# Patient Record
Sex: Male | Born: 1945 | Race: White | Hispanic: No | Marital: Married | Smoking: Never smoker
Health system: Southern US, Community
[De-identification: ages and names within clinical notes are randomized; demographics above are authoritative.]

## PROBLEM LIST (undated history)

## (undated) DIAGNOSIS — I4891 Unspecified atrial fibrillation: Secondary | ICD-10-CM

## (undated) DIAGNOSIS — C801 Malignant (primary) neoplasm, unspecified: Secondary | ICD-10-CM

## (undated) DIAGNOSIS — I10 Essential (primary) hypertension: Secondary | ICD-10-CM

## (undated) DIAGNOSIS — R519 Headache, unspecified: Secondary | ICD-10-CM

## (undated) DIAGNOSIS — F419 Anxiety disorder, unspecified: Secondary | ICD-10-CM

## (undated) DIAGNOSIS — E7800 Pure hypercholesterolemia, unspecified: Secondary | ICD-10-CM

## (undated) DIAGNOSIS — R079 Chest pain, unspecified: Secondary | ICD-10-CM

## (undated) DIAGNOSIS — K219 Gastro-esophageal reflux disease without esophagitis: Secondary | ICD-10-CM

## (undated) DIAGNOSIS — R51 Headache: Secondary | ICD-10-CM

## (undated) DIAGNOSIS — A048 Other specified bacterial intestinal infections: Secondary | ICD-10-CM

## (undated) DIAGNOSIS — R002 Palpitations: Secondary | ICD-10-CM

## (undated) DIAGNOSIS — E785 Hyperlipidemia, unspecified: Secondary | ICD-10-CM

## (undated) DIAGNOSIS — Z86010 Personal history of colonic polyps: Secondary | ICD-10-CM

## (undated) DIAGNOSIS — E871 Hypo-osmolality and hyponatremia: Secondary | ICD-10-CM

## (undated) HISTORY — PX: BACK SURGERY: SHX140

## (undated) HISTORY — DX: Palpitations: R00.2

## (undated) HISTORY — DX: Other specified bacterial intestinal infections: A04.8

## (undated) HISTORY — DX: Personal history of colonic polyps: Z86.010

## (undated) HISTORY — DX: Malignant (primary) neoplasm, unspecified: C80.1

## (undated) HISTORY — PX: EYE SURGERY: SHX253

## (undated) HISTORY — DX: Gastro-esophageal reflux disease without esophagitis: K21.9

## (undated) HISTORY — DX: Chest pain, unspecified: R07.9

## (undated) HISTORY — DX: Anxiety disorder, unspecified: F41.9

## (undated) HISTORY — DX: Hyperlipidemia, unspecified: E78.5

## (undated) SURGERY — Surgical Case
Anesthesia: *Unknown

---

## 1998-05-14 ENCOUNTER — Inpatient Hospital Stay (HOSPITAL_COMMUNITY): Admission: EM | Admit: 1998-05-14 | Discharge: 1998-05-16 | Payer: Self-pay | Admitting: Internal Medicine

## 1998-05-16 ENCOUNTER — Encounter: Payer: Self-pay | Admitting: Internal Medicine

## 2001-01-31 ENCOUNTER — Ambulatory Visit (HOSPITAL_COMMUNITY): Admission: RE | Admit: 2001-01-31 | Discharge: 2001-01-31 | Payer: Self-pay | Admitting: Otolaryngology

## 2001-01-31 ENCOUNTER — Encounter: Payer: Self-pay | Admitting: Otolaryngology

## 2003-12-01 ENCOUNTER — Ambulatory Visit (HOSPITAL_COMMUNITY): Admission: RE | Admit: 2003-12-01 | Discharge: 2003-12-01 | Payer: Self-pay | Admitting: Internal Medicine

## 2004-01-24 ENCOUNTER — Ambulatory Visit: Payer: Self-pay | Admitting: Internal Medicine

## 2005-01-23 ENCOUNTER — Ambulatory Visit (HOSPITAL_COMMUNITY): Admission: RE | Admit: 2005-01-23 | Discharge: 2005-01-23 | Payer: Self-pay | Admitting: Otolaryngology

## 2005-03-19 HISTORY — PX: LUMBAR DISC SURGERY: SHX700

## 2005-03-20 ENCOUNTER — Ambulatory Visit: Payer: Self-pay | Admitting: Internal Medicine

## 2005-04-27 ENCOUNTER — Encounter (INDEPENDENT_AMBULATORY_CARE_PROVIDER_SITE_OTHER): Payer: Self-pay | Admitting: Internal Medicine

## 2005-04-27 ENCOUNTER — Ambulatory Visit (HOSPITAL_COMMUNITY): Admission: RE | Admit: 2005-04-27 | Discharge: 2005-04-27 | Payer: Self-pay | Admitting: Internal Medicine

## 2005-04-27 ENCOUNTER — Ambulatory Visit: Payer: Self-pay | Admitting: Internal Medicine

## 2005-04-27 DIAGNOSIS — Z860100 Personal history of colon polyps, unspecified: Secondary | ICD-10-CM

## 2005-04-27 HISTORY — DX: Personal history of colonic polyps: Z86.010

## 2005-04-27 HISTORY — DX: Personal history of colon polyps, unspecified: Z86.0100

## 2005-07-03 ENCOUNTER — Emergency Department (HOSPITAL_COMMUNITY): Admission: EM | Admit: 2005-07-03 | Discharge: 2005-07-03 | Payer: Self-pay | Admitting: Emergency Medicine

## 2005-07-10 ENCOUNTER — Ambulatory Visit (HOSPITAL_COMMUNITY): Admission: RE | Admit: 2005-07-10 | Discharge: 2005-07-10 | Payer: Self-pay | Admitting: Unknown Physician Specialty

## 2005-07-23 ENCOUNTER — Inpatient Hospital Stay (HOSPITAL_COMMUNITY): Admission: EM | Admit: 2005-07-23 | Discharge: 2005-07-27 | Payer: Self-pay | Admitting: Emergency Medicine

## 2005-08-11 ENCOUNTER — Emergency Department (HOSPITAL_COMMUNITY): Admission: EM | Admit: 2005-08-11 | Discharge: 2005-08-11 | Payer: Self-pay | Admitting: Emergency Medicine

## 2005-08-20 ENCOUNTER — Ambulatory Visit (HOSPITAL_COMMUNITY): Admission: RE | Admit: 2005-08-20 | Discharge: 2005-08-21 | Payer: Self-pay | Admitting: Neurosurgery

## 2005-09-26 ENCOUNTER — Encounter (HOSPITAL_COMMUNITY): Admission: RE | Admit: 2005-09-26 | Discharge: 2005-10-26 | Payer: Self-pay | Admitting: Neurosurgery

## 2005-10-24 ENCOUNTER — Ambulatory Visit (HOSPITAL_COMMUNITY): Admission: RE | Admit: 2005-10-24 | Discharge: 2005-10-24 | Payer: Self-pay | Admitting: Urology

## 2005-11-01 ENCOUNTER — Encounter (HOSPITAL_COMMUNITY): Admission: RE | Admit: 2005-11-01 | Discharge: 2005-12-01 | Payer: Self-pay | Admitting: Neurosurgery

## 2006-10-17 ENCOUNTER — Ambulatory Visit (HOSPITAL_COMMUNITY): Admission: RE | Admit: 2006-10-17 | Discharge: 2006-10-17 | Payer: Self-pay | Admitting: Internal Medicine

## 2007-04-28 LAB — HM COLONOSCOPY

## 2008-11-23 DIAGNOSIS — Z8719 Personal history of other diseases of the digestive system: Secondary | ICD-10-CM | POA: Insufficient documentation

## 2008-11-23 DIAGNOSIS — R1013 Epigastric pain: Secondary | ICD-10-CM | POA: Insufficient documentation

## 2008-11-23 DIAGNOSIS — D126 Benign neoplasm of colon, unspecified: Secondary | ICD-10-CM | POA: Insufficient documentation

## 2008-11-23 DIAGNOSIS — K3189 Other diseases of stomach and duodenum: Secondary | ICD-10-CM | POA: Insufficient documentation

## 2008-11-23 DIAGNOSIS — R630 Anorexia: Secondary | ICD-10-CM | POA: Insufficient documentation

## 2008-11-23 DIAGNOSIS — K589 Irritable bowel syndrome without diarrhea: Secondary | ICD-10-CM | POA: Insufficient documentation

## 2008-11-24 ENCOUNTER — Ambulatory Visit: Payer: Self-pay | Admitting: Gastroenterology

## 2008-11-25 ENCOUNTER — Encounter: Payer: Self-pay | Admitting: Gastroenterology

## 2009-01-25 ENCOUNTER — Encounter: Payer: Self-pay | Admitting: Gastroenterology

## 2009-02-19 ENCOUNTER — Encounter: Admission: RE | Admit: 2009-02-19 | Discharge: 2009-02-19 | Payer: Self-pay | Admitting: Neurosurgery

## 2009-08-09 ENCOUNTER — Ambulatory Visit (HOSPITAL_COMMUNITY): Admission: RE | Admit: 2009-08-09 | Discharge: 2009-08-09 | Payer: Self-pay | Admitting: Urology

## 2010-04-09 ENCOUNTER — Encounter: Payer: Self-pay | Admitting: Urology

## 2010-04-10 ENCOUNTER — Encounter: Payer: Self-pay | Admitting: Urology

## 2010-05-05 ENCOUNTER — Encounter (INDEPENDENT_AMBULATORY_CARE_PROVIDER_SITE_OTHER): Payer: Self-pay

## 2010-05-10 NOTE — Letter (Signed)
Summary: Recall Colonoscopy/Endoscopy, Change to Office Visit  [facility]  [address]   [place], [place] [address]   Phone: [phone]  Fax: [phone]      May 05, 2010   [facility] [address] [place], [place]  [address] Jun 21, 1945   Dear Mr. Brazel,   According to our records, it is time for you to schedule a Colonoscopy/Endoscopy. However, after reviewing your medical record, we recommend an office visit in order to determine your need for a repeat procedure.  Please call [phone] at your convenience to schedule an office visit. If you have any questions or concerns, please feel free to contact our office.   Sincerely,   Cloria Spring LPN  [facility] Ph: [phone]   Fax: [phone]

## 2010-08-04 NOTE — Op Note (Signed)
[name], [name]                 ACCOUNT NO.:  [id number]   MEDICAL RECORD NO.:  [id number]          PATIENT TYPE:  AMB   LOCATION:  DAY                           FACILITY:  [facility]   PHYSICIAN:  Lionel December, M.D.    DATE OF BIRTH:  May 10, 1945   DATE OF PROCEDURE:  04/27/2005  DATE OF DISCHARGE:                                 OPERATIVE REPORT   PROCEDURE:  Colonoscopy with polypectomy.   INDICATIONS:  [name] is a 65 year old Caucasian male who is undergoing  colonoscopy for surveillance purposes. He is presently free of GI symptoms.  His last exam was six years ago. He had a few small polyps. The ones that  were removed were hyperplastic. Histology was not available on the rest. He  also has history of testicular carcinoma. Procedure and risks were reviewed  with the patient, and informed consent was obtained.   MEDICINES FOR CONSCIOUS SEDATION:  Demerol 50 mg IV, Versed 6 mg IV.   FINDINGS:  Procedure performed in endoscopy suite. The patient's vital signs  and O2 saturations were monitored during procedure and remained stable. The  patient was placed in the left lateral position and rectal examination  performed. No abnormality noted on external or digital exam. Olympus video  scope was placed in the rectum and advanced into sigmoid colon and beyond.  Preparation was excellent. Scope was advanced to the cecum which was  identified by appendiceal orifice and ileocecal valve. There was a 4-mm  polyp to the right of appendiceal orifice which was ablated by cold biopsy.  As the scope was withdrawn, colonic mucosa was carefully examined. There was  another 6- to 7-mm polyp at distal sigmoid colon which was snared and  retrieved. The mucosal of the rest of the colon was normal. The rectal  mucosa similarly was normal. Scope was retroflexed to examine anorectal  junction, and small hemorrhoids were noted below the dentate line. Endoscope  was straightened and withdrawn. The patient  tolerated the procedure well.   FINAL DIAGNOSES:  1.  Small cecal polyp ablated via cold biopsy.  2.  A 6- to 7-mm polyp snared from sigmoid colon. Both of these polyps      appeared to be edematous.  3.  External hemorrhoids.   RECOMMENDATIONS:  Standard instructions given. I will be contacting the  patient with biopsy results and further recommendations.      Lionel December, M.D.  Electronically Signed     NR/MEDQ  D:  04/27/2005  T:  04/27/2005  Job:  [id number]   cc:   Doreen Beam  Fax: [phone]

## 2010-08-04 NOTE — Op Note (Signed)
[name], [name]                           ACCOUNT NO.:  [id number]   MEDICAL RECORD NO.:  [id number]                   PATIENT TYPE:  AMB   LOCATION:  DAY                                  FACILITY[facility]  [facility]   PHYSICIAN:  Lionel December, M.D.                 DATE OF BIRTH:  02-13-1946   DATE OF PROCEDURE:  12/01/2003  DATE OF DISCHARGE:                                 OPERATIVE REPORT   PROCEDURE:  Esophagogastroduodenoscopy.   INDICATIONS:  Benett is a 65 year old Caucasian male with recurrent  epigastric pain. He has been seen in the office twice this year. He had  normal LFTs and upper abdominal ultrasound. He has been treated with PPI and  antispasmodic with questionable results. Lately, he has been having more  pain and soreness and bloating. He therefore undergoing diagnostic EGD.  Procedure risks were reviewed with the patient and informed consent was  obtained.   PREOPERATIVE MEDICATIONS:  Cetacaine spray for pharyngeal topical  anesthesia, Demerol 50 mg IV, Versed 6 mg in divided doses.   FINDINGS:  Procedure performed in endoscopy suite. The patient's vital signs  and O2 saturations were monitored during the procedure and remained stable.  The patient was placed in left lateral position, and Olympus video scope was  passed via oropharynx without any difficulty into the esophagus.   Esophagus:  Mucosa of the esophagus was normal throughout. GE junction was  unremarkable. No hernia was noted.   Stomach:  It was empty and distended very well with insufflation. Folds of  the proximal stomach were normal. Some focal erythema at gastric body but no  erosions or ulcers noted. Antral mucosa was normal. Pyloric channel was  patent. Angularis, fundus and cardia were examined by retroflexing the scope  and were normal.   Duodenum:  Reached the bulb, and post bulbar duodenum was normal. Mucosa and  folds in the second part of the duodenum were normal. Endoscope was  withdrawn. The  patient tolerated the procedure well.   FINAL DIAGNOSIS:  Focal erythema at gastric body, otherwise normal  examination. 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 Please note his  helicobacter pylori serology was negative in October 2003 and will not be  repeated.   RECOMMENDATIONS:  1.  Trial with Librax 1 p.o. t.i.d. p.r.n. If this therapy does not help,      may consider anxiolytic therapy on scheduled basis rather than p.r.n.      use. Prescription given for Librax for 40 with 1 refill.  2.  He will return for OV in 2 months from now.      ___________________________________________                                            Lionel December, M.D.   NR/MEDQ  D:  12/01/2003  T:  12/01/2003  Job:  [id number]   cc:   Doreen Beam  [address]  [place]  [place] [address]  Fax: [phone]   Rito Ehrlich, M.D.  [place], [place]

## 2010-08-04 NOTE — H&P (Signed)
NAMESONG, [name]                 ACCOUNT NO.:  [id number]   MEDICAL RECORD NO.:  [id number]          PATIENT TYPE:  AMB   LOCATION:                                FAC[facility]:  [facility]   PHYSICIAN:  Lionel December, M.D.    DATE OF BIRTH:  [birth year]   DATE OF ADMISSION:  DATE OF DISCHARGE:  LH                                HISTORY & PHYSICAL   PRIMARY CARE PHYSICIAN:  Dr. Sherril Croon.   CHIEF COMPLAINT:  Annual followup, due for colonoscopy.   HISTORY OF PRESENT ILLNESS:  [name] is a 65 year old Caucasian male who  we have seen for chronic dyspepsia.  He has a history of testicular  seminoma.  He had colonoscopy by Dr. Karilyn Cota on April 12, 1999; he had  external hemorrhoids, tiny diverticula in the sigmoid colon and a lipoma at  the hepatic flexure and 4 small polyps which were hyperplastic.  Due to his  past history and the fact that one of the hyperplastic polyps had an  ulcerative surface, Dr. Karilyn Cota recommended a colonoscopy in 2006.  [name]  continues to do well.  His weight has remained stable.  He has a good  appetite.  He denies any early satiety, denies any dysphagia or odynophagia.  He does have rare heartburn and indigestion about once every 2-3 months.  He  has a history of IBS.  His bowel movements have been normal, soft and brown,  without any rectal bleeding or melena.  He denies any diarrhea or  constipation.  He does take an occasional Protonix 40 mg daily, but very  rarely, a couple of times a year.  He notes certain foods make his dyspepsia  worse, including orange juice and caffeine.  He had physical exam by Dr.  Sherril Croon about a year ago.  He is followed by Dr. Rito Parker for his history of  testicular carcinoma.   CURRENT MEDICATIONS:  1.  Lipitor 10 mg daily.  2.  Protonix 40 mg daily.  3.  A blood pressure medicines (he is going to call back with the name and      dose).  4.  Questionable heart medication -- patient to call with name and dose.   ALLERGIES:   PREVACID, which caused oral swelling; VALIUM, which caused  hyperactivity.   PAST MEDICAL HISTORY:  1.  Hypercholesterolemia.  2.  History of IBS.  3.  History of chronic dyspepsia.  4.  History of left orchiectomy, followed by radiation therapy for a      testicular seminoma and he has remained in remission.  He is followed by      Dr. Rito Parker.  5.  He also has a history of gynecomastia due to one of his medications.  He      underwent bilateral mastectomy by Dr. Lovell Sheehan.  6.  He has a history of palpitations and tachycardia for which he has      recently been started on a new medication by Dr. Sherril Croon; the patient does      not recall the name of this.  FAMILY HISTORY:  There is no known family history of colorectal carcinoma or  other chronic GI problems.   SOCIAL HISTORY:  [name] is married.  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  He has a 15-pack-year history of tobacco use,  quitting about 25 years ago.  He denies any drug or alcohol use.   REVIEW OF SYSTEMS:  CONSTITUTIONAL:  See HPI.  CARDIOVASCULAR:  Denies any  chest pain.  He has had a recent history of palpitations and is followed by  Dr. Sherril Croon for this.  Denies any diaphoresis or chest pain with this.  CARDIOVASCULAR:  Denies any shortness of breath, dyspnea, cough or  hemoptysis.  GI:  See HPI.   PHYSICAL EXAMINATION:  VITAL SIGNS:  Weight 155 pounds, height 68 inches,  temperature 97.8, blood pressure 146/84 and pulse 76.  GENERAL:  [name] is a thin Caucasian male who is alert, oriented and  pleasant, cooperative and somewhat anxious-appearing and he is in no acute  distress.  HEENT:  Sclerae are clear not injected.  Conjunctivae are pink.  Oropharynx  pink and moist without any lesions.  NECK:  Supple without mass or thyromegaly.  HEART:  Regular rate and rhythm.  Normal S1 and S2.  Without murmurs, rubs,  thrills or gallops.  LUNGS:  Clear to auscultation bilaterally.  ABDOMEN:  Flat  with positive bowel sounds x4.  No bruits auscultated.  Soft,  nontender and non-distended without palpable mass or hepatosplenomegaly.  No  rebound tenderness or guarding.  EXTREMITIES:  Without clubbing or edema bilaterally.  SKIN:  Pink, warm and dry without any rash or jaundice.   IMPRESSION:  [name] is a 65 year old Caucasian male with history of  testicular seminoma.  He also has a history of hyperplastic polyps, one with  an ulcerative surface on colonoscopy back in 2001.  It was recommended by  Dr. Karilyn Cota that he have a colonoscopy last year; however, [name] has not  gotten around to this.  He is going to need screening colonoscopy soon.   His chronic dyspepsia is well-controlled, as is his irritable bowel syndrome  at this time.   PLAN:  1.  He is going to call to schedule colonoscopy with Dr. Karilyn Cota in the near      future.  I have discussed this procedure including risks and benefits to      include, but not limited to, bleeding, infection, perforation, drug      reaction.  He agrees to plan and consent will be obtained.  2.  GERD literature was given for his review.  3.  He is due for annual physical exam with Dr. Sherril Croon and he is going to      schedule this.  4.  He can continue Protonix 40 mg on a p.r.n. basis.      Nicholas Lose, N.P.      Lionel December, M.D.  Electronically Signed    KC/MEDQ  D:  03/20/2005  T:  03/20/2005  Job:  [id number]   cc:   Doreen Beam  Fax: [phone]

## 2010-08-04 NOTE — Discharge Summary (Signed)
[name], [name]                 ACCOUNT NO.:  [id number]   MEDICAL RECORD NO.:  [id number]          PATIENT TYPE:  INP   LOCATION:  A309                          FACILITY:  [facility]   PHYSICIAN:  Mobolaji B. Bakare, M.D.DATE OF BIRTH:  09-05-1945   DATE OF ADMISSION:  07/22/2005  DATE OF DISCHARGE:  07/27/2005                                 DISCHARGE SUMMARY   PRIMARY CARE PHYSICIAN:  Unassigned.   FINAL DIAGNOSES:  1.  Hyponatremia.  2.  Hypertension.  3.  Dehydration.  4.  Hypokalemia/hyperkalemia.  5.  Herniated disc, to follow up with neurosurgeon.   PROCEDURE:  1.  Head CT scan done on Jul 26, 2005, showed no acute intracranial      abnormalities, no evidence of metastasis.  2.  Chest CT scan which was done to follow up chest x-ray finding of      question of nodule in left upper lobe.  There was no nodule or      malignancy noted.  There is a vague lytic focus which was circumscribed,      sclerotic ring in the posterior left rib which corresponded to the chest      x-ray finding.  3.  Chest x-ray showed minimal bronchitic changes with questionable left      upper lobe nodular density.  This was followed with a CT scan of the      chest.   HISTORY OF PRESENTING COMPLAINT:  [name] is a 65 year old Caucasian male  who presented with lethargy and worsening and progressive weakness all over  the body for four weeks.  He had poor appetite.  This was all sequel to a  viral syndrome which presented with nausea and vomiting.  This resolved,  then patient became progressively lethargic.  He was noted in the emergency  room to have a sodium of 122.  There was no seizures and no focal weakness.  He was admitted into the intensive care unit and started on hypertonic  saline.  He was further evaluated for hyponatremia. Investigation so far did  not reveal any substantive etiology other than poor p.o. intake.  He had a  chest x-ray and CT scan which were unremarkable.  Head CT  scan was not  suggestive of brain metastasis.  Patient responded to normal saline IV fluid  and at the time of discharge, sodium was 138.   HYPERKALEMIA/HYPOKALEMIA:  He had episodes of hypokalemia and this was  repleted.  Subsequently he became hyperkalemic and IV fluid replacement with  potassium was discontinued.  Further potassium replacement was held.   LETHARGY AND WEAKNESS:  Patient was seen by physical therapist and he did  well with physical therapy.  He was able to ambulate with a cane.  There was  no further physical therapy needed at home.   HERNIATED DISC:  As part of his evaluation during previous hospital visit in  April 2007, he was noted to have herniated disc on left at level L4 to L5.  There was mild defect on L4 nerve root.  Symptomatically, patient had  numbness  at that time.  During this hospitalization, this was followed up.  He did not have any neurological weakness, no loss of sensation.  He did  have numbness and tingling sensation on the lateral aspect of his leg and  proximal dorsum of left foot.  Subsequent to physical therapy, patient  stated that this has improved.  Nevertheless, he was set up to follow up  with neurosurgery in [place], [place].   CONDITION ON DISCHARGE:  Stable and improved.   DISCHARGE MEDICATIONS:  1.  Lipitor 10 mg daily.  2.  Metoprolol 50 mg daily.  3.  Lisinopril 20 mg daily.  4.  Protonix 40 mg p.r.n.  5.  Diclofenac sodium 50 mg b.i.d.  6.  Darvocet N 100 p.o. p.r.n.   FOLLOW UP:  1.  Patient to follow up with PCP in one week and have BMET rechecked.  2.  Follow-up with neurosurgeon at [facility].  Telephone      number provided.   DISCHARGE LABORATORY DATA:  Sodium 128, BUN 5, creatinine 0.4, potassium 5.3  (patient's potassium replacement was discontinued).  Serum osmolality 283.  Urine osmolality 303.  Hemoglobin A1c 5.8.  Cortisone level 6.5.      Mobolaji B. Corky Downs, M.D.  Electronically  Signed     MBB/MEDQ  D:  07/29/2005  T:  07/30/2005  Job:  [id number]

## 2010-08-04 NOTE — H&P (Signed)
[name], [name]                 ACCOUNT NO.:  [id number]   MEDICAL RECORD NO.:  [id number]          PATIENT TYPE:  INP   LOCATION:  IC09                          FACILITY:  [facility]   PHYSICIAN:  Margaretmary Dys, M.D.DATE OF BIRTH:  [birth year]   DATE OF ADMISSION:  07/23/2005  DATE OF DISCHARGE:  LH                                HISTORY & PHYSICAL   ADMISSION DIAGNOSES:  1.  Severe hyponatremia.  2.  Generalized weakness.  3.  Post-viral syndrome.  4.  Severe dehydration.  5.  Poorly-controlled hypertension.   CHIEF COMPLAINT:  Progressive weakness of four weeks' duration.   HISTORY OF PRESENT ILLNESS:  [name] is a 65 year old Caucasian male who  presented to the emergency room early this morning with complaints of  progressive generalized weakness, lethargy, and very poor appetite over the  past four weeks.  The patient says he had been progressively sick and has  never really full recovered from a viral infection he had.  About four weeks  ago, he had good poisoning, and he suspected that he may have had a  Norovirus infection.  The patient said it was after he had a sandwich in  [employer].  He is a Medical illustrator.  The patient was accompanied on this visit by  his wife. He felt very tired, and he could hardly talk to me.  He denies any  chest pain or shortness of breath.  He just has muscle weakness globally.  He has had no headaches, no dizziness, no visual changes.  His nausea and  vomiting has really resolved, and they resolved within a week of his  symptoms then, but he has had lethargy and weakness ever since.   He denies any night sweats and no fevers.  The patient said he must have  lost some weight because her clothes are now a little more loose, and wife  also agrees, but he does not know his actual weight loss.  He denies any  hemoptysis.   Evaluation in the emergency room revealed him to be severely dehydrated.  He  was also noted to be severely hyponatremic with  a low sodium here in the  emergency room.  The patient is now being admitted for hydration and further  management.   REVIEW OF SYSTEMS:  A 10-point review of systems is otherwise negative  except as mentioned in History of Present Illness above.   PAST MEDICAL HISTORY:  1.  History of testicular cancer in 1995.  The patient is cared for by      urology and is essentially cured.  2.  History of gastroesophageal reflux disease.  3.  Dyslipidemia.  4.  Hypertension.   MEDICATIONS:  1.  Lipitor 10 mg p.o. once a day.  2.  Metoprolol 50 mg p.o. once a day.  3.  Lisinopril 20 mg p.o. once a day.  4.  Protonix 40 mg p.o. once a day.  5.  Baclofen 50 mg p.o. b.i.d.  6.  Darvocet-N 100 1 p.o. q.4 h. p.r.n.Marland Kitchen   ALLERGIES:  PREVACID and VALIUM.   FAMILY  HISTORY:  Positive for coronary artery disease and hypertension.  No  history of peptic ulcer disease or inflammatory bowel disease.   SOCIAL HISTORY:  The patient is married, does not smoke or drink alcohol,  lives at home with his wife.   He is a Catering manager and travels quite frequently.   PHYSICAL EXAMINATION:  GENERAL:  The patient was conscious, alert, but  lethargic, well oriented in time, place, and person.  He was quite  dehydrated.  VITAL SIGNS:  Blood pressure on arrival was 154/88, pulse of 86,  respirations of 22, temperature 98.5, oxygen saturation 99% on room air.  At  some point, his blood pressure went up as high as 196/88.  HEENT EXAM:  Normocephalic, atraumatic.  Oral mucosa was dry. No exudates or  thrush was noted.  NECK:  Supple, no JVD, no lymphadenopathy.  LUNGS:  Clear clinically with good air entry bilaterally.  HEART:  S1 and S2 regular, no  S3, S4, gallops, or rubs.  ABDOMEN:  Soft, nontender, bowel sounds positive, no mass palpable.  EXTREMITIES:  No pitting pedal edema.  No calf induration or tenderness was  noted.  CNS SYSTEM:  Grossly intact with no focal deficits.   LABORATORY/DIAGNOSTIC  DATA:  White blood cell count was 15.2, hemoglobin of  14, hematocrit 42.  Platelet count was 432,000.  His sodium was 112,  potassium 4.8, chloride 82, CO2 21, glucose 163, BUN 7, creatinine 0.9, and  calcium was 9.1.  Urinalysis was negative.  Urine microscopy was  unremarkable.   Chest x-ray is pending.   ASSESSMENT AND PLAN:  [name] is a 65 year old Caucasian male presenting  with worsening progress weakness and lethargy with a poor appetite over the  past four weeks.  This was subsequent to a viral syndrome he had back then  characterized by nausea and vomiting.  I suspect the patient still had some  degree of post-viral syndrome left.  He still feels ill, and he is  dehydrated.  He is also hyponatremic.  The plan will be to hydrate him. I  will admit him to ICU because of the risk of seizure due to his sodium  level.  We will replace with hypertonic saline but switch him to normal  saline once his sodium is 122.   We will resume all his home medications at this time and hold his blood  pressure medication based on parameters.  We initiated neurologic checks for  him q.4 for the next 24 hours.   We will monitor his basic metabolic profile every four hours.  I will send  off a hepatitis panel to rule out possibility of this being an acute viral  infection for hepatitis A, B, or C.   I discussed the above plan with the patient, and he verbalized full  understanding.  DVT prophylaxis will be with Lovenox, GI prophylaxis with  Protonix.      Margaretmary Dys, M.D.  Electronically Signed     AM/MEDQ  D:  07/23/2005  T:  07/23/2005  Job:  [id number]   cc:   Timmothy Euler, M.D.  [place], [place]

## 2010-08-04 NOTE — Op Note (Signed)
NAMEJAYSHON, [name]                 ACCOUNT NO.:  [id number]   MEDICAL RECORD NO.:  [id number]          PATIENT TYPE:  OIB   LOCATION:  3034                         FACILITY:  [facility]   PHYSICIAN:  Donalee Citrin, M.D.        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   PROCEDURE:  Extraforaminal diskectomy, L4-5 left, with microscopic  dissection of the left L4 nerve root and microscopic diskectomy.   SURGEON:  Donalee Citrin, M.D.   ANESTHESIA:  General endotracheal.   HISTORY OF PRESENT ILLNESS:  The patient is a very pleasant 65 year old  gentleman who has had longstanding back and predominantly left leg pain  radiating down through his hip, down the outside of his left thigh, down the  front of his left shin.  The patient had some weakness on dorsiflexion on  exam, and MRI scan showed a very large extraforaminal disk herniation, L4-5  left.  The patient was in excruciating radicular pain.  The patient was  recommended an extraforaminal approach to diskectomy, risks and benefits  were discussed with the patient, and he understands and agrees to proceed  forward.   The patient was brought to the OR, was induced under general anesthesia,  positioned prone on the Wilson frame.  The back was prepped and draped in  the usual sterile fashion.  A preop x-ray localized the L5 pedicle so after  infiltration of anesthesia of lidocaine with epinephrine, a midline incision  was made just slightly superior to this and Bovie electrocautery was used to  take down the subcutaneous tissues and subperiosteal dissection carried out  in the lamina of L3, L4 and part of L5, exposing the L3-4 facet and the L4  TP.  Intraoperative x-ray confirmed localization of the probe on the L4 TP.  Then inferior to this the  inferior aspect of that facet complex, the lateral  aspect of the pars and superior aspect of the L4-5 facet was drilled down.  Then using a #4 Penfield, the undersurface of the facet and lateral pars was  developed and teased away from the intertransverse ligament.  This was all  underbitten with a 2 mm Kerrison punch.  At this point the operating  microscope was draped and brought into the field and under microscopic  illumination, the remainder of the extraforaminal compartment was further  widened with a little more drilling in the superior aspect of the L4-5 facet  and the lateral aspect of the pars.  Then again this was underbitten with a  2 mm Kerrison punch, exposing the intertransverse ligament, which was  removed in piecemeal fashion to expose the L4 nerve root.  The L4 nerve root  was noted to be markedly displaced dorsally and flattened against a very  large disk herniation, partially still containing the ligament inferiorly.  So after the interspace was identified, epidural veins were coagulated.  The  superior facet of L4-5 was underbitten to expose more of the inferior aspect  of the disk space.  Annulotomy was made with a 10 blade scalpel.  After  this, using a blunt nerve hook and coronary dilator, extending out the  undersurface of the L4 root laterally, several very large free fragments of  disk were immediately expressed and removed in piecemeal fashion with a 2 mm  Kerrison punch.  After these disks were removed, the L4 nerve root resumed  more of its anatomic location.  The remainder of the disk space was entered,  cleaned out with pituitary rongeurs and then the nerve both proximally and  distally was explored with a blunt nerve hook.  Several more fragments were  teased away.  Some of the inferior end plate of L4 was pushed down with an  Epstein to create more space for the L4 nerve root.  The undersurface of the  pars and lateral facet, disk material was expressed  from this location as  well and at the end of the diskectomy, there was no further stenosis in the  L4 root.  The wound was then copiously irrigated, meticulous hemostasis was  maintained.  Depo-Medrol was placed over the top of the L4 root.  Gelfoam  was applied.  The wound was closed in layers with interrupted Vicryl and a  running 4-0 subcuticular on the skin.  Benzoin and Steri-Strips were  applied.  The patient was taken to the recovery room in stable condition.  At the end of the case all needle and sponge counts were correct.           ______________________________  Donalee Citrin, M.D.     GC/MEDQ  D:  08/20/2005  T:  08/21/2005  Job:  [id number]

## 2010-10-04 ENCOUNTER — Encounter (INDEPENDENT_AMBULATORY_CARE_PROVIDER_SITE_OTHER): Payer: Self-pay

## 2010-10-04 DIAGNOSIS — E785 Hyperlipidemia, unspecified: Secondary | ICD-10-CM | POA: Insufficient documentation

## 2010-10-04 DIAGNOSIS — K219 Gastro-esophageal reflux disease without esophagitis: Secondary | ICD-10-CM | POA: Insufficient documentation

## 2010-10-04 DIAGNOSIS — F419 Anxiety disorder, unspecified: Secondary | ICD-10-CM | POA: Insufficient documentation

## 2010-10-23 ENCOUNTER — Ambulatory Visit (INDEPENDENT_AMBULATORY_CARE_PROVIDER_SITE_OTHER): Payer: BC Managed Care – PPO | Admitting: Internal Medicine

## 2010-10-23 ENCOUNTER — Encounter (INDEPENDENT_AMBULATORY_CARE_PROVIDER_SITE_OTHER): Payer: Self-pay | Admitting: Internal Medicine

## 2010-10-23 DIAGNOSIS — R197 Diarrhea, unspecified: Secondary | ICD-10-CM

## 2010-10-23 DIAGNOSIS — K529 Noninfective gastroenteritis and colitis, unspecified: Secondary | ICD-10-CM | POA: Insufficient documentation

## 2010-10-23 DIAGNOSIS — R1013 Epigastric pain: Secondary | ICD-10-CM

## 2010-10-23 NOTE — Progress Notes (Signed)
Office visit   Presenting complaint; recent bout with diarrhea and epigastric pain.  Subjective; patient is a 65 year old male patient of Dr.Vyas Who is in for scheduled visit. He developed diarrhea about 10 weeks ago but it only lasted one day when he had at least 5 bowel movements. He does not recall but he had nausea vomiting fever or rectal bleeding. He is now having normal bowel movements. However he is also been experiencing epigastric pain; he saw Dr. Sherril Croon and was given a prescription for Carafate which has helped a great deal and now he has mild pain every now and then. His heartburn is well controlled at ppi and he denies dysphagia. He sees Dr. 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 [name] has history of colonic adenoma and he is due for colonoscopy next month. Current medications; Current Outpatient Prescriptions on File Prior to Visit  Medication Sig Dispense Refill  . aspirin 81 MG tablet Take 81 mg by mouth. As Needed       . Atorvastatin Calcium (LIPITOR PO) Take 10 mg by mouth at bedtime.       Marland Kitchen lisinopril (PRINIVIL,ZESTRIL) 20 MG tablet Take 10 mg by mouth daily.       . pantoprazole (PROTONIX) 40 MG tablet Take 40 mg by mouth daily.        . Carafate liquid 1 g 4 times a day Objective. BP 128/78  Pulse 74  Temp(Src) 97.6 F (36.4 C) (Oral)  Ht 5\' 8"  (1.727 m)  Wt 149 lb (67.586 kg)  BMI 22.66 kg/m2 Conjunctiva is pink. Sclerae nonicteric. Oropharyngeal mucosa is normal. He has dentures. No neck masses or thyromegaly noted. Cardiac exam regular rhythm normal S1 and S2. Lungs are clear to auscultation. His abdomen is symmetrical; bowel sounds are normal; No hepatosplenomegaly or masses noted; he has mild mid epigastric tenderness No peripheral edema. Assessment; #1. Diarrhea; self-limiting bout. No need for further workup unless it recurs. #2. Epigastric pain; it has improved with  Carafate; patient is already on pantoprazole which he takes primarily for GERD. #3. History of colonic adenoma he will have his next colonoscopy in September 2012. Plan; Patient reassured. Continue  pantoprazole as before. Take Carafate on an as needed basis. Colonoscopy as planned.

## 2010-10-23 NOTE — Patient Instructions (Addendum)
Take sucralfate as needed.

## 2010-12-05 MED ORDER — SODIUM CHLORIDE 0.45 % IV SOLN: Freq: Once | INTRAVENOUS | Status: DC

## 2010-12-06 ENCOUNTER — Other Ambulatory Visit (INDEPENDENT_AMBULATORY_CARE_PROVIDER_SITE_OTHER): Payer: Self-pay | Admitting: Internal Medicine

## 2010-12-06 ENCOUNTER — Ambulatory Visit (HOSPITAL_COMMUNITY)
Admission: RE | Admit: 2010-12-06 | Discharge: 2010-12-06 | Disposition: A | Discharge status: Home / Self Care | Payer: BC Managed Care – PPO | Source: Ambulatory Visit | Attending: Internal Medicine | Admitting: Internal Medicine

## 2010-12-06 ENCOUNTER — Encounter (HOSPITAL_COMMUNITY): Payer: Self-pay

## 2010-12-06 ENCOUNTER — Encounter (INDEPENDENT_AMBULATORY_CARE_PROVIDER_SITE_OTHER): Payer: Self-pay | Admitting: Internal Medicine

## 2010-12-06 ENCOUNTER — Encounter (HOSPITAL_COMMUNITY)
Admission: RE | Disposition: A | Discharge status: Home / Self Care | Payer: Self-pay | Source: Ambulatory Visit | Attending: Internal Medicine

## 2010-12-06 DIAGNOSIS — Z860100 Personal history of colon polyps, unspecified: Secondary | ICD-10-CM | POA: Insufficient documentation

## 2010-12-06 DIAGNOSIS — E785 Hyperlipidemia, unspecified: Secondary | ICD-10-CM | POA: Insufficient documentation

## 2010-12-06 DIAGNOSIS — K644 Residual hemorrhoidal skin tags: Secondary | ICD-10-CM

## 2010-12-06 DIAGNOSIS — Z1211 Encounter for screening for malignant neoplasm of colon: Secondary | ICD-10-CM

## 2010-12-06 DIAGNOSIS — D126 Benign neoplasm of colon, unspecified: Secondary | ICD-10-CM

## 2010-12-06 HISTORY — PX: COLONOSCOPY: SHX5424

## 2010-12-06 SURGERY — COLONOSCOPY
Anesthesia: Moderate Sedation

## 2010-12-06 MED ORDER — MIDAZOLAM HCL 5 MG/5ML IJ SOLN
INTRAMUSCULAR | Status: AC
  Filled 2010-12-06: qty 10

## 2010-12-06 MED ORDER — MEPERIDINE HCL 50 MG/ML IJ SOLN
INTRAMUSCULAR | Status: AC
  Filled 2010-12-06: qty 1

## 2010-12-06 MED ORDER — MEPERIDINE HCL 50 MG/ML IJ SOLN
INTRAMUSCULAR | Status: DC | PRN
  Administered 2010-12-06 (×2): 25 mg via INTRAVENOUS

## 2010-12-06 MED ORDER — SODIUM CHLORIDE 0.45 % IV SOLN
Freq: Once | INTRAVENOUS | Status: AC
  Administered 2010-12-06: 08:00:00 via INTRAVENOUS

## 2010-12-06 MED ORDER — MIDAZOLAM HCL 5 MG/5ML IJ SOLN
INTRAMUSCULAR | Status: DC | PRN
  Administered 2010-12-06 (×3): 2 mg via INTRAVENOUS

## 2010-12-06 NOTE — H&P (Signed)
[name] is an 65 y.o. male.   Chief Complaint: Patient is here for colonoscopy. HPI: Patient is 65 year old Caucasian male with history of colonic polyps. His last exam was in for 2007. He had 6 mm adenoma removed from his sigmoid colon another polyp at cecum was coagulated. Few months ago had self-limited bout of diarrhea. He denies abdominal pain rectal bleeding anorexia or weight loss. Family history is negative for colorectal carcinoma.  Past Medical History  Diagnosis Date  . Cancer     testicular   . Anxiety   . Hyperlipidemia   . H. pylori infection   . GERD (gastroesophageal reflux disease)   . Personal history of colonic polyps 2 9 2007    Past Surgical History  Procedure Date  . Back surgery  in2007, 2 ruptured dics 2007    2 ruptured discs    History reviewed. No pertinent family history. Social History:  reports that he quit smoking about 40 years ago. He has never used smokeless tobacco. He reports that he does not drink alcohol or use illicit drugs.  Allergies:  Allergies  Allergen Reactions  . Diazepam     REACTION: hyperactivity  . Lansoprazole     REACTION: oral swelling    Medications Prior to Admission  Medication Dose Route Frequency Provider Last Rate Last Dose  . 0.45 % sodium chloride infusion   Intravenous Once Malissa Hippo, MD      . 0.45 % sodium chloride infusion   Intravenous Once Malissa Hippo, MD 20 mL/hr at 12/06/10 0749    . meperidine (DEMEROL) 50 MG/ML injection           . midazolam (VERSED) 5 MG/5ML injection            No current outpatient prescriptions on file as of 12/06/2010.    No results found for this or any previous visit (from the past 48 hour(s)). No results found.  Review of Systems  Constitutional: Negative for weight loss.  Gastrointestinal: Negative for abdominal pain, diarrhea, constipation, blood in stool and melena.    Blood pressure 167/92, pulse 105, temperature 98 F (36.7 C), temperature source  Oral, resp. rate 18, height 5\' 8"  (1.727 m), weight 150 lb (68.04 kg), SpO2 99.00%. Physical Exam  Constitutional: He appears well-developed and well-nourished.  HENT:  Mouth/Throat: Oropharynx is clear and moist.  Eyes: Conjunctivae are normal. No scleral icterus.  Neck: No thyromegaly present.  Cardiovascular: Normal rate, regular rhythm and normal heart sounds.   No murmur heard. GI: Soft. He exhibits no distension and no mass. There is no tenderness.  Musculoskeletal: He exhibits no edema.  Lymphadenopathy:    He has no cervical adenopathy.  Neurological: He is alert.  Skin: Skin is warm and dry.     Assessment/Plan History of colonic adenomas. Colonoscopy  REHMAN,NAJEEB U 12/06/2010, 8:39 AM

## 2010-12-06 NOTE — Op Note (Signed)
COLONOSCOPY PROCEDURE REPORT  PATIENT:  [name]  MR#:  [id number] Birthdate:  [birth year], 65 y.o., male Endoscopist:  Dr. Malissa Hippo, MD Referred By:  Dr. Charlesetta Garibaldi, MD Procedure Date: 12/06/2010  Procedure:   Colonoscopy  Indications: History of colonic polyps. Last exam was in February 2007.  Informed Consent: Procedure and risks reviewed with the patient and informed consent was obtained the.  Medications:  Demerol 50 mg IV Versed 6 mg IV  Description of procedure:  After a digital rectal exam was performed, that colonoscope was advanced from the anus through the rectum and colon to the area of the cecum, ileocecal valve and appendiceal orifice. The cecum was deeply intubated. These structures were well-seen and photographed for the record. From the level of the cecum and ileocecal valve, the scope was slowly and cautiously withdrawn. The mucosal surfaces were carefully surveyed utilizing scope tip to flexion to facilitate fold flattening as needed. The scope was pulled down into the rectum where a thorough exam including retroflexion was performed.  Findings:   Prep excellent. 2 small polyps at proximal transverse colon ablated via cold biopsy and submitted in one container. Small hyperplastic appearing polyp at rectosigmoid junction which was left alone. Hemorrhoids below the dentate line.  Therapeutic/Diagnostic Maneuvers Performed:  See above  Complications:  None  Cecal Withdrawal Time:  18 minutes  Impression:  Examination performed to cecum. 2 small polyps ablated via cold biopsy from proximal transverse colon and submitted in one container. External hemorrhoids.  Recommendations:  Standard instructions given. I will be contacting patient with results of biopsy.  REHMAN,NAJEEB U  12/06/2010 9:12 AM  CC: Dr. Ignatius Specking, MD

## 2010-12-12 ENCOUNTER — Encounter (HOSPITAL_COMMUNITY): Payer: Self-pay | Admitting: Internal Medicine

## 2010-12-15 ENCOUNTER — Encounter (INDEPENDENT_AMBULATORY_CARE_PROVIDER_SITE_OTHER): Payer: Self-pay | Admitting: *Deleted

## 2011-03-20 DIAGNOSIS — E871 Hypo-osmolality and hyponatremia: Secondary | ICD-10-CM

## 2011-03-20 HISTORY — DX: Hypo-osmolality and hyponatremia: E87.1

## 2011-06-07 ENCOUNTER — Other Ambulatory Visit: Payer: Self-pay | Admitting: Diagnostic Neuroimaging

## 2011-06-07 DIAGNOSIS — G501 Atypical facial pain: Secondary | ICD-10-CM

## 2011-06-07 DIAGNOSIS — R51 Headache: Secondary | ICD-10-CM

## 2011-10-05 ENCOUNTER — Other Ambulatory Visit: Payer: Self-pay | Admitting: Neurosurgery

## 2011-10-05 DIAGNOSIS — M4800 Spinal stenosis, site unspecified: Secondary | ICD-10-CM

## 2011-10-05 NOTE — Progress Notes (Signed)
1220 pm, pt here for labs draw for MRI scheduled next week. Blood drawn from left antecubital, site unremarkable and pt tolerated procedure well.[email or web address] rn

## 2011-10-09 ENCOUNTER — Ambulatory Visit
Admission: RE | Admit: 2011-10-09 | Discharge: 2011-10-09 | Disposition: A | Discharge status: Home / Self Care | Payer: Medicare Other | Source: Ambulatory Visit | Attending: Neurosurgery | Admitting: Neurosurgery

## 2011-10-09 DIAGNOSIS — M4800 Spinal stenosis, site unspecified: Secondary | ICD-10-CM

## 2011-10-09 MED ORDER — GADOBENATE DIMEGLUMINE 529 MG/ML IV SOLN
14.0000 mL | Freq: Once | INTRAVENOUS | Status: AC | PRN
  Administered 2011-10-09: 14 mL via INTRAVENOUS

## 2011-10-11 ENCOUNTER — Other Ambulatory Visit: Payer: BC Managed Care – PPO

## 2011-10-24 ENCOUNTER — Encounter (INDEPENDENT_AMBULATORY_CARE_PROVIDER_SITE_OTHER): Payer: Self-pay | Admitting: *Deleted

## 2011-11-13 ENCOUNTER — Ambulatory Visit (INDEPENDENT_AMBULATORY_CARE_PROVIDER_SITE_OTHER): Payer: Medicare Other | Admitting: Internal Medicine

## 2012-04-21 ENCOUNTER — Ambulatory Visit (INDEPENDENT_AMBULATORY_CARE_PROVIDER_SITE_OTHER): Payer: Medicare Other | Admitting: Internal Medicine

## 2012-04-21 ENCOUNTER — Encounter (INDEPENDENT_AMBULATORY_CARE_PROVIDER_SITE_OTHER): Payer: Self-pay | Admitting: Internal Medicine

## 2012-04-21 VITALS — BP 130/76 | HR 78 | Temp 98.4°F | Resp 18 | Ht 68.0 in | Wt 149.0 lb

## 2012-04-21 DIAGNOSIS — R1033 Periumbilical pain: Secondary | ICD-10-CM

## 2012-04-21 DIAGNOSIS — K921 Melena: Secondary | ICD-10-CM

## 2012-04-21 MED ORDER — HYDROCORTISONE ACETATE 25 MG RE SUPP: 25.0000 mg | Freq: Every day | RECTAL | Status: DC

## 2012-04-21 MED ORDER — HYOSCYAMINE SULFATE 0.125 MG SL SUBL: 0.1250 mg | SUBLINGUAL_TABLET | Freq: Three times a day (TID) | SUBLINGUAL | Status: DC | PRN

## 2012-04-21 NOTE — Progress Notes (Signed)
Presenting complaint;  Epigastric pain and hematochezia.  Subjective:  [name] is 67 year-old Caucasian male who is here for scheduled visit accompanied by his wife. He was last seen in the office in August 2012 and underwent colonoscopy in September 2012 with removal of 2 small polyps which were adenomas. He also had external hemorrhoids. He now presents with few week history of epigastric pain. This pain is not associated with nausea vomiting fever or chills. Pain is described to be mild dull aching pain and may last for several minutes. His appetite is good and he has not lost any weight. His wife asked him to scale back  eating fruits as she thought he may be eating too much acid fruits. Changing diet  has not helped him. He saw Dr. Sherril Croon and was begun on pantoprazole. He previously had been on this medication and stopped several months ago. He also complains of intermittent hematochezia which started over a week ago. He has used Anusol suppositories  4 days in a row..  Current Medications: Current Outpatient Prescriptions  Medication Sig Dispense Refill  . aspirin 81 MG tablet Take 81 mg by mouth daily as needed.       . Atorvastatin Calcium (LIPITOR PO) Take 10 mg by mouth at bedtime.       Marland Kitchen lisinopril (PRINIVIL,ZESTRIL) 20 MG tablet Take 10 mg by mouth daily.       . pantoprazole (PROTONIX) 40 MG tablet Take 40 mg by mouth daily as needed. Acid reflex      . hydrocortisone (ANUSOL-HC) 25 MG suppository Place 25 mg rectally at bedtime.         Objective: Blood pressure 130/76, pulse 78, temperature 98.4 F (36.9 C), temperature source Oral, resp. rate 18, height 5\' 8"  (1.727 m), weight 149 lb (67.586 kg). Patient is alert and in no acute distress but extremely anxious. Conjunctiva is pink. Sclera is nonicteric Oropharyngeal mucosa is normal. No neck masses or thyromegaly noted. Cardiac exam with regular rhythm normal S1 and S2. No murmur or gallop noted. Lungs are clear to  auscultation. Abdomen is symmetrical. Bowel sounds are normal. No bruits noted. Abdomen is soft with mild midepigastric tenderness but no organomegaly or masses noted. Rectal examination Limited to external inspection and no abnormality noted.  No LE edema or clubbing noted.  Labs/studies Results:  Assessment:  #1. Epigastric pain. He has had intermittent pain over the last few years. He does not have a lot of symptoms. If he does not improve with pantoprazole we'll consider further workup. He has history of H. pylori gastritis and was treated in 2005. Last EGD in March 2010 was within normal limits. 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 #2. Hematochezia felt to be secondary to hemorrhoids. He is up-to-date on his colonoscopies.   Plan:  Continue pantoprazole at 40 mg by mouth every morning. Anusol-HC suppository 1 per rectum daily at bedtime for 2 weeks. Levsin SL one 3 times a day when necessary. Patient will call with progress report in 2 weeks. If epigastric pain persist may consider further workup.

## 2012-04-21 NOTE — Patient Instructions (Signed)
Call office with progress report in 2 weeks. 

## 2012-05-12 ENCOUNTER — Other Ambulatory Visit (INDEPENDENT_AMBULATORY_CARE_PROVIDER_SITE_OTHER): Payer: Self-pay | Admitting: Internal Medicine

## 2012-05-12 ENCOUNTER — Telehealth (INDEPENDENT_AMBULATORY_CARE_PROVIDER_SITE_OTHER): Payer: Self-pay | Admitting: *Deleted

## 2012-05-12 DIAGNOSIS — R1011 Right upper quadrant pain: Secondary | ICD-10-CM

## 2012-05-12 NOTE — Telephone Encounter (Signed)
Patient to be advised , forward to Dewayne Hatch to arrange

## 2012-05-12 NOTE — Telephone Encounter (Signed)
LM asking for Tammy to please return his call at [phone]. He is needing to do a call report. Called Aramis back and he prefers to speak with Tammy if I didn't mind. Very nice man, told him that would be fine.

## 2012-05-12 NOTE — Telephone Encounter (Signed)
[name] sch'd 05/16/12 at [phone]), npo after midnight, patient aware

## 2012-05-12 NOTE — Telephone Encounter (Signed)
Since patient is still having right upper quadrant abdominal pain he can pursue with upper abdominal ultrasound. Last ultrasound was in 2008 Please call patient and let him know the plan

## 2012-05-12 NOTE — Telephone Encounter (Signed)
Patient called with his progress report. He states that he has noticed blood after a bowel movement only when he wipes. He is having pain upper right quadrant , just under breast pain, and ask if this could be related to his Gallbladder? He had stopped using the rectal Suppository but has started back and plans to complete the course of therapy, he says that when he inserts the suppository there is a sore spot up in rectum on the left side. The Dicyclomine he is taking only has needed.  Patient has an appointment 06/25/2012

## 2012-05-16 ENCOUNTER — Ambulatory Visit (HOSPITAL_COMMUNITY)
Admission: RE | Admit: 2012-05-16 | Discharge: 2012-05-16 | Disposition: A | Discharge status: Home / Self Care | Payer: Medicare Other | Source: Ambulatory Visit | Attending: Internal Medicine | Admitting: Internal Medicine

## 2012-05-16 ENCOUNTER — Other Ambulatory Visit (INDEPENDENT_AMBULATORY_CARE_PROVIDER_SITE_OTHER): Payer: Self-pay | Admitting: Internal Medicine

## 2012-05-16 DIAGNOSIS — R1011 Right upper quadrant pain: Secondary | ICD-10-CM | POA: Insufficient documentation

## 2012-05-19 ENCOUNTER — Telehealth (INDEPENDENT_AMBULATORY_CARE_PROVIDER_SITE_OTHER): Payer: Self-pay | Admitting: *Deleted

## 2012-05-19 NOTE — Telephone Encounter (Signed)
Patient called the office this morning asking about his test results that he had done on Friday. [name] says that after lunch on Friday , ( [employer]) , about 30 minutes after he felt a  Pain in his stomach and wen to the bathroom where he had a loose stool.  He left the bathroom and had that feeling again , returned to bathroom . He had loose stool and pain in stomach. He then began to sweat and felt that he was going to pass out. Could not even pull up his pants up. He did note mucous and blood in the stool. [name] states that he felt like he was having a heart attack. He may be reached at [phone]

## 2012-05-19 NOTE — Telephone Encounter (Signed)
Dr.Rehman has talked with the patient's wife and given her the results and his recommendations.

## 2012-06-16 ENCOUNTER — Other Ambulatory Visit (HOSPITAL_COMMUNITY): Payer: Self-pay | Admitting: Urology

## 2012-06-18 ENCOUNTER — Ambulatory Visit (HOSPITAL_COMMUNITY)
Admission: RE | Admit: 2012-06-18 | Discharge: 2012-06-18 | Disposition: A | Discharge status: Home / Self Care | Payer: Medicare Other | Source: Ambulatory Visit | Attending: Urology | Admitting: Urology

## 2012-06-18 ENCOUNTER — Other Ambulatory Visit (HOSPITAL_COMMUNITY): Payer: Self-pay | Admitting: Urology

## 2012-06-18 DIAGNOSIS — Z9079 Acquired absence of other genital organ(s): Secondary | ICD-10-CM | POA: Insufficient documentation

## 2012-06-18 DIAGNOSIS — Z8547 Personal history of malignant neoplasm of testis: Secondary | ICD-10-CM | POA: Insufficient documentation

## 2012-06-18 DIAGNOSIS — Z09 Encounter for follow-up examination after completed treatment for conditions other than malignant neoplasm: Secondary | ICD-10-CM | POA: Insufficient documentation

## 2012-07-01 ENCOUNTER — Ambulatory Visit (INDEPENDENT_AMBULATORY_CARE_PROVIDER_SITE_OTHER): Payer: Medicare Other | Admitting: Internal Medicine

## 2012-07-02 ENCOUNTER — Telehealth (INDEPENDENT_AMBULATORY_CARE_PROVIDER_SITE_OTHER): Payer: Self-pay | Admitting: *Deleted

## 2012-07-02 NOTE — Telephone Encounter (Signed)
Patient wife, [name], would like for Tammy to return her call at [phone]. She is wanting to know if it is okay for Kristoph to continue taking the Protonix. He has been on the medication for a while.

## 2012-07-02 NOTE — Telephone Encounter (Signed)
I called the number provided and got voicemail . A message was left.

## 2012-07-03 ENCOUNTER — Other Ambulatory Visit (HOSPITAL_COMMUNITY): Payer: Self-pay | Admitting: Cardiology

## 2012-07-03 DIAGNOSIS — R079 Chest pain, unspecified: Secondary | ICD-10-CM

## 2012-07-03 NOTE — Telephone Encounter (Signed)
I called the number provided and rec'd voicemail. I ask that they call me back.

## 2012-07-03 NOTE — Telephone Encounter (Signed)
Spoke with [name] and she states that [name] is taking his Protonix daily but she just wanted to be sure that he was to continue taking it until his follow-up appointment. The appointment is in [facility] called and cancelled the other one. He has been having intermittent burning in his chest but has an appointment with the Cardiologist for a stress test to rule out a blockage per [name].

## 2012-07-06 ENCOUNTER — Emergency Department (HOSPITAL_COMMUNITY)
Admission: EM | Admit: 2012-07-06 | Discharge: 2012-07-06 | Disposition: A | Discharge status: Home / Self Care | Payer: Medicare Other | Attending: Emergency Medicine | Admitting: Emergency Medicine

## 2012-07-06 ENCOUNTER — Encounter (HOSPITAL_COMMUNITY): Payer: Self-pay | Admitting: Emergency Medicine

## 2012-07-06 ENCOUNTER — Emergency Department (HOSPITAL_COMMUNITY): Payer: Medicare Other

## 2012-07-06 DIAGNOSIS — Z7982 Long term (current) use of aspirin: Secondary | ICD-10-CM | POA: Insufficient documentation

## 2012-07-06 DIAGNOSIS — I10 Essential (primary) hypertension: Secondary | ICD-10-CM | POA: Insufficient documentation

## 2012-07-06 DIAGNOSIS — Z860100 Personal history of colon polyps, unspecified: Secondary | ICD-10-CM | POA: Insufficient documentation

## 2012-07-06 DIAGNOSIS — Z8547 Personal history of malignant neoplasm of testis: Secondary | ICD-10-CM | POA: Insufficient documentation

## 2012-07-06 DIAGNOSIS — Z79899 Other long term (current) drug therapy: Secondary | ICD-10-CM | POA: Insufficient documentation

## 2012-07-06 DIAGNOSIS — R002 Palpitations: Secondary | ICD-10-CM | POA: Insufficient documentation

## 2012-07-06 DIAGNOSIS — Z87891 Personal history of nicotine dependence: Secondary | ICD-10-CM | POA: Insufficient documentation

## 2012-07-06 DIAGNOSIS — D649 Anemia, unspecified: Secondary | ICD-10-CM | POA: Insufficient documentation

## 2012-07-06 DIAGNOSIS — Z8619 Personal history of other infectious and parasitic diseases: Secondary | ICD-10-CM | POA: Insufficient documentation

## 2012-07-06 DIAGNOSIS — F411 Generalized anxiety disorder: Secondary | ICD-10-CM | POA: Insufficient documentation

## 2012-07-06 DIAGNOSIS — I491 Atrial premature depolarization: Secondary | ICD-10-CM | POA: Insufficient documentation

## 2012-07-06 DIAGNOSIS — K219 Gastro-esophageal reflux disease without esophagitis: Secondary | ICD-10-CM | POA: Insufficient documentation

## 2012-07-06 HISTORY — DX: Essential (primary) hypertension: I10

## 2012-07-06 LAB — COMPREHENSIVE METABOLIC PANEL WITH GFR
ALT: 14 U/L (ref 0–53)
AST: 19 U/L (ref 0–37)
Albumin: 3.7 g/dL (ref 3.5–5.2)
Alkaline Phosphatase: 92 U/L (ref 39–117)
BUN: 7 mg/dL (ref 6–23)
CO2: 27 meq/L (ref 19–32)
Calcium: 9.7 mg/dL (ref 8.4–10.5)
Chloride: 100 meq/L (ref 96–112)
Creatinine, Ser: 0.79 mg/dL (ref 0.50–1.35)
GFR calc Af Amer: >90 mL/min
GFR calc non Af Amer: >90 mL/min
Glucose, Bld: 111 mg/dL — ABNORMAL HIGH (ref 70–99)
Potassium: 3.9 meq/L (ref 3.5–5.1)
Sodium: 135 meq/L (ref 135–145)
Total Bilirubin: 0.1 mg/dL — ABNORMAL LOW (ref 0.3–1.2)
Total Protein: 7.5 g/dL (ref 6.0–8.3)

## 2012-07-06 LAB — POCT I-STAT TROPONIN I: Troponin i, poc: 0.02 ng/mL (ref 0.00–0.08)

## 2012-07-06 LAB — CBC
HCT: 36.4 % — ABNORMAL LOW (ref 39.0–52.0)
MCHC: 35.2 g/dL (ref 30.0–36.0)
MCV: 85.4 fL (ref 78.0–100.0)
RDW: 12.7 % (ref 11.5–15.5)

## 2012-07-06 LAB — MAGNESIUM: Magnesium: 2 mg/dL (ref 1.5–2.5)

## 2012-07-06 NOTE — ED Notes (Signed)
Awaiting discharge paperwork from EDP 

## 2012-07-06 NOTE — ED Notes (Signed)
Pt c/o palpitations starting today; pt sts thinks could be from meds that he is taking for tick bite; pt denies CP or SOB

## 2012-07-06 NOTE — ED Provider Notes (Signed)
History     CSN: [id number]  Arrival date & time 07/06/12  1191   First MD Initiated Contact with Patient 07/06/12 1830      Chief Complaint  Patient presents with  . Palpitations    (Consider location/radiation/quality/duration/timing/severity/associated sxs/prior treatment) HPI  Patient reports last night he started feeling pauses in his heartbeat. He states it's happening fairly often. He relates he got a little bit better immediate after eating but then it returned. He denies feeling that his heart is racing or that he is having chest pain or SOB. He denies nausea, vomiting, fever, joint aches, or rash. He states he has an appointment in 3 days to have a stress test done at [facility] to evaluate some burning he had in his chest. He also reports he had a tick bite with no symptoms and was placed on doxycycline on 4/16 which he thinks may be causing the pausing in his heartbeat. Patient seems very anxious.    PCP Dr Sherril Croon Cardiologist Dr Allyson Sabal  Past Medical History  Diagnosis Date  . Cancer     testicular   . Anxiety   . Hyperlipidemia   . H. pylori infection   . GERD (gastroesophageal reflux disease)   . Personal history of colonic polyps 2 9 2007  . Hypertension     Past Surgical History  Procedure Laterality Date  . Back surgery  in2007, 2 ruptured dics  2007    2 ruptured discs  . Colonoscopy  12/06/2010    Procedure: COLONOSCOPY;  Surgeon: Malissa Hippo, MD;  Location: [facility];  Service: Endoscopy;  Laterality: N/A;    History reviewed. No pertinent family history. Mother patient is alive at age 73 Father patient died age 6 with COPD, asthma and prior open heart surgery  History  Substance Use Topics  . Smoking status: Former Smoker    Quit date: 10/23/1970  . Smokeless tobacco: Never Used  . Alcohol Use: No   Lives at home Lives with spouse employed   Review of Systems  All other systems reviewed and are  negative.    Allergies  Diazepam and Lansoprazole  Home Medications   Current Outpatient Rx  Name  Route  Sig  Dispense  Refill  . aspirin 81 MG tablet   Oral   Take 81 mg by mouth every other day.          Marland Kitchen atorvastatin (LIPITOR) 10 MG tablet   Oral   Take 10 mg by mouth at bedtime.         Marland Kitchen doxycycline (VIBRA-TABS) 100 MG tablet   Oral   Take 100 mg by mouth 2 (two) times daily. Take x14 days. Began regimen on 07/04/12         . lisinopril (PRINIVIL,ZESTRIL) 20 MG tablet   Oral   Take 20 mg by mouth daily.          . pantoprazole (PROTONIX) 40 MG tablet   Oral   Take 40 mg by mouth daily.            BP 187/75  Pulse 88  Temp(Src) 98.2 F (36.8 C) (Oral)  Resp 18  SpO2 97%  Vital signs normal    Physical Exam  Nursing note and vitals reviewed. Constitutional: He is oriented to person, place, and time. He appears well-developed and well-nourished.  Non-toxic appearance. He does not appear ill. No distress.  HENT:  Head: Normocephalic and atraumatic.  Right Ear:  External ear normal.  Left Ear: External ear normal.  Nose: Nose normal. No mucosal edema or rhinorrhea.  Mouth/Throat: Oropharynx is clear and moist and mucous membranes are normal. No dental abscesses or edematous.  Eyes: Conjunctivae and EOM are normal. Pupils are equal, round, and reactive to light.  Neck: Normal range of motion and full passive range of motion without pain. Neck supple.  Cardiovascular: Normal rate, regular rhythm and normal heart sounds.  Frequent extrasystoles are present. Exam reveals no gallop and no friction rub.   No murmur heard. Corresponds to PAC's on monitor and pt is able to feel them when they happen  Pulmonary/Chest: Effort normal and breath sounds normal. No respiratory distress. He has no wheezes. He has no rhonchi. He has no rales. He exhibits no tenderness and no crepitus.  Abdominal: Soft. Normal appearance and bowel sounds are normal. He exhibits no  distension. There is no tenderness. There is no rebound and no guarding.  Musculoskeletal: Normal range of motion. He exhibits no edema and no tenderness.  Moves all extremities well.   Neurological: He is alert and oriented to person, place, and time. He has normal strength. No cranial nerve deficit.  Skin: Skin is warm, dry and intact. No rash noted. No erythema. No pallor.     Small area on left posterior chest/shoulder from tick bite, no pustule, no rash  Psychiatric: He has a normal mood and affect. His speech is normal and behavior is normal. His mood appears not anxious.    ED Course  Procedures (including critical care time)  Pt noted to have PAC's on his monitor. Seems very anxious. When discussed his mild anemia, his wife states he has seen his doctor about rectal bleeding and was given suppositories, advised to see him again about that. He has an appt in 3 days to have a stress test and he is encouraged to keep that appointment.   Results for orders placed during the hospital encounter of 07/06/12  CBC      Result Value Range   WBC 7.9  4.0 - 10.5 K/uL   RBC 4.26  4.22 - 5.81 MIL/uL   Hemoglobin 12.8 (*) 13.0 - 17.0 g/dL   HCT 16.1 (*) 09.6 - 04.5 %   MCV 85.4  78.0 - 100.0 fL   MCH 30.0  26.0 - 34.0 pg   MCHC 35.2  30.0 - 36.0 g/dL   RDW 40.9  81.1 - 91.4 %   Platelets 288  150 - 400 K/uL  COMPREHENSIVE METABOLIC PANEL      Result Value Range   Sodium 135  135 - 145 mEq/L   Potassium 3.9  3.5 - 5.1 mEq/L   Chloride 100  96 - 112 mEq/L   CO2 27  19 - 32 mEq/L   Glucose, Bld 111 (*) 70 - 99 mg/dL   BUN 7  6 - 23 mg/dL   Creatinine, Ser 7.82  0.50 - 1.35 mg/dL   Calcium 9.7  8.4 - 95.6 mg/dL   Total Protein 7.5  6.0 - 8.3 g/dL   Albumin 3.7  3.5 - 5.2 g/dL   AST 19  0 - 37 U/L   ALT 14  0 - 53 U/L   Alkaline Phosphatase 92  39 - 117 U/L   Total Bilirubin 0.1 (*) 0.3 - 1.2 mg/dL   GFR calc non Af Amer >90  >90 mL/min   GFR calc Af Amer >90  >90 mL/min  MAGNESIUM  Result Value Range   Magnesium 2.0  1.5 - 2.5 mg/dL  POCT I-STAT TROPONIN I      Result Value Range   Troponin i, poc 0.02  0.00 - 0.08 ng/mL   Comment 3             Laboratory interpretation all normal except     Dg Chest 2 View  07/06/2012  *RADIOLOGY REPORT*  Clinical Data: Palpitations.  CHEST - 2 VIEW  Comparison: 07/25/2005.  Findings: The cardiac silhouette, mediastinal and hilar contours are normal and stable.  The lungs are clear.  There is a stable nodular density in the left upper lobe which is likely a benign granuloma.  The bony thorax is intact.  IMPRESSION: No acute cardiopulmonary findings.   Original Report Authenticated By: Rudie Meyer, M.D.      Date: 07/06/2012  Rate: 87  Rhythm: normal sinus rhythm and premature atrial contractions (PAC)  QRS Axis: normal  Intervals: normal  ST/T Wave abnormalities: nonspecific ST changes  Conduction Disutrbances:none  Narrative Interpretation:   Old EKG Reviewed: none available    1. Palpitations   2. PAC (premature atrial contraction)   3. Anemia    Plan discharge  Devoria Albe, MD, FACEP    MDM          Ward Givens, MD 07/06/12 2019

## 2012-07-09 ENCOUNTER — Ambulatory Visit (HOSPITAL_COMMUNITY)
Admission: RE | Admit: 2012-07-09 | Discharge: 2012-07-09 | Disposition: A | Discharge status: Home / Self Care | Payer: Medicare Other | Source: Ambulatory Visit | Attending: Cardiology | Admitting: Cardiology

## 2012-07-09 DIAGNOSIS — R079 Chest pain, unspecified: Secondary | ICD-10-CM

## 2012-07-09 MED ORDER — TECHNETIUM TC 99M SESTAMIBI GENERIC - CARDIOLITE
31.0000 | Freq: Once | INTRAVENOUS | Status: AC | PRN
  Administered 2012-07-09: 31 via INTRAVENOUS

## 2012-07-09 MED ORDER — TECHNETIUM TC 99M SESTAMIBI GENERIC - CARDIOLITE
10.1000 | Freq: Once | INTRAVENOUS | Status: AC | PRN
  Administered 2012-07-09: 10.1 via INTRAVENOUS

## 2012-07-09 NOTE — Procedures (Addendum)
[facility] [address] [place] [address] [place] [address] [phone]  Cardiology Nuclear Med Study  [name] is a 67 y.o. male     MRN : [id number]     DOB: Aug 18, 1945  Procedure Date: 07/09/2012  Nuclear Med Background Indication for Stress Test:  Evaluation for Ischemia History:  NO PRIOR CARDIAC HISTORY Cardiac Risk Factors: Family History - CAD, History of Smoking, Hypertension and Lipids  Symptoms:  Chest Pain, Fatigue and SOB   Nuclear Pre-Procedure Caffeine/Decaff Intake:  1:00am NPO After: 11 AM   IV Site: R Antecubital  IV 0.9% NS with Angio Cath:  22g  Chest Size (in):  44"  IV Started by: Emmit Pomfret, RN  Height: 5\' 3"  (1.6 m)  Cup Size: n/a  BMI:  Body mass index is 26.93 kg/(m^2). Weight:  152 lb (68.947 kg)   Tech Comments:  N/A    Nuclear Med Study 1 or 2 day study: 1 day  Stress Test Type:  Lexiscan  Order Authorizing Provider:  Nanetta Batty, MD   Resting Radionuclide: Technetium 73m Sestamibi  Resting Radionuclide Dose: 10.1 mCi   Stress Radionuclide:  Technetium 7m Sestamibi  Stress Radionuclide Dose: 31.0 mCi           Stress Protocol Rest HR: 75 Stress HR: 133  Rest BP: 197/91 Stress BP: 227/84  Exercise Time (min): 6:00 METS: 7.0   Predicted Max HR: 155 bpm % Max HR: 85.81 bpm Rate Pressure Product: 46962  Dose of Adenosine (mg):  n/a Dose of Lexiscan: n/a mg  Dose of Atropine (mg): n/a Dose of Dobutamine: n/a mcg/kg/min (at max HR)  Stress Test Technologist: Esperanza Sheets, CCT Nuclear Technologist: Gonzella Lex, CNMT   Rest Procedure:  Myocardial perfusion imaging was performed at rest 45 minutes following the intravenous administration of Technetium 62m Sestamibi. Stress Procedure:  The patient performed treadmill exercise using a Bruce  Protocol for 6:00 minutes. The patient stopped due to fatigue and denied any chest pain.  There were no significant ST-T wave changes.  Technetium  105m Sestamibi was injected at peak exercise and myocardial perfusion imaging was performed after a brief delay.  Transient Ischemic Dilatation (Normal <1.22):  0.74 Lung/Heart Ratio (Normal <0.45):  0.36 QGS EDV:  57 ml QGS ESV:  16 ml LV Ejection Fraction:73%  Signed by    Rest ECG: NSR - Normal EKG  Stress ECG: No significant change from baseline ECG  QPS Raw Data Images:  Normal; no motion artifact; normal heart/lung ratio. Stress Images:  Normal homogeneous uptake in all areas of the myocardium. Rest Images:  Normal homogeneous uptake in all areas of the myocardium. Subtraction (SDS):  No evidence of ischemia.  Impression Exercise Capacity:  Good exercise capacity. BP Response:  Normal blood pressure response. Clinical Symptoms:  No significant symptoms noted. ECG Impression:  No significant ST segment change suggestive of ischemia. Comparison with Prior Nuclear Study: No significant change from previous study  Overall Impression:  Normal stress nuclear study.  LV Wall Motion:  NL LV Function; NL Wall Motion   [name], MD  07/09/2012 3:46 PM

## 2012-07-23 ENCOUNTER — Encounter (HOSPITAL_COMMUNITY): Payer: Self-pay | Admitting: Emergency Medicine

## 2012-07-23 DIAGNOSIS — Z8619 Personal history of other infectious and parasitic diseases: Secondary | ICD-10-CM | POA: Insufficient documentation

## 2012-07-23 DIAGNOSIS — I10 Essential (primary) hypertension: Secondary | ICD-10-CM | POA: Insufficient documentation

## 2012-07-23 DIAGNOSIS — E785 Hyperlipidemia, unspecified: Secondary | ICD-10-CM | POA: Insufficient documentation

## 2012-07-23 DIAGNOSIS — Z7982 Long term (current) use of aspirin: Secondary | ICD-10-CM | POA: Insufficient documentation

## 2012-07-23 DIAGNOSIS — Z860100 Personal history of colon polyps, unspecified: Secondary | ICD-10-CM | POA: Insufficient documentation

## 2012-07-23 DIAGNOSIS — S30860A Insect bite (nonvenomous) of lower back and pelvis, initial encounter: Secondary | ICD-10-CM | POA: Insufficient documentation

## 2012-07-23 DIAGNOSIS — Z79899 Other long term (current) drug therapy: Secondary | ICD-10-CM | POA: Insufficient documentation

## 2012-07-23 DIAGNOSIS — K219 Gastro-esophageal reflux disease without esophagitis: Secondary | ICD-10-CM | POA: Insufficient documentation

## 2012-07-23 DIAGNOSIS — Y939 Activity, unspecified: Secondary | ICD-10-CM | POA: Insufficient documentation

## 2012-07-23 DIAGNOSIS — Y929 Unspecified place or not applicable: Secondary | ICD-10-CM | POA: Insufficient documentation

## 2012-07-23 DIAGNOSIS — W57XXXA Bitten or stung by nonvenomous insect and other nonvenomous arthropods, initial encounter: Secondary | ICD-10-CM | POA: Insufficient documentation

## 2012-07-23 DIAGNOSIS — Z8659 Personal history of other mental and behavioral disorders: Secondary | ICD-10-CM | POA: Insufficient documentation

## 2012-07-23 DIAGNOSIS — Z87891 Personal history of nicotine dependence: Secondary | ICD-10-CM | POA: Insufficient documentation

## 2012-07-23 NOTE — ED Notes (Signed)
Pt states he has a tick on his genitals.

## 2012-07-24 ENCOUNTER — Emergency Department (HOSPITAL_COMMUNITY)
Admission: EM | Admit: 2012-07-24 | Discharge: 2012-07-24 | Disposition: A | Discharge status: Home / Self Care | Payer: Medicare Other | Attending: Emergency Medicine | Admitting: Emergency Medicine

## 2012-07-24 DIAGNOSIS — W57XXXA Bitten or stung by nonvenomous insect and other nonvenomous arthropods, initial encounter: Secondary | ICD-10-CM

## 2012-07-24 NOTE — ED Provider Notes (Signed)
History     CSN: [id number]  Arrival date & time 07/23/12  2210   First MD Initiated Contact with Patient 07/24/12 0050      Chief Complaint  Patient presents with  . Tick Removal    (Consider location/radiation/quality/duration/timing/severity/associated sxs/prior treatment) HPI Comments: Pt is a 66y/o male who presents to the ED with complaint of tick bite  To the tip of the penis. Pt states he was taking a shower tonight and noted the tick. He is sure is has been there less than 24 hours. He attempted to get the tick off with alcohol and gentile pulling, but was unsuccessful. He present to the ED for assistance with removal of the tick.  The history is provided by the patient.    Past Medical History  Diagnosis Date  . Cancer     testicular   . Anxiety   . Hyperlipidemia   . H. pylori infection   . GERD (gastroesophageal reflux disease)   . Personal history of colonic polyps 2 9 2007  . Hypertension     Past Surgical History  Procedure Laterality Date  . Back surgery  in2007, 2 ruptured dics  2007    2 ruptured discs  . Colonoscopy  12/06/2010    Procedure: COLONOSCOPY;  Surgeon: Malissa Hippo, MD;  Location: [facility];  Service: Endoscopy;  Laterality: N/A;    History reviewed. No pertinent family history.  History  Substance Use Topics  . Smoking status: Former Smoker    Quit date: 10/23/1970  . Smokeless tobacco: Never Used  . Alcohol Use: No      Review of Systems  Constitutional: Negative for activity change.       All ROS Neg except as noted in HPI  HENT: Negative for nosebleeds and neck pain.   Eyes: Negative for photophobia and discharge.  Respiratory: Negative for cough, shortness of breath and wheezing.   Cardiovascular: Negative for chest pain and palpitations.  Gastrointestinal: Positive for abdominal pain. Negative for blood in stool.  Genitourinary: Negative for dysuria, frequency and hematuria.  Musculoskeletal: Positive for back  pain. Negative for arthralgias.  Skin: Negative.   Neurological: Negative for dizziness, seizures and speech difficulty.  Psychiatric/Behavioral: Negative for hallucinations and confusion. The patient is nervous/anxious.     Allergies  Diazepam and Lansoprazole  Home Medications   Current Outpatient Rx  Name  Route  Sig  Dispense  Refill  . aspirin 81 MG tablet   Oral   Take 81 mg by mouth every other day.          Marland Kitchen atorvastatin (LIPITOR) 10 MG tablet   Oral   Take 10 mg by mouth at bedtime.         Marland Kitchen doxycycline (VIBRA-TABS) 100 MG tablet   Oral   Take 100 mg by mouth 2 (two) times daily. Take x14 days. Began regimen on 07/04/12         . lisinopril (PRINIVIL,ZESTRIL) 20 MG tablet   Oral   Take 20 mg by mouth daily.          . pantoprazole (PROTONIX) 40 MG tablet   Oral   Take 40 mg by mouth daily.            BP 174/82  Pulse 70  Temp(Src) 97.8 F (36.6 C) (Oral)  Resp 18  Ht 5\' 8"  (1.727 m)  Wt 155 lb (70.308 kg)  BMI 23.57 kg/m2  SpO2 100%  Physical Exam  Nursing note  and vitals reviewed. Constitutional: He is oriented to person, place, and time. He appears well-developed and well-nourished.  Non-toxic appearance.  HENT:  Head: Normocephalic.  Right Ear: Tympanic membrane and external ear normal.  Left Ear: Tympanic membrane and external ear normal.  Eyes: EOM and lids are normal. Pupils are equal, round, and reactive to light.  Neck: Normal range of motion. Neck supple. Carotid bruit is not present.  Cardiovascular: Normal rate, regular rhythm, normal heart sounds, intact distal pulses and normal pulses.   Pulmonary/Chest: Breath sounds normal. No respiratory distress.  Abdominal: Soft. Bowel sounds are normal. There is no tenderness. There is no guarding.  Genitourinary:  Tick imbedded at the head of the penis.  Musculoskeletal: Normal range of motion.  Lymphadenopathy:       Head (right side): No submandibular adenopathy present.        Head (left side): No submandibular adenopathy present.    He has no cervical adenopathy.  Neurological: He is alert and oriented to person, place, and time. He has normal strength. No cranial nerve deficit or sensory deficit.  Skin: Skin is warm and dry.  Psychiatric: He has a normal mood and affect. His speech is normal.    ED Course  Procedures : REMOVAL OF FOREIGN BODY (TICK) FROM PENIS  - Pt identified by arm band. Permission for procedure given by the patient. Time out taken before procedure started.  The head of the penis was painted with betadine. Pt draped in sterile fashion. Tick removed with needle holder. The wound was inspected under magnification, and  Retained products noted. Bite area again painted with betadine. Pt tolerated the procedure without problem.   Labs Reviewed - No data to display No results found.   1. Tick bite       MDM  I have reviewed nursing notes, vital signs, and all appropriate lab and imaging results for this patient.  Pt presented to the ED with an embedded tick that he could not remove. Tick removed without problem. Pt states he has palpitation with doxycycline. Pt to be observed by his primary MD for any change or problem.      Kathie Dike, PA-C 07/24/12 0124

## 2012-07-24 NOTE — ED Provider Notes (Signed)
Medical screening examination/treatment/procedure(s) were performed by non-physician practitioner and as supervising physician I was immediately available for consultation/collaboration.  Skylar Flynt S. Cherysh Epperly, MD 07/24/12 0325 

## 2012-07-31 ENCOUNTER — Encounter: Payer: Self-pay | Admitting: *Deleted

## 2012-08-01 ENCOUNTER — Ambulatory Visit: Payer: Medicare Other | Admitting: Cardiovascular Disease

## 2012-08-01 ENCOUNTER — Encounter: Payer: Self-pay | Admitting: Cardiovascular Disease

## 2012-08-08 ENCOUNTER — Encounter: Payer: Self-pay | Admitting: Cardiovascular Disease

## 2012-08-08 ENCOUNTER — Ambulatory Visit (INDEPENDENT_AMBULATORY_CARE_PROVIDER_SITE_OTHER): Payer: Medicare Other | Admitting: Cardiovascular Disease

## 2012-08-08 VITALS — BP 152/92 | HR 82 | Ht 68.0 in | Wt 150.0 lb

## 2012-08-08 DIAGNOSIS — R079 Chest pain, unspecified: Secondary | ICD-10-CM

## 2012-08-08 DIAGNOSIS — I10 Essential (primary) hypertension: Secondary | ICD-10-CM

## 2012-08-08 DIAGNOSIS — I200 Unstable angina: Secondary | ICD-10-CM | POA: Insufficient documentation

## 2012-08-08 NOTE — Assessment & Plan Note (Signed)
Resolved with PPI. Neg myoview

## 2012-08-08 NOTE — Assessment & Plan Note (Signed)
On lisinopril with marginal control but he states that he is a very nervous pt

## 2012-08-08 NOTE — Patient Instructions (Signed)
Your physician recommends that you schedule a follow-up appointment in: 1 year at the [place] office. You will receive a reminder letter in the mail in about 10 months reminding you to call and schedule your appointment. If you don't receive this letter, please contact our office. Your physician recommends that you continue on your current medications as directed. Please refer to the Current Medication list given to you today.

## 2012-08-08 NOTE — Progress Notes (Signed)
08/08/2012 [name]   Jul 07, 1945  [id number]  Primary Physician Ignatius Specking., MD Primary Cardiologist: [name] MD Roseanne Reno   HPI:  Pt was seen in the office by Nada Boozer RNP 07/03/12 with CP. There were atypical features with a negative MPI in 2008. The pt was started on a PPI aand had a myoview performed4/23/14 which wa sneg. His symptoms have subsequently resolved. His CRF include HTN and hyperlipidemia.   Current Outpatient Prescriptions  Medication Sig Dispense Refill  . atorvastatin (LIPITOR) 10 MG tablet Take 10 mg by mouth at bedtime.      Marland Kitchen lisinopril (PRINIVIL,ZESTRIL) 20 MG tablet Take 20 mg by mouth daily.       . pantoprazole (PROTONIX) 40 MG tablet Take 40 mg by mouth daily.       Marland Kitchen aspirin 81 MG tablet Take 81 mg by mouth every other day.        No current facility-administered medications for this visit.    Allergies  Allergen Reactions  . Diazepam     REACTION: hyperactivity  . Lansoprazole     REACTION: oral swelling    History   Social History  . Marital Status: Married    Spouse Name: N/A    Number of Children: N/A  . Years of Education: N/A   Occupational History  . Not on file.   Social History Main Topics  . Smoking status: Former Smoker    Quit date: 10/23/1970  . Smokeless tobacco: Never Used  . Alcohol Use: No  . Drug Use: No  . Sexually Active: Not on file   Other Topics Concern  . Not on file   Social History Narrative  . No narrative on file     Review of Systems: General: negative for chills, fever, night sweats or weight changes.  Cardiovascular: negative for chest pain, dyspnea on exertion, edema, orthopnea, palpitations, paroxysmal nocturnal dyspnea or shortness of breath Dermatological: negative for rash Respiratory: negative for cough or wheezing Urologic: negative for hematuria Abdominal: negative for nausea, vomiting, diarrhea, bright red blood per rectum, melena, or hematemesis Neurologic:  negative for visual changes, syncope, or dizziness All other systems reviewed and are otherwise negative except as noted above.    Blood pressure 152/92, pulse 82, height 5\' 8"  (1.727 m), weight 150 lb (68.04 kg).  General appearance: alert and no distress Neck: no adenopathy, no carotid bruit, no JVD, supple, symmetrical, trachea midline and thyroid not enlarged, symmetric, no tenderness/mass/nodules Lungs: clear to auscultation bilaterally Heart: regular rate and rhythm, S1, S2 normal, no murmur, click, rub or gallop Extremities: extremities normal, atraumatic, no cyanosis or edema  EKG Not performed today  ASSESSMENT AND PLAN:   HTN (hypertension) On lisinopril with marginal control but he states that he is a very nervous pt  Chest pain Resolved with PPI. Neg myoview       [name] MD FACP,FACC,FAHA, [facility] 08/08/2012 11:38 AM

## 2012-09-08 ENCOUNTER — Ambulatory Visit (INDEPENDENT_AMBULATORY_CARE_PROVIDER_SITE_OTHER): Payer: Medicare Other | Admitting: Internal Medicine

## 2012-09-25 ENCOUNTER — Ambulatory Visit (INDEPENDENT_AMBULATORY_CARE_PROVIDER_SITE_OTHER): Payer: Medicare Other | Admitting: Internal Medicine

## 2012-10-26 ENCOUNTER — Emergency Department (HOSPITAL_COMMUNITY): Payer: Medicare Other

## 2012-10-26 ENCOUNTER — Emergency Department (HOSPITAL_COMMUNITY)
Admission: EM | Admit: 2012-10-26 | Discharge: 2012-10-27 | Disposition: A | Discharge status: Home / Self Care | Payer: Medicare Other | Attending: Emergency Medicine | Admitting: Emergency Medicine

## 2012-10-26 ENCOUNTER — Encounter (HOSPITAL_COMMUNITY): Payer: Self-pay | Admitting: Emergency Medicine

## 2012-10-26 DIAGNOSIS — R002 Palpitations: Secondary | ICD-10-CM | POA: Insufficient documentation

## 2012-10-26 DIAGNOSIS — R221 Localized swelling, mass and lump, neck: Secondary | ICD-10-CM | POA: Insufficient documentation

## 2012-10-26 DIAGNOSIS — T448X5A Adverse effect of centrally-acting and adrenergic-neuron-blocking agents, initial encounter: Secondary | ICD-10-CM | POA: Insufficient documentation

## 2012-10-26 DIAGNOSIS — F411 Generalized anxiety disorder: Secondary | ICD-10-CM | POA: Insufficient documentation

## 2012-10-26 DIAGNOSIS — R220 Localized swelling, mass and lump, head: Secondary | ICD-10-CM | POA: Insufficient documentation

## 2012-10-26 DIAGNOSIS — Z79899 Other long term (current) drug therapy: Secondary | ICD-10-CM | POA: Insufficient documentation

## 2012-10-26 DIAGNOSIS — Z87891 Personal history of nicotine dependence: Secondary | ICD-10-CM | POA: Insufficient documentation

## 2012-10-26 DIAGNOSIS — M549 Dorsalgia, unspecified: Secondary | ICD-10-CM | POA: Insufficient documentation

## 2012-10-26 DIAGNOSIS — Z7982 Long term (current) use of aspirin: Secondary | ICD-10-CM | POA: Insufficient documentation

## 2012-10-26 DIAGNOSIS — Z8619 Personal history of other infectious and parasitic diseases: Secondary | ICD-10-CM | POA: Insufficient documentation

## 2012-10-26 DIAGNOSIS — L299 Pruritus, unspecified: Secondary | ICD-10-CM | POA: Insufficient documentation

## 2012-10-26 DIAGNOSIS — Z8547 Personal history of malignant neoplasm of testis: Secondary | ICD-10-CM | POA: Insufficient documentation

## 2012-10-26 DIAGNOSIS — K219 Gastro-esophageal reflux disease without esophagitis: Secondary | ICD-10-CM | POA: Insufficient documentation

## 2012-10-26 DIAGNOSIS — I10 Essential (primary) hypertension: Secondary | ICD-10-CM | POA: Insufficient documentation

## 2012-10-26 DIAGNOSIS — Z860100 Personal history of colon polyps, unspecified: Secondary | ICD-10-CM | POA: Insufficient documentation

## 2012-10-26 DIAGNOSIS — E785 Hyperlipidemia, unspecified: Secondary | ICD-10-CM | POA: Insufficient documentation

## 2012-10-26 DIAGNOSIS — T783XXA Angioneurotic edema, initial encounter: Secondary | ICD-10-CM | POA: Insufficient documentation

## 2012-10-26 LAB — CBC WITH DIFFERENTIAL/PLATELET
Basophils Absolute: 0.1 10*3/uL (ref 0.0–0.1)
HCT: 39.5 % (ref 39.0–52.0)
Lymphocytes Relative: 22 % (ref 12–46)
Lymphs Abs: 1.9 10*3/uL (ref 0.7–4.0)
Monocytes Absolute: 1.3 10*3/uL — ABNORMAL HIGH (ref 0.1–1.0)
Neutro Abs: 5.3 10*3/uL (ref 1.7–7.7)
Platelets: 265 10*3/uL (ref 150–400)
RBC: 4.47 MIL/uL (ref 4.22–5.81)
RDW: 13.4 % (ref 11.5–15.5)
WBC: 8.7 10*3/uL (ref 4.0–10.5)

## 2012-10-26 LAB — BASIC METABOLIC PANEL
CO2: 28 mEq/L (ref 19–32)
Chloride: 95 mEq/L — ABNORMAL LOW (ref 96–112)
Creatinine, Ser: 0.9 mg/dL (ref 0.50–1.35)
GFR calc Af Amer: >90 mL/min (ref >90)
Sodium: 131 mEq/L — ABNORMAL LOW (ref 135–145)

## 2012-10-26 MED ORDER — DIPHENHYDRAMINE HCL 50 MG/ML IJ SOLN
50.0000 mg | Freq: Once | INTRAMUSCULAR | Status: AC
  Administered 2012-10-26: 50 mg via INTRAVENOUS
  Filled 2012-10-26: qty 1

## 2012-10-26 MED ORDER — FAMOTIDINE IN NACL 20-0.9 MG/50ML-% IV SOLN
20.0000 mg | Freq: Once | INTRAVENOUS | Status: AC
  Administered 2012-10-26: 20 mg via INTRAVENOUS
  Filled 2012-10-26: qty 50

## 2012-10-26 NOTE — ED Provider Notes (Signed)
CSN: [id number]     Arrival date & time 10/26/12  2205 History     First MD Initiated Contact with Patient 10/26/12 2222     Chief Complaint  Patient presents with  . Allergic Reaction   (Consider location/radiation/quality/duration/timing/severity/associated sxs/prior Treatment) Patient is a 67 y.o. male presenting with allergic reaction. The history is provided by the patient.  Allergic Reaction Presenting symptoms: itching and swelling   Presenting symptoms: no difficulty breathing, no difficulty swallowing and no wheezing   Presenting symptoms comment:  Left lower lip swelling left lateral tongue Severity:  Moderate Prior allergic episodes:  Allergies to medications Context comment:  Pt took lisinopril earlier this AM, and Levsin the afternoon. Relieved by:  Nothing Ineffective treatments:  None tried   Past Medical History  Diagnosis Date  . Cancer     testicular   . Anxiety   . Hyperlipidemia   . H. pylori infection   . GERD (gastroesophageal reflux disease)   . Personal history of colonic polyps 2 9 2007  . Hypertension   . Palpitations     event monior 04/11/05- SR with occ PAC  . Chest pain     myoview 07/09/12-normal, nl ef; echo 04/12/05-ef>55%, mild aortic sclerosis   Past Surgical History  Procedure Laterality Date  . Back surgery  in2007, 2 ruptured dics  2007    2 ruptured discs  . Colonoscopy  12/06/2010    Procedure: COLONOSCOPY;  Surgeon: Malissa Hippo, MD;  Location: [facility];  Service: Endoscopy;  Laterality: N/A;   History reviewed. No pertinent family history. History  Substance Use Topics  . Smoking status: Former Smoker    Quit date: 10/23/1970  . Smokeless tobacco: Never Used  . Alcohol Use: No    Review of Systems  Constitutional: Negative for activity change.       All ROS Neg except as noted in HPI  HENT: Negative for nosebleeds, trouble swallowing and neck pain.   Eyes: Negative for photophobia and discharge.  Respiratory:  Negative for cough, shortness of breath and wheezing.   Cardiovascular: Positive for palpitations. Negative for chest pain.  Gastrointestinal: Negative for abdominal pain and blood in stool.  Genitourinary: Negative for dysuria, frequency and hematuria.  Musculoskeletal: Positive for back pain. Negative for arthralgias.  Skin: Positive for itching.  Neurological: Negative for dizziness, seizures and speech difficulty.  Psychiatric/Behavioral: Negative for hallucinations and confusion. The patient is nervous/anxious.     Allergies  Diazepam; Hyoscyamine; and Lansoprazole  Home Medications   Current Outpatient Rx  Name  Route  Sig  Dispense  Refill  . aspirin 81 MG tablet   Oral   Take 81 mg by mouth every other day.          Marland Kitchen atorvastatin (LIPITOR) 10 MG tablet   Oral   Take 10 mg by mouth at bedtime.         . hyoscyamine (LEVSIN, ANASPAZ) 0.125 MG tablet   Oral   Take 0.125 mg by mouth every 4 (four) hours as needed for cramping.         Marland Kitchen lisinopril (PRINIVIL,ZESTRIL) 20 MG tablet   Oral   Take 20 mg by mouth every morning.          . pantoprazole (PROTONIX) 40 MG tablet   Oral   Take 40 mg by mouth daily.           BP 167/82  Pulse 75  Temp(Src) 98.5 F (36.9 C) (Oral)  Resp 18  Ht 5\' 8"  (1.727 m)  Wt 150 lb (68.04 kg)  BMI 22.81 kg/m2  SpO2 100% Physical Exam  Nursing note and vitals reviewed. Constitutional: He is oriented to person, place, and time. He appears well-developed and well-nourished.  Non-toxic appearance.  HENT:  Head: Normocephalic.  Right Ear: Tympanic membrane and external ear normal.  Left Ear: Tympanic membrane and external ear normal.  The left lower lip is swollen. The left lateral tongue is mildly swollen. Airway is patent. Pt speaks in complete sentences.   Eyes: EOM and lids are normal. Pupils are equal, round, and reactive to light.  Neck: Normal range of motion. Neck supple. Carotid bruit is not present.  Trachea is mid  line. No stridor.  Cardiovascular: Normal rate, regular rhythm, normal heart sounds, intact distal pulses and normal pulses.   Pulmonary/Chest: Breath sounds normal. No respiratory distress.  Abdominal: Soft. Bowel sounds are normal. There is no tenderness. There is no guarding.  Musculoskeletal: Normal range of motion.  Lymphadenopathy:       Head (right side): No submandibular adenopathy present.       Head (left side): No submandibular adenopathy present.    He has no cervical adenopathy.  Neurological: He is alert and oriented to person, place, and time. He has normal strength. No cranial nerve deficit or sensory deficit.  Skin: Skin is warm and dry. No rash noted.  Psychiatric: He has a normal mood and affect. His speech is normal.    ED Course   Procedures (including critical care time)  Labs Reviewed  BASIC METABOLIC PANEL  CBC WITH DIFFERENTIAL   No results found. No diagnosis found.  MDM  **I have reviewed nursing notes, vital signs, and all appropriate lab and imaging results for this patient.* Pt states he has taken lisinopril this AM, and levsin about 5:30pm. He present with swelling of the lower lip and mild swelling of the tongue. Swelling of the lip much improved and swelling of the tongue resolved after IV benadryl and pepcid. Observed pt in the Emergency Dept with no increase swelling of the tongue or tongue or face. Airway remains patent throughout the ED visit. Pt monitored with no acute changes in pulse ox. Pt advised to stop the lisinopril and Levsin. See his primary MD for replacement meds. He is tlo return if any changes or problem.  Kathie Dike, PA-C 10/27/12 0102

## 2012-10-26 NOTE — ED Notes (Signed)
Pt states he took a new medication this afternoon and shortly after his lips began swelling. Pt states swelling has gradually worsened. Pt denies swelling in throat, difficulty breathing.

## 2012-10-26 NOTE — ED Notes (Signed)
Pt using phone, locked keys in car. Pt very nice, little anxious worried about throat closing up.

## 2012-10-27 NOTE — ED Notes (Signed)
Patient given discharge instruction, verbalized understand. IV removed, band aid applied. Patient ambulatory out of the department, keys locked in car, security will assist pt to get phone can keys to call for ride.

## 2012-10-27 NOTE — ED Provider Notes (Signed)
Medical screening examination/treatment/procedure(s) were conducted as a shared visit with non-physician practitioner(s) and myself.  I personally evaluated the patient during the encounter  Pt well appearing on my eval.  I could clearly see his uvula without any edema, no stridor noted.  His tongue was not edematous on my evaluation.  I feel he is stable for d/c but must stop levsin and lisinopril.  Joya Gaskins, MD 10/27/12 [phone]

## 2012-11-13 ENCOUNTER — Encounter (HOSPITAL_COMMUNITY): Payer: Self-pay | Admitting: Pharmacy Technician

## 2012-11-15 ENCOUNTER — Emergency Department (HOSPITAL_COMMUNITY)
Admission: EM | Admit: 2012-11-15 | Discharge: 2012-11-15 | Disposition: A | Discharge status: Home / Self Care | Payer: No Typology Code available for payment source | Attending: Emergency Medicine | Admitting: Emergency Medicine

## 2012-11-15 ENCOUNTER — Encounter (HOSPITAL_COMMUNITY): Payer: Self-pay | Admitting: Emergency Medicine

## 2012-11-15 ENCOUNTER — Emergency Department (HOSPITAL_COMMUNITY): Payer: No Typology Code available for payment source

## 2012-11-15 DIAGNOSIS — S0003XA Contusion of scalp, initial encounter: Secondary | ICD-10-CM | POA: Insufficient documentation

## 2012-11-15 DIAGNOSIS — Z8679 Personal history of other diseases of the circulatory system: Secondary | ICD-10-CM | POA: Insufficient documentation

## 2012-11-15 DIAGNOSIS — Z860100 Personal history of colon polyps, unspecified: Secondary | ICD-10-CM | POA: Insufficient documentation

## 2012-11-15 DIAGNOSIS — Z87891 Personal history of nicotine dependence: Secondary | ICD-10-CM | POA: Insufficient documentation

## 2012-11-15 DIAGNOSIS — R002 Palpitations: Secondary | ICD-10-CM | POA: Insufficient documentation

## 2012-11-15 DIAGNOSIS — Y92410 Unspecified street and highway as the place of occurrence of the external cause: Secondary | ICD-10-CM | POA: Insufficient documentation

## 2012-11-15 DIAGNOSIS — F411 Generalized anxiety disorder: Secondary | ICD-10-CM | POA: Insufficient documentation

## 2012-11-15 DIAGNOSIS — Z7982 Long term (current) use of aspirin: Secondary | ICD-10-CM | POA: Insufficient documentation

## 2012-11-15 DIAGNOSIS — Y9389 Activity, other specified: Secondary | ICD-10-CM | POA: Insufficient documentation

## 2012-11-15 DIAGNOSIS — Z8547 Personal history of malignant neoplasm of testis: Secondary | ICD-10-CM | POA: Insufficient documentation

## 2012-11-15 DIAGNOSIS — Z8619 Personal history of other infectious and parasitic diseases: Secondary | ICD-10-CM | POA: Insufficient documentation

## 2012-11-15 DIAGNOSIS — K219 Gastro-esophageal reflux disease without esophagitis: Secondary | ICD-10-CM | POA: Insufficient documentation

## 2012-11-15 DIAGNOSIS — E785 Hyperlipidemia, unspecified: Secondary | ICD-10-CM | POA: Insufficient documentation

## 2012-11-15 DIAGNOSIS — S0093XA Contusion of unspecified part of head, initial encounter: Secondary | ICD-10-CM

## 2012-11-15 DIAGNOSIS — Z79899 Other long term (current) drug therapy: Secondary | ICD-10-CM | POA: Insufficient documentation

## 2012-11-15 DIAGNOSIS — I10 Essential (primary) hypertension: Secondary | ICD-10-CM | POA: Insufficient documentation
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
## 2012-11-16 ENCOUNTER — Encounter (HOSPITAL_COMMUNITY): Payer: Self-pay | Admitting: Emergency Medicine

## 2012-11-16 ENCOUNTER — Emergency Department (HOSPITAL_COMMUNITY)
Admission: EM | Admit: 2012-11-16 | Discharge: 2012-11-16 | Disposition: A | Discharge status: Home / Self Care | Payer: No Typology Code available for payment source | Attending: Emergency Medicine | Admitting: Emergency Medicine

## 2012-11-16 ENCOUNTER — Emergency Department (HOSPITAL_COMMUNITY): Payer: No Typology Code available for payment source

## 2012-11-16 DIAGNOSIS — S0990XA Unspecified injury of head, initial encounter: Secondary | ICD-10-CM | POA: Insufficient documentation

## 2012-11-16 DIAGNOSIS — I10 Essential (primary) hypertension: Secondary | ICD-10-CM | POA: Insufficient documentation

## 2012-11-16 DIAGNOSIS — Z7982 Long term (current) use of aspirin: Secondary | ICD-10-CM | POA: Insufficient documentation

## 2012-11-16 DIAGNOSIS — Z87891 Personal history of nicotine dependence: Secondary | ICD-10-CM | POA: Insufficient documentation

## 2012-11-16 DIAGNOSIS — Z888 Allergy status to other drugs, medicaments and biological substances status: Secondary | ICD-10-CM | POA: Insufficient documentation

## 2012-11-16 DIAGNOSIS — F411 Generalized anxiety disorder: Secondary | ICD-10-CM | POA: Insufficient documentation

## 2012-11-16 DIAGNOSIS — Z79899 Other long term (current) drug therapy: Secondary | ICD-10-CM | POA: Insufficient documentation

## 2012-11-16 DIAGNOSIS — Z8669 Personal history of other diseases of the nervous system and sense organs: Secondary | ICD-10-CM | POA: Insufficient documentation

## 2012-11-16 DIAGNOSIS — E785 Hyperlipidemia, unspecified: Secondary | ICD-10-CM | POA: Insufficient documentation

## 2012-11-16 DIAGNOSIS — Z860100 Personal history of colon polyps, unspecified: Secondary | ICD-10-CM | POA: Insufficient documentation

## 2012-11-16 DIAGNOSIS — K219 Gastro-esophageal reflux disease without esophagitis: Secondary | ICD-10-CM | POA: Insufficient documentation

## 2012-11-16 DIAGNOSIS — Z8547 Personal history of malignant neoplasm of testis: Secondary | ICD-10-CM | POA: Insufficient documentation

## 2012-11-16 DIAGNOSIS — Y9389 Activity, other specified: Secondary | ICD-10-CM | POA: Insufficient documentation

## 2012-11-16 DIAGNOSIS — Y92410 Unspecified street and highway as the place of occurrence of the external cause: Secondary | ICD-10-CM | POA: Insufficient documentation

## 2012-11-16 DIAGNOSIS — S0990XS Unspecified injury of head, sequela: Secondary | ICD-10-CM

## 2012-11-16 HISTORY — DX: Headache: R51

## 2012-11-16 HISTORY — DX: Headache, unspecified: R51.9

## 2012-11-16 MED ORDER — HYDROCODONE-ACETAMINOPHEN 5-325 MG PO TABS: 2.0000 | ORAL_TABLET | ORAL | Status: DC | PRN

## 2012-11-16 MED ORDER — HYDROCODONE-ACETAMINOPHEN 5-325 MG PO TABS
2.0000 | ORAL_TABLET | Freq: Once | ORAL | Status: AC
  Administered 2012-11-16: 1 via ORAL
  Filled 2012-11-16: qty 2

## 2012-11-16 MED ORDER — IBUPROFEN 800 MG PO TABS: 800.0000 mg | ORAL_TABLET | Freq: Three times a day (TID) | ORAL | Status: DC
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
## 2012-11-16 NOTE — ED Notes (Signed)
Pt stated he was feeling all right after fluid challenge and stated his head still hurt a little bit though.

## 2012-11-16 NOTE — ED Notes (Signed)
Patient transported to CT 

## 2012-11-18 ENCOUNTER — Encounter (HOSPITAL_COMMUNITY): Admission: RE | Admit: 2012-11-18 | Payer: Medicare Other | Source: Ambulatory Visit

## 2012-12-03 ENCOUNTER — Encounter (HOSPITAL_COMMUNITY)
Admission: RE | Admit: 2012-12-03 | Discharge: 2012-12-03 | Disposition: A | Discharge status: Home / Self Care | Payer: Medicare Other | Source: Ambulatory Visit | Attending: Ophthalmology | Admitting: Ophthalmology

## 2012-12-03 ENCOUNTER — Encounter (HOSPITAL_COMMUNITY): Payer: Self-pay

## 2012-12-03 ENCOUNTER — Other Ambulatory Visit: Payer: Self-pay | Admitting: Neurosurgery

## 2012-12-03 DIAGNOSIS — Z01812 Encounter for preprocedural laboratory examination: Secondary | ICD-10-CM | POA: Insufficient documentation

## 2012-12-03 DIAGNOSIS — M545 Low back pain, unspecified: Secondary | ICD-10-CM

## 2012-12-03 DIAGNOSIS — Z01818 Encounter for other preprocedural examination: Secondary | ICD-10-CM | POA: Insufficient documentation

## 2012-12-03 LAB — BASIC METABOLIC PANEL
CO2: 28 mEq/L (ref 19–32)
Calcium: 9.9 mg/dL (ref 8.4–10.5)
Chloride: 99 mEq/L (ref 96–112)
GFR calc Af Amer: >90 mL/min (ref >90)
GFR calc non Af Amer: 89 mL/min — ABNORMAL LOW (ref >90)
Glucose, Bld: 118 mg/dL — ABNORMAL HIGH (ref 70–99)
Sodium: 135 mEq/L (ref 135–145)
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
## 2012-12-05 MED ORDER — LIDOCAINE HCL (PF) 1 % IJ SOLN
INTRAMUSCULAR | Status: AC
  Filled 2012-12-05: qty 2

## 2012-12-05 MED ORDER — LIDOCAINE HCL 3.5 % OP GEL
OPHTHALMIC | Status: AC
  Filled 2012-12-05: qty 5

## 2012-12-05 MED ORDER — CYCLOPENTOLATE-PHENYLEPHRINE OP SOLN OPTIME - NO CHARGE
OPHTHALMIC | Status: AC
  Filled 2012-12-05: qty 2

## 2012-12-05 MED ORDER — NEOMYCIN-POLYMYXIN-DEXAMETH 3.5-10000-0.1 OP OINT
TOPICAL_OINTMENT | OPHTHALMIC | Status: AC
  Filled 2012-12-05: qty 3.5

## 2012-12-05 MED ORDER — TETRACAINE HCL 0.5 % OP SOLN
OPHTHALMIC | Status: AC
  Filled 2012-12-05: qty 2

## 2012-12-07 ENCOUNTER — Ambulatory Visit
Admission: RE | Admit: 2012-12-07 | Discharge: 2012-12-07 | Disposition: A | Discharge status: Home / Self Care | Payer: Medicare Other | Source: Ambulatory Visit | Attending: Neurosurgery | Admitting: Neurosurgery

## 2012-12-07 DIAGNOSIS — M545 Low back pain, unspecified: Secondary | ICD-10-CM

## 2012-12-07 MED ORDER — GADOBENATE DIMEGLUMINE 529 MG/ML IV SOLN
13.0000 mL | Freq: Once | INTRAVENOUS | Status: AC | PRN
  Administered 2012-12-07: 13 mL via INTRAVENOUS

## 2012-12-08 ENCOUNTER — Encounter (HOSPITAL_COMMUNITY): Payer: Self-pay | Admitting: *Deleted

## 2012-12-08 ENCOUNTER — Ambulatory Visit (HOSPITAL_COMMUNITY)
Admission: RE | Admit: 2012-12-08 | Discharge: 2012-12-08 | Disposition: A | Discharge status: Home / Self Care | Payer: Medicare Other | Source: Ambulatory Visit | Attending: Ophthalmology | Admitting: Ophthalmology

## 2012-12-08 ENCOUNTER — Encounter (HOSPITAL_COMMUNITY)
Admission: RE | Disposition: A | Discharge status: Home / Self Care | Payer: Self-pay | Source: Ambulatory Visit | Attending: Ophthalmology

## 2012-12-08 ENCOUNTER — Encounter (HOSPITAL_COMMUNITY): Payer: Self-pay | Admitting: Anesthesiology

## 2012-12-08 ENCOUNTER — Ambulatory Visit (HOSPITAL_COMMUNITY): Payer: Medicare Other | Admitting: Anesthesiology

## 2012-12-08 DIAGNOSIS — H2589 Other age-related cataract: Secondary | ICD-10-CM | POA: Insufficient documentation

## 2012-12-08 DIAGNOSIS — I10 Essential (primary) hypertension: Secondary | ICD-10-CM | POA: Insufficient documentation

## 2012-12-08 HISTORY — PX: CATARACT EXTRACTION W/PHACO: SHX586

## 2012-12-08 SURGERY — PHACOEMULSIFICATION, CATARACT, WITH IOL INSERTION
Anesthesia: Monitor Anesthesia Care | Site: Eye | Laterality: Left | Wound class: Clean

## 2012-12-08 MED ORDER — POVIDONE-IODINE 5 % OP SOLN
OPHTHALMIC | Status: DC | PRN
  Administered 2012-12-08: 1 via OPHTHALMIC

## 2012-12-08 MED ORDER — LIDOCAINE HCL 3.5 % OP GEL
1.0000 "application " | Freq: Once | OPHTHALMIC | Status: AC
  Administered 2012-12-08: 1 via OPHTHALMIC

## 2012-12-08 MED ORDER — MIDAZOLAM HCL 2 MG/2ML IJ SOLN
INTRAMUSCULAR | Status: AC
  Filled 2012-12-08: qty 2

## 2012-12-08 MED ORDER — LIDOCAINE HCL (PF) 1 % IJ SOLN
INTRAMUSCULAR | Status: DC | PRN
  Administered 2012-12-08: .6 mL

## 2012-12-08 MED ORDER — LIDOCAINE 3.5 % OP GEL OPTIME - NO CHARGE
OPHTHALMIC | Status: DC | PRN
  Administered 2012-12-08: 2 [drp] via OPHTHALMIC

## 2012-12-08 MED ORDER — FENTANYL CITRATE 0.05 MG/ML IJ SOLN
25.0000 ug | INTRAMUSCULAR | Status: AC
  Administered 2012-12-08 (×2): 25 ug via INTRAVENOUS

## 2012-12-08 MED ORDER — BSS IO SOLN
INTRAOCULAR | Status: DC | PRN
  Administered 2012-12-08: 15 mL via INTRAOCULAR

## 2012-12-08 MED ORDER — LACTATED RINGERS IV SOLN
INTRAVENOUS | Status: DC
  Administered 2012-12-08: 1000 mL via INTRAVENOUS

## 2012-12-08 MED ORDER — MIDAZOLAM HCL 2 MG/2ML IJ SOLN
1.0000 mg | INTRAMUSCULAR | Status: DC | PRN
  Administered 2012-12-08: 2 mg via INTRAVENOUS

## 2012-12-08 MED ORDER — PROVISC 10 MG/ML IO SOLN
INTRAOCULAR | Status: DC | PRN
  Administered 2012-12-08: 8.5 mg via INTRAOCULAR

## 2012-12-08 MED ORDER — FENTANYL CITRATE 0.05 MG/ML IJ SOLN
INTRAMUSCULAR | Status: AC
  Filled 2012-12-08: qty 2

## 2012-12-08 MED ORDER — EPINEPHRINE HCL 1 MG/ML IJ SOLN
INTRAMUSCULAR | Status: AC
  Filled 2012-12-08: qty 1

## 2012-12-08 MED ORDER — NEOMYCIN-POLYMYXIN-DEXAMETH 0.1 % OP OINT
TOPICAL_OINTMENT | OPHTHALMIC | Status: DC | PRN
  Administered 2012-12-08: 1 via OPHTHALMIC

## 2012-12-08 MED ORDER — PHENYLEPHRINE HCL 2.5 % OP SOLN
1.0000 [drp] | OPHTHALMIC | Status: AC
  Administered 2012-12-08 (×3): 1 [drp] via OPHTHALMIC

## 2012-12-08 MED ORDER — FENTANYL CITRATE 0.05 MG/ML IJ SOLN: 25.0000 ug | INTRAMUSCULAR | Status: DC | PRN

## 2012-12-08 MED ORDER — EPINEPHRINE HCL 1 MG/ML IJ SOLN
INTRAOCULAR | Status: DC | PRN
  Administered 2012-12-08: 11:00:00

## 2012-12-08 MED ORDER — ONDANSETRON HCL 4 MG/2ML IJ SOLN: 4.0000 mg | Freq: Once | INTRAMUSCULAR | Status: DC | PRN

## 2012-12-08 MED ORDER — CYCLOPENTOLATE-PHENYLEPHRINE 0.2-1 % OP SOLN
1.0000 [drp] | OPHTHALMIC | Status: AC
  Administered 2012-12-08 (×3): 1 [drp] via OPHTHALMIC

## 2012-12-08 MED ORDER — TETRACAINE HCL 0.5 % OP SOLN
1.0000 [drp] | OPHTHALMIC | Status: AC
  Administered 2012-12-08 (×3): 1 [drp] via OPHTHALMIC

## 2012-12-08 SURGICAL SUPPLY — 32 items
CAPSULAR TENSION RING-AMO (OPHTHALMIC RELATED) IMPLANT
CLOTH BEACON ORANGE TIMEOUT ST (SAFETY) ×2 IMPLANT
EYE SHIELD UNIVERSAL CLEAR (GAUZE/BANDAGES/DRESSINGS) ×2 IMPLANT
GLOVE BIO SURGEON STRL SZ 6.5 (GLOVE) IMPLANT
GLOVE BIOGEL PI IND STRL 6.5 (GLOVE) ×1 IMPLANT
GLOVE BIOGEL PI IND STRL 7.0 (GLOVE) IMPLANT
GLOVE BIOGEL PI IND STRL 7.5 (GLOVE) IMPLANT
GLOVE BIOGEL PI INDICATOR 6.5 (GLOVE) ×1
GLOVE BIOGEL PI INDICATOR 7.0 (GLOVE)
GLOVE BIOGEL PI INDICATOR 7.5 (GLOVE)
GLOVE ECLIPSE 6.5 STRL STRAW (GLOVE) IMPLANT
GLOVE ECLIPSE 7.0 STRL STRAW (GLOVE) IMPLANT
GLOVE ECLIPSE 7.5 STRL STRAW (GLOVE) IMPLANT
GLOVE EXAM NITRILE LRG STRL (GLOVE) ×2 IMPLANT
GLOVE EXAM NITRILE MD LF STRL (GLOVE) IMPLANT
GLOVE SKINSENSE NS SZ6.5 (GLOVE)
GLOVE SKINSENSE NS SZ7.0 (GLOVE)
GLOVE SKINSENSE STRL SZ6.5 (GLOVE) IMPLANT
GLOVE SKINSENSE STRL SZ7.0 (GLOVE) IMPLANT
KIT VITRECTOMY (OPHTHALMIC RELATED) IMPLANT
PAD ARMBOARD 7.5X6 YLW CONV (MISCELLANEOUS) ×2 IMPLANT
PROC W NO LENS (INTRAOCULAR LENS)
PROC W SPEC LENS (INTRAOCULAR LENS)
PROCESS W NO LENS (INTRAOCULAR LENS) IMPLANT
PROCESS W SPEC LENS (INTRAOCULAR LENS) IMPLANT
RING MALYGIN (MISCELLANEOUS) IMPLANT
SIGHTPATH CAT PROC W REG LENS (Ophthalmic Related) ×2 IMPLANT
SYR TB 1ML LL NO SAFETY (SYRINGE) ×2 IMPLANT
TAPE SURG TRANSPORE 1 IN (GAUZE/BANDAGES/DRESSINGS) ×1 IMPLANT
TAPE SURGICAL TRANSPORE 1 IN (GAUZE/BANDAGES/DRESSINGS) ×1
VISCOELASTIC ADDITIONAL (OPHTHALMIC RELATED) IMPLANT
WATER STERILE IRR 250ML POUR (IV SOLUTION) ×2 IMPLANT

## 2012-12-08 NOTE — OR Nursing (Signed)
Gave of Fentanyl to Dillard Cannon, CRNA, to be used in Operating Room.

## 2012-12-08 NOTE — Op Note (Signed)
Date of Admission: 12/08/2012  Date of Surgery: 12/08/2012  Pre-Op Dx: Cataract  Left  Eye  Post-Op Dx: Combined Cataract  Left  Eye,  Dx Code 366.19  Surgeon: Gemma Payor, M.D.  Assistants: None  Anesthesia: Topical with MAC  Indications: Painless, progressive loss of vision with compromise of daily activities.  Surgery: Cataract Extraction with Intraocular lens Implant Left Eye  Discription: The patient had dilating drops and viscous lidocaine placed into the left eye in the pre-op holding area. After transfer to the operating room, a time out was performed. The patient was then prepped and draped. Beginning with a 75 degree blade a paracentesis port was made at the surgeon's 2 o'clock position. The anterior chamber was then filled with 1% non-preserved lidocaine. This was followed by filling the anterior chamber with Provisc. A 2.62mm keratome blade was used to make a clear corneal incision at the temporal limbus. A bent cystatome needle was used to create a continuous tear capsulotomy. Hydrodissection was performed with balanced salt solution on a Fine canula. The lens nucleus was then removed using the phacoemulsification handpiece. Residual cortex was removed with the I&A handpiece. The anterior chamber and capsular bag were refilled with Provisc. A posterior chamber intraocular lens was placed into the capsular bag with it's injector. The implant was positioned with the Kuglan hook. The Provisc was then removed from the anterior chamber and capsular bag with the I&A handpiece. Stromal hydration of the main incision and paracentesis port was performed with BSS on a Fine canula. The wounds were tested for leak which was negative. The patient tolerated the procedure well. There were no operative complications. The patient was then transferred to the recovery room in stable condition.  Complications: None  Specimen: None  EBL: None  Prosthetic device: B&L enVista, MX60, power 29.5D, SN  [id number].

## 2012-12-08 NOTE — Transfer of Care (Signed)
Immediate Anesthesia Transfer of Care Note  Patient: [name]  Procedure(s) Performed: Procedure(s) with comments: CATARACT EXTRACTION PHACO AND INTRAOCULAR LENS PLACEMENT (IOC) (Left) - CDE:  23.19  Patient Location: Short Stay  Anesthesia Type:MAC  Level of Consciousness: awake, alert , oriented and patient cooperative  Airway & Oxygen Therapy: Patient Spontanous Breathing  Post-op Assessment: Report given to PACU RN, Post -op Vital signs reviewed and stable and Patient moving all extremities  Post vital signs: Reviewed and stable  Complications: No apparent anesthesia complications

## 2012-12-08 NOTE — H&P (Signed)
I have reviewed the H&P, the patient was re-examined, and I have identified no interval changes in medical condition and plan of care since the history and physical of record  

## 2012-12-08 NOTE — Anesthesia Postprocedure Evaluation (Signed)
  Anesthesia Post-op Note  Patient: [name]  Procedure(s) Performed: Procedure(s) with comments: CATARACT EXTRACTION PHACO AND INTRAOCULAR LENS PLACEMENT (IOC) (Left) - CDE:  23.19  Patient Location: Short Stay  Anesthesia Type:MAC  Level of Consciousness: awake, alert , oriented and patient cooperative  Airway and Oxygen Therapy: Patient Spontanous Breathing  Post-op Pain: none  Post-op Assessment: Post-op Vital signs reviewed, Patient's Cardiovascular Status Stable, Respiratory Function Stable, Patent Airway and Pain level controlled  Post-op Vital Signs: Reviewed and stable  Complications: No apparent anesthesia complications

## 2012-12-08 NOTE — Anesthesia Preprocedure Evaluation (Signed)
Anesthesia Evaluation  Patient identified by MRN, date of birth, ID band Patient awake    Reviewed: Allergy & Precautions, H&P , NPO status , Patient's Chart, lab work & pertinent test results  Airway Mallampati: I TM Distance: >3 FB     Dental  (+) Edentulous Upper and Edentulous Lower   Pulmonary former smoker,  breath sounds clear to auscultation        Cardiovascular hypertension, Pt. on medications + dysrhythmias (palpitations) Rhythm:Regular Rate:Normal     Neuro/Psych  Headaches, PSYCHIATRIC DISORDERS Anxiety    GI/Hepatic GERD-  Medicated and Controlled,  Endo/Other    Renal/GU      Musculoskeletal   Abdominal   Peds  Hematology   Anesthesia Other Findings   Reproductive/Obstetrics                           Anesthesia Physical Anesthesia Plan  ASA: III  Anesthesia Plan: MAC   Post-op Pain Management:    Induction: Intravenous  Airway Management Planned: Nasal Cannula  Additional Equipment:   Intra-op Plan:   Post-operative Plan:   Informed Consent: I have reviewed the patients History and Physical, chart, labs and discussed the procedure including the risks, benefits and alternatives for the proposed anesthesia with the patient or authorized representative who has indicated his/her understanding and acceptance.     Plan Discussed with:   Anesthesia Plan Comments:         Anesthesia Quick Evaluation

## 2012-12-09 ENCOUNTER — Encounter (HOSPITAL_COMMUNITY): Payer: Self-pay | Admitting: Ophthalmology

## 2013-01-01 ENCOUNTER — Ambulatory Visit (HOSPITAL_COMMUNITY)
Admission: RE | Admit: 2013-01-01 | Discharge: 2013-01-01 | Disposition: A | Discharge status: Home / Self Care | Payer: Medicare Other | Source: Ambulatory Visit | Attending: Neurosurgery | Admitting: Neurosurgery

## 2013-01-01 DIAGNOSIS — IMO0001 Reserved for inherently not codable concepts without codable children: Secondary | ICD-10-CM | POA: Insufficient documentation

## 2013-01-01 DIAGNOSIS — I10 Essential (primary) hypertension: Secondary | ICD-10-CM | POA: Insufficient documentation

## 2013-01-01 DIAGNOSIS — M545 Low back pain, unspecified: Secondary | ICD-10-CM | POA: Insufficient documentation
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
## 2013-01-05 ENCOUNTER — Ambulatory Visit (HOSPITAL_COMMUNITY): Payer: Medicare Other | Admitting: Physical Therapy

## 2013-01-07 ENCOUNTER — Ambulatory Visit (HOSPITAL_COMMUNITY)
Admission: RE | Admit: 2013-01-07 | Discharge: 2013-01-07 | Disposition: A | Discharge status: Home / Self Care | Payer: Medicare Other | Source: Ambulatory Visit | Attending: Neurosurgery | Admitting: Neurosurgery

## 2013-01-07 DIAGNOSIS — M545 Low back pain, unspecified: Secondary | ICD-10-CM
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
## 2013-01-09 ENCOUNTER — Ambulatory Visit (HOSPITAL_COMMUNITY)
Admission: RE | Admit: 2013-01-09 | Discharge: 2013-01-09 | Disposition: A | Discharge status: Home / Self Care | Payer: Medicare Other | Source: Ambulatory Visit | Attending: Neurosurgery | Admitting: Neurosurgery

## 2013-01-09 DIAGNOSIS — M545 Low back pain, unspecified: Secondary | ICD-10-CM
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
## 2013-01-13 ENCOUNTER — Ambulatory Visit (HOSPITAL_COMMUNITY)
Admission: RE | Admit: 2013-01-13 | Discharge: 2013-01-13 | Disposition: A | Discharge status: Home / Self Care | Payer: Medicare Other | Source: Ambulatory Visit | Attending: Internal Medicine | Admitting: Internal Medicine

## 2013-01-13 DIAGNOSIS — M545 Low back pain, unspecified: Secondary | ICD-10-CM
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
## 2013-01-15 ENCOUNTER — Ambulatory Visit (HOSPITAL_COMMUNITY)
Admission: RE | Admit: 2013-01-15 | Discharge: 2013-01-15 | Disposition: A | Discharge status: Home / Self Care | Payer: Medicare Other | Source: Ambulatory Visit | Attending: Internal Medicine | Admitting: Internal Medicine

## 2013-01-15 DIAGNOSIS — M545 Low back pain, unspecified: Secondary | ICD-10-CM

## 2013-01-15 NOTE — Progress Notes (Signed)
Physical Therapy Treatment Patient Details  Name: [name] MRN: [id number] Date of Birth: [birth year]  Today's Date: 01/15/2013 Time: 1518-1605 PT Time Calculation (min): 47 min Charges: TE: 4098-1191 Estim/Heat 1 Visit#: 5 of 8  Re-eval: 01/31/13    Authorization: Medicare  Authorization Time Period:    Authorization Visit#: 5 of 10   Subjective: Symptoms/Limitations Symptoms: "I have really been working hard on doing what you have told me to do."  Pt reports that the day after therapy he felt good and today he is sore.  Pain Assessment Pain Score: 5  Pain Location: Back Pain Orientation: Right;Left  Precautions/Restrictions     Exercise/Treatments Machines for Strengthening Cybex Lumbar Extension: 2 PL x10 reps Cybex Knee Extension: 1 PL x10 reps Cybex Knee Flexion: 2 PL x10 reps Standing Shoulder Extension: Both;15 reps;Theraband Theraband Level (Shoulder Extension): Level 2 (Red) Shoulder ADduction: Both;15 reps;Theraband Theraband Level (Shoulder Adduction): Level 2 (Red)  Modalities Modalities: Archivist Stimulation Location: lumbosacral spine Electrical Stimulation Action: interferential electrical stimulation (IFES) Electrical Stimulation Parameters: 9.4 volts x15 minutes 80/150 Electrical Stimulation Goals: Pain  Physical Therapy Assessment and Plan PT Assessment and Plan Clinical Impression Statement: Pt continues to make slow and steady progress.  Added cybex lumbar and LE strengthening to improve functional strength. Added estim with hot pack at end of session without decreased pain. Rehab Potential: Good PT Plan: continue with prone manual therapy and add standing core exercsies.     Goals Home Exercise Program Pt/caregiver will Perform Home Exercise Program: Independently PT Goal: Perform Home Exercise Program - Progress: Progressing toward goal PT Short Term Goals Time to Complete Short Term  Goals: 3 weeks PT Short Term Goal 1: Pt will tolerate prone postioning with pain less than 3/10 in order to complete core stabilization exercises.  PT Short Term Goal 1 - Progress: Partly met PT Short Term Goal 2: Pt will decrease his resting tone by way of guided diaphragmatic breathing and palpation  in order to independently decrease pain to his Lt leg and lumbar spine.  PT Short Term Goal 2 - Progress: Met PT Short Term Goal 3: Pt will begin to improve his BLE flexibility in order to progress towards normalized lumbar AROM and posture.  PT Short Term Goal 3 - Progress: Progressing toward goal PT Short Term Goal 4: Pt will be educated on proper posture and self correction of SI joint.  PT Short Term Goal 4 - Progress: Progressing toward goal PT Long Term Goals Time to Complete Long Term Goals:  (6 weeks) PT Long Term Goal 1: Pt will improve his FOTO to Status greater than 60% and Limitation less than 40% for improved percieved functional ability.  PT Long Term Goal 1 - Progress: Progressing toward goal PT Long Term Goal 2: Pt will improve BLE strength and core strength to [facility] in order to tolerate walking for greater than 45 minutes to continue with community ambulation.  PT Long Term Goal 2 - Progress: Progressing toward goal Long Term Goal 3: Pt will present with minimal fascial restrictions and normalized SI alignment in order to report pain less than a 3/10 for 75% of his day for improved QOL.  Long Term Goal 3 Progress: Progressing toward goal  Problem List Patient Active Problem List   Diagnosis Date Noted  . Lumbago 01/01/2013  . Chest pain 08/08/2012  . HTN (hypertension) 08/08/2012  . Diarrhea 10/23/2010  . Epigastric pain 10/23/2010  . Anxiety   . Hyperlipidemia   .  GERD (gastroesophageal reflux disease)   . COLONIC POLYPS 11/23/2008  . GERD 11/23/2008  . DYSPEPSIA, CHRONIC 11/23/2008  . IBS 11/23/2008  . LOSS OF APPETITE 11/23/2008  . EPIGASTRIC PAIN 11/23/2008  .  HEARTBURN, HX OF 11/23/2008    General Behavior During Therapy: Anxious  GP    Kenyia Wambolt, MPT, ATC 01/15/2013, 4:09 PM

## 2013-01-20 ENCOUNTER — Ambulatory Visit (HOSPITAL_COMMUNITY)
Admission: RE | Admit: 2013-01-20 | Discharge: 2013-01-20 | Disposition: A | Discharge status: Home / Self Care | Payer: Medicare Other | Source: Ambulatory Visit | Attending: Neurosurgery | Admitting: Neurosurgery

## 2013-01-20 DIAGNOSIS — IMO0001 Reserved for inherently not codable concepts without codable children: Secondary | ICD-10-CM | POA: Insufficient documentation

## 2013-01-20 DIAGNOSIS — M545 Low back pain, unspecified: Secondary | ICD-10-CM | POA: Insufficient documentation

## 2013-01-20 DIAGNOSIS — I10 Essential (primary) hypertension: Secondary | ICD-10-CM | POA: Insufficient documentation

## 2013-01-20 NOTE — Progress Notes (Signed)
Physical Therapy Treatment Patient Details  Name: [name] MRN: [id number] Date of Birth: [birth year]  Today's Date: 01/20/2013 Time: 4098-1191 PT Time Calculation (min): 53 min Charges: TE: 4782-9562 Heat/Estim: 1308-6578 Visit#: 6 of 8  Re-eval: 01/31/13    Authorization: Medicare  Authorization Time Period:    Authorization Visit#: 6 of 10   Subjective: Symptoms/Limitations Symptoms: "Lets do what we did last time it felt really good."  Pain Assessment Currently in Pain?: Yes Pain Location: Back  Precautions/Restrictions     Exercise/Treatments Aerobic Stationary Bike: NuStep: 10 Level 3 flat surfaces Machines for Strengthening Cybex Lumbar Extension: 2.5 PL x15 reps Cybex Knee Extension: 2 PL x15 reps Cybex Knee Flexion: 2 PL x10 reps Standing Shoulder Extension: Both;15 reps;Theraband Theraband Level (Shoulder Extension): Level 3 (Green) Shoulder ADduction: Both;15 reps;Theraband Theraband Level (Shoulder Adduction): Level 3 (Green)  Modalities Modalities: Copywriter, advertising Location: lumbosacral spine Electrical Stimulation Action: interferential electrical stimulation (IFES) Electrical Stimulation Parameters: 6.4 volts x15 minutes 80/150 Electrical Stimulation Goals: Pain  Physical Therapy Assessment and Plan PT Assessment and Plan Clinical Impression Statement: increased weight to improve postural and LE strength.  Pt reports moderate increase in pain after exercises which decreases after heat and estim.  Discussed with pt if needed to order a home TENS unit.  Rehab Potential: Good PT Plan: continue with prone manual therapy and add standing core exercsies.     Goals    Problem List Patient Active Problem List   Diagnosis Date Noted  . Lumbago 01/01/2013  . Chest pain 08/08/2012  . HTN (hypertension) 08/08/2012  . Diarrhea 10/23/2010  . Epigastric pain 10/23/2010  . Anxiety   .  Hyperlipidemia   . GERD (gastroesophageal reflux disease)   . COLONIC POLYPS 11/23/2008  . GERD 11/23/2008  . DYSPEPSIA, CHRONIC 11/23/2008  . IBS 11/23/2008  . LOSS OF APPETITE 11/23/2008  . EPIGASTRIC PAIN 11/23/2008  . HEARTBURN, HX OF 11/23/2008    General Behavior During Therapy: Anxious  GP    Kairen Hallinan, MPT, ATC 01/20/2013, 4:25 PM

## 2013-01-22 ENCOUNTER — Ambulatory Visit (HOSPITAL_COMMUNITY)
Admission: RE | Admit: 2013-01-22 | Discharge: 2013-01-22 | Disposition: A | Discharge status: Home / Self Care | Payer: Medicare Other | Source: Ambulatory Visit | Attending: Internal Medicine | Admitting: Internal Medicine

## 2013-01-22 DIAGNOSIS — M545 Low back pain, unspecified: Secondary | ICD-10-CM

## 2013-01-22 NOTE — Progress Notes (Signed)
Physical Therapy Treatment Patient Details  Name: [name] MRN: [id number] Date of Birth: [birth year]  Today's Date: 01/22/2013 Time: 7829-5621 PT Time Calculation (min): 70 min Charge: Therex 3086-5784, Estim/MHP [id number]  Visit#: 7 of 8  Re-eval: 01/31/13 Assessment Diagnosis: Low back pain Surgical Date: 08/17/05 Next MD Visit: Dr. Wynetta Emery - Nov 25th  Authorization: Medicare  Authorization Time Period:    Authorization Visit#: 7 of 10   Subjective: Symptoms/Limitations Symptoms: Pt reported the estim has been helping with pain, increased ease sleeping at night and rolling around bed.   Pain Assessment Currently in Pain?: Yes Pain Score: 6  Pain Location: Back  Objective:  Exercise/Treatments Aerobic Stationary Bike: NuStep: 10 hill level 3 Machines for Strengthening Cybex Lumbar Extension: 2.5 PL x15 reps Cybex Knee Extension: 2 PL x15 reps Cybex Knee Flexion: 2 PL x10 reps Leg Press: 3 Pl 15x Standing Scapular Retraction: Both;10 reps;Theraband Theraband Level (Scapular Retraction): Level 3 (Green) Row: Both;15 reps;Theraband Theraband Level (Row): Level 3 (Green) Shoulder Extension: Both;15 reps;Theraband Theraband Level (Shoulder Extension): Level 3 (Green) Shoulder ADduction: Both;15 reps;Theraband Theraband Level (Shoulder Adduction): Level 3 (Green) Prone  Single Arm Raise: 5 reps;Limitations Single Arm Raises Limitations: AAROM Straight Leg Raise: 5 reps;Limitations Straight Leg Raises Limitations: AAROM  Modalities Modalities: Electrical Stimulation;Moist Heat Moist Heat Therapy Number Minutes Moist Heat: 15 Minutes Moist Heat Location:  (back during estim) Museum/gallery exhibitions officer Stimulation Location: lumbosacral spine Electrical Stimulation Action: interferential electrical stimulation (IFES) Electrical Stimulation Parameters: 5.6 volts x15 minutes 80/150 Electrical Stimulation Goals: Pain  Physical Therapy Assessment and  Plan PT Assessment and Plan Clinical Impression Statement: Progressed to prone exercises for postural strengthening with moderate difficulty getting into position due to pain and weakness, pt required AA with SAR, able to complete SLR independentely.  Added squats for gluteal strengthening with PT facilitation required for proper form.  Ended session with estim and MHP for pain control.   PT Plan: Re-eval next session.    Goals Home Exercise Program Pt/caregiver will Perform Home Exercise Program: Independently PT Short Term Goals Time to Complete Short Term Goals: 3 weeks PT Short Term Goal 1: Pt will tolerate prone postioning with pain less than 3/10 in order to complete core stabilization exercises.  PT Short Term Goal 2: Pt will decrease his resting tone by way of guided diaphragmatic breathing and palpation  in order to independently decrease pain to his Lt leg and lumbar spine.  PT Short Term Goal 3: Pt will begin to improve his BLE flexibility in order to progress towards normalized lumbar AROM and posture.  PT Short Term Goal 4: Pt will be educated on proper posture and self correction of SI joint.  PT Short Term Goal 4 - Progress: Progressing toward goal PT Long Term Goals Time to Complete Long Term Goals:  (6 weeks) PT Long Term Goal 1: Pt will improve his FOTO to Status greater than 60% and Limitation less than 40% for improved percieved functional ability.  PT Long Term Goal 2: Pt will improve BLE strength and core strength to [facility] in order to tolerate walking for greater than 45 minutes to continue with community ambulation.  PT Long Term Goal 2 - Progress: Progressing toward goal Long Term Goal 3: Pt will present with minimal fascial restrictions and normalized SI alignment in order to report pain less than a 3/10 for 75% of his day for improved QOL.   Problem List Patient Active Problem List   Diagnosis Date Noted  . Lumbago  01/01/2013  . Chest pain 08/08/2012  . HTN  (hypertension) 08/08/2012  . Diarrhea 10/23/2010  . Epigastric pain 10/23/2010  . Anxiety   . Hyperlipidemia   . GERD (gastroesophageal reflux disease)   . COLONIC POLYPS 11/23/2008  . GERD 11/23/2008  . DYSPEPSIA, CHRONIC 11/23/2008  . IBS 11/23/2008  . LOSS OF APPETITE 11/23/2008  . EPIGASTRIC PAIN 11/23/2008  . HEARTBURN, HX OF 11/23/2008    PT - End of Session Activity Tolerance: Patient tolerated treatment well General Behavior During Therapy: Anxious  GP    Juel Burrow 01/22/2013, 4:27 PM

## 2013-01-26 ENCOUNTER — Ambulatory Visit (HOSPITAL_COMMUNITY)
Admission: RE | Admit: 2013-01-26 | Discharge: 2013-01-26 | Disposition: A | Discharge status: Home / Self Care | Payer: Medicare Other | Source: Ambulatory Visit | Attending: Internal Medicine | Admitting: Internal Medicine

## 2013-01-26 DIAGNOSIS — M545 Low back pain, unspecified: Secondary | ICD-10-CM

## 2013-01-26 NOTE — Evaluation (Signed)
Physical Therapy Re-Evaluation  Patient Details  Name: [name] MRN: [id number] Date of Birth: 07-19-1945  Today's Date: 01/26/2013 Time: 7829-5621 PT Time Calculation (min): 45 min Charges: 1 MMT/ROM TE: 25' Self Care: 3086-5784             Visit#: 8 of 12  Re-eval: 02/09/13 Assessment Diagnosis: Low back pain Surgical Date: 08/17/05 Next MD Visit: Dr. Wynetta Emery - Nov 25th  Authorization: Medicare    Authorization Time Period:    Authorization Visit#: 8 of 18   Subjective Symptoms/Limitations Symptoms: Reports good days and bad days.  Is using biofrezze to muscles when able.  He is doing his inital HEP, would like to know more exercises he can do.  Pain on average is a 5/10 to low back with occasional numbness to leg and foot.   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 abdominus (TrA) and multifidus) Functional Tests Functional Tests: FOTO: ( was Status: 49%; Limitaiton: 51%)  Assessment RLE Strength Right Hip Flexion: 4/5 (was 4/5) Right Hip Extension: 3+/5 (was 3/5) Right Hip ABduction: 5/5 (was 4/5) Right Hip ADduction: 4/5 (was 4/5) LLE Strength Left Hip Flexion: 4/5 (was 3+/5) Left Hip Extension: 3+/5 (was 3/5) Left Hip ABduction: 3+/5 (was 3+/5) Left Hip ADduction: 3+/5 (was 3+/5) Lumbar AROM Lumbar Flexion: decreased 30% (was decreased 30%) Lumbar Extension: decreased 25% - pain to low back (was decreased 25% - pain to low back) Lumbar - Right Side Bend: WNL (was decreased 10% - pain) Lumbar - Left Side Bend: WNL (was decreased 10%- pain) Lumbar - Right Rotation: WNL (was decreased 50% - pain) Lumbar - Left Rotation:  WNL (was decreased 50% - pain) Palpation Palpation: pain and tenderness to Lt gluteus medius (was pain and tenderness to Lt SI joint, quadratus lumborum, and glueteal region.)  Mobility/Balance  Posture/Postural Control Posture/Postural Control: Postural limitations Postural Limitations: Ridgid lumbar spine    Exercise/Treatments Aerobic Stationary Bike: NuStep: 10 hill level 3 Supine Bridge: 10 reps;Limitations Bridge Limitations: w/pelvic rotation Straight Leg Raise: 15 reps;Limitations Straight Leg Raises Limitations: BLE Sidelying Hip Abduction: 15 reps;Limitations Hip Abduction Limitations: BLE Other Sidelying Lumbar Exercises: Hip adduction x15 reps BLE Prone  Single Arm Raise: 5 reps;Limitations Single Arm Raises Limitations: BUE Straight Leg Raise: 10 reps Straight Leg Raises Limitations: BLE  Physical Therapy Assessment and Plan PT Assessment and Plan Clinical Impression Statement: [name] has attended 8 OP PT visits over the past 4 weeks with the following findings: continues to be limited by decreased LE strength, has improved core coordinated movemetns and has improved quality of movement to LE without as much increased fear of injuring himself.  has a significant reduction in muscle spasms and fascial restrictions, continues to struggle with increased muscle tone and generalized pain.  Recommend 2 weeks as pt is now able to tolerate prone postion and can educated in a LE and core strengthening exercises to improve psoture and decrease pain.  PT Frequency: Min 5X/week PT Duration:  (2 weeks) PT Treatment/Interventions: Therapeutic activities;Functional mobility training;Therapeutic exercise;Stair training;Modalities;Manual techniques;Neuromuscular re-education;Patient/family education PT Plan: Continue with core strengthening and stabilization exercises.     Goals Home Exercise Program Pt/caregiver will Perform Home Exercise Program: Independently PT Goal: Perform  Home Exercise Program - Progress: Met PT Short Term Goals Time  to Complete Short Term Goals: 3 weeks PT Short Term Goal 1: Pt will tolerate prone postioning with pain less than 3/10 in order to complete core stabilization exercises.  PT Short Term Goal 1 - Progress: Partly met PT Short Term Goal 2: Pt will decrease his resting tone by way of guided diaphragmatic breathing and palpation  in order to independently decrease pain to his Lt leg and lumbar spine.  PT Short Term Goal 2 - Progress: Met PT Short Term Goal 3: Pt will begin to improve his BLE flexibility in order to progress towards normalized lumbar AROM and posture.  PT Short Term Goal 3 - Progress: Progressing toward goal PT Short Term Goal 4: Pt will be educated on proper posture and self correction of SI joint.  PT Short Term Goal 4 - Progress: Met PT Long Term Goals Time to Complete Long Term Goals:  (6 weeks) PT Long Term Goal 1: Pt will improve his FOTO to Status greater than 60% and Limitation less than 40% for improved percieved functional ability.  PT Long Term Goal 1 - Progress: Progressing toward goal PT Long Term Goal 2: Pt will improve BLE strength and core strength to [facility] in order to tolerate walking for greater than 45 minutes to continue with community ambulation.  PT Long Term Goal 2 - Progress: Progressing toward goal Long Term Goal 3: Pt will present with minimal fascial restrictions and normalized SI alignment in order to report pain less than a 3/10 for 75% of his day for improved QOL.  Long Term Goal 3 Progress: Met  Problem List Patient Active Problem List   Diagnosis Date Noted  . Lumbago 01/01/2013  . Chest pain 08/08/2012  . HTN (hypertension) 08/08/2012  . Diarrhea 10/23/2010  . Epigastric pain 10/23/2010  . Anxiety   . Hyperlipidemia   . GERD (gastroesophageal reflux disease)   . COLONIC POLYPS 11/23/2008  . GERD 11/23/2008  . DYSPEPSIA, CHRONIC 11/23/2008  . IBS 11/23/2008  . LOSS OF APPETITE  11/23/2008  . EPIGASTRIC PAIN 11/23/2008  . HEARTBURN, HX OF 11/23/2008    PT - End of Session Activity Tolerance: Patient tolerated treatment well General Behavior During Therapy: Anxious PT Plan of Care PT Home Exercise Plan: given PT Patient Instructions: FOTO, goals, importance of continued movement  GP Functional Assessment Tool Used: FOTO: Status: 49%; Limitaiton: 51% Other PT Primary Current Status (L2440): At least 40 percent but less than 60 percent impaired, limited or restricted Other PT Primary Goal Status (N0272): At least 40 percent but less than 60 percent impaired, limited or restricted  Aarthi Uyeno, MPT, ATC 01/26/2013, 6:17 PM  Physician Documentation Your signature is required to indicate approval of the treatment plan as stated above.  Please sign and either send electronically or make a copy of this report for your files and return this physician signed original.   Please mark one 1.__approve of plan  2. ___approve of plan with the following conditions.   ______________________________                                                          _____________________ Physician Signature  Date  

## 2013-02-02 ENCOUNTER — Ambulatory Visit (HOSPITAL_COMMUNITY)
Admission: RE | Admit: 2013-02-02 | Discharge: 2013-02-02 | Disposition: A | Discharge status: Home / Self Care | Payer: Medicare Other | Source: Ambulatory Visit | Attending: Internal Medicine | Admitting: Internal Medicine

## 2013-02-02 DIAGNOSIS — M545 Low back pain, unspecified: Secondary | ICD-10-CM

## 2013-02-02 NOTE — Progress Notes (Signed)
Physical Therapy Treatment Patient Details  Name: [name] MRN: [id number] Date of Birth: 12-26-1945  Today's Date: 02/02/2013 Time: 1518-1600 PT Time Calculation (min): 42 min Charges: TE: 4098-1191 Manual: 1550-1600 Visit#: 9 of 12  Re-eval: 02/09/13    Authorization: Medicare  Authorization Time Period:    Authorization Visit#: 9 of 18   Subjective: Symptoms/Limitations Symptoms: Pt reports that he has been doing better. Is not focused on the pain.  Pain Assessment Currently in Pain?: Yes Pain Score: 5  Pain Location: Back  Precautions/Restrictions     Exercise/Treatments Aerobic Stationary Bike: NuStep: 10 hill level 3 Machines for Strengthening Cybex Lumbar Extension: 2.5 PL x15 reps Cybex Knee Extension: 2 PL x15 reps Cybex Knee Flexion: 2 PL x10 reps Leg Press: 3 Pl 15x Standing Functional Squats: 20 reps Scapular Retraction: Both;10 reps;Theraband Theraband Level (Scapular Retraction): Level 3 (Green) Row: Both;15 reps;Theraband Theraband Level (Row): Level 3 (Green) Shoulder Extension: Both;15 reps;Theraband Theraband Level (Shoulder Extension): Level 3 (Green) Shoulder ADduction: Both;15 reps;Theraband Theraband Level (Shoulder Adduction): Level 3 (Green) Other Standing Lumbar Exercises: Rocker board 2 minutes mod A Prone  Single Arm Raise: 5 reps;Limitations Single Arm Raises Limitations: AAROM Straight Leg Raise: 5 reps;Limitations Straight Leg Raises Limitations: AAROM  Manual Therapy Myofascial Release: Prone: to Lt gluteal region and and Rt lumbar paraspinals with instruction for diaphragmatic breathing and contract/relax to gluteal region.  Moist Heat Therapy Moist Heat Location:  (back during estim) Museum/gallery exhibitions officer Stimulation Location: lumbosacral spine Electrical Stimulation Goals: Pain  Physical Therapy Assessment and Plan PT Assessment and Plan Clinical Impression Statement: Continued with lumbar and LE  strengthening to decrease pain. Overall pt has improved pelvic mobility with walking and improved body awareness.  PT Frequency: Min 5X/week PT Duration:  (2 weeks) PT Treatment/Interventions: Therapeutic activities;Functional mobility training;Therapeutic exercise;Stair training;Modalities;Manual techniques;Neuromuscular re-education;Patient/family education PT Plan: Continue with core strengthening and stabilization exercises.     Goals Home Exercise Program Pt/caregiver will Perform Home Exercise Program: Independently PT Goal: Perform Home Exercise Program - Progress: Met PT Short Term Goals Time to Complete Short Term Goals: 3 weeks PT Short Term Goal 1: Pt will tolerate prone postioning with pain less than 3/10 in order to complete core stabilization exercises.  PT Short Term Goal 1 - Progress: Partly met PT Short Term Goal 2: Pt will decrease his resting tone by way of guided diaphragmatic breathing and palpation  in order to independently decrease pain to his Lt leg and lumbar spine.  PT Short Term Goal 2 - Progress: Met PT Short Term Goal 3: Pt will begin to improve his BLE flexibility in order to progress towards normalized lumbar AROM and posture.  PT Short Term Goal 3 - Progress: Met PT Short Term Goal 4: Pt will be educated on proper posture and self correction of SI joint.  PT Short Term Goal 4 - Progress: Met PT Long Term Goals Time to Complete Long Term Goals:  (6 weeks) PT Long Term Goal 1: Pt will improve his FOTO to Status greater than 60% and Limitation less than 40% for improved percieved functional ability.  PT Long Term Goal 1 - Progress: Progressing toward goal PT Long Term Goal 2: Pt will improve BLE strength and core strength to [facility] in order to tolerate walking for greater than 45 minutes to continue with community ambulation.  PT Long Term Goal 2 - Progress: Progressing toward goal Long Term Goal 3: Pt will present with minimal fascial restrictions and normalized  SI alignment  in order to report pain less than a 3/10 for 75% of his day for improved QOL.  Long Term Goal 3 Progress: Met  Problem List Patient Active Problem List   Diagnosis Date Noted  . Lumbago 01/01/2013  . Chest pain 08/08/2012  . HTN (hypertension) 08/08/2012  . Diarrhea 10/23/2010  . Epigastric pain 10/23/2010  . Anxiety   . Hyperlipidemia   . GERD (gastroesophageal reflux disease)   . COLONIC POLYPS 11/23/2008  . GERD 11/23/2008  . DYSPEPSIA, CHRONIC 11/23/2008  . IBS 11/23/2008  . LOSS OF APPETITE 11/23/2008  . EPIGASTRIC PAIN 11/23/2008  . HEARTBURN, HX OF 11/23/2008    PT - End of Session Activity Tolerance: Patient tolerated treatment well General Behavior During Therapy: Anxious PT Plan of Care PT Home Exercise Plan: given PT Patient Instructions: FOTO, goals, importance of continued movement  GP Functional Assessment Tool Used: FOTO: Status: 49%; Limitaiton: 51% Other PT Primary Current Status (Z6109): At least 40 percent but less than 60 percent impaired, limited or restricted Other PT Primary Goal Status (U0454): At least 40 percent but less than 60 percent impaired, limited or restricted  Antonio Creswell, MPT, ATC 02/02/2013, 4:12 PM

## 2013-02-04 ENCOUNTER — Ambulatory Visit (HOSPITAL_COMMUNITY)
Admission: RE | Admit: 2013-02-04 | Discharge: 2013-02-04 | Disposition: A | Discharge status: Home / Self Care | Payer: Medicare Other | Source: Ambulatory Visit | Attending: Internal Medicine | Admitting: Internal Medicine

## 2013-02-04 DIAGNOSIS — M545 Low back pain, unspecified: Secondary | ICD-10-CM

## 2013-02-04 NOTE — Evaluation (Addendum)
Physical Therapy Re-evaluation  Patient Details  Name: [name] MRN: [id number] Date of Birth: 1945/03/31  Today's Date: 02/04/2013 Time: 4098-1191 PT Time Calculation (min): 46 min Charges TE: 4782-9562 AROM/MMT: 1308-6578 Manual: 1540-1555 Self Care: 4696-2952             Visit#: 10 of 12  Re-eval: 02/09/13 Assessment Diagnosis: Low back pain Surgical Date: 08/17/05 Next MD Visit: Dr. Wynetta Emery - Nov 25th  Authorization: Medicare    Authorization Time Period:    Authorization Visit#: 10 of 18   Subjective Symptoms/Limitations Symptoms: Pt reports that he is doing ok today.  He is concerned about his wife. Will visit Dr. Wynetta Emery on 11/25.  Assessment RLE Strength Right Hip Flexion: 4/5 (was 4/5) Right Hip Extension: 4/5 (was 3+/5) Right Hip ABduction: 4/5 (was 5/5) Right Hip ADduction: 5/5 (was 4/5) Right Knee Flexion: 5/5 Right Knee Extension: 5/5 LLE Strength Left Hip Flexion: 4/5 (was 4/5) Left Hip Extension: 4/5 (was 3+/5) Left Hip ABduction: 4/5 (was 3+/5) Left Hip ADduction: 5/5 (was 3+/5) Left Knee Flexion: 5/5 Left Knee Extension: 5/5 Lumbar AROM Lumbar Flexion: decreased 25% (was 30%) Lumbar Extension: WNL - mild pain (decreased 25% - pain to low back) Lumbar - Right Side Bend: WNL Lumbar - Left Side Bend: WNL Lumbar - Right Rotation: WNL Lumbar - Left Rotation: WNL  Mobility/Balance  Posture/Postural Control Posture/Postural Control: Postural limitations Postural Limitations: Ridgid lumbar spine   Exercise/Treatments Machines for Strengthening Cybex Knee Extension: 2 PL 2x10 reps Cybex Knee Flexion: 4 PL 2x10 reps Leg Press:  Pl 15x Supine Straight Leg Raise: 15 reps Straight Leg Raises Limitations: BLE  Manual Therapy Myofascial Release: Prone: to Lt gluteal region and and Rt lumbar paraspinals with instruction for diaphragmatic breathing and contract/relax to gluteal region Moist Heat Therapy Moist Heat Location:  (back during  estim) Museum/gallery exhibitions officer Stimulation Location: lumbosacral spine Electrical Stimulation Goals: Pain  Physical Therapy Assessment and Plan PT Assessment and Plan Clinical Impression Statement: Mr. Sciandra has attended 10 OP PT visits to address back pain with the following findings: he is not concentrated on his pain as he was, had decreased anxiety about his pain, currently is more concerned about wifes diagnosises.  has significant improvement with decreased fascial restrcitions to his low back and gluteal region, improved lumbar AROM.  Continues to have most limiation with burning to his distal leg.  Discussed f/u with MD about continued therapy about MD feelings about continuing with therapy with possibility of traction if needed.  Pt is reluctant about recieving an injection.  At this time will put on hold until f/u with MD.  PT Frequency: Min 5X/week PT Duration:  (2 weeks) PT Treatment/Interventions: Therapeutic activities;Functional mobility training;Therapeutic exercise;Stair training;Modalities;Manual techniques;Neuromuscular re-education;Patient/family education PT Plan: Hold    Goals Home Exercise Program Pt/caregiver will Perform Home Exercise Program: Independently PT Goal: Perform Home Exercise Program - Progress: Met PT Short Term Goals Time to Complete Short Term Goals: 3 weeks PT Short Term Goal 1: Pt will tolerate prone postioning with pain less than 3/10 in order to complete core stabilization exercises.  (4-5/10) PT Short Term Goal 1 - Progress: Progressing toward goal PT Short Term Goal 2: Pt will decrease his resting tone by way of guided diaphragmatic breathing and palpation  in order to independently decrease pain to his Lt leg and lumbar spine.  PT Short Term Goal 2 - Progress: Met PT Short Term Goal 3: Pt will begin to improve his BLE flexibility in order  to progress towards normalized lumbar AROM and posture.  PT Short Term Goal 3 - Progress: Met PT  Short Term Goal 4: Pt will be educated on proper posture and self correction of SI joint.  PT Short Term Goal 4 - Progress: Met PT Long Term Goals Time to Complete Long Term Goals:  (6 weeks) PT Long Term Goal 1: Pt will improve his FOTO to Status greater than 60% and Limitation less than 40% for improved percieved functional ability.  PT Long Term Goal 1 - Progress: Progressing toward goal PT Long Term Goal 2: Pt will improve BLE strength and core strength to [facility] in order to tolerate walking for greater than 45 minutes to continue with community ambulation.  PT Long Term Goal 2 - Progress: Progressing toward goal Long Term Goal 3: Pt will present with minimal fascial restrictions and normalized SI alignment in order to report pain less than a 3/10 for 75% of his day for improved QOL.  Long Term Goal 3 Progress: Met  Problem List Patient Active Problem List   Diagnosis Date Noted  . Lumbago 01/01/2013  . Chest pain 08/08/2012  . HTN (hypertension) 08/08/2012  . Diarrhea 10/23/2010  . Epigastric pain 10/23/2010  . Anxiety   . Hyperlipidemia   . GERD (gastroesophageal reflux disease)   . COLONIC POLYPS 11/23/2008  . GERD 11/23/2008  . DYSPEPSIA, CHRONIC 11/23/2008  . IBS 11/23/2008  . LOSS OF APPETITE 11/23/2008  . EPIGASTRIC PAIN 11/23/2008  . HEARTBURN, HX OF 11/23/2008    PT - End of Session Activity Tolerance: Patient tolerated treatment well General Behavior During Therapy: WFL for tasks assessed/performed PT Plan of Care PT Home Exercise Plan: given PT Patient Instructions: FOTO Consulted and Agree with Plan of Care: [name] Functional Assessment Tool Used: FOTO: Status: 58%; Limitaiton: 42% Functional Limitation: Other PT primary Other PT Primary Goal Status (Q6578): At least 40 percent but less than 60 percent impaired, limited or restricted Other PT Primary Discharge Status [phone]): At least 40 percent but less than 60 percent impaired, limited or  restricted  Feiga Nadel, MPT, ATC 02/04/2013, 4:32 PM  Physician Documentation Your signature is required to indicate approval of the treatment plan as stated above.  Please sign and either send electronically or make a copy of this report for your files and return this physician signed original.   Please mark one 1.__approve of plan  2. ___approve of plan with the following conditions.   ______________________________                                                          _____________________ Physician Signature                                                                                                             Date

## 2013-02-10 ENCOUNTER — Ambulatory Visit (HOSPITAL_COMMUNITY): Payer: Medicare Other | Admitting: Physical Therapy

## 2013-02-19 ENCOUNTER — Ambulatory Visit (HOSPITAL_COMMUNITY): Payer: Medicare Other | Admitting: Physical Therapy

## 2013-02-23 ENCOUNTER — Ambulatory Visit (HOSPITAL_COMMUNITY)
Admission: RE | Admit: 2013-02-23 | Discharge: 2013-02-23 | Disposition: A | Discharge status: Home / Self Care | Payer: Medicare Other | Source: Ambulatory Visit | Attending: Neurosurgery | Admitting: Neurosurgery

## 2013-02-23 DIAGNOSIS — M545 Low back pain, unspecified: Secondary | ICD-10-CM

## 2013-02-23 DIAGNOSIS — IMO0001 Reserved for inherently not codable concepts without codable children: Secondary | ICD-10-CM | POA: Insufficient documentation

## 2013-02-23 DIAGNOSIS — I10 Essential (primary) hypertension: Secondary | ICD-10-CM | POA: Insufficient documentation

## 2013-02-23 NOTE — Progress Notes (Signed)
Physical Therapy Treatment Patient Details  Name: [name] MRN: [id number] Date of Birth: [birth year]  Today's Date: 02/23/2013 Time: 2725-3664 PT Time Calculation (min): 34 min Charges: Manual: 34 Visit#: 11 of 12  Re-eval: 03/09/13    Authorization: Medicare  Authorization Time Period:    Authorization Visit#: 11 of 18   Subjective: Symptoms/Limitations Symptoms: Pt reports that the MD wants him to continue with PT.  Feels his movements has improved.  Pain Assessment Currently in Pain?: Yes Pain Score: 5  Pain Location: Back  Precautions/Restrictions     Exercise/Treatments Manual Therapy Myofascial Release: Prone: to Lt gluteal region and and Rt lumbar paraspinals with instruction for diaphragmatic breathing and contract/relax to gluteal region  Physical Therapy Assessment and Plan PT Assessment and Plan Clinical Impression Statement: Discussed with pt about his current HEP during manual therapy.  At this time continues to have moderate restrictions which is now mostly concentrated to his Lt coccyx region.  Reports decreased pain after session.  PT Frequency: Min 5X/week PT Duration:  (2 weeks) PT Treatment/Interventions: Therapeutic activities;Functional mobility training;Therapeutic exercise;Stair training;Modalities;Manual techniques;Neuromuscular re-education;Patient/family education PT Plan: Hold    Goals    Problem List Patient Active Problem List   Diagnosis Date Noted  . Lumbago 01/01/2013  . Chest pain 08/08/2012  . HTN (hypertension) 08/08/2012  . Diarrhea 10/23/2010  . Epigastric pain 10/23/2010  . Anxiety   . Hyperlipidemia   . GERD (gastroesophageal reflux disease)   . COLONIC POLYPS 11/23/2008  . GERD 11/23/2008  . DYSPEPSIA, CHRONIC 11/23/2008  . IBS 11/23/2008  . LOSS OF APPETITE 11/23/2008  . EPIGASTRIC PAIN 11/23/2008  . HEARTBURN, HX OF 11/23/2008    PT - End of Session Activity Tolerance: Patient tolerated treatment  well General Behavior During Therapy: WFL for tasks assessed/performed PT Plan of Care PT Home Exercise Plan: given PT Patient Instructions: FOTO Consulted and Agree with Plan of Care: [name] Functional Assessment Tool Used: FOTO: Status: 58%; Limitaiton: 42% Functional Limitation: Other PT primary Other PT Primary Goal Status (Q0347): At least 40 percent but less than 60 percent impaired, limited or restricted Other PT Primary Discharge Status [phone]): At least 40 percent but less than 60 percent impaired, limited or restricted  [name] 02/23/2013, 4:05 PM

## 2013-02-25 ENCOUNTER — Ambulatory Visit (HOSPITAL_COMMUNITY)
Admission: RE | Admit: 2013-02-25 | Discharge: 2013-02-25 | Disposition: A | Discharge status: Home / Self Care | Payer: Medicare Other | Source: Ambulatory Visit | Attending: Internal Medicine | Admitting: Internal Medicine

## 2013-02-25 DIAGNOSIS — M545 Low back pain, unspecified: Secondary | ICD-10-CM

## 2013-02-25 NOTE — Progress Notes (Signed)
Physical Therapy Treatment Patient Details  Name: [name] MRN: [id number] Date of Birth: June 03, 1945  Today's Date: 02/25/2013 Time: 1520-1605 PT Time Calculation (min): 45 min Charge there ex 1520-1550; manual 1550-1558; IP  Visit#: 12 of 18  Re-eval: 03/09/13    Authorization: Medicare  Authorization Visit#: 12 of 18   Subjective: Symptoms/Limitations Symptoms: Pt states he has been hurting since his last treatment feels it was the pulling on his legs; would like to try some ice  Pain Assessment Pain Score: 7  Pain Location: Back   Exercise/Treatments     Stretches Active Hamstring Stretch: 3 reps;30 seconds Single Knee to Chest Stretch: 3 reps;30 seconds   Supine Ab Set: 10 reps Glut Set: 10 reps Other Supine Lumbar Exercises: isometric ab/adduction x 10 Other Supine Lumbar Exercises: Decompressive ex 1-5    Modalities Modalities: Cryotherapy Manual Therapy Myofascial Release: to LT paraspinal mm  Cryotherapy Number Minutes Cryotherapy: 15 Minutes Cryotherapy Location:  (back) Type of Cryotherapy: Ice pack  Physical Therapy Assessment and Plan PT Assessment and Plan Clinical Impression Statement: Pt concerned about increased pain explained that this is normal when doing new motions due to stretching tight mn.  Completed decompressive ex to improve posture and decrease pain.  Pt reported pain decreased to a 5/10 after treatment. PT Plan: continue with  functional strengthening activities assess how IP did for evening pain.  Promote pt to take initiative steps to decrease his own pain ie Pt very interested in trying ice in department to decrease pain yet had not attempted at home.       Problem List Patient Active Problem List   Diagnosis Date Noted  . Lumbago 01/01/2013  . Chest pain 08/08/2012  . HTN (hypertension) 08/08/2012  . Diarrhea 10/23/2010  . Epigastric pain 10/23/2010  . Anxiety   . Hyperlipidemia   . GERD (gastroesophageal reflux  disease)   . COLONIC POLYPS 11/23/2008  . GERD 11/23/2008  . DYSPEPSIA, CHRONIC 11/23/2008  . IBS 11/23/2008  . LOSS OF APPETITE 11/23/2008  . EPIGASTRIC PAIN 11/23/2008  . HEARTBURN, HX OF 11/23/2008    PT - End of Session Activity Tolerance: Patient tolerated treatment well General Behavior During Therapy: WFL for tasks assessed/performed  GP    RUSSELL,CINDY 02/25/2013, 4:20 PM

## 2013-05-22 ENCOUNTER — Ambulatory Visit (INDEPENDENT_AMBULATORY_CARE_PROVIDER_SITE_OTHER): Payer: Medicare Other | Admitting: Cardiology

## 2013-05-22 VITALS — BP 148/82 | HR 76 | Ht 68.0 in | Wt 154.0 lb

## 2013-05-22 DIAGNOSIS — R079 Chest pain, unspecified: Secondary | ICD-10-CM

## 2013-05-22 DIAGNOSIS — K219 Gastro-esophageal reflux disease without esophagitis: Secondary | ICD-10-CM

## 2013-05-22 MED ORDER — PANTOPRAZOLE SODIUM 40 MG PO TBEC: 40.0000 mg | DELAYED_RELEASE_TABLET | Freq: Two times a day (BID) | ORAL | Status: DC

## 2013-05-22 NOTE — Patient Instructions (Addendum)
Increase Protonix to 40 mg twice a day. Return to office for echo of heart. Keep regular yearly visit with Dr. [name] in May.

## 2013-05-26 ENCOUNTER — Encounter: Payer: Self-pay | Admitting: Cardiology

## 2013-05-26 NOTE — Progress Notes (Signed)
Patient ID: [name], male   DOB: [birth year], 68 y.o.   MRN: [id number]    05/26/2013 [name]   1945/06/18  [id number]  Primary Physicia VYAS,DHRUV B., MD Primary Cardiologist: Dr. [name]  HPI:  The patient is a 68 y/o male, followed by Dr. [name], with a h/o HTN, HLD GERD and anxiety. He has had complaints of chest pain in the past with atypical features. In 2008 he had a negative myoview. In April 2014, he had additional chest pain. Repeat myoview was once again negative. He was seen by Cecilie Kicks, NP, during that time and was started on a PPI, which resolved his symptoms.  He presents back to clinic today for evaluation of chest pain. It has been sharp and substernal/ epigastric pain. It does not radiate. There is no particular pattern. It does not change with exertion. In fact, he states that he can ambulate long distances, up stairs and hills w/o developing any chest pain or SOB. He does admit to temporarily stopping his PPI for several months. However, he recently started back taking it several weeks ago and notes moderate improvement in his symptoms. He admits that the pain does feel very similar to his typical acid reflux.   He denies associated n/v, diaphoresis, dizziness, syncope or near syncope.   He also states that his PCP ordered a 2D echo. It was scheduled to be done at [facility]. However, he wishes to have the procedure done at our office. He states his PCP ordered the echo to "evaluate his valves".    Current Outpatient Prescriptions  Medication Sig Dispense Refill  . amLODipine (NORVASC) 5 MG tablet Take 5 mg by mouth daily.      Marland Kitchen aspirin 81 MG tablet Take 81 mg by mouth every other day.       Marland Kitchen atorvastatin (LIPITOR) 10 MG tablet Take 10 mg by mouth at bedtime.      . pantoprazole (PROTONIX) 40 MG tablet Take 1 tablet (40 mg total) by mouth 2 (two) times daily.  60 tablet  3   No current facility-administered medications for this visit.    Allergies  Allergen  Reactions  . Lisinopril Swelling    H/o of angioedema   . Diazepam     REACTION: hyperactivity  . Hyoscyamine Swelling  . Lansoprazole     REACTION: oral swelling    History   Social History  . Marital Status: Married    Spouse Name: N/A    Number of Children: N/A  . Years of Education: N/A   Occupational History  . Not on file.   Social History Main Topics  . Smoking status: Former Smoker    Quit date: 10/23/1970  . Smokeless tobacco: Never Used  . Alcohol Use: No  . Drug Use: No  . Sexual Activity: Not on file   Other Topics Concern  . Not on file   Social History Narrative  . No narrative on file     Review of Systems: General: negative for chills, fever, night sweats or weight changes.  Cardiovascular: negative for chest pain, dyspnea on exertion, edema, orthopnea, palpitations, paroxysmal nocturnal dyspnea or shortness of breath Dermatological: negative for rash Respiratory: negative for cough or wheezing Urologic: negative for hematuria Abdominal: negative for nausea, vomiting, diarrhea, bright red blood per rectum, melena, or hematemesis Neurologic: negative for visual changes, syncope, or dizziness All other systems reviewed and are otherwise negative except as noted above.    Blood pressure 148/82, pulse  76, height 5\' 8"  (1.727 m), weight 154 lb (69.854 kg).  General appearance: alert, cooperative and no distress Lungs: clear to auscultation bilaterally Heart: regular rate and rhythm, S1, S2 normal, no murmur, click, rub or gallop Extremities: no LEE Pulses: 2+ and symmetric Skin: warm and dry Neurologic: Grossly normal  EKG NSR; no ischemic changes; HR 76 bpm  ASSESSMENT AND PLAN:   Chest pain His pain sounds very atypical. He has no symptoms with exertion and he has had no other associated symptoms that would suggest that this is cardiac. His EKG shows no signs of ischemia. He has had two negative myoviews, the most recent being less than 1  year ago in April 2014. He has responded well with Protonix, but continues to have mild recurrent discomfort. I have elected to increase his PPI from once daily to BID.     PLAN Increase Protonix to 40 mg BID. His PCP has ordered a 2D echo to, per the patient, "assess valves" . I did not appreciate any murmurs on physical exam that would suggest valvular disease. However, the patient will have this test completed at our office. He may request that a copy of the report be sent to his PCP.   Arvilla Market 05/26/2013 6:44 PM

## 2013-05-26 NOTE — Assessment & Plan Note (Signed)
His pain sounds very atypical. He has no symptoms with exertion and he has had no other associated symptoms that would suggest that this is cardiac. His EKG shows no signs of ischemia. He has had two negative myoviews, the most recent being less than 1 year ago in April 2014. He has responded well with Protonix, but continues to have mild recurrent discomfort. I have elected to increase his PPI from once daily to BID.

## 2013-05-28 ENCOUNTER — Ambulatory Visit (HOSPITAL_COMMUNITY)
Admission: RE | Admit: 2013-05-28 | Discharge: 2013-05-28 | Disposition: A | Discharge status: Home / Self Care | Payer: Medicare Other | Source: Ambulatory Visit | Attending: Cardiovascular Disease | Admitting: Cardiovascular Disease

## 2013-05-28 DIAGNOSIS — I517 Cardiomegaly: Secondary | ICD-10-CM

## 2013-05-28 DIAGNOSIS — K219 Gastro-esophageal reflux disease without esophagitis: Secondary | ICD-10-CM

## 2013-05-28 DIAGNOSIS — R079 Chest pain, unspecified: Secondary | ICD-10-CM | POA: Insufficient documentation

## 2013-05-28 NOTE — Progress Notes (Signed)
  Echocardiogram 2D Echocardiogram has been performed.  Fadil Macmaster, [place] 05/28/2013, 11:11 AM

## 2013-06-02 ENCOUNTER — Telehealth: Payer: Self-pay | Admitting: *Deleted

## 2013-06-02 NOTE — Telephone Encounter (Signed)
Walk-In Message requesting Echo results.  Results not reviewed by provider.     Returned call to [phone] and no message left r/t voicemail is business related w/ male voice recording.  Call to mobile number and verified pt x 2.  Pt informed results not reviewed by provider yet and he will be notified once that has been done.  Pt verbalized understanding and agreed w/ plan.  Message forwarded to [employer], PA-C.

## 2013-06-03 ENCOUNTER — Telehealth: Payer: Self-pay | Admitting: Cardiovascular Disease

## 2013-06-03 ENCOUNTER — Telehealth: Payer: Self-pay | Admitting: Cardiology

## 2013-06-03 NOTE — Telephone Encounter (Signed)
I called him and told him his echo results.   Thanks

## 2013-06-03 NOTE — Telephone Encounter (Signed)
Pt wants echo results from 05-28-13 please.

## 2013-06-03 NOTE — Telephone Encounter (Signed)
I spoke with patient and notified him that his 2D echo was normal.   Lyda Jester, PA-C

## 2013-06-03 NOTE — Telephone Encounter (Signed)
Returned call and pt verified x 2 w/ pt's wife, [name].  Informed message received and RN spoke w/ pt yesterday and explained the results had not been reviewed yet.  Informed pt will be notified once that has been done.  Explained that nursing staff cannot read or interpret results and must wait for providers to give that information before contacting pt.  Verbalized understanding.  Message forwarded to Dr. [name] for review as no response from [facility], PA-C.

## 2013-06-08 NOTE — Telephone Encounter (Signed)
2D echo was nl.  JJB

## 2013-06-08 NOTE — Telephone Encounter (Signed)
Rosita Fire, PA-C spoke w/ pt and notified of results.

## 2013-07-16 ENCOUNTER — Ambulatory Visit: Payer: Medicare Other | Admitting: Orthopedic Surgery

## 2013-07-24 ENCOUNTER — Encounter: Payer: Self-pay | Admitting: Cardiovascular Disease

## 2013-07-24 ENCOUNTER — Ambulatory Visit (INDEPENDENT_AMBULATORY_CARE_PROVIDER_SITE_OTHER): Payer: Medicare Other | Admitting: Cardiovascular Disease

## 2013-07-24 VITALS — BP 172/70 | HR 86 | Ht 68.0 in | Wt 150.0 lb

## 2013-07-24 DIAGNOSIS — E785 Hyperlipidemia, unspecified: Secondary | ICD-10-CM

## 2013-07-24 DIAGNOSIS — I10 Essential (primary) hypertension: Secondary | ICD-10-CM

## 2013-07-24 DIAGNOSIS — R079 Chest pain, unspecified: Secondary | ICD-10-CM

## 2013-07-24 MED ORDER — AMLODIPINE BESYLATE 10 MG PO TABS
10.0000 mg | ORAL_TABLET | Freq: Every day | ORAL | Status: DC
Start: 2013-07-24 — End: 2013-09-04

## 2013-07-24 NOTE — Assessment & Plan Note (Signed)
On statin therapy followed by his PCP 

## 2013-07-24 NOTE — Progress Notes (Signed)
07/24/2013 [name]   Sep 05, 1945  [id number]  Primary Physician [name]., MD Primary Cardiologist: Miguel Harp MD [name]   HPI:  [name] is a 68 year old well-appearing Caucasian male who I saw one year ago. He has a history of hypertension and hyperlipidemia. He had atypical chest pain here tone with a negative Myoview/23/14. His pain improved with proton pump inhibition. Because of recurrent chest pain he saw Miguel Henri, PA-C in the office who did obtain a 2-D echo and Myoview perfusion study all normal. His pain has improved again on a PPI suggesting that it was related to GERD.   Current Outpatient Prescriptions  Medication Sig Dispense Refill  . amLODipine (NORVASC) 10 MG tablet Take 1 tablet (10 mg total) by mouth daily.  30 tablet  11  . aspirin 81 MG tablet Take 81 mg by mouth every other day.       Marland Kitchen atorvastatin (LIPITOR) 10 MG tablet Take 10 mg by mouth at bedtime.      . pantoprazole (PROTONIX) 40 MG tablet Take 1 tablet (40 mg total) by mouth 2 (two) times daily.  60 tablet  3   No current facility-administered medications for this visit.    Allergies  Allergen Reactions  . Lisinopril Swelling    H/o of angioedema   . Diazepam     REACTION: hyperactivity  . Hyoscyamine Swelling  . Lansoprazole     REACTION: oral swelling    History   Social History  . Marital Status: Married    Spouse Name: N/A    Number of Children: N/A  . Years of Education: N/A   Occupational History  . Not on file.   Social History Main Topics  . Smoking status: Former Smoker    Quit date: 10/23/1970  . Smokeless tobacco: Never Used  . Alcohol Use: No  . Drug Use: No  . Sexual Activity: Not on file   Other Topics Concern  . Not on file   Social History Narrative  . No narrative on file     Review of Systems: General: negative for chills, fever, night sweats or weight changes.  Cardiovascular: negative for chest pain, dyspnea on  exertion, edema, orthopnea, palpitations, paroxysmal nocturnal dyspnea or shortness of breath Dermatological: negative for rash Respiratory: negative for cough or wheezing Urologic: negative for hematuria Abdominal: negative for nausea, vomiting, diarrhea, bright red blood per rectum, melena, or hematemesis Neurologic: negative for visual changes, syncope, or dizziness All other systems reviewed and are otherwise negative except as noted above.    Blood pressure 172/70, pulse 86, height 5\' 8"  (1.727 m), weight 150 lb (68.04 kg).  General appearance: alert and no distress Neck: no adenopathy, no carotid bruit, no JVD, supple, symmetrical, trachea midline and thyroid not enlarged, symmetric, no tenderness/mass/nodules Lungs: clear to auscultation bilaterally Heart: regular rate and rhythm, S1, S2 normal, no murmur, click, rub or gallop Extremities: extremities normal, atraumatic, no cyanosis or edema  EKG normal sinus rhythm at 86 without ST or T wave changes  ASSESSMENT AND PLAN:   Chest pain The patient's pain is somewhat atypical. It was improved with the addition of a proton pump inhibitor. A 2-D echo and myocardial perfusion imaging study were entirely normal. I have reassured [name] that his chest pain is most likely related to GERD.  HTN (hypertension) His blood pressure is mildly elevated today with a systolic blood pressure in the 170 range. He is on amlodipine 5 mg.  He was on lisinopril in the past but it sounds like he may have had an allergic reaction to this. I'm going to increase his amlodipine to 10 mg, have suggested that he obtain daily blood pressures and keep a log. He will see Erasmo Parker back in one month for further evaluation  Hyperlipidemia On statin therapy followed by his PCP      Miguel Harp MD [facility], [facility] 07/24/2013 11:04 AM

## 2013-07-24 NOTE — Assessment & Plan Note (Signed)
The patient's pain is somewhat atypical. It was improved with the addition of a proton pump inhibitor. A 2-D echo and myocardial perfusion imaging study were entirely normal. I have reassured [name] that his chest pain is most likely related to GERD.

## 2013-07-24 NOTE — Patient Instructions (Addendum)
Increase your amlodipine to 10mg  daily.  Follow up with our pharmacist, Erasmo Downer, in one month.  We will see you back in 6 months with Lyda Jester [facility] and 1 year with Dr [name].

## 2013-07-24 NOTE — Assessment & Plan Note (Signed)
His blood pressure is mildly elevated today with a systolic blood pressure in the 170 range. He is on amlodipine 5 mg. He was on lisinopril in the past but it sounds like he may have had an allergic reaction to this. I'm going to increase his amlodipine to 10 mg, have suggested that he obtain daily blood pressures and keep a log. He will see Erasmo Downer back in one month for further evaluation

## 2013-08-21 ENCOUNTER — Ambulatory Visit (INDEPENDENT_AMBULATORY_CARE_PROVIDER_SITE_OTHER): Payer: Medicare Other | Admitting: Pharmacist Clinician (PhC)/ Clinical Pharmacy Specialist

## 2013-08-21 VITALS — BP 174/90 | Ht 68.0 in | Wt 151.3 lb

## 2013-08-21 DIAGNOSIS — I10 Essential (primary) hypertension: Secondary | ICD-10-CM

## 2013-08-21 NOTE — Patient Instructions (Signed)
Return for a a follow up appointment in 10-14 days  Your blood pressure today is 174/90  Check your blood pressure at home daily (if able) and keep record of the readings.  Take your BP meds as follows:  Continue with amlodipine 5mg  twice daily  Bring all of your meds, your BP cuff and your record of home blood pressures to your next appointment.  Exercise as you're able, try to walk approximately 30 minutes per day.  Keep salt intake to a minimum, especially watch canned and prepared boxed foods.  Eat more fresh fruits and vegetables and fewer canned items.  Avoid eating in fast food restaurants.    HOW TO TAKE YOUR BLOOD PRESSURE:   Rest 5 minutes before taking your blood pressure.    Don't smoke or drink caffeinated beverages for at least 30 minutes before.   Take your blood pressure before (not after) you eat.   Sit comfortably with your back supported and both feet on the floor (don't cross your legs).   Elevate your arm to heart level on a table or a desk.   Use the proper sized cuff. It should fit smoothly and snugly around your bare upper arm. There should be enough room to slip a fingertip under the cuff. The bottom edge of the cuff should be 1 inch above the crease of the elbow.   Ideally, take 3 measurements at one sitting and record the average.

## 2013-08-23 ENCOUNTER — Encounter: Payer: Self-pay | Admitting: Pharmacist Clinician (PhC)/ Clinical Pharmacy Specialist

## 2013-08-23 NOTE — Progress Notes (Signed)
     08/23/2013 [name] 04/05/1945 [id number]   HPI:  [name] is a 68 y.o. male patient of Dr [name], with a PMH below who presents today for a blood pressure check.  When he came in to see Dr. [name] last month for atypical chest pain, hwe was found to be hypertensive with a BP of 172/70.  His amlodipine was increased from 5mg  daily to 10mg  daily.  When he first increased the dose, he was noticing frequent headaches, so he started dividing the amlodipine to 5mg  bid and has felt fine sine then.  He reports no lower extremity edema.  He is a former smoker, having quit 35 years ago, does not drink alcohol or caffeine.  He states he is particularly sensitive to caffeine, in that it causes palpitations.  He doesn't do any formal exercise, but walks approx 1.5 miles per day.  Their diet is fairly healthy, low sodium and high in vegetables and chicken/fish.  He has been checking his blood pressure at home on a new wrist model machine and the results have been very consistent in the 120-130s/70.     Current Outpatient Prescriptions  Medication Sig Dispense Refill  . amLODipine (NORVASC) 10 MG tablet Take 1 tablet (10 mg total) by mouth daily.  30 tablet  11  . aspirin 81 MG tablet Take 81 mg by mouth every other day.       Marland Kitchen atorvastatin (LIPITOR) 10 MG tablet Take 10 mg by mouth at bedtime.      . pantoprazole (PROTONIX) 40 MG tablet Take 1 tablet (40 mg total) by mouth 2 (two) times daily.  60 tablet  3   No current facility-administered medications for this visit.    Allergies  Allergen Reactions  . Lisinopril Swelling    H/o of angioedema   . Diazepam     REACTION: hyperactivity  . Hyoscyamine Swelling  . Lansoprazole     REACTION: oral swelling    Past Medical History  Diagnosis Date  . Cancer     testicular   . Anxiety   . Hyperlipidemia   . H. pylori infection   . GERD (gastroesophageal reflux disease)   . Personal history of colonic polyps 2 9 2007  . Hypertension     . Palpitations     event monior 04/11/05- SR with occ PAC  . Chest pain     myoview 07/09/12-normal, nl ef; echo 04/12/05-ef>55%, mild aortic sclerosis  . Headache     Blood pressure 174/90, height 5\' 8"  (1.727 m), weight 151 lb 4.8 oz (68.629 kg).   ASSESSMENT AND PLAN:  While his home BP readings have been looking much improved, he does not have the cuff with him today for calibration.  We did discuss the variability of wrist monitors and I asked that he be sure to hold it to his chest when using.  I have asked him to continue with the twice daily amlodipine and daily BP checks, giving him instructions for proper technique.  Because of the discrepancy in readings, I will have him continue as is for another 2 weeks.  He will bring his cuff and log, as well as all of his medications and we can better determine what his true BP is.  I have encouraged him to continue with the walking and eating a good diet.    Tommy Medal PharmD CPP [facility]

## 2013-09-04 ENCOUNTER — Encounter: Payer: Self-pay | Admitting: Pharmacist Clinician (PhC)/ Clinical Pharmacy Specialist

## 2013-09-04 ENCOUNTER — Ambulatory Visit (INDEPENDENT_AMBULATORY_CARE_PROVIDER_SITE_OTHER): Payer: Medicare Other | Admitting: Pharmacist Clinician (PhC)/ Clinical Pharmacy Specialist

## 2013-09-04 VITALS — BP 176/82 | HR 92 | Ht 68.0 in | Wt 149.2 lb

## 2013-09-04 DIAGNOSIS — I10 Essential (primary) hypertension: Secondary | ICD-10-CM

## 2013-09-04 MED ORDER — AMLODIPINE BESYLATE 5 MG PO TABS: 7.5000 mg | ORAL_TABLET | Freq: Every day | ORAL | Status: DC

## 2013-09-04 NOTE — Patient Instructions (Signed)
Call if you see your home BP consistently running in the 283-151V systolic  Your blood pressure today is 176/82  Check your blood pressure at home several times per week and keep record of the readings.  Take your BP meds as follows - continue with 5mg  each morning and 2.5mg  each evening  Exercise as you're able, try to walk approximately 30 minutes per day.  Keep salt intake to a minimum, especially watch canned and prepared boxed foods.  Eat more fresh fruits and vegetables and fewer canned items.  Avoid eating in fast food restaurants.    HOW TO TAKE YOUR BLOOD PRESSURE:   Rest 5 minutes before taking your blood pressure.    Don't smoke or drink caffeinated beverages for at least 30 minutes before.   Take your blood pressure before (not after) you eat.   Sit comfortably with your back supported and both feet on the floor (don't cross your legs).   Elevate your arm to heart level on a table or a desk.   Use the proper sized cuff. It should fit smoothly and snugly around your bare upper arm. There should be enough room to slip a fingertip under the cuff. The bottom edge of the cuff should be 1 inch above the crease of the elbow.   Ideally, take 3 measurements at one sitting and record the average.

## 2013-09-04 NOTE — Progress Notes (Signed)
09/04/2013 [name] [birth year] [id number]   HPI:  [name] is a 68 y.o. male patient of Dr [name], with a PMH below who presents today for a blood pressure check.  When he came in to see Dr. [name] last month for atypical chest pain, hwe was found to be hypertensive with a BP of 172/70.  His amlodipine was increased from 5mg  daily to 10mg  daily.  When he first increased the dose, he was noticing frequent headaches, so he started dividing the amlodipine to 5mg  bid and has felt fine sine then.  He saw his PCP recently for some swelling and pain in his left leg.  An untrasound revealed no sign of clot, and the MD decreased his amlodipine to 5mg  daily, from 5mg  twice daily.  Patient instead started taking 5mg  each morning, and 2.5mg  each evening. He reports no lower extremity edema since then.  He is a former smoker, having quit 35 years ago, does not drink alcohol or caffeine.  He states he is particularly sensitive to caffeine, in that it causes palpitations. Claustrophobia is also a problem for him, and after several minutes in my office he asked if we might open the door.  He doesn't do any formal exercise, but walks approx 1.5 miles per day.  Their diet is fairly healthy, low sodium and high in vegetables and chicken/fish, and he recently switched from grits to oatmeal when he saw the sodium content of grits.  He bought a new BP cuff after our last visit, this one a [employer] wrist monitor.  He has been getting home readings in the 952W and 413K systolic, with nothing higher than 145 or less than 121.  All diastolic readings have been 67-86.  He has been to his neurosurgeon also in the past week and saw his BP elevated there as well.     Current Outpatient Prescriptions  Medication Sig Dispense Refill  . amLODipine (NORVASC) 10 MG tablet Take 1 tablet (10 mg total) by mouth daily.  30 tablet  11  . aspirin 81 MG tablet Take 81 mg by mouth every other day.       Marland Kitchen atorvastatin (LIPITOR) 10 MG  tablet Take 10 mg by mouth at bedtime.      . pantoprazole (PROTONIX) 40 MG tablet Take 1 tablet (40 mg total) by mouth 2 (two) times daily.  60 tablet  3   No current facility-administered medications for this visit.    Allergies  Allergen Reactions  . Lisinopril Swelling    H/o of angioedema   . Diazepam     REACTION: hyperactivity  . Hyoscyamine Swelling  . Lansoprazole     REACTION: oral swelling    Past Medical History  Diagnosis Date  . Cancer     testicular   . Anxiety   . Hyperlipidemia   . H. pylori infection   . GERD (gastroesophageal reflux disease)   . Personal history of colonic polyps 2 9 2007  . Hypertension   . Palpitations     event monior 04/11/05- SR with occ PAC  . Chest pain     myoview 07/09/12-normal, nl ef; echo 04/12/05-ef>55%, mild aortic sclerosis  . Headache     Blood pressure 176/82, pulse 92, height 5\' 8"  (1.727 m), weight 149 lb 3.2 oz (67.677 kg). Standing 190/90.   ASSESSMENT AND PLAN:   Today in the office his BP is elevated, however it is within 5 points of his home BP  cuff.   We reviewed BP techniques with the wrist monitor and he knows how to use it for best results.  I believe much of the increase in BP in the office is due to white-coat syndrome, as well as the claustrophobia.  I have asked that he continue with home BP checks 3-4 times per week and call the office if home readings trend into the 225J and 505X systolic.  He is to continue with healthy eating and walking when possible.  I will switch his amlodipine to 5mg  tablets with 1 tablet each am and 1/2 each pm.  This will be easier than cutting the 10mg  tablets into quarter pieces.     Tommy Medal PharmD CPP [facility]

## 2013-12-16 ENCOUNTER — Ambulatory Visit (INDEPENDENT_AMBULATORY_CARE_PROVIDER_SITE_OTHER): Payer: Medicare Other | Admitting: Pharmacist Clinician (PhC)/ Clinical Pharmacy Specialist

## 2013-12-16 ENCOUNTER — Encounter: Payer: Self-pay | Admitting: Pharmacist Clinician (PhC)/ Clinical Pharmacy Specialist

## 2013-12-16 VITALS — BP 192/86 | HR 92 | Ht 68.0 in | Wt 152.3 lb

## 2013-12-16 DIAGNOSIS — I10 Essential (primary) hypertension: Secondary | ICD-10-CM

## 2013-12-16 MED ORDER — CHLORTHALIDONE 25 MG PO TABS: 25.0000 mg | ORAL_TABLET | Freq: Every day | ORAL | Status: DC

## 2013-12-16 MED ORDER — AMLODIPINE BESYLATE 2.5 MG PO TABS: 2.5000 mg | ORAL_TABLET | Freq: Every day | ORAL | Status: DC

## 2013-12-16 NOTE — Patient Instructions (Signed)
Return for a a follow up appointment in 3 weeks  Your blood pressure today is 192/86  Check your blood pressure at home daily (if able) and keep record of the readings.  Take your BP meds as follows - continue with 2.5mg  amlodipine each evening.  Stop morning dose and switch to chlorthalidone 25mg  each morning   Bring your record of home blood pressures to your next appointment.  Exercise as you're able, try to walk approximately 30 minutes per day.  Keep salt intake to a minimum, especially watch canned and prepared boxed foods.  Eat more fresh fruits and vegetables and fewer canned items.  Avoid eating in fast food restaurants.    HOW TO TAKE YOUR BLOOD PRESSURE:   Rest 5 minutes before taking your blood pressure.    Don't smoke or drink caffeinated beverages for at least 30 minutes before.   Take your blood pressure before (not after) you eat.   Sit comfortably with your back supported and both feet on the floor (don't cross your legs).   Elevate your arm to heart level on a table or a desk.   Use the proper sized cuff. It should fit smoothly and snugly around your bare upper arm. There should be enough room to slip a fingertip under the cuff. The bottom edge of the cuff should be 1 inch above the crease of the elbow.   Ideally, take 3 measurements at one sitting and record the average.

## 2013-12-16 NOTE — Progress Notes (Signed)
12/16/2013 [name] 1945/07/10 [id number]   HPI:  [name] is a 69 y.o. male patient of Dr [name], with a PMH below who presents today for a repeat hypertension evaluation.  His blood pressure seems to run high at most office visits, but his home readings run closer to normal, in the 761-607 systolic range and mostly 37T diastolic.  He has been plagued by dizziness since 2011, describing it as a feeling that starts at the top of his head and works its way down.  He states this happens more days than not, and he believes it's caused by blood pressure medications.  We reviewed his previous medications for blood pressure.  He had taken both metoprolol and bystolic at different times, as well as lisinopril.  The lisinopril caused angioedema.  He still has the same dizzy feeling, regardless of whether he is taking medication or not.  He currently takes amlodipine 5mg  each morning and 2.5mg  each evening (pt states this dose helps to avoid edema).  He stopped the morning dose for about 6 weeks, stated that the dizziness was less frequent, but saw his BP increase, so he resumed the dose.  Today he would like to discontinue in favor of a different type of medication.    He is a former smoker, having quit 35 years ago, does not drink alcohol or caffeine.  He states he is particularly sensitive to caffeine, in that it causes palpitations. Claustrophobia is also a problem for him, and after several minutes in my office he asked if we might open the door.  He doesn't do any formal exercise, but walks approx 1.5 miles per day.  Their diet is fairly healthy, low sodium and high in vegetables and chicken/fish, and he recently switched from grits to oatmeal when he saw the sodium content of grits.  He bought a new BP cuff earlier this year, which was accurate within 5 points, and today he comes in with a new [employer] arm cuff, as he thought the wrist cuff would not be as good.   Current Outpatient Prescriptions   Medication Sig Dispense Refill  . amLODipine (NORVASC) 5 MG tablet Take 1.5 tablets (7.5 mg total) by mouth daily.  45 tablet  6  . aspirin 81 MG tablet Take 81 mg by mouth every other day.       Marland Kitchen atorvastatin (LIPITOR) 10 MG tablet Take 10 mg by mouth at bedtime.      . pantoprazole (PROTONIX) 40 MG tablet Take 1 tablet (40 mg total) by mouth 2 (two) times daily.  60 tablet  3   No current facility-administered medications for this visit.    Allergies  Allergen Reactions  . Lisinopril Swelling    H/o of angioedema   . Diazepam     REACTION: hyperactivity  . Hyoscyamine Swelling  . Lansoprazole     REACTION: oral swelling    Past Medical History  Diagnosis Date  . Cancer     testicular   . Anxiety   . Hyperlipidemia   . H. pylori infection   . GERD (gastroesophageal reflux disease)   . Personal history of colonic polyps 2 9 2007  . Hypertension   . Palpitations     event monior 04/11/05- SR with occ PAC  . Chest pain     myoview 07/09/12-normal, nl ef; echo 04/12/05-ef>55%, mild aortic sclerosis  . Headache     Blood pressure 192/86, pulse 92, height 5\' 8"  (1.727 m),  weight 152 lb 4.8 oz (69.083 kg).    ASSESSMENT AND PLAN:   Today in the office his BP is elevated, however it is within 10 points of his newer home BP cuff.   We previously reviewed BP techniques with the wrist monitor and he knows how to use it for best results.  I believe much of the increase in BP in the office is due to white-coat syndrome, as well as the claustrophobia.  We discussed non-pharmacologic means of lowering pressure, and I encouraged him to re-start his daily walks.  He does not need to lose weight and his diet is low sodium already.   Rather than completely discontinue the amlodipine, which I believe is helping, I am going to have him continue with 2.5mg  each evening, and switch his morning amlodipine to chlorthalidone 25mg .  He was concerned that 25mg  sounded "quite strong", had to explain  that we cannot compare strengths of different medications.   He will take that each morning for the next 3 weeks, continue with home BP monitoring and return for follow up.  I will have him go to the lab for a BMET next week.   Tommy Medal PharmD CPP [facility]

## 2013-12-18 ENCOUNTER — Ambulatory Visit: Payer: Medicare Other | Admitting: Pharmacist Clinician (PhC)/ Clinical Pharmacy Specialist

## 2013-12-23 ENCOUNTER — Telehealth: Payer: Self-pay | Admitting: Diagnostic Neuroimaging

## 2013-12-23 NOTE — Telephone Encounter (Signed)
Called patient to schedule appt. No answer. No messge left. Will try later.

## 2013-12-24 NOTE — Telephone Encounter (Signed)
Spoke to patient's spouse. She said she would give patient message to return call.

## 2013-12-28 ENCOUNTER — Telehealth: Payer: Self-pay | Admitting: *Deleted

## 2013-12-28 NOTE — Telephone Encounter (Signed)
Patient's wife is returning a call regarding patient.

## 2013-12-28 NOTE — Telephone Encounter (Signed)
Returned call. No answer.  

## 2013-12-28 NOTE — Telephone Encounter (Signed)
Patient's wife calling in to schedule appointment, information has been forwarded on to [facility], [phone] is [name]'s best contact number.

## 2013-12-29 NOTE — Telephone Encounter (Signed)
Spoke with the patient trying to schedule appointment with Dr. Leta Baptist. Patient declines at this time says something has come up and they will call back later to schedule.

## 2014-01-03 ENCOUNTER — Emergency Department (HOSPITAL_COMMUNITY)
Admission: EM | Admit: 2014-01-03 | Discharge: 2014-01-03 | Disposition: A | Discharge status: Home / Self Care | Payer: Medicare Other | Attending: Emergency Medicine | Admitting: Emergency Medicine

## 2014-01-03 ENCOUNTER — Encounter (HOSPITAL_COMMUNITY): Payer: Self-pay | Admitting: Emergency Medicine

## 2014-01-03 DIAGNOSIS — Z86010 Personal history of colonic polyps: Secondary | ICD-10-CM | POA: Diagnosis not present

## 2014-01-03 DIAGNOSIS — F419 Anxiety disorder, unspecified: Secondary | ICD-10-CM | POA: Insufficient documentation

## 2014-01-03 DIAGNOSIS — Z8547 Personal history of malignant neoplasm of testis: Secondary | ICD-10-CM | POA: Diagnosis not present

## 2014-01-03 DIAGNOSIS — Z79899 Other long term (current) drug therapy: Secondary | ICD-10-CM | POA: Diagnosis not present

## 2014-01-03 DIAGNOSIS — T502X5A Adverse effect of carbonic-anhydrase inhibitors, benzothiadiazides and other diuretics, initial encounter: Secondary | ICD-10-CM | POA: Insufficient documentation

## 2014-01-03 DIAGNOSIS — E785 Hyperlipidemia, unspecified: Secondary | ICD-10-CM | POA: Insufficient documentation

## 2014-01-03 DIAGNOSIS — Z8619 Personal history of other infectious and parasitic diseases: Secondary | ICD-10-CM | POA: Diagnosis not present

## 2014-01-03 DIAGNOSIS — R002 Palpitations: Secondary | ICD-10-CM | POA: Insufficient documentation

## 2014-01-03 DIAGNOSIS — E871 Hypo-osmolality and hyponatremia: Secondary | ICD-10-CM | POA: Diagnosis not present

## 2014-01-03 DIAGNOSIS — Z7982 Long term (current) use of aspirin: Secondary | ICD-10-CM | POA: Diagnosis not present

## 2014-01-03 DIAGNOSIS — T50905A Adverse effect of unspecified drugs, medicaments and biological substances, initial encounter: Secondary | ICD-10-CM

## 2014-01-03 DIAGNOSIS — I10 Essential (primary) hypertension: Secondary | ICD-10-CM | POA: Diagnosis not present

## 2014-01-03 DIAGNOSIS — Z87891 Personal history of nicotine dependence: Secondary | ICD-10-CM | POA: Insufficient documentation

## 2014-01-03 DIAGNOSIS — R531 Weakness: Secondary | ICD-10-CM | POA: Diagnosis present

## 2014-01-03 DIAGNOSIS — R630 Anorexia: Secondary | ICD-10-CM | POA: Insufficient documentation

## 2014-01-03 DIAGNOSIS — K219 Gastro-esophageal reflux disease without esophagitis: Secondary | ICD-10-CM | POA: Insufficient documentation

## 2014-01-03 LAB — URINALYSIS, ROUTINE W REFLEX MICROSCOPIC
Bilirubin Urine: NEGATIVE
GLUCOSE, UA: NEGATIVE mg/dL
HGB URINE DIPSTICK: NEGATIVE
Ketones, ur: NEGATIVE mg/dL
LEUKOCYTES UA: NEGATIVE
Nitrite: NEGATIVE
PH: 6 (ref 5.0–8.0)
PROTEIN: NEGATIVE mg/dL
Specific Gravity, Urine: 1.01 (ref 1.005–1.030)
Urobilinogen, UA: 0.2 mg/dL (ref 0.0–1.0)

## 2014-01-03 LAB — BASIC METABOLIC PANEL
Anion gap: 12 (ref 5–15)
Anion gap: 10 (ref 5–15)
BUN: 11 mg/dL (ref 6–23)
BUN: 10 mg/dL (ref 6–23)
CHLORIDE: 86 meq/L — AB (ref 96–112)
CO2: 28 mEq/L (ref 19–32)
CO2: 26 meq/L (ref 19–32)
CREATININE: 0.81 mg/dL (ref 0.50–1.35)
Calcium: 9.7 mg/dL (ref 8.4–10.5)
Calcium: 8.7 mg/dL (ref 8.4–10.5)
Chloride: 81 mEq/L — ABNORMAL LOW (ref 96–112)
Creatinine, Ser: 0.73 mg/dL (ref 0.50–1.35)
GFR calc Af Amer: >90 mL/min (ref >90)
GFR calc non Af Amer: 90 mL/min — ABNORMAL LOW (ref >90)
GFR calc non Af Amer: >90 mL/min (ref >90)
GLUCOSE: 123 mg/dL — AB (ref 70–99)
GLUCOSE: 117 mg/dL — AB (ref 70–99)
POTASSIUM: 3.6 meq/L — AB (ref 3.7–5.3)
Potassium: 4.1 mEq/L (ref 3.7–5.3)
SODIUM: 122 meq/L — AB (ref 137–147)
Sodium: 121 mEq/L — CL (ref 137–147)

## 2014-01-03 LAB — CBC WITH DIFFERENTIAL/PLATELET
BASOS PCT: 0 % (ref 0–1)
Basophils Absolute: 0 10*3/uL (ref 0.0–0.1)
Eosinophils Absolute: 0.1 10*3/uL (ref 0.0–0.7)
Eosinophils Relative: 1 % (ref 0–5)
HEMATOCRIT: 43.1 % (ref 39.0–52.0)
HEMOGLOBIN: 15.6 g/dL (ref 13.0–17.0)
LYMPHS ABS: 1.5 10*3/uL (ref 0.7–4.0)
Lymphocytes Relative: 15 % (ref 12–46)
MCH: 30.6 pg (ref 26.0–34.0)
MCHC: 36.2 g/dL — AB (ref 30.0–36.0)
MCV: 84.5 fL (ref 78.0–100.0)
MONO ABS: 1.3 10*3/uL — AB (ref 0.1–1.0)
MONOS PCT: 13 % — AB (ref 3–12)
NEUTROS PCT: 71 % (ref 43–77)
Neutro Abs: 7.3 10*3/uL (ref 1.7–7.7)
Platelets: 274 10*3/uL (ref 150–400)
RBC: 5.1 MIL/uL (ref 4.22–5.81)
RDW: 12.9 % (ref 11.5–15.5)
WBC: 10.3 10*3/uL (ref 4.0–10.5)

## 2014-01-03 LAB — MAGNESIUM: Magnesium: 1.9 mg/dL (ref 1.5–2.5)

## 2014-01-03 MED ORDER — METHOCARBAMOL 500 MG PO TABS
1000.0000 mg | ORAL_TABLET | Freq: Once | ORAL | Status: AC
  Administered 2014-01-03: 1000 mg via ORAL
  Filled 2014-01-03: qty 2

## 2014-01-03 MED ORDER — SODIUM CHLORIDE 0.9 % IV SOLN
Freq: Once | INTRAVENOUS | Status: AC
  Administered 2014-01-03: 11:00:00 via INTRAVENOUS

## 2014-01-03 MED ORDER — SODIUM CHLORIDE 0.9 % IV SOLN
Freq: Once | INTRAVENOUS | Status: AC
  Administered 2014-01-03: 14:00:00 via INTRAVENOUS

## 2014-01-03 MED ORDER — IBUPROFEN 800 MG PO TABS
800.0000 mg | ORAL_TABLET | Freq: Once | ORAL | Status: AC
  Administered 2014-01-03: 800 mg via ORAL
  Filled 2014-01-03: qty 1

## 2014-01-03 NOTE — ED Notes (Signed)
Warm blanket given per request

## 2014-01-03 NOTE — ED Notes (Signed)
Pt c/o of feeling "bad", difficult to get precise information from patient. C/o of "weakness", "achy legs" last couple of days. Started Chlorthalidone pill 2-3 days ago. Reports BP 150/85 this am.

## 2014-01-03 NOTE — ED Provider Notes (Addendum)
CSN: [id number]     Arrival date & time 01/03/14  1011 History  This chart was scribed for Tanna Furry, MD by Tula Nakayama, ED Scribe. This patient was seen in room APA14/APA14 and the patient's care was started at 10:26 AM.   Chief Complaint  Patient presents with  . Weakness   The history is provided by the patient. No language interpreter was used.   HPI Comments: [name] is a 68 y.o. male with a history of anxiety, HLD, HTN, CP, and palpitations, who presents to the Emergency Department complaining of constant, general weakness and intermittent cramping in both legs that started 3 days ago. Pt states decreased appetite, one episode of palpitations that occurred last night, mild burning with urination, and elevated BP as associated symptoms. Pt changed medication from Nimodepine to 25 mg of Chrlothalidone 3 days PTA. He notes increased urination with change in treatment. Pt denies dizziness, CP, SOB, and vomiting as associated symptoms.  Pt's wife states that he was hospitalized with similar symptoms a few years ago and was diagnosed with hypernatrema. Pt denies increased anxiety today. He does not like taking medications and reports adverse reactions to Valium.  Past Medical History  Diagnosis Date  . Cancer     testicular   . Anxiety   . Hyperlipidemia   . H. pylori infection   . GERD (gastroesophageal reflux disease)   . Personal history of colonic polyps 2 9 2007  . Hypertension   . Palpitations     event monior 04/11/05- SR with occ PAC  . Chest pain     myoview 07/09/12-normal, nl ef; echo 04/12/05-ef>55%, mild aortic sclerosis  . Headache    Past Surgical History  Procedure Laterality Date  . Back surgery  in2007, 2 ruptured dics  2007    2 ruptured discs  . Colonoscopy  12/06/2010    Procedure: COLONOSCOPY;  Surgeon: Rogene Houston, MD;  Location: [facility] SUITE;  Service: Endoscopy;  Laterality: N/A;  . Cataract extraction w/phaco Left 12/08/2012    Procedure:  CATARACT EXTRACTION PHACO AND INTRAOCULAR LENS PLACEMENT (IOC);  Surgeon: Tonny Branch, MD;  Location: [facility] ORS;  Service: Ophthalmology;  Laterality: Left;  CDE:  23.19   History reviewed. No pertinent family history. History  Substance Use Topics  . Smoking status: Former Smoker    Quit date: 10/23/1970  . Smokeless tobacco: Never Used  . Alcohol Use: No    Review of Systems  Constitutional: Positive for appetite change. Negative for fever, chills, diaphoresis and fatigue.  HENT: Negative for mouth sores, sore throat and trouble swallowing.   Eyes: Negative for visual disturbance.  Respiratory: Negative for cough, chest tightness, shortness of breath and wheezing.   Cardiovascular: Positive for palpitations. Negative for chest pain.  Gastrointestinal: Negative for nausea, vomiting, abdominal pain, diarrhea and abdominal distention.  Endocrine: Negative for polydipsia, polyphagia and polyuria.  Genitourinary: Positive for dysuria. Negative for frequency and hematuria.  Musculoskeletal: Negative for gait problem.       Cramping  Skin: Negative for color change, pallor and rash.  Neurological: Positive for weakness. Negative for dizziness, syncope, light-headedness and headaches.  Hematological: Does not bruise/bleed easily.  Psychiatric/Behavioral: Negative for behavioral problems and confusion. The patient is nervous/anxious.       Allergies  Lisinopril; Diazepam; Hyoscyamine; and Lansoprazole  Home Medications   Prior to Admission medications   Medication Sig Start Date End Date Taking? Authorizing Provider  amLODipine (NORVASC) 2.5 MG tablet Take 1 tablet (  2.5 mg total) by mouth daily. 12/16/13  Yes Tommy Medal, RPH-CPP  aspirin 81 MG tablet Take 81 mg by mouth every other day.    Yes Historical Provider, MD  atorvastatin (LIPITOR) 10 MG tablet Take 10 mg by mouth at bedtime.   Yes Historical Provider, MD  chlorthalidone (HYGROTON) 25 MG tablet Take 1 tablet (25 mg total) by  mouth daily. 12/16/13  Yes Kristin Alvstad, RPH-CPP  pantoprazole (PROTONIX) 40 MG tablet Take 40 mg by mouth at bedtime. 05/22/13  Yes Brittainy M Simmons, PA-C   BP 156/85  Pulse 71  Temp(Src) 97.8 F (36.6 C) (Oral)  Resp 15  Ht 5\' 8"  (1.727 m)  Wt 150 lb (68.04 kg)  BMI 22.81 kg/m2  SpO2 100% Physical Exam  Nursing note and vitals reviewed. Constitutional: He is oriented to person, place, and time. He appears well-developed and well-nourished. No distress.  HENT:  Head: Normocephalic.  Eyes: Conjunctivae are normal. Pupils are equal, round, and reactive to light. No scleral icterus.  Neck: Normal range of motion. Neck supple. No thyromegaly present.  Cardiovascular: Normal rate and regular rhythm.  Exam reveals no gallop and no friction rub.   No murmur heard. Pulmonary/Chest: Effort normal and breath sounds normal. No respiratory distress. He has no wheezes. He has no rales.  Abdominal: Soft. Bowel sounds are normal. He exhibits no distension. There is no tenderness. There is no rebound.  Musculoskeletal: Normal range of motion.  Neurological: He is alert and oriented to person, place, and time.  Skin: Skin is warm and dry. No rash noted.  Psychiatric: He has a normal mood and affect. His behavior is normal.  Anxious    ED Course  Procedures (including critical care time) DIAGNOSTIC STUDIES: Oxygen Saturation is 100% on RA, normal by my interpretation.    COORDINATION OF CARE: 10:34 AM Discussed treatment plan with pt at bedside and pt agreed to plan.  Labs Review Labs Reviewed  CBC WITH DIFFERENTIAL - Abnormal; Notable for the following:    MCHC 36.2 (*)    Monocytes Relative 13 (*)    Monocytes Absolute 1.3 (*)    All other components within normal limits  BASIC METABOLIC PANEL - Abnormal; Notable for the following:    Sodium 121 (*)    Potassium 3.6 (*)    Chloride 81 (*)    Glucose, Bld 123 (*)    GFR calc non Af Amer 90 (*)    All other components within  normal limits  BASIC METABOLIC PANEL - Abnormal; Notable for the following:    Sodium 122 (*)    Chloride 86 (*)    Glucose, Bld 117 (*)    All other components within normal limits  MAGNESIUM  URINALYSIS, ROUTINE W REFLEX MICROSCOPIC    Imaging Review No results found.   EKG Interpretation   Date/Time:  Sunday January 03 2014 10:41:56 EDT Ventricular Rate:  81 PR Interval:  178 QRS Duration: 94 QT Interval:  389 QTC Calculation: 451 R Axis:   42 Text Interpretation:  Sinus rhythm Atrial premature complex Baseline  wander in lead(s) V1 V4 V5 Confirmed by Jeneen Rinks  MD, [place] ([id number]) on  01/03/2014 12:32:19 PM      MDM   Final diagnoses:  Hyponatremia  Medication reaction, initial encounter    On recheck after 1 L fluid patient feels improved. He stands at the bedside. He is not orthostatic. He shows no orthostatic vital sign changes. After 1 L recheck sodium is 122. Not  a significant increase. However, when to ensure that his sodium was improving with the saline hydration. I think he is safe for discharge home. Asked him to restrict his free water intake at home. Stop his chlorthalidone. Use his amlodipine 5 mg per day for his hypertension (he has tolerated this dose for several years).  Please call your doctor for recheck this week. Please return to emergency with any worsening symptoms.  I personally performed the services described in this documentation, which was scribed in my presence. The recorded information has been reviewed and is accurate.    Tanna Furry, MD 01/03/14 [place], MD 01/03/14 [phone]

## 2014-01-03 NOTE — Discharge Instructions (Signed)
Stop your chlorthalidone Continue your amlodipine 5 mg per day. Liberalize your salt intake for the next few days. No more than 1 L of fluid per day for the next 2 days. Call your physician to reschedule a blood test this week. Return to the emergency room with any worsening symptoms.  Hyponatremia  Hyponatremia is when the amount of salt (sodium) in your blood is too low. When sodium levels are low, your cells will absorb extra water and swell. The swelling happens throughout the body, but it mostly affects the brain. Severe brain swelling (cerebral edema), seizures, or coma can happen.  CAUSES   Heart, kidney, or liver problems.  Thyroid problems.  Adrenal gland problems.  Severe vomiting and diarrhea.  Certain medicines or illegal drugs.  Dehydration.  Drinking too much water.  Low-sodium diet. SYMPTOMS   Nausea and vomiting.  Confusion.  Lethargy.  Agitation.  Headache.  Twitching or shaking (seizures).  Unconsciousness.  Appetite loss.  Muscle weakness and cramping. DIAGNOSIS  Hyponatremia is identified by a simple blood test. Your caregiver will perform a history and physical exam to try to find the cause and type of hyponatremia. Other tests may be needed to measure the amount of sodium in your blood and urine. TREATMENT  Treatment will depend on the cause.   Fluids may be given through the vein (IV).  Medicines may be used to correct the sodium imbalance. If medicines are causing the problem, they will need to be adjusted.  Water or fluid intake may be restricted to restore proper balance. The speed of correcting the sodium problem is very important. If the problem is corrected too fast, nerve damage (sometimes unchangeable) can happen. HOME CARE INSTRUCTIONS   Only take medicines as directed by your caregiver. Many medicines can make hyponatremia worse. Discuss all your medicines with your caregiver.  Carefully follow any recommended diet,  including any fluid restrictions.  You may be asked to repeat lab tests. Follow these directions.  Avoid alcohol and recreational drugs. SEEK MEDICAL CARE IF:   You develop worsening nausea, fatigue, headache, confusion, or weakness.  Your original hyponatremia symptoms return.  You have problems following the recommended diet. SEEK IMMEDIATE MEDICAL CARE IF:   You have a seizure.  You faint.  You have ongoing diarrhea or vomiting. MAKE SURE YOU:   Understand these instructions.  Will watch your condition.  Will get help right away if you are not doing well or get worse. Document Released: 02/23/2002 Document Revised: 05/28/2011 Document Reviewed: 08/20/2010 [facility] Patient Information 2015 [place], [place]. This information is not intended to replace advice given to you by your health care provider. Make sure you discuss any questions you have with your health care provider.

## 2014-01-03 NOTE — ED Notes (Signed)
CRITICAL VALUE ALERT  Critical value received:  Na  Date of notification:  01/03/2014  Time of notification:  1941  Critical value read back:Yes.    Nurse who received alert:  Iona Coach  MD notified (1st page):  Jeneen Rinks  Time of first page:  1130   Time MD responded:  [id number]

## 2014-01-06 ENCOUNTER — Ambulatory Visit (INDEPENDENT_AMBULATORY_CARE_PROVIDER_SITE_OTHER): Payer: Medicare Other | Admitting: Pharmacist Clinician (PhC)/ Clinical Pharmacy Specialist

## 2014-01-06 ENCOUNTER — Ambulatory Visit: Payer: Medicare Other | Admitting: Pharmacist Clinician (PhC)/ Clinical Pharmacy Specialist

## 2014-01-06 ENCOUNTER — Other Ambulatory Visit: Payer: Self-pay | Admitting: Cardiovascular Disease

## 2014-01-06 VITALS — BP 172/88 | HR 84 | Ht 68.0 in | Wt 147.6 lb

## 2014-01-06 DIAGNOSIS — E871 Hypo-osmolality and hyponatremia: Secondary | ICD-10-CM

## 2014-01-06 DIAGNOSIS — I10 Essential (primary) hypertension: Secondary | ICD-10-CM

## 2014-01-06 LAB — BASIC METABOLIC PANEL
BUN: 8 mg/dL (ref 6–23)
CHLORIDE: 96 meq/L (ref 96–112)
CO2: 30 meq/L (ref 19–32)
Calcium: 9.6 mg/dL (ref 8.4–10.5)
Creat: 0.73 mg/dL (ref 0.50–1.35)
Glucose, Bld: 107 mg/dL — ABNORMAL HIGH (ref 70–99)
POTASSIUM: 4.8 meq/L (ref 3.5–5.3)
SODIUM: 133 meq/L — AB (ref 135–145)

## 2014-01-06 LAB — TSH: TSH: 1.881 u[IU]/mL (ref 0.350–4.500)

## 2014-01-06 NOTE — Patient Instructions (Signed)
Go to lab once weekly for sodium blood levels.  Continue with amlodipine 5mg  each morning  Blood pressure in office was 172/88, but home readings look good.  Continue with home BP checks  Return for visit in 1 month  HOW TO TAKE YOUR BLOOD PRESSURE:   Rest 5 minutes before taking your blood pressure.    Don't smoke or drink caffeinated beverages for at least 30 minutes before.   Take your blood pressure before (not after) you eat.   Sit comfortably with your back supported and both feet on the floor (don't cross your legs).   Elevate your arm to heart level on a table or a desk.   Use the proper sized cuff. It should fit smoothly and snugly around your bare upper arm. There should be enough room to slip a fingertip under the cuff. The bottom edge of the cuff should be 1 inch above the crease of the elbow.   Ideally, take 3 measurements at one sitting and record the average.

## 2014-01-09 ENCOUNTER — Encounter: Payer: Self-pay | Admitting: Pharmacist Clinician (PhC)/ Clinical Pharmacy Specialist

## 2014-01-09 NOTE — Progress Notes (Signed)
01/09/2014 AMED DATTA March 28, 1945 [id number]   HPI:  [name] is a 68 y.o. male patient of Dr [name], with a PMH below who presents today for a repeat hypertension evaluation.  His blood pressure seems to run high at most office visits, but his home readings run closer to normal, in the 170-017 systolic range and mostly 49S diastolic.  He has been plagued by dizziness since 2011, describing it as a feeling that starts at the top of his head and works its way down.   He states this happens more days than not, and has always believed it to be caused by blood pressure medications.  We reviewed his previous medications for blood pressure.  He had taken both metoprolol and bystolic at different times, as well as lisinopril.  The lisinopril caused angioedema.  He still has the same dizzy feeling, regardless of whether he is taking medication or not.  At his last visit with me he wanted to get off the amlodipine (5mg  each am and 2.5mg  each pm) as he believed it was causing some ankle edema as well as contributing to his dizziness.   I asked him to stop the amlodipine and start with chlorthalidone 25mg  once daily.    He has problems with anxiety, and rather than switching medications, he continued the amlodipine for another week, then took nothing for a "wash-out" week. He had only taken 3 doses of the chlorthalidone when his wife took him to the ER because he was pale and very weak.  His sodium level dropped to 121 on just 3 doses of the chlorthalidone.  He was given IV saline and discharged.  At the ER they asked him to restart the amlodipine at 5mg  once daily.    He is a former smoker, having quit 35 years ago, does not drink alcohol or caffeine.  He states he is particularly sensitive to caffeine, in that it causes palpitations. Claustrophobia is also a problem for him, and at past appointments has asked if we might open the door.  He walks approx 1.5 miles per day.  Their diet is fairly healthy,  very low sodium and high in vegetables and chicken/fish, and he recently switched from grits to oatmeal when he saw the sodium content of grits.  He bought a new BP cuff earlier this year, which was accurate within 5 points, and today he comes in with a new Omron arm cuff, as he thought the wrist cuff would not be as good.  He drinks only water, and often 5 or more approx 20oz cups each day.   He was asked by the ER to decrease his water consumption.  Today he states that he has always drunk water in large amounts as he was told it would help prevent kidney stones.     His home blood pressure readings have been consistently in the 496P systolic and 59-16B diastolic  Even on the days he took no medication, the highest pressure he recorded was 143/78.  His home cuff has been checked in the office and was found to be accurate within 10 points.     Current Outpatient Prescriptions  Medication Sig Dispense Refill  . amLODipine (NORVASC) 2.5 MG tablet Take 1 tablet (2.5 mg total) by mouth daily.  30 tablet  3  . aspirin 81 MG tablet Take 81 mg by mouth every other day.       Marland Kitchen atorvastatin (LIPITOR) 10 MG tablet Take 10 mg by  mouth at bedtime.      . pantoprazole (PROTONIX) 40 MG tablet Take 40 mg by mouth at bedtime.       No current facility-administered medications for this visit.    Allergies  Allergen Reactions  . Lisinopril Swelling    H/o of angioedema   . Chlorthalidone Other (See Comments)    Causes severe hyponatremia  . Diazepam     REACTION: hyperactivity  . Hyoscyamine Swelling  . Lansoprazole     REACTION: oral swelling    Past Medical History  Diagnosis Date  . Cancer     testicular   . Anxiety   . Hyperlipidemia   . H. pylori infection   . GERD (gastroesophageal reflux disease)   . Personal history of colonic polyps 2 9 2007  . Hypertension   . Palpitations     event monior 04/11/05- SR with occ PAC  . Chest pain     myoview 07/09/12-normal, nl ef; echo  04/12/05-ef>55%, mild aortic sclerosis  . Headache     Blood pressure 172/88, pulse 84, height 5\' 8"  (1.727 m), weight 147 lb 9.6 oz (66.951 kg).    ASSESSMENT AND PLAN:   Because his home blood pressures look good, only 3 times was it >160 systolic, I will leave him just on the amlodipine 5mg  daily.  His pressure is elevated in the office, but with his anxiety and claustrophobia issues, I suspect that this is a white coat reaction.  We talked about his low sodium diet and excess water consumption.  He was advised to add more sodium into his diet, although this makes him nervous as he has always associated salt with hypertension.  I also asked that he cut his water consumption down to about 3 of the 20oz cups per day.  His wife believes that his longstanding dizziness may be attributed to hyponatremia, and that because the ER paperwork noted that sometimes hyponatremia is caused by thyroid problems, this might be his problem.  I will have him check a TSH today, as well as come to the lab once weekly x 4 weeks for a BMET and see him back in 1 month.   Tommy Medal PharmD CPP [facility]

## 2014-01-12 ENCOUNTER — Encounter: Payer: Self-pay | Admitting: *Deleted

## 2014-01-13 LAB — BASIC METABOLIC PANEL
BUN: 8 mg/dL (ref 6–23)
CHLORIDE: 101 meq/L (ref 96–112)
CO2: 29 meq/L (ref 19–32)
Calcium: 9.4 mg/dL (ref 8.4–10.5)
Creat: 0.77 mg/dL (ref 0.50–1.35)
Glucose, Bld: 103 mg/dL — ABNORMAL HIGH (ref 70–99)
Potassium: 5 mEq/L (ref 3.5–5.3)
SODIUM: 137 meq/L (ref 135–145)

## 2014-01-18 ENCOUNTER — Encounter: Payer: Self-pay | Admitting: *Deleted

## 2014-01-20 ENCOUNTER — Other Ambulatory Visit: Payer: Self-pay | Admitting: *Deleted

## 2014-01-20 ENCOUNTER — Telehealth: Payer: Self-pay | Admitting: Cardiovascular Disease

## 2014-01-20 DIAGNOSIS — E871 Hypo-osmolality and hyponatremia: Secondary | ICD-10-CM

## 2014-01-20 LAB — BASIC METABOLIC PANEL
BUN: 7 mg/dL (ref 6–23)
CHLORIDE: 101 meq/L (ref 96–112)
CO2: 26 mEq/L (ref 19–32)
CREATININE: 0.75 mg/dL (ref 0.50–1.35)
Calcium: 9 mg/dL (ref 8.4–10.5)
GLUCOSE: 100 mg/dL — AB (ref 70–99)
Potassium: 4.3 mEq/L (ref 3.5–5.3)
Sodium: 137 mEq/L (ref 135–145)

## 2014-01-20 NOTE — Telephone Encounter (Signed)
Spoke to [place] Informed her BMP was released

## 2014-01-22 ENCOUNTER — Telehealth: Payer: Self-pay | Admitting: Pharmacist Clinician (PhC)/ Clinical Pharmacy Specialist

## 2014-01-22 NOTE — Telephone Encounter (Signed)
-----   Message from Chauncy Lean, RN sent at 01/21/2014  4:14 PM EST ----- Preliminary reviewed and await provider signature Results routed to [facility]

## 2014-01-22 NOTE — Telephone Encounter (Signed)
LMOM for spouse.  Sodium level holding at 137.  No need for further weekly labs.  Advised to call us if they have concerns

## 2014-01-27 ENCOUNTER — Encounter: Payer: Self-pay | Admitting: *Deleted

## 2014-02-08 ENCOUNTER — Ambulatory Visit (INDEPENDENT_AMBULATORY_CARE_PROVIDER_SITE_OTHER): Payer: Medicare Other | Admitting: Pharmacist Clinician (PhC)/ Clinical Pharmacy Specialist

## 2014-02-08 VITALS — BP 150/74 | Ht 68.0 in | Wt 156.2 lb

## 2014-02-08 DIAGNOSIS — I10 Essential (primary) hypertension: Secondary | ICD-10-CM

## 2014-02-08 NOTE — Patient Instructions (Signed)
Follow up with me if you notice your home BP readings >140/90 more than 1/2 of the time  Your blood pressure today is 150/74  Check your blood pressure at home several times each week and keep record of the readings.  Take your BP meds as follows continue with amlodipine 5mg  daily   Bring all of your meds, your BP cuff and your record of home blood pressures to your next appointment.  Exercise as you're able, try to walk approximately 30 minutes per day.  Keep salt intake to a minimum, especially watch canned and prepared boxed foods.  Eat more fresh fruits and vegetables and fewer canned items.  Avoid eating in fast food restaurants.    HOW TO TAKE YOUR BLOOD PRESSURE: . Rest 5 minutes before taking your blood pressure. .  Don't smoke or drink caffeinated beverages for at least 30 minutes before. . Take your blood pressure before (not after) you eat. . Sit comfortably with your back supported and both feet on the floor (don't cross your legs). . Elevate your arm to heart level on a table or a desk. . Use the proper sized cuff. It should fit smoothly and snugly around your bare upper arm. There should be enough room to slip a fingertip under the cuff. The bottom edge of the cuff should be 1 inch above the crease of the elbow. . Ideally, take 3 measurements at one sitting and record the average.

## 2014-02-09 ENCOUNTER — Encounter: Payer: Self-pay | Admitting: Pharmacist Clinician (PhC)/ Clinical Pharmacy Specialist

## 2014-02-09 NOTE — Progress Notes (Signed)
I agree with this plan -- sounds good.  [place]

## 2014-02-09 NOTE — Progress Notes (Signed)
02/09/2014 [name] 01/27/1946 [id number]   HPI:  [name] is a 68 y.o. male patient of Dr [name], with a PMH below who presents today for a repeat hypertension evaluation.  His blood pressure seems to run high at most office visits, but his home readings run closer to normal, in the 867-544 systolic range and mostly 92E diastolic.  He has been plagued by dizziness since 2011, describing it as a feeling that starts at the top of his head and works its way down.   He states this happens more days than not, and has always believed it to be caused by blood pressure medications.  We reviewed his previous medications for blood pressure.  He had taken both metoprolol and bystolic at different times, as well as lisinopril.  The lisinopril caused angioedema.  He still has the same dizzy feeling, regardless of whether he is taking medication or not.  We tried stopping the amlodipine and using chlorthalidone, but that led to severe hyponatremia, so amlodipine 5mg  was restarted.  His wife believes these episodes are related to hyponatremia.  He felt worse during the time we were trying to get his sodium level back to normal.    Since I saw him last he has added some sodium back into his diet.  He has also limited his water intake to usually 2 of the 20 ounce cups per day.  Has had fewer dizzy episodes recently.   He is a former smoker, having quit 35 years ago, does not drink alcohol or caffeine.  He states he is particularly sensitive to caffeine, in that it causes palpitations. Claustrophobia is also a problem for him, and at past appointments has asked if we might open the door.  He walks approx 1.5 miles per day.  Their diet is fairly healthy, very low sodium and high in vegetables and chicken/fish, and he recently switched from grits to oatmeal when he saw the sodium content of grits.  He bought a new BP cuff earlier this year, which was accurate within 5 points, and today he comes in with a new  Omron arm cuff, as he thought the wrist cuff would not be as good.    His home blood pressure readings are still consistently in the 100-712R systolic and 97-58I diastolic    Current Outpatient Prescriptions  Medication Sig Dispense Refill  . amLODipine (NORVASC) 5 MG tablet Take 5 mg by mouth daily.    Marland Kitchen aspirin 81 MG tablet Take 81 mg by mouth every other day.     Marland Kitchen atorvastatin (LIPITOR) 10 MG tablet Take 10 mg by mouth at bedtime.    . pantoprazole (PROTONIX) 40 MG tablet Take 40 mg by mouth at bedtime.     No current facility-administered medications for this visit.    Allergies  Allergen Reactions  . Lisinopril Swelling    H/o of angioedema   . Chlorthalidone Other (See Comments)    Causes severe hyponatremia  . Diazepam     REACTION: hyperactivity  . Hyoscyamine Swelling  . Lansoprazole     REACTION: oral swelling    Past Medical History  Diagnosis Date  . Cancer     testicular   . Anxiety   . Hyperlipidemia   . H. pylori infection   . GERD (gastroesophageal reflux disease)   . Personal history of colonic polyps 2 9 2007  . Hypertension   . Palpitations     event monior 04/11/05- SR with  occ PAC  . Chest pain     myoview 07/09/12-normal, nl ef; echo 04/12/05-ef>55%, mild aortic sclerosis  . Headache     Blood pressure 150/74, height 5\' 8"  (1.727 m), weight 156 lb 3.2 oz (70.852 kg).    ASSESSMENT AND PLAN:   Home BP readings all look good, only twice in the last month did he go >779 systolic.  He always seems to run higher in the office.  For now I have asked that he continue with his current regimen.  He is due for follow up with Lyda Jester in 2 weeks.  I have advised that he continue to monitor water intake and eat some salt regularly.  I will see him again as needed, asked him to call if he sees home BP readings consistently rise to >396 systolic.   Tommy Medal PharmD CPP [facility]

## 2014-02-15 ENCOUNTER — Other Ambulatory Visit (HOSPITAL_COMMUNITY): Payer: Self-pay | Admitting: Urology

## 2014-02-15 DIAGNOSIS — R361 Hematospermia: Secondary | ICD-10-CM

## 2014-02-16 ENCOUNTER — Other Ambulatory Visit (HOSPITAL_COMMUNITY): Payer: Self-pay | Admitting: Urology

## 2014-02-16 DIAGNOSIS — R361 Hematospermia: Secondary | ICD-10-CM

## 2014-02-17 ENCOUNTER — Ambulatory Visit (HOSPITAL_COMMUNITY)
Admission: RE | Admit: 2014-02-17 | Discharge: 2014-02-17 | Disposition: A | Discharge status: Home / Self Care | Payer: Medicare Other | Source: Ambulatory Visit | Attending: Urology | Admitting: Urology

## 2014-02-17 DIAGNOSIS — R361 Hematospermia: Secondary | ICD-10-CM | POA: Insufficient documentation

## 2014-02-22 ENCOUNTER — Encounter: Payer: Self-pay | Admitting: Cardiology

## 2014-02-22 ENCOUNTER — Ambulatory Visit (INDEPENDENT_AMBULATORY_CARE_PROVIDER_SITE_OTHER): Payer: Medicare Other | Admitting: Cardiology

## 2014-02-22 VITALS — BP 160/90 | HR 92 | Ht 68.0 in | Wt 151.4 lb

## 2014-02-22 DIAGNOSIS — Z79899 Other long term (current) drug therapy: Secondary | ICD-10-CM

## 2014-02-22 DIAGNOSIS — I10 Essential (primary) hypertension: Secondary | ICD-10-CM

## 2014-02-22 LAB — BASIC METABOLIC PANEL WITH GFR
BUN: 9 mg/dL (ref 6–23)
CO2: 28 meq/L (ref 19–32)
Calcium: 9.6 mg/dL (ref 8.4–10.5)
Chloride: 102 meq/L (ref 96–112)
Creat: 0.82 mg/dL (ref 0.50–1.35)
Glucose, Bld: 99 mg/dL (ref 70–99)
Potassium: 5.2 meq/L (ref 3.5–5.3)
Sodium: 136 meq/L (ref 135–145)

## 2014-02-22 NOTE — Progress Notes (Signed)
02/22/2014 [name]   September 23, 1945  [id number]  Primary Physician Glenda Chroman., MD Primary Cardiologist: Dr. [name]  HPI:  The patient is a 68 year old male, followed by Dr. [name], who presents to clinic today for routine 6 month follow-up. His past history is significant for extreme anxiety, treated hypertension and hyperlipidemia. He has also had atypical chest pain in the past with a negative Myoview in April 2014. It was felt that his chest pain was likely secondary to acid reflux. He was placed on PPI therapy and noted significant improvement in symptoms. He had a 2-D echocardiogram performed 11/28/2013 revealing normal systolic function with ejection fraction of 55-60%. There were no regional wall motion abnormalities and no valvular abnormalities.   He has been seen multiple times by our office pharmacist for management of hypertension. [place] office visits, he has been noted to have hypertension with systolic pressures in the 150s-160s, however his home readings have been lower in the 130s. Several medication adjustments were made. At one point, he was placed on chlorthalidone for his blood pressure however this resulted in significant hyponatremia with sodium levels as low as 121. During this period of time, he noted feeling "fuzzy headed" and also had some mild confusion. Subsequently, chlorthalidone was discontinued and he was placed on low-dose amlodipine. His hyponatremia resolved and he has been doing well with amlodipine. He checks his blood pressure regularly at home and systolic blood pressures have ranged in the 120s to 130s. 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 He continues to take Protonix. He walks daily for exercise. No limitations. Only complaint is feeling "fuzzy headed"/ sleepy at times, but denies syncope/ near syncope.  This is the same feeling he had when he was hyponatremic. He is concerned that his sodium levels may be low again.  EKG today demonstrates normal sinus rhythm without any ischemic abnormalities. His blood pressure is slightly elevated at 160/90.      Current Outpatient Prescriptions  Medication Sig Dispense Refill  . amLODipine (NORVASC) 5 MG tablet Take 5 mg by mouth daily.    Marland Kitchen aspirin 81 MG tablet Take 81 mg by mouth every other day.     Marland Kitchen atorvastatin (LIPITOR) 10 MG tablet Take 10 mg by mouth at bedtime.    . pantoprazole (PROTONIX) 40 MG tablet Take 40 mg by mouth as needed.      No current facility-administered medications for this visit.    Allergies  Allergen Reactions  . Lisinopril Swelling    H/o of angioedema   . Chlorthalidone Other (See Comments)    Causes severe hyponatremia  . Diazepam     REACTION: hyperactivity  . Hyoscyamine Swelling  . Lansoprazole     REACTION: oral swelling    History   Social History  . Marital Status: Married    Spouse Name: N/A    Number of Children: N/A  . Years of Education: N/A   Occupational History  . Not on file.   Social History Main Topics  . Smoking status: Former Smoker    Quit date: 10/23/1970  . Smokeless tobacco: Never Used  . Alcohol Use: No  . Drug Use: No  . Sexual Activity: Not on file   Other Topics Concern  . Not on file   Social History Narrative     Review of Systems: General: negative  for chills, fever, night sweats or weight changes.  Cardiovascular: negative for chest pain, dyspnea on exertion, edema, orthopnea, palpitations, paroxysmal nocturnal dyspnea or shortness of breath Dermatological: negative for rash Respiratory: negative for cough or wheezing Urologic: negative for hematuria Abdominal: negative for nausea, vomiting, diarrhea, bright red blood per rectum, melena, or hematemesis Neurologic: negative for visual changes, syncope, or dizziness All other systems reviewed and are  otherwise negative except as noted above.    Blood pressure 160/90, pulse 92, height 5\' 8"  (1.727 m), weight 151 lb 6.4 oz (68.675 kg).  General appearance: alert, cooperative and no distress Neck: no carotid bruit and no JVD Lungs: clear to auscultation bilaterally Heart: regular rate and rhythm, S1, S2 normal, no murmur, click, rub or gallop Extremities: no LEE Pulses: 2+ and symmetric Skin: warm and dry Neurologic: Grossly normal  EKG sinus rhythm. Heart rate 92 bpm. No ischemic changes.  ASSESSMENT AND PLAN:   1. Hypertension: Systolic blood pressure today is elevated at 160 however the patient has a history of whitecoat syndrome and is also an extremely anxious person, particularly in regards to his health. He continues to monitor his Blood pressures at home on a regular basis and systolic readings have been well controlled in the 120s to 130s. We will continue him on his current medical regimen of 5 mg of amlodipine daily.  2. "Fuzzy headed feeling": He denies syncope/near-syncope and no confusion however he states that this is the same sensation he felt when he was found to be hyponatremic. He has since been off of chlorthalidone and I'm doubtful that he has had recurrent hyponatremia, however the patient is very concerned and is very anxious. For reassurance, we'll check at [facility] to ensure that sodium and other electrolytes are stable.  3. GERD: Continue PPI therapy.  4. Dyslipidemia: On Lipitor therapy. Lipid panel is followed by his PCP.  PLAN  The patient  appears stable from a cardiovascular standpoint. As mentioned above, we will check a BMP to assess electrolytes. We will notify the patient of any abnormal results. He has been instructed to follow-up with Dr. [name] in 6 months for routine assessment.  Travus Oren, BRITTAINYPA-C 02/22/2014 10:00 AM

## 2014-02-22 NOTE — Patient Instructions (Signed)
Your physician wants you to follow-up in: 6 Months You will receive a reminder letter in the mail two months in advance. If you don't receive a letter, please call our office to schedule the follow-up appointment.  Your physician recommends that you return for lab work BMP

## 2014-03-10 ENCOUNTER — Telehealth: Payer: Self-pay | Admitting: *Deleted

## 2014-03-10 NOTE — Telephone Encounter (Signed)
SPOKE TO WIFE RESULTS GIVEN CONCERNING BMP.VERBALIZED UNDERSTANDING.

## 2014-03-10 NOTE — Telephone Encounter (Signed)
-----   Message from Consuelo Pandy, [place] sent at 03/09/2014  5:27 PM EST ----- Normal BMP. Sodium is normal. No change in meds.

## 2014-04-06 ENCOUNTER — Ambulatory Visit: Payer: Self-pay | Admitting: Urology

## 2014-04-06 ENCOUNTER — Ambulatory Visit (INDEPENDENT_AMBULATORY_CARE_PROVIDER_SITE_OTHER): Payer: Medicare Other | Admitting: Urology

## 2014-04-06 DIAGNOSIS — R361 Hematospermia: Secondary | ICD-10-CM

## 2014-04-12 NOTE — Patient Instructions (Signed)
KENO CARAWAY  04/12/2014   Your procedure is scheduled on:  04/15/2014  Report to [name] at 7:30 AM.  Call this number if you have problems the morning of surgery: [phone]   Remember:   Do not eat food or drink liquids after midnight.   Take these medicines the morning of surgery with A SIP OF WATER: Amlodipine, Protonix   Do not wear jewelry, make-up or nail polish.  Do not wear lotions, powders, or perfumes. You may wear deodorant.  Do not shave 48 hours prior to surgery. Men may shave face and neck.  Do not bring valuables to the hospital.  [facility] is not responsible for any belongings or valuables.               Contacts, dentures or bridgework may not be worn into surgery.  Leave suitcase in the car. After surgery it may be brought to your room.  For patients admitted to the hospital, discharge time is determined by your treatment team.               Patients discharged the day of surgery will not be allowed to drive home.  Name and phone number of your driver:   Special Instructions: N/A   Please read over the following fact sheets that you were given: Anesthesia Post-op Instructions   PATIENT INSTRUCTIONS POST-ANESTHESIA  IMMEDIATELY FOLLOWING SURGERY:  Do not drive or operate machinery for the first twenty four hours after surgery.  Do not make any important decisions for twenty four hours after surgery or while taking narcotic pain medications or sedatives.  If you develop intractable nausea and vomiting or a severe headache please notify your doctor immediately.  FOLLOW-UP:  Please make an appointment with your surgeon as instructed. You do not need to follow up with anesthesia unless specifically instructed to do so.  WOUND CARE INSTRUCTIONS (if applicable):  Keep a dry clean dressing on the anesthesia/puncture wound site if there is drainage.  Once the wound has quit draining you may leave it open to air.  Generally you should leave the bandage intact for  twenty four hours unless there is drainage.  If the epidural site drains for more than 36-48 hours please call the anesthesia department.  QUESTIONS?:  Please feel free to call your physician or the hospital operator if you have any questions, and they will be happy to assist you.      Cataract Surgery  A cataract is a clouding of the lens of the eye. When a lens becomes cloudy, vision is reduced based on the degree and nature of the clouding. Surgery may be needed to improve vision. Surgery removes the cloudy lens and usually replaces it with a substitute lens (intraocular lens, IOL). LET YOUR EYE DOCTOR KNOW ABOUT:  Allergies to food or medicine.  Medicines taken including herbs, eye drops, over-the-counter medicines, and creams.  Use of steroids (by mouth or creams).  Previous problems with anesthetics or numbing medicine.  History of bleeding problems or blood clots.  Previous surgery.  Other health problems, including diabetes and kidney problems.  Possibility of pregnancy, if this applies. RISKS AND COMPLICATIONS  Infection.  Inflammation of the eyeball (endophthalmitis) that can spread to both eyes (sympathetic ophthalmia).  Poor wound healing.  If an IOL is inserted, it can later fall out of proper position. This is very uncommon.  Clouding of the part of your eye that holds an IOL in place. This is called an "after-cataract."  These are uncommon but easily treated. BEFORE THE PROCEDURE  Do not eat or drink anything except small amounts of water for 8 to 12 before your surgery, or as directed by your caregiver.  Unless you are told otherwise, continue any eye drops you have been prescribed.  Talk to your primary caregiver about all other medicines that you take (both prescription and nonprescription). In some cases, you may need to stop or change medicines near the time of your surgery. This is most important if you are taking blood-thinning medicine.Do not stop  medicines unless you are told to do so.  Arrange for someone to drive you to and from the procedure.  Do not put contact lenses in either eye on the day of your surgery. PROCEDURE There is more than one method for safely removing a cataract. Your doctor can explain the differences and help determine which is best for you. Phacoemulsification surgery is the most common form of cataract surgery.  An injection is given behind the eye or eye drops are given to make this a painless procedure.  A small cut (incision) is made on the edge of the clear, dome-shaped surface that covers the front of the eye (cornea).  A tiny probe is painlessly inserted into the eye. This device gives off ultrasound waves that soften and break up the cloudy center of the lens. This makes it easier for the cloudy lens to be removed by suction.  An IOL may be implanted.  The normal lens of the eye is covered by a clear capsule. Part of that capsule is intentionally left in the eye to support the IOL.  Your surgeon may or may not use stitches to close the incision. There are other forms of cataract surgery that require a larger incision and stitches to close the eye. This approach is taken in cases where the doctor feels that the cataract cannot be easily removed using phacoemulsification. AFTER THE PROCEDURE  When an IOL is implanted, it does not need care. It becomes a permanent part of your eye and cannot be seen or felt.  Your doctor will schedule follow-up exams to check on your progress.  Review your other medicines with your doctor to see which can be resumed after surgery.  Use eye drops or take medicine as prescribed by your doctor. Document Released: 02/22/2011 Document Revised: 07/20/2013 Document Reviewed: 02/22/2011 [facility] Patient Information 2015 [place], [place]. This information is not intended to replace advice given to you by your health care provider. Make sure you discuss any questions you have  with your health care provider.

## 2014-04-13 ENCOUNTER — Encounter (HOSPITAL_COMMUNITY)
Admission: RE | Admit: 2014-04-13 | Discharge: 2014-04-13 | Disposition: A | Discharge status: Home / Self Care | Payer: Medicare Other | Source: Ambulatory Visit | Attending: Ophthalmology | Admitting: Ophthalmology

## 2014-04-13 ENCOUNTER — Encounter (HOSPITAL_COMMUNITY): Payer: Self-pay

## 2014-04-13 DIAGNOSIS — H25811 Combined forms of age-related cataract, right eye: Secondary | ICD-10-CM | POA: Diagnosis not present

## 2014-04-13 LAB — BASIC METABOLIC PANEL
ANION GAP: 4 — AB (ref 5–15)
BUN: 11 mg/dL (ref 6–23)
CHLORIDE: 107 mmol/L (ref 96–112)
CO2: 29 mmol/L (ref 19–32)
CREATININE: 0.81 mg/dL (ref 0.50–1.35)
Calcium: 9.7 mg/dL (ref 8.4–10.5)
GFR calc Af Amer: >90 mL/min (ref >90)
GFR, EST NON AFRICAN AMERICAN: 90 mL/min — AB (ref >90)
Glucose, Bld: 118 mg/dL — ABNORMAL HIGH (ref 70–99)
POTASSIUM: 4.8 mmol/L (ref 3.5–5.1)
Sodium: 140 mmol/L (ref 135–145)

## 2014-04-13 LAB — CBC
HCT: 41 % (ref 39.0–52.0)
HEMOGLOBIN: 13.2 g/dL (ref 13.0–17.0)
MCH: 28.9 pg (ref 26.0–34.0)
MCHC: 32.2 g/dL (ref 30.0–36.0)
MCV: 89.7 fL (ref 78.0–100.0)
Platelets: 268 10*3/uL (ref 150–400)
RBC: 4.57 MIL/uL (ref 4.22–5.81)
RDW: 12.4 % (ref 11.5–15.5)
WBC: 7 10*3/uL (ref 4.0–10.5)

## 2014-04-14 MED ORDER — NEOMYCIN-POLYMYXIN-DEXAMETH 3.5-10000-0.1 OP SUSP
OPHTHALMIC | Status: AC
  Filled 2014-04-14: qty 5

## 2014-04-14 MED ORDER — LIDOCAINE HCL (PF) 1 % IJ SOLN
INTRAMUSCULAR | Status: AC
  Filled 2014-04-14: qty 2

## 2014-04-14 MED ORDER — LIDOCAINE HCL 3.5 % OP GEL
OPHTHALMIC | Status: AC
  Filled 2014-04-14: qty 1

## 2014-04-14 MED ORDER — CYCLOPENTOLATE-PHENYLEPHRINE OP SOLN OPTIME - NO CHARGE
OPHTHALMIC | Status: AC
  Filled 2014-04-14: qty 2

## 2014-04-14 MED ORDER — PHENYLEPHRINE HCL 2.5 % OP SOLN
OPHTHALMIC | Status: AC
  Filled 2014-04-14: qty 15

## 2014-04-14 MED ORDER — TETRACAINE HCL 0.5 % OP SOLN
OPHTHALMIC | Status: AC
  Filled 2014-04-14: qty 2

## 2014-04-15 ENCOUNTER — Ambulatory Visit (HOSPITAL_COMMUNITY): Payer: Medicare Other | Admitting: Anesthesiology

## 2014-04-15 ENCOUNTER — Ambulatory Visit (HOSPITAL_COMMUNITY)
Admission: RE | Admit: 2014-04-15 | Discharge: 2014-04-15 | Disposition: A | Discharge status: Home / Self Care | Payer: Medicare Other | Source: Ambulatory Visit | Attending: Ophthalmology | Admitting: Ophthalmology

## 2014-04-15 ENCOUNTER — Encounter (HOSPITAL_COMMUNITY): Payer: Self-pay | Admitting: *Deleted

## 2014-04-15 ENCOUNTER — Encounter (HOSPITAL_COMMUNITY)
Admission: RE | Disposition: A | Discharge status: Home / Self Care | Payer: Self-pay | Source: Ambulatory Visit | Attending: Ophthalmology

## 2014-04-15 DIAGNOSIS — H25811 Combined forms of age-related cataract, right eye: Secondary | ICD-10-CM | POA: Diagnosis not present

## 2014-04-15 HISTORY — PX: CATARACT EXTRACTION W/PHACO: SHX586

## 2014-04-15 SURGERY — PHACOEMULSIFICATION, CATARACT, WITH IOL INSERTION
Anesthesia: Monitor Anesthesia Care | Site: Eye | Laterality: Right

## 2014-04-15 MED ORDER — TETRACAINE HCL 0.5 % OP SOLN
1.0000 [drp] | OPHTHALMIC | Status: AC
  Administered 2014-04-15 (×3): 1 [drp] via OPHTHALMIC

## 2014-04-15 MED ORDER — LACTATED RINGERS IV SOLN
INTRAVENOUS | Status: DC
  Administered 2014-04-15: 09:00:00 via INTRAVENOUS

## 2014-04-15 MED ORDER — LIDOCAINE 3.5 % OP GEL OPTIME - NO CHARGE
OPHTHALMIC | Status: DC | PRN
  Administered 2014-04-15: 1 [drp] via OPHTHALMIC

## 2014-04-15 MED ORDER — CYCLOPENTOLATE-PHENYLEPHRINE 0.2-1 % OP SOLN
1.0000 [drp] | OPHTHALMIC | Status: AC
  Administered 2014-04-15 (×3): 1 [drp] via OPHTHALMIC

## 2014-04-15 MED ORDER — EPINEPHRINE HCL 1 MG/ML IJ SOLN
INTRAMUSCULAR | Status: AC
  Filled 2014-04-15: qty 1

## 2014-04-15 MED ORDER — EPINEPHRINE HCL 1 MG/ML IJ SOLN
INTRAOCULAR | Status: DC | PRN
  Administered 2014-04-15: 09:00:00

## 2014-04-15 MED ORDER — POVIDONE-IODINE 5 % OP SOLN
OPHTHALMIC | Status: DC | PRN
  Administered 2014-04-15: 1 via OPHTHALMIC

## 2014-04-15 MED ORDER — PROVISC 10 MG/ML IO SOLN
INTRAOCULAR | Status: DC | PRN
  Administered 2014-04-15: 0.85 mL via INTRAOCULAR

## 2014-04-15 MED ORDER — NEOMYCIN-POLYMYXIN-DEXAMETH 3.5-10000-0.1 OP SUSP
OPHTHALMIC | Status: DC | PRN
  Administered 2014-04-15: 1 [drp] via OPHTHALMIC

## 2014-04-15 MED ORDER — BSS IO SOLN
INTRAOCULAR | Status: DC | PRN
  Administered 2014-04-15: 15 mL via INTRAOCULAR

## 2014-04-15 MED ORDER — FENTANYL CITRATE 0.05 MG/ML IJ SOLN
INTRAMUSCULAR | Status: AC
  Filled 2014-04-15: qty 2

## 2014-04-15 MED ORDER — PHENYLEPHRINE HCL 2.5 % OP SOLN
1.0000 [drp] | OPHTHALMIC | Status: DC | PRN
  Administered 2014-04-15: 1 [drp] via OPHTHALMIC

## 2014-04-15 MED ORDER — MIDAZOLAM HCL 2 MG/2ML IJ SOLN
INTRAMUSCULAR | Status: AC
  Filled 2014-04-15: qty 2

## 2014-04-15 MED ORDER — PHENYLEPHRINE HCL 2.5 % OP SOLN
1.0000 [drp] | OPHTHALMIC | Status: AC
  Administered 2014-04-15 (×2): 1 [drp] via OPHTHALMIC

## 2014-04-15 MED ORDER — LIDOCAINE HCL 3.5 % OP GEL
1.0000 "application " | Freq: Once | OPHTHALMIC | Status: AC
  Administered 2014-04-15: 1 via OPHTHALMIC

## 2014-04-15 MED ORDER — MIDAZOLAM HCL 2 MG/2ML IJ SOLN
1.0000 mg | INTRAMUSCULAR | Status: DC | PRN
  Administered 2014-04-15: 2 mg via INTRAVENOUS

## 2014-04-15 MED ORDER — LIDOCAINE HCL (PF) 1 % IJ SOLN
INTRAMUSCULAR | Status: DC | PRN
  Administered 2014-04-15: .7 mL

## 2014-04-15 MED ORDER — FENTANYL CITRATE 0.05 MG/ML IJ SOLN
25.0000 ug | INTRAMUSCULAR | Status: AC
  Administered 2014-04-15 (×2): 25 ug via INTRAVENOUS

## 2014-04-15 SURGICAL SUPPLY — 11 items
CLOTH BEACON ORANGE TIMEOUT ST (SAFETY) ×2 IMPLANT
EYE SHIELD UNIVERSAL CLEAR (GAUZE/BANDAGES/DRESSINGS) ×2 IMPLANT
GLOVE BIOGEL PI IND STRL 7.0 (GLOVE) ×1 IMPLANT
GLOVE BIOGEL PI INDICATOR 7.0 (GLOVE) ×1
GLOVE EXAM NITRILE LRG STRL (GLOVE) ×2 IMPLANT
PAD ARMBOARD 7.5X6 YLW CONV (MISCELLANEOUS) ×2 IMPLANT
SIGHTPATH CAT PROC W REG LENS (Ophthalmic Related) ×2 IMPLANT
SYRINGE LUER LOK 1CC (MISCELLANEOUS) ×2 IMPLANT
TAPE SURG TRANSPORE 1 IN (GAUZE/BANDAGES/DRESSINGS) ×1 IMPLANT
TAPE SURGICAL TRANSPORE 1 IN (GAUZE/BANDAGES/DRESSINGS) ×1
WATER STERILE IRR 250ML POUR (IV SOLUTION) ×2 IMPLANT

## 2014-04-15 NOTE — Discharge Instructions (Signed)

## 2014-04-15 NOTE — Op Note (Signed)
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  Assistants: None  Anesthesia: Topical with MAC  Indications: Painless, progressive loss of vision with compromise of daily activities.  Surgery: Cataract Extraction with Intraocular lens Implant Right Eye  Discription: The patient had dilating drops and viscous lidocaine placed into the Right eye in the pre-op holding area. After transfer to the operating room, a time out was performed. The patient was then prepped and draped. Beginning with a 71 degree blade a paracentesis port was made at the surgeon's 2 o'clock position. The anterior chamber was then filled with 1% non-preserved lidocaine. This was followed by filling the anterior chamber with Provisc.  A 2.80mm keratome blade was used to make a clear corneal incision at the temporal limbus.  A bent cystatome needle was used to create a continuous tear capsulotomy. Hydrodissection was performed with balanced salt solution on a Fine canula. The lens nucleus was then removed using the phacoemulsification handpiece. Residual cortex was removed with the I&A handpiece. The anterior chamber and capsular bag were refilled with Provisc. A posterior chamber intraocular lens was placed into the capsular bag with it's injector. The implant was positioned with the Kuglan hook. The Provisc was then removed from the anterior chamber and capsular bag with the I&A handpiece. Stromal hydration of the main incision and paracentesis port was performed with BSS on a Fine canula. The wounds were tested for leak which was negative. The patient tolerated the procedure well. There were no operative complications. The patient was then transferred to the recovery room in stable condition.  Complications: None  Specimen: None  EBL: None  Prosthetic device: Hoya iSert 250, power 22.5  D, SN [id number].

## 2014-04-15 NOTE — Anesthesia Postprocedure Evaluation (Signed)
  Anesthesia Post-op Note  Patient: [name]  Procedure(s) Performed: Procedure(s) with comments: CATARACT EXTRACTION PHACO AND INTRAOCULAR LENS PLACEMENT RIGHT (Right) - CDE:10.32  Patient Location: Short Stay  Anesthesia Type:MAC  Level of Consciousness: awake, alert , oriented and patient cooperative  Airway and Oxygen Therapy: Patient Spontanous Breathing  Post-op Pain: none  Post-op Assessment: Post-op Vital signs reviewed, Patient's Cardiovascular Status Stable, Respiratory Function Stable, Patent Airway and No signs of Nausea or vomiting  Post-op Vital Signs: Reviewed and stable  Last Vitals:  Filed Vitals:   04/15/14 0840  BP: 175/89  Temp:   Resp: 27    Complications: No apparent anesthesia complications

## 2014-04-15 NOTE — Anesthesia Preprocedure Evaluation (Signed)
Anesthesia Evaluation  Patient identified by MRN, date of birth, ID band Patient awake    Reviewed: Allergy & Precautions, H&P , NPO status , Patient's Chart, lab work & pertinent test results  Airway Mallampati: I  TM Distance: >3 FB     Dental  (+) Edentulous Upper, Edentulous Lower   Pulmonary former smoker,  breath sounds clear to auscultation        Cardiovascular hypertension, Pt. on medications + dysrhythmias (palpitations) Rhythm:Regular Rate:Normal     Neuro/Psych  Headaches, PSYCHIATRIC DISORDERS Anxiety    GI/Hepatic GERD-  Medicated and Controlled,  Endo/Other    Renal/GU      Musculoskeletal   Abdominal   Peds  Hematology   Anesthesia Other Findings   Reproductive/Obstetrics                             Anesthesia Physical Anesthesia Plan  ASA: III  Anesthesia Plan: MAC   Post-op Pain Management:    Induction: Intravenous  Airway Management Planned: Nasal Cannula  Additional Equipment:   Intra-op Plan:   Post-operative Plan:   Informed Consent: I have reviewed the patients History and Physical, chart, labs and discussed the procedure including the risks, benefits and alternatives for the proposed anesthesia with the patient or authorized representative who has indicated his/her understanding and acceptance.     Plan Discussed with:   Anesthesia Plan Comments:         Anesthesia Quick Evaluation

## 2014-04-15 NOTE — H&P (Signed)
I have reviewed the H&P, the patient was re-examined, and I have identified no interval changes in medical condition and plan of care since the history and physical of record  

## 2014-04-15 NOTE — Transfer of Care (Signed)
Immediate Anesthesia Transfer of Care Note  Patient: [name]  Procedure(s) Performed: Procedure(s) with comments: CATARACT EXTRACTION PHACO AND INTRAOCULAR LENS PLACEMENT RIGHT (Right) - CDE:10.32  Patient Location:Short stay  Anesthesia Type:MAC  Level of Consciousness: awake, alert , oriented and patient cooperative  Airway & Oxygen Therapy: Patient Spontanous Breathing  Post-op Assessment: Report given to PACU RN and Post -op Vital signs reviewed and stable  Post vital signs: Reviewed and stable  Last Vitals:  Filed Vitals:   04/15/14 0840  BP: 175/89  Temp:   Resp: 27    Complications: No apparent anesthesia complications

## 2014-04-16 ENCOUNTER — Encounter (HOSPITAL_COMMUNITY): Payer: Self-pay | Admitting: Ophthalmology

## 2014-05-18 ENCOUNTER — Ambulatory Visit (INDEPENDENT_AMBULATORY_CARE_PROVIDER_SITE_OTHER): Payer: Medicare Other | Admitting: Internal Medicine

## 2014-06-11 ENCOUNTER — Telehealth: Payer: Self-pay | Admitting: Cardiovascular Disease

## 2014-06-11 DIAGNOSIS — E785 Hyperlipidemia, unspecified: Secondary | ICD-10-CM

## 2014-06-11 DIAGNOSIS — Z79899 Other long term (current) drug therapy: Secondary | ICD-10-CM

## 2014-06-11 NOTE — Telephone Encounter (Signed)
Patient notified

## 2014-06-11 NOTE — Telephone Encounter (Signed)
Lab slip mailed to patient.

## 2014-06-11 NOTE — Telephone Encounter (Signed)
Pt's wife called in stating that he would like to have his cholesterol checked before coming in to see Dr. [name].Please call pt once labs have been placed.  Thanks

## 2014-06-17 ENCOUNTER — Other Ambulatory Visit: Payer: Self-pay | Admitting: Cardiovascular Disease

## 2014-06-17 ENCOUNTER — Telehealth: Payer: Self-pay | Admitting: Cardiovascular Disease

## 2014-06-17 NOTE — Telephone Encounter (Signed)
Faxed lipid and hepatic  Panel order sheet

## 2014-06-17 NOTE — Telephone Encounter (Signed)
[name] called in stating that the pt is currently at the lab and she asked that his lab orders be faxed to the office. The fax number is [phone]  Thanks

## 2014-06-18 LAB — LIPID PANEL W/O CHOL/HDL RATIO
CHOLESTEROL TOTAL: 146 mg/dL (ref 100–199)
HDL: 47 mg/dL (ref >39)
LDL Calculated: 66 mg/dL (ref 0–99)
Triglycerides: 163 mg/dL — ABNORMAL HIGH (ref 0–149)
VLDL Cholesterol Cal: 33 mg/dL (ref 5–40)

## 2014-06-18 LAB — HEPATIC FUNCTION PANEL
ALK PHOS: 107 IU/L (ref 39–117)
ALT: 21 IU/L (ref 0–44)
AST: 19 IU/L (ref 0–40)
Albumin: 4 g/dL (ref 3.6–4.8)
BILIRUBIN, DIRECT: 0.09 mg/dL (ref 0.00–0.40)
Bilirubin Total: 0.4 mg/dL (ref 0.0–1.2)
Total Protein: 7 g/dL (ref 6.0–8.5)

## 2014-06-24 ENCOUNTER — Encounter: Payer: Self-pay | Admitting: *Deleted

## 2014-07-01 ENCOUNTER — Other Ambulatory Visit: Payer: Self-pay | Admitting: Pharmacist Clinician (PhC)/ Clinical Pharmacy Specialist

## 2014-07-01 MED ORDER — AMLODIPINE BESYLATE 5 MG PO TABS: 5.0000 mg | ORAL_TABLET | Freq: Every day | ORAL | Status: DC

## 2014-08-12 ENCOUNTER — Ambulatory Visit (INDEPENDENT_AMBULATORY_CARE_PROVIDER_SITE_OTHER): Payer: Medicare Other | Admitting: Cardiovascular Disease

## 2014-08-12 ENCOUNTER — Encounter: Payer: Self-pay | Admitting: Cardiovascular Disease

## 2014-08-12 VITALS — BP 164/76 | HR 83 | Ht 68.0 in | Wt 155.0 lb

## 2014-08-12 DIAGNOSIS — E785 Hyperlipidemia, unspecified: Secondary | ICD-10-CM

## 2014-08-12 DIAGNOSIS — I10 Essential (primary) hypertension: Secondary | ICD-10-CM | POA: Diagnosis not present

## 2014-08-12 NOTE — Patient Instructions (Signed)
Your physician wants you to follow-up in: 1 year with Dr Berry. You will receive a reminder letter in the mail two months in advance. If you don't receive a letter, please call our office to schedule the follow-up appointment.  

## 2014-08-12 NOTE — Progress Notes (Signed)
08/12/2014 DONTAVIAN MARCHI   06-03-1945  [id number]  Primary Physician Glenda Chroman., MD Primary Cardiologist: Lorretta Harp MD Renae Gloss   HPI: [name] is a 69 year old well-appearing Caucasian male who I saw one year ago. He has a history of hypertension and hyperlipidemia. He had atypical chest pain here tone with a negative Myoview/23/14. His pain improved with proton pump inhibition. Because of recurrent chest pain he saw [name], PA-C in the office who did obtain a 2-D echo and Myoview perfusion study all normal. His pain has improved again on a PPI suggesting that it was related to GERD. Since that time a year ago he's remained clinically stable with occasional atypical shortness of breath.   Current Outpatient Prescriptions  Medication Sig Dispense Refill  . amLODipine (NORVASC) 5 MG tablet Take 1 tablet (5 mg total) by mouth daily. 90 tablet 2  . aspirin 81 MG tablet Take 81 mg by mouth daily.     Marland Kitchen atorvastatin (LIPITOR) 10 MG tablet Take 10 mg by mouth at bedtime.    . pantoprazole (PROTONIX) 40 MG tablet Take 40 mg by mouth daily as needed (acid reflux).      No current facility-administered medications for this visit.    Allergies  Allergen Reactions  . Lisinopril Swelling    H/o of angioedema   . Chlorthalidone Other (See Comments)    Causes severe hyponatremia  . Diazepam     REACTION: hyperactivity  . Hyoscyamine Swelling  . Lansoprazole     REACTION: oral swelling    History   Social History  . Marital Status: Married    Spouse Name: N/A  . Number of Children: N/A  . Years of Education: N/A   Occupational History  . Not on file.   Social History Main Topics  . Smoking status: Former Smoker -- 1.00 packs/day for 15 years    Quit date: 10/23/1970  . Smokeless tobacco: Never Used  . Alcohol Use: No  . Drug Use: No  . Sexual Activity: Not on file   Other Topics Concern  . Not on file   Social History Narrative      Review of Systems: General: negative for chills, fever, night sweats or weight changes.  Cardiovascular: negative for chest pain, dyspnea on exertion, edema, orthopnea, palpitations, paroxysmal nocturnal dyspnea or shortness of breath Dermatological: negative for rash Respiratory: negative for cough or wheezing Urologic: negative for hematuria Abdominal: negative for nausea, vomiting, diarrhea, bright red blood per rectum, melena, or hematemesis Neurologic: negative for visual changes, syncope, or dizziness All other systems reviewed and are otherwise negative except as noted above.    Blood pressure 164/76, pulse 83, height 5\' 8"  (1.727 m), weight 155 lb (70.308 kg).  General appearance: alert and no distress Neck: no adenopathy, no carotid bruit, no JVD, supple, symmetrical, trachea midline and thyroid not enlarged, symmetric, no tenderness/mass/nodules Lungs: clear to auscultation bilaterally Heart: regular rate and rhythm, S1, S2 normal, no murmur, click, rub or gallop Extremities: extremities normal, atraumatic, no cyanosis or edema and 2+ pedal pulses  EKG normal sinus rhythm at 83 with nonspecific ST and T-wave changes. I personally reviewed this EKG  ASSESSMENT AND PLAN:   Hyperlipidemia History of hyperlipidemia on atorvastatin 10 mg a day with recent lipid profile performed 06/17/14 revealing LDL 66 and HDL of 47   HTN (hypertension) History of hypertension blood pressure measured at 164/76. He is on amlodipine. Blood pressure at home measures 133/75. I believe  he has "white coat hypertension. Continue current medications       Lorretta Harp MD [facility], [facility] 08/12/2014 3:16 PM

## 2014-08-12 NOTE — Assessment & Plan Note (Signed)
History of hyperlipidemia on atorvastatin 10 mg a day with recent lipid profile performed 06/17/14 revealing LDL 66 and HDL of 47

## 2014-08-12 NOTE — Assessment & Plan Note (Signed)
History of hypertension blood pressure measured at 164/76. He is on amlodipine. Blood pressure at home measures 133/75. I believe he has "white coat hypertension. Continue current medications

## 2014-08-25 ENCOUNTER — Ambulatory Visit: Payer: Medicare Other | Admitting: Cardiovascular Disease

## 2014-09-03 ENCOUNTER — Emergency Department (HOSPITAL_COMMUNITY): Payer: Medicare Other

## 2014-09-03 ENCOUNTER — Emergency Department (HOSPITAL_COMMUNITY)
Admission: EM | Admit: 2014-09-03 | Discharge: 2014-09-03 | Disposition: A | Discharge status: Home / Self Care | Payer: Medicare Other | Attending: Emergency Medicine | Admitting: Emergency Medicine

## 2014-09-03 ENCOUNTER — Encounter (HOSPITAL_COMMUNITY): Payer: Self-pay | Admitting: Emergency Medicine

## 2014-09-03 DIAGNOSIS — Y9389 Activity, other specified: Secondary | ICD-10-CM | POA: Insufficient documentation

## 2014-09-03 DIAGNOSIS — I10 Essential (primary) hypertension: Secondary | ICD-10-CM | POA: Insufficient documentation

## 2014-09-03 DIAGNOSIS — G44319 Acute post-traumatic headache, not intractable: Secondary | ICD-10-CM

## 2014-09-03 DIAGNOSIS — Y998 Other external cause status: Secondary | ICD-10-CM | POA: Diagnosis not present

## 2014-09-03 DIAGNOSIS — Z8639 Personal history of other endocrine, nutritional and metabolic disease: Secondary | ICD-10-CM | POA: Insufficient documentation

## 2014-09-03 DIAGNOSIS — Y92009 Unspecified place in unspecified non-institutional (private) residence as the place of occurrence of the external cause: Secondary | ICD-10-CM | POA: Insufficient documentation

## 2014-09-03 DIAGNOSIS — W228XXA Striking against or struck by other objects, initial encounter: Secondary | ICD-10-CM | POA: Diagnosis not present

## 2014-09-03 DIAGNOSIS — S0990XA Unspecified injury of head, initial encounter: Secondary | ICD-10-CM | POA: Insufficient documentation

## 2014-09-03 HISTORY — DX: Pure hypercholesterolemia, unspecified: E78.00

## 2014-09-03 NOTE — ED Provider Notes (Signed)
CSN: [id number]     Arrival date & time 09/03/14  6599 History   First MD Initiated Contact with Patient 09/03/14 [phone]     Chief Complaint  Patient presents with  . Head Injury     (Consider location/radiation/quality/duration/timing/severity/associated sxs/prior Treatment) HPI  [name] is a 69 y.o. male who presents to the Emergency Department complaining of persistent throbbing headache for 5 days.  He states that he struck his head on a metal air duct underneath the house and the headache has been persistent since that time.  He describes a throbbing, piercing sensation to the front of his right head that radiates to the base of his skull.  He has not taken any medications for his symptoms.  He states that he saw his PMD this week and was told that he needed a "cat scan because I might be bleeding in my brain" and states he has been waiting for the appt but no one has contacted him so he came here.  He denies LOC, neck pain, visual changes , dizziness and vomiting.       Past Medical History  Diagnosis Date  . Hypertension   . Hypercholesteremia    Past Surgical History  Procedure Laterality Date  . Eye surgery     History reviewed. No pertinent family history. History  Substance Use Topics  . Smoking status: Never Smoker   . Smokeless tobacco: Not on file  . Alcohol Use: No    Review of Systems  Constitutional: Negative for fever, activity change and appetite change.  HENT: Negative for facial swelling and trouble swallowing.   Eyes: Negative for photophobia, pain and visual disturbance.  Respiratory: Negative for shortness of breath.   Cardiovascular: Negative for chest pain.  Gastrointestinal: Negative for nausea and vomiting.  Musculoskeletal: Negative for neck pain and neck stiffness.  Skin: Negative for rash and wound.  Neurological: Positive for headaches. Negative for dizziness, syncope, facial asymmetry, speech difficulty, weakness and numbness.   Psychiatric/Behavioral: Negative for confusion and decreased concentration.  All other systems reviewed and are negative.     Allergies  Prevacid and Valium  Home Medications   Prior to Admission medications   Not on File   BP 164/92 mmHg  Pulse 78  Temp(Src) 97.9 F (36.6 C) (Oral)  Resp 18  Ht 5\' 8"  (1.727 m)  Wt 156 lb (70.761 kg)  BMI 23.73 kg/m2  SpO2 100% Physical Exam  Constitutional: He is oriented to person, place, and time. He appears well-developed and well-nourished. No distress.  HENT:  Head: Normocephalic.  Mouth/Throat: Oropharynx is clear and moist.  Tenderness to the right frontal scalp.  No abrasion, hematoma.  Eyes: Conjunctivae and EOM are normal. Pupils are equal, round, and reactive to light.  Neck: Normal range of motion and phonation normal. Neck supple. No spinous process tenderness and no muscular tenderness present. No rigidity. No Brudzinski's sign and no Kernig's sign noted.  Pt has full ROM of the neck without pain  Cardiovascular: Normal rate, regular rhythm, normal heart sounds and intact distal pulses.   No murmur heard. Pulmonary/Chest: Effort normal and breath sounds normal. No respiratory distress.  Musculoskeletal: Normal range of motion.  Neurological: He is alert and oriented to person, place, and time. He has normal strength. No cranial nerve deficit or sensory deficit. He exhibits normal muscle tone. Coordination and gait normal. GCS eye subscore is 4. GCS verbal subscore is 5. GCS motor subscore is 6.  Reflex Scores:  Tricep reflexes are 2+ on the right side and 2+ on the left side.      Bicep reflexes are 2+ on the right side and 2+ on the left side. Skin: Skin is warm and dry.  Psychiatric: He has a normal mood and affect.  Nursing note and vitals reviewed.   ED Course  Procedures (including critical care time) Labs Review Labs Reviewed - No data to display  Imaging Review No results found.   EKG  Interpretation None      MDM   Final diagnoses:  Acute post-traumatic headache, not intractable     CT scan is negative for acute injury.  Pt is well appearing.  Vitals stable.  Agrees to close PMD f/u  Or to return here if needed   Kem Parkinson, PA-C 09/05/14 [place], MD 09/05/14 [phone]

## 2014-09-03 NOTE — Discharge Instructions (Signed)
Head Injury  You have a head injury. Headaches and throwing up (vomiting) are common after a head injury. It should be easy to wake up from sleeping. Sometimes you must stay in the hospital. Most problems happen within the first 24 hours. Side effects may occur up to 7-10 days after the injury.   WHAT ARE THE TYPES OF HEAD INJURIES?  Head injuries can be as minor as a bump. Some head injuries can be more severe. More severe head injuries include:  · A jarring injury to the brain (concussion).  · A bruise of the brain (contusion). This mean there is bleeding in the brain that can cause swelling.  · A cracked skull (skull fracture).  · Bleeding in the brain that collects, clots, and forms a bump (hematoma).  WHEN SHOULD I GET HELP RIGHT AWAY?   · You are confused or sleepy.  · You cannot be woken up.  · You feel sick to your stomach (nauseous) or keep throwing up (vomiting).  · Your dizziness or unsteadiness is getting worse.  · You have very bad, lasting headaches that are not helped by medicine. Take medicines only as told by your doctor.  · You cannot use your arms or legs like normal.  · You cannot walk.  · You notice changes in the black spots in the center of the colored part of your eye (pupil).  · You have clear or bloody fluid coming from your nose or ears.  · You have trouble seeing.  During the next 24 hours after the injury, you must stay with someone who can watch you. This person should get help right away (call 911 in the [place] if you start to shake and are not able to control it (have seizures), you pass out, or you are unable to wake up.  HOW CAN I PREVENT A HEAD INJURY IN THE FUTURE?  · Wear seat belts.  · Wear a helmet while bike riding and playing sports like football.  · Stay away from dangerous activities around the house.  WHEN CAN I RETURN TO NORMAL ACTIVITIES AND ATHLETICS?  See your doctor before doing these activities. You should not do normal activities or play contact sports until 1 week  after the following symptoms have stopped:  · Headache that does not go away.  · Dizziness.  · Poor attention.  · Confusion.  · Memory problems.  · Sickness to your stomach or throwing up.  · Tiredness.  · Fussiness.  · Bothered by bright lights or loud noises.  · Anxiousness or depression.  · Restless sleep.  MAKE SURE YOU:   · Understand these instructions.  · Will watch your condition.  · Will get help right away if you are not doing well or get worse.  Document Released: 02/16/2008 Document Revised: 07/20/2013 Document Reviewed: 11/10/2012  [employer]® Patient Information ©2015 [employer]. This information is not intended to replace advice given to you by your health care provider. Make sure you discuss any questions you have with your health care provider.

## 2014-09-03 NOTE — ED Notes (Signed)
Pt states that he hit his head on a metal duct under the house about 5 days ago.  C/o headache.

## 2014-09-03 NOTE — ED Notes (Signed)
No laceration or swelling noted.

## 2014-09-07 ENCOUNTER — Encounter: Payer: Self-pay | Admitting: Cardiovascular Disease

## 2014-12-17 ENCOUNTER — Telehealth: Payer: Self-pay | Admitting: Pharmacist Clinician (PhC)/ Clinical Pharmacy Specialist

## 2014-12-17 DIAGNOSIS — I10 Essential (primary) hypertension: Secondary | ICD-10-CM

## 2014-12-17 DIAGNOSIS — E785 Hyperlipidemia, unspecified: Secondary | ICD-10-CM

## 2014-12-17 NOTE — Telephone Encounter (Signed)
Has question about getting blood work done

## 2014-12-17 NOTE — Telephone Encounter (Signed)
Patient called again

## 2014-12-20 ENCOUNTER — Other Ambulatory Visit: Payer: Self-pay | Admitting: Cardiovascular Disease

## 2014-12-20 LAB — COMPREHENSIVE METABOLIC PANEL
ALK PHOS: 94 U/L (ref 40–115)
ALT: 18 U/L (ref 9–46)
AST: 21 U/L (ref 10–35)
Albumin: 4.6 g/dL (ref 3.6–5.1)
BUN: 10 mg/dL (ref 7–25)
CO2: 28 mmol/L (ref 20–31)
CREATININE: 0.86 mg/dL (ref 0.70–1.25)
Calcium: 9.7 mg/dL (ref 8.6–10.3)
Chloride: 103 mmol/L (ref 98–110)
Glucose, Bld: 118 mg/dL — ABNORMAL HIGH (ref 65–99)
POTASSIUM: 4.4 mmol/L (ref 3.5–5.3)
SODIUM: 137 mmol/L (ref 135–146)
TOTAL PROTEIN: 7.7 g/dL (ref 6.1–8.1)
Total Bilirubin: 0.5 mg/dL (ref 0.2–1.2)

## 2014-12-20 LAB — LIPID PANEL
CHOLESTEROL: 172 mg/dL (ref 125–200)
HDL: 46 mg/dL (ref >=40)
LDL Cholesterol: 106 mg/dL (ref <130)
TRIGLYCERIDES: 99 mg/dL (ref <150)
Total CHOL/HDL Ratio: 3.7 Ratio (ref <=5.0)
VLDL: 20 mg/dL (ref <30)

## 2014-12-20 LAB — TSH: TSH: 19.96 u[IU]/mL — ABNORMAL HIGH (ref 0.350–4.500)

## 2014-12-22 ENCOUNTER — Telehealth: Payer: Self-pay | Admitting: Cardiovascular Disease

## 2014-12-22 NOTE — Telephone Encounter (Signed)
Results given to patient  - he will f/u on TSH w/ PCP at that appt next week.

## 2014-12-22 NOTE — Telephone Encounter (Signed)
Spoke to patient, reviewed results again, voiced understanding.

## 2014-12-22 NOTE — Telephone Encounter (Signed)
Pt would like his lab results from Monday. °

## 2014-12-22 NOTE — Telephone Encounter (Signed)
Mr.Castonguay is calling because he wants someone to explain his lab results to him . Please call   Thanks

## 2014-12-23 ENCOUNTER — Other Ambulatory Visit (HOSPITAL_COMMUNITY): Payer: Self-pay | Admitting: General Surgery

## 2014-12-23 DIAGNOSIS — N62 Hypertrophy of breast: Secondary | ICD-10-CM

## 2015-01-04 ENCOUNTER — Ambulatory Visit (HOSPITAL_COMMUNITY)
Admission: RE | Admit: 2015-01-04 | Discharge: 2015-01-04 | Disposition: A | Discharge status: Home / Self Care | Payer: Medicare Other | Source: Ambulatory Visit | Attending: General Surgery | Admitting: General Surgery

## 2015-01-04 ENCOUNTER — Other Ambulatory Visit (HOSPITAL_COMMUNITY): Payer: Self-pay | Admitting: General Surgery

## 2015-01-04 DIAGNOSIS — N62 Hypertrophy of breast: Secondary | ICD-10-CM

## 2015-02-06 ENCOUNTER — Encounter (HOSPITAL_COMMUNITY): Payer: Self-pay | Admitting: *Deleted

## 2015-02-06 ENCOUNTER — Emergency Department (HOSPITAL_COMMUNITY)
Admission: EM | Admit: 2015-02-06 | Discharge: 2015-02-06 | Disposition: A | Discharge status: Home / Self Care | Payer: Medicare Other | Attending: Emergency Medicine | Admitting: Emergency Medicine

## 2015-02-06 DIAGNOSIS — I10 Essential (primary) hypertension: Secondary | ICD-10-CM | POA: Insufficient documentation

## 2015-02-06 DIAGNOSIS — E785 Hyperlipidemia, unspecified: Secondary | ICD-10-CM | POA: Insufficient documentation

## 2015-02-06 DIAGNOSIS — Z8547 Personal history of malignant neoplasm of testis: Secondary | ICD-10-CM | POA: Insufficient documentation

## 2015-02-06 DIAGNOSIS — Z86010 Personal history of colonic polyps: Secondary | ICD-10-CM | POA: Diagnosis not present

## 2015-02-06 DIAGNOSIS — E7800 Pure hypercholesterolemia, unspecified: Secondary | ICD-10-CM | POA: Insufficient documentation

## 2015-02-06 DIAGNOSIS — K219 Gastro-esophageal reflux disease without esophagitis: Secondary | ICD-10-CM | POA: Insufficient documentation

## 2015-02-06 DIAGNOSIS — Z7982 Long term (current) use of aspirin: Secondary | ICD-10-CM | POA: Diagnosis not present

## 2015-02-06 DIAGNOSIS — R002 Palpitations: Secondary | ICD-10-CM

## 2015-02-06 DIAGNOSIS — Z8619 Personal history of other infectious and parasitic diseases: Secondary | ICD-10-CM | POA: Diagnosis not present

## 2015-02-06 DIAGNOSIS — F419 Anxiety disorder, unspecified: Secondary | ICD-10-CM | POA: Diagnosis not present

## 2015-02-06 DIAGNOSIS — I493 Ventricular premature depolarization: Secondary | ICD-10-CM

## 2015-02-06 DIAGNOSIS — Z79899 Other long term (current) drug therapy: Secondary | ICD-10-CM | POA: Insufficient documentation

## 2015-02-06 DIAGNOSIS — R358 Other polyuria: Secondary | ICD-10-CM | POA: Insufficient documentation

## 2015-02-06 LAB — BASIC METABOLIC PANEL
Anion gap: 8 (ref 5–15)
BUN: 10 mg/dL (ref 6–20)
CO2: 25 mmol/L (ref 22–32)
Calcium: 9.5 mg/dL (ref 8.9–10.3)
Chloride: 103 mmol/L (ref 101–111)
Creatinine, Ser: 0.89 mg/dL (ref 0.61–1.24)
GFR calc Af Amer: >60 mL/min (ref >60)
GFR calc non Af Amer: >60 mL/min (ref >60)
Glucose, Bld: 163 mg/dL — ABNORMAL HIGH (ref 65–99)
Potassium: 3.7 mmol/L (ref 3.5–5.1)
Sodium: 136 mmol/L (ref 135–145)

## 2015-02-06 LAB — URINALYSIS, ROUTINE W REFLEX MICROSCOPIC
Bilirubin Urine: NEGATIVE
Glucose, UA: NEGATIVE mg/dL
Hgb urine dipstick: NEGATIVE
Ketones, ur: NEGATIVE mg/dL
Leukocytes, UA: NEGATIVE
Nitrite: NEGATIVE
Protein, ur: NEGATIVE mg/dL
Specific Gravity, Urine: 1.015 (ref 1.005–1.030)
pH: 8 (ref 5.0–8.0)

## 2015-02-06 LAB — CBC WITH DIFFERENTIAL/PLATELET
Basophils Absolute: 0.1 10*3/uL (ref 0.0–0.1)
Basophils Relative: 1 %
Eosinophils Absolute: 0.1 10*3/uL (ref 0.0–0.7)
Eosinophils Relative: 2 %
HCT: 40.6 % (ref 39.0–52.0)
Hemoglobin: 13.1 g/dL (ref 13.0–17.0)
Lymphocytes Relative: 17 %
Lymphs Abs: 1.2 10*3/uL (ref 0.7–4.0)
MCH: 28.9 pg (ref 26.0–34.0)
MCHC: 32.3 g/dL (ref 30.0–36.0)
MCV: 89.4 fL (ref 78.0–100.0)
Monocytes Absolute: 0.7 10*3/uL (ref 0.1–1.0)
Monocytes Relative: 10 %
Neutro Abs: 5 10*3/uL (ref 1.7–7.7)
Neutrophils Relative %: 70 %
Platelets: 271 10*3/uL (ref 150–400)
RBC: 4.54 MIL/uL (ref 4.22–5.81)
RDW: 12.4 % (ref 11.5–15.5)
WBC: 7.1 10*3/uL (ref 4.0–10.5)

## 2015-02-06 LAB — TROPONIN I: Troponin I: <0.03 ng/mL (ref <0.031)

## 2015-02-06 NOTE — Discharge Instructions (Signed)
Premature Ventricular Contraction A premature ventricular contraction is an irregularity in the normal heart rhythm. These contractions are extra heartbeats that occur too early in the normal sequence. In most cases, these contractions are harmless and do not require treatment. CAUSES Premature ventricular contractions may occur without a known cause. In healthy people, the extra contractions may be caused by:  Smoking.  Drinking alcohol.  Caffeine.  Certain medicines.  Some illegal drugs.  Stress. Sometimes, changes in chemicals in the blood (electrolytes) can also cause premature ventricular contractions. They can also occur in people with heart diseases that cause a decrease in blood flow to the heart. SIGNS AND SYMPTOMS Premature ventricular contractions often do not cause any symptoms. In some cases, you may have a feeling of your heart beating fast or skipping a beat (palpitations). DIAGNOSIS Your health care provider will take your medical history and do a physical exam. During the exam, the health care provider will check for irregular heartbeats. Various tests may be done to help diagnose premature ventricular contractions. These tests may include:  An ECG (electrocardiogram) to monitor the electrical activity of your heart.  Holter monitor testing. A Holter monitor is a portable device that can monitor the electrical activity of your heart over longer periods of time.  Stress tests to see how exercise affects your heart rhythm.  Echocardiogram. This test uses sound waves (ultrasound) to produce an image of your heart.  Electrophysiology study. This is used to evaluate the electrical conduction system of your heart. TREATMENT Usually, no treatment is needed. You may be advised to avoid things that can trigger the premature contractions, such as caffeine or alcohol. Medicines are sometimes given if symptoms are severe or if the extra heartbeats are very frequent. Treatment may  also be needed for an underlying cause of the contractions if one is found. HOME CARE INSTRUCTIONS  Take medicines only as directed by your health care provider.  Make any lifestyle changes recommended by your health care provider. These may include:  Quitting smoking.  Avoiding or limiting caffeine or alcohol.  Exercising. Talk to your health care provider about what type of exercise is safe for you.  Trying to reduce stress.  Keep all follow-up visits with your health care provider. This is important. SEEK IMMEDIATE MEDICAL CARE IF:  You feel palpitations that are frequent or continual.  You have chest pain.  You have shortness of breath.  You have sweating for no reason.   Palpitations A palpitation is the feeling that your heartbeat is irregular or is faster than normal. It may feel like your heart is fluttering or skipping a beat. Palpitations are usually not a serious problem. However, in some cases, you may need further medical evaluation. CAUSES  Palpitations can be caused by: Smoking. Caffeine or other stimulants, such as diet pills or energy drinks. Alcohol. Stress and anxiety. Strenuous physical activity. Fatigue. Certain medicines. Heart disease, especially if you have a history of irregular heart rhythms (arrhythmias), such as atrial fibrillation, atrial flutter, or supraventricular tachycardia. An improperly working pacemaker or defibrillator. DIAGNOSIS  To find the cause of your palpitations, your health care provider will take your medical history and perform a physical exam. Your health care provider may also have you take a test called an ambulatory electrocardiogram (ECG). An ECG records your heartbeat patterns over a 24-hour period. You may also have other tests, such as: Transthoracic echocardiogram (TTE). During echocardiography, sound waves are used to evaluate how blood flows through your heart. Transesophageal  echocardiogram (TEE). Cardiac  monitoring. This allows your health care provider to monitor your heart rate and rhythm in real time. Holter monitor. This is a portable device that records your heartbeat and can help diagnose heart arrhythmias. It allows your health care provider to track your heart activity for several days, if needed. Stress tests by exercise or by giving medicine that makes the heart beat faster. TREATMENT  Treatment of palpitations depends on the cause of your symptoms and can vary greatly. Most cases of palpitations do not require any treatment other than time, relaxation, and monitoring your symptoms. Other causes, such as atrial fibrillation, atrial flutter, or supraventricular tachycardia, usually require further treatment. HOME CARE INSTRUCTIONS  Avoid: Caffeinated coffee, tea, soft drinks, diet pills, and energy drinks. Chocolate. Alcohol. Stop smoking if you smoke. Reduce your stress and anxiety. Things that can help you relax include: A method of controlling things in your body, such as your heartbeats, with your mind (biofeedback). Yoga. Meditation. Physical activity such as swimming, jogging, or walking. Get plenty of rest and sleep. SEEK MEDICAL CARE IF:  You continue to have a fast or irregular heartbeat beyond 24 hours. Your palpitations occur more often. SEEK IMMEDIATE MEDICAL CARE IF: You have chest pain or shortness of breath. You have a severe headache. You feel dizzy or you faint. MAKE SURE YOU: Understand these instructions. Will watch your condition. Will get help right away if you are not doing well or get worse.   This information is not intended to replace advice given to you by your health care provider. Make sure you discuss any questions you have with your health care provider.   Document Released: 03/02/2000 Document Revised: 03/10/2013 Document Reviewed: 05/04/2011 Elsevier Interactive Patient Education 2016 [place] have nausea and vomiting.  You  become light-headed or faint.   This information is not intended to replace advice given to you by your health care provider. Make sure you discuss any questions you have with your health care provider.   Document Released: 10/21/2003 Document Revised: 03/26/2014 Document Reviewed: 08/06/2013 Elsevier Interactive Patient Education [employer].

## 2015-02-06 NOTE — ED Provider Notes (Signed)
CSN: [id number]     Arrival date & time 02/06/15  [id number] History  By signing my name below, I, [name], attest that this documentation has been prepared under the direction and in the presence of Miguel Manifold, MD. Electronically Signed: Meriel Parker, ED Scribe. 02/06/2015. 8:19 AM.   Chief Complaint  Patient presents with  . Palpitations   The history is provided by the patient. No language interpreter was used.   HPI Comments: [name] is a 69 y.o. male, with a PMhx of anxiety, HLD, and HTN, who presents to the Emergency Department complaining of intermittent palpitations X 1 day. He describes the the palpitations to be an irregular rhythm, skipping a beat and pausing. His palpitations are experienced predominantly when lying down but did subside while walking around the house yesterday. He notes a h/o GERD and states he took a Protonix for his symptoms yesterday without relief. Pt recently began taking a new prescription of levothyroxine 3 days ago as prescribed by his PCP; but he is not taking any other new medications. He additionally reports polyuria recently that worsened last night causing him to get up to use the restroom 5 times. He admits his PCP has told him he has been hyperglycemic on past visits but he is not on DM medication. Denies CP, SOB, and BLE edema. No excessive caffeine use. Pt last saw his cardiologist 5 months ago and had an unremarkable visit.   Cardiologist: Miguel Riles, MD  PCP: Miguel Bears, MD  Past Medical History  Diagnosis Date  . Anxiety   . Hyperlipidemia   . H. pylori infection   . GERD (gastroesophageal reflux disease)   . Personal history of colonic polyps 2 9 2007  . Palpitations     event monior 04/11/05- SR with occ PAC  . Chest pain     myoview 07/09/12-normal, nl ef; echo 04/12/05-ef>55%, mild aortic sclerosis  . Headache   . 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 Hypertension   . Hypercholesteremia    Past Surgical History  Procedure  Laterality Date  . Back surgery  in2007, 2 ruptured dics  2007    2 ruptured discs  . Colonoscopy  12/06/2010    Procedure: COLONOSCOPY;  Surgeon: Rogene Houston, MD;  Location: [facility];  Service: Endoscopy;  Laterality: N/A;  . Cataract extraction w/phaco Left 12/08/2012    Procedure: CATARACT EXTRACTION PHACO AND INTRAOCULAR LENS PLACEMENT (IOC);  Surgeon: Tonny Branch, MD;  Location: [facility] ORS;  Service: Ophthalmology;  Laterality: Left;  CDE:  23.19  . Cataract extraction w/phaco Right 04/15/2014    Procedure: CATARACT EXTRACTION PHACO AND INTRAOCULAR LENS PLACEMENT RIGHT;  Surgeon: Tonny Branch, MD;  Location: [facility] ORS;  Service: Ophthalmology;  Laterality: Right;  CDE:10.32  . Eye surgery     No family history on file. Social History  Substance Use Topics  . Smoking status: Never Smoker   . Smokeless tobacco: None  . Alcohol Use: No    Review of Systems  Constitutional: Negative for fever.  Respiratory: Negative for shortness of breath.   Cardiovascular: Positive for palpitations. Negative for chest pain and leg swelling.  Endocrine: Positive for polyuria.  All other systems reviewed and are negative.  Allergies  Lisinopril; Chlorthalidone; Diazepam; Hyoscyamine; Lansoprazole; Prevacid; and Valium  Home Medications   Prior to Admission medications   Medication Sig Start Date End Date Taking? Authorizing Provider  amLODipine (NORVASC) 5 MG tablet Take 1 tablet (5 mg total)  by mouth daily. 07/01/14   Lorretta Harp, MD  amLODipine (NORVASC) 5 MG tablet Take 5 mg by mouth daily.    Historical Provider, MD  aspirin 81 MG tablet Take 81 mg by mouth daily.     Historical Provider, MD  aspirin EC 81 MG tablet Take 81 mg by mouth daily.    Historical Provider, MD  atorvastatin (LIPITOR) 10 MG tablet Take 10 mg by mouth at bedtime.    Historical Provider, MD  atorvastatin (LIPITOR) 10 MG tablet Take 10 mg by mouth daily.    Historical Provider, MD  pantoprazole (PROTONIX) 40 MG tablet  Take 40 mg by mouth daily as needed (acid reflux).  05/22/13   Brittainy M Simmons, PA-C   BP 187/114 mmHg  Pulse 107  Temp(Src) 97.8 F (36.6 C) (Oral)  Resp 17  Ht 5\' 8"  (1.727 m)  Wt 150 lb (68.04 kg)  BMI 22.81 kg/m2  SpO2 100% Physical Exam  Constitutional: He is oriented to person, place, and time. He appears well-developed and well-nourished.  Mildly anxious.  HENT:  Head: Normocephalic and atraumatic.  Eyes: EOM are normal.  Neck: Normal range of motion.  Cardiovascular: Normal rate and normal heart sounds.   PVCs noted on monitor corresponding to pt's symptoms.   Pulmonary/Chest: Effort normal and breath sounds normal. No respiratory distress.  Musculoskeletal: Normal range of motion.  Neurological: He is alert and oriented to person, place, and time.  Skin: Skin is warm and dry.  Psychiatric: He has a normal mood and affect. Judgment normal.  Nursing note and vitals reviewed.  ED Course  Procedures  DIAGNOSTIC STUDIES: Oxygen Saturation is 100% on RA, normal by my interpretation.    COORDINATION OF CARE: 8:17 AM Discussed treatment plan with pt at bedside and pt agreed to plan.  Labs Review Labs Reviewed  BASIC METABOLIC PANEL - Abnormal; Notable for the following:    Glucose, Bld 163 (*)    All other components within normal limits  CBC WITH DIFFERENTIAL/PLATELET  TROPONIN I  URINALYSIS, ROUTINE W REFLEX MICROSCOPIC (NOT AT [facility])    I have personally reviewed and evaluated these lab results as part of my medical decision-making.   EKG Interpretation None      MDM   Final diagnoses:  Palpitations  PVC's (premature ventricular contractions)    69yM with palpitations. Somewhat frequent PVCs noted on monitor which correspond with his symptoms. Suspect this is etiology. Noticed then less yesterday when active and much more symptomatic at rest, particularly when laying in bed last night. Fairly anxious person. Tachycardic and very hypertensive on arrival  to ED. When I examined him in room HR was in 70-80s and BP 130-140/70-80. Basic labs checked and   .  Previously cardiology notes reviewed. Seems to have structurally normal heart. Normal myoview 2014 and normal ECHO 2015. Most likely benign. Reassurance provided. Return precautions were discussed. Can discuss further with PCP or cardiologist if continues to be ongoing issue.   I personally preformed the services scribed in my presence. The recorded information has been reviewed is accurate. Miguel Manifold, MD.    Miguel Manifold, MD 02/19/15 [phone]

## 2015-02-06 NOTE — ED Notes (Signed)
Pt states his heart has been skipping beats all night. Symptoms seem better with standing. States he may also be dehydrated, stating that he was up to the bathroom all during the night.

## 2015-02-06 NOTE — ED Notes (Signed)
MD Kohut at bedside updating patient and family. 

## 2015-02-08 ENCOUNTER — Telehealth: Payer: Self-pay | Admitting: Cardiovascular Disease

## 2015-02-08 NOTE — Telephone Encounter (Signed)
Spoke to patient. He was in ED for eval of CP, palpitations the other night. "very nervous feeling". He has been having ongoing issues w/ this. Had episode of CP early AM. This resolved. He is uncertain if protonix helped, notes sometimes it does but that usually CP resolves independently.  Recent labwork Dr. [name] ordered showed elevated TSH. He was referred to his primary physician who started him on synthroid 50 mcg daily. Pt reports for the past couple days, he did not take the medication due to concern that it was making his symptoms of nervousness worse. Took opportunity to give pt education on intent of this medication, advised to call PCP and also to resume medication daily.   As ED physician Dr. Wilson Singer wanted pt to f/u w/ cardiology and primary care, I set pt up w post ED visit w/ clinician. He will see Truitt Merle tomorrow for evaluation. I also reiterated recommendation to call primary and set up visit w/ them. There was concern for elevated glucose on recent lab tests, as well as the TSH.  Explained he may need recheck on these and/or med dose changes at PCP direction.  Pt aware of instructions and appt time tomorrow.

## 2015-02-08 NOTE — Telephone Encounter (Signed)
[name] went to the E/ R on Sunday morning and was told that he is having PVC's and was having chest pains on last night . Wanted to come in to see someone , per the E/R told him he needed to see his Doctor. Please call    Thanks

## 2015-02-08 NOTE — Telephone Encounter (Signed)
Please call the cell number [phone]

## 2015-02-09 ENCOUNTER — Ambulatory Visit: Payer: Medicare Other | Admitting: Nurse Practitioner

## 2015-02-21 ENCOUNTER — Telehealth: Payer: Self-pay | Admitting: Cardiovascular Disease

## 2015-02-21 NOTE — Telephone Encounter (Signed)
Received records from [facility] for appointment on 03/04/15 with Dr [name].  Records given to [facility] (medical records) for Dr Kennon Holter schedule on 03/04/15. lp

## 2015-02-25 ENCOUNTER — Encounter: Payer: Self-pay | Admitting: Cardiovascular Disease

## 2015-02-25 ENCOUNTER — Ambulatory Visit (INDEPENDENT_AMBULATORY_CARE_PROVIDER_SITE_OTHER): Payer: Medicare Other | Admitting: Cardiovascular Disease

## 2015-02-25 VITALS — BP 166/78 | HR 85 | Ht 68.0 in | Wt 149.0 lb

## 2015-02-25 DIAGNOSIS — I10 Essential (primary) hypertension: Secondary | ICD-10-CM | POA: Diagnosis not present

## 2015-02-25 DIAGNOSIS — R002 Palpitations: Secondary | ICD-10-CM

## 2015-02-25 DIAGNOSIS — E785 Hyperlipidemia, unspecified: Secondary | ICD-10-CM

## 2015-02-25 NOTE — Progress Notes (Signed)
02/25/2015 NICKALAUS SHERFIELD   1945-09-11  [id number]  Primary Physician Glenda Chroman., MD Primary Cardiologist: Lorretta Harp MD Renae Gloss   HPI:  Mr. Painton is a 69 year old well-appearing Caucasian male who I saw 08/12/14. He has a history of hypertension and hyperlipidemia. He had atypical chest pain here tone with a negative Myoview/23/14. His pain improved with proton pump inhibition. Because of recurrent chest pain he saw [name], PA-C in the office who did obtain a 2-D echo and Myoview perfusion study all normal. His pain has improved again on a PPI suggesting that it was related to GERD. Since I saw Mr. Gosnell back in May of this year he developed increasing palpitations. His primary care physician and endocrinologist In adjusting his thyroid medications..   Current Outpatient Prescriptions  Medication Sig Dispense Refill  . amLODipine (NORVASC) 5 MG tablet Take 1 tablet (5 mg total) by mouth daily. 90 tablet 2  . aspirin 81 MG tablet Take 81 mg by mouth every other day.     Marland Kitchen atorvastatin (LIPITOR) 10 MG tablet Take 10 mg by mouth daily.    Marland Kitchen levothyroxine (SYNTHROID, LEVOTHROID) 50 MCG tablet Take 25 mcg by mouth daily before breakfast.     . pantoprazole (PROTONIX) 40 MG tablet Take 40 mg by mouth daily as needed (acid reflux).      No current facility-administered medications for this visit.    Allergies  Allergen Reactions  . Lisinopril Swelling    H/o of angioedema   . Chlorthalidone Other (See Comments)    Causes severe hyponatremia  . Diazepam     REACTION: hyperactivity  . Hyoscyamine Swelling  . Lansoprazole     REACTION: oral swelling  . Prevacid [Lansoprazole] Swelling  . Valium [Diazepam]     "makes me feel crazy"    Social History   Social History  . Marital Status: Married    Spouse Name: N/A  . Number of Children: N/A  . Years of Education: N/A   Occupational History  . Not on file.   Social History Main Topics  .  Smoking status: Never Smoker   . Smokeless tobacco: Not on file  . Alcohol Use: No  . Drug Use: No  . Sexual Activity: Not on file   Other Topics Concern  . Not on file   Social History Narrative   ** Merged History Encounter **         Review of Systems: General: negative for chills, fever, night sweats or weight changes.  Cardiovascular: negative for chest pain, dyspnea on exertion, edema, orthopnea, palpitations, paroxysmal nocturnal dyspnea or shortness of breath Dermatological: negative for rash Respiratory: negative for cough or wheezing Urologic: negative for hematuria Abdominal: negative for nausea, vomiting, diarrhea, bright red blood per rectum, melena, or hematemesis Neurologic: negative for visual changes, syncope, or dizziness All other systems reviewed and are otherwise negative except as noted above.    Blood pressure 166/78, pulse 85, height 5\' 8"  (1.727 m), weight 149 lb (67.586 kg).  General appearance: alert and no distress Neck: no adenopathy, no carotid bruit, no JVD, supple, symmetrical, trachea midline and thyroid not enlarged, symmetric, no tenderness/mass/nodules Lungs: clear to auscultation bilaterally Heart: regular rate and rhythm, S1, S2 normal, no murmur, click, rub or gallop Extremities: extremities normal, atraumatic, no cyanosis or edema  EKG not performed today  ASSESSMENT AND PLAN:   Palpitations Mr. Webb has had complaints of palpitations recently. His primary care physician and endocrinologist separated  adjusting his thyroid medications. He did have an event monitor back in 2017 that showed sinus rhythm with PACs. Abdomen repeated. A vent monitor to further evaluate his palpitations and may ultimately begin a low-dose beta blocker empirically.  Hyperlipidemia History of hyperlipidemia on atorvastatin with recent lipid profile performed 12/20/14 revealing total cholesterol 172, LDL 106 and HDL 46  HTN (hypertension) History of  hypertension blood pressure measured today 166/78. He is on amlodipine. Continue current meds at current dosing      Lorretta Harp MD [facility], [facility] 02/25/2015 1:48 PM

## 2015-02-25 NOTE — Assessment & Plan Note (Signed)
History of hypertension blood pressure measured today 166/78. He is on amlodipine. Continue current meds at current dosing

## 2015-02-25 NOTE — Patient Instructions (Signed)
Medication Instructions:  Your physician recommends that you continue on your current medications as directed. Please refer to the Current Medication list given to you today.   Labwork: none  Testing/Procedures: Your physician has recommended that you wear an event monitor. Event monitors are medical devices that record the heart's electrical activity. Doctors most often [name] these monitors to diagnose arrhythmias. Arrhythmias are problems with the speed or rhythm of the heartbeat. The monitor is a small, portable device. You can wear one while you do your normal daily activities. This is usually used to diagnose what is causing palpitations/syncope (passing out).    Follow-Up: Your physician recommends that you schedule a follow-up appointment in: 6 weeks with Dr. [name].   Any Other Special Instructions Will Be Listed Below (If Applicable).     If you need a refill on your cardiac medications before your next appointment, please call your pharmacy.

## 2015-02-25 NOTE — Assessment & Plan Note (Signed)
History of hyperlipidemia on atorvastatin with recent lipid profile performed 12/20/14 revealing total cholesterol 172, LDL 106 and HDL 46

## 2015-02-25 NOTE — Assessment & Plan Note (Signed)
Mr. Mounger has had complaints of palpitations recently. His primary care physician and endocrinologist separated adjusting his thyroid medications. He did have an event monitor back in 2017 that showed sinus rhythm with PACs. Abdomen repeated. A vent monitor to further evaluate his palpitations and may ultimately begin a low-dose beta blocker empirically.

## 2015-02-28 ENCOUNTER — Ambulatory Visit (INDEPENDENT_AMBULATORY_CARE_PROVIDER_SITE_OTHER): Payer: Medicare Other

## 2015-02-28 DIAGNOSIS — R002 Palpitations: Secondary | ICD-10-CM

## 2015-03-04 ENCOUNTER — Ambulatory Visit: Payer: Self-pay | Admitting: Cardiovascular Disease

## 2015-03-08 ENCOUNTER — Ambulatory Visit: Payer: Self-pay | Admitting: Cardiovascular Disease

## 2015-04-05 ENCOUNTER — Ambulatory Visit (INDEPENDENT_AMBULATORY_CARE_PROVIDER_SITE_OTHER): Payer: Medicare Other | Admitting: Cardiovascular Disease

## 2015-04-05 ENCOUNTER — Encounter: Payer: Self-pay | Admitting: Cardiovascular Disease

## 2015-04-05 VITALS — BP 180/80 | HR 93 | Ht 68.0 in | Wt 149.0 lb

## 2015-04-05 DIAGNOSIS — R002 Palpitations: Secondary | ICD-10-CM

## 2015-04-05 NOTE — Assessment & Plan Note (Signed)
Mr. Geers returns today for follow-up of palpitations. The third event monitor was unrevealing though on exam he does appear to have isolated PVCs. He stopped taking his thyroid medicine several weeks ago and his palpitations have improved. I have reassured him that these are not pathologic nor does he need to be on any additional medications. He is a very nervous/anxious gentleman.

## 2015-04-05 NOTE — Progress Notes (Signed)
[name] returns today for follow-up of palpitations. The 30 day event monitor was unrevealing though on exam he does appear to have isolated PVCs. He stopped taking his thyroid medicine several weeks ago and his palpitations have improved. I have reassured him that these are not pathologic nor does he need to be on any additional medications. He is a very nervous/anxious gentleman.

## 2015-04-05 NOTE — Patient Instructions (Signed)

## 2015-04-08 ENCOUNTER — Ambulatory Visit: Payer: Medicare Other | Admitting: Cardiovascular Disease

## 2015-04-13 ENCOUNTER — Telehealth: Payer: Self-pay | Admitting: Cardiovascular Disease

## 2015-04-13 NOTE — Telephone Encounter (Signed)
Returning your call from yesterday. °

## 2015-04-13 NOTE — Telephone Encounter (Signed)
Pt given results of monitor

## 2015-05-17 ENCOUNTER — Other Ambulatory Visit: Payer: Self-pay | Admitting: Cardiovascular Disease

## 2015-05-17 MED ORDER — AMLODIPINE BESYLATE 5 MG PO TABS: 5.0000 mg | ORAL_TABLET | Freq: Every day | ORAL | Status: DC

## 2015-05-20 ENCOUNTER — Emergency Department (HOSPITAL_COMMUNITY)
Admission: EM | Admit: 2015-05-20 | Discharge: 2015-05-21 | Disposition: A | Discharge status: Home / Self Care | Payer: Medicare Other | Attending: Emergency Medicine | Admitting: Emergency Medicine

## 2015-05-20 ENCOUNTER — Encounter (HOSPITAL_COMMUNITY): Payer: Self-pay | Admitting: *Deleted

## 2015-05-20 DIAGNOSIS — Z8547 Personal history of malignant neoplasm of testis: Secondary | ICD-10-CM | POA: Diagnosis not present

## 2015-05-20 DIAGNOSIS — R05 Cough: Secondary | ICD-10-CM | POA: Diagnosis not present

## 2015-05-20 DIAGNOSIS — D72829 Elevated white blood cell count, unspecified: Secondary | ICD-10-CM

## 2015-05-20 DIAGNOSIS — Z9889 Other specified postprocedural states: Secondary | ICD-10-CM | POA: Diagnosis not present

## 2015-05-20 DIAGNOSIS — R059 Cough, unspecified: Secondary | ICD-10-CM

## 2015-05-20 DIAGNOSIS — R0981 Nasal congestion: Secondary | ICD-10-CM | POA: Diagnosis not present

## 2015-05-20 DIAGNOSIS — Z7982 Long term (current) use of aspirin: Secondary | ICD-10-CM | POA: Diagnosis not present

## 2015-05-20 DIAGNOSIS — R110 Nausea: Secondary | ICD-10-CM | POA: Insufficient documentation

## 2015-05-20 DIAGNOSIS — Z79899 Other long term (current) drug therapy: Secondary | ICD-10-CM | POA: Diagnosis not present

## 2015-05-20 DIAGNOSIS — E785 Hyperlipidemia, unspecified: Secondary | ICD-10-CM | POA: Diagnosis not present

## 2015-05-20 DIAGNOSIS — R1013 Epigastric pain: Secondary | ICD-10-CM | POA: Diagnosis present

## 2015-05-20 DIAGNOSIS — I10 Essential (primary) hypertension: Secondary | ICD-10-CM | POA: Insufficient documentation

## 2015-05-20 DIAGNOSIS — R1010 Upper abdominal pain, unspecified: Secondary | ICD-10-CM

## 2015-05-20 MED ORDER — ONDANSETRON HCL 4 MG/2ML IJ SOLN: 4.0000 mg | Freq: Once | INTRAMUSCULAR | Status: DC

## 2015-05-20 MED ORDER — SODIUM CHLORIDE 0.9 % IV BOLUS (SEPSIS)
1000.0000 mL | Freq: Once | INTRAVENOUS | Status: AC
  Administered 2015-05-21: 1000 mL via INTRAVENOUS

## 2015-05-20 MED ORDER — ONDANSETRON HCL 4 MG/2ML IJ SOLN
4.0000 mg | Freq: Once | INTRAMUSCULAR | Status: AC | PRN
  Administered 2015-05-20: 4 mg via INTRAVENOUS
  Filled 2015-05-20: qty 2

## 2015-05-20 MED ORDER — FENTANYL CITRATE (PF) 100 MCG/2ML IJ SOLN
50.0000 ug | INTRAMUSCULAR | Status: DC | PRN
  Administered 2015-05-21: 50 ug via INTRAVENOUS
  Filled 2015-05-20: qty 2

## 2015-05-20 NOTE — ED Notes (Signed)
Pt c/o abd pain for the past week, n/v today with diarrhea, has been evaluated by pcp for ?bacteria in stomach, has been using protonix which seemed to help until today,

## 2015-05-20 NOTE — ED Provider Notes (Signed)
CSN: [id number]     Arrival date & time 05/20/15  2309 History  By signing my name below, I, [name], attest that this documentation has been prepared under the direction and in the presence of [name], MD . Electronically Signed: [name], Scribe. 05/20/2015. 11:22 PM.    Chief Complaint  Patient presents with  . Emesis    HPI Comments: Pt complains of emesis  Patient is a 70 y.o. male presenting with abdominal pain. The history is provided by the patient. No language interpreter was used.  Abdominal Pain Pain location:  Epigastric Pain quality: cramping   Pain radiates to:  Does not radiate Pain severity:  Severe Onset quality:  Sudden Duration:  1 week Timing:  Constant Progression:  Unchanged Chronicity:  New Context: eating   Worsened by:  Palpation Associated symptoms: cough and nausea   Associated symptoms: no dysuria   Cough:    Cough characteristics:  Dry   Severity:  Mild   Onset quality:  Sudden    HPI Comments: [name] is a 70 y.o. male with h/o testicular cancer who presents to the Emergency Department complaining of sudden onset, constant, severe, abdominal pain for the past two weeks. Pt notes associated cough for the past few days. Pt states that he went to a doctor a couple of weeks ago and had a blood test on his stomach; the results came back clear. He states that he has been vomiting recently and vomited earlier tonight. Pt reports that he has been experiencing abdominal pain while eating for the last few weeks and often vomits after eating. Pt's stomach started hurting severely after eating earlier tonight. Pt reports that his abdominal pain will last for a few hours when it presents. He endorses experiencing associated diarrhea recently. Pt has taken Protonix, Prednisone, and a thyroid medication with no relief; he states that his thyroid medication induced his nausea. Pt had a colonoscopy in September; pt had two polyps. Pt has not had a  cholecystectomy. He has lost 8 pounds recently due to vomiting and decreased appetite. Pt reports pain exacerbation with palpation to lower middle abdomen. He has not had a CT scan for his current symptoms. Pt denies dysuria, increased urinary frequency, or any other associated symptoms at this time.   Past Medical History  Diagnosis Date  . Anxiety   . Hyperlipidemia   . H. pylori infection   . GERD (gastroesophageal reflux disease)   . Personal history of colonic polyps 2 9 2007  . Palpitations     event monior 04/11/05- SR with occ PAC  . Chest pain     myoview 07/09/12-normal, nl ef; echo 04/12/05-ef>55%, mild aortic sclerosis  . Headache   . Cancer ([place])     testicular   . Hypertension   . Hypercholesteremia    Past Surgical History  Procedure Laterality Date  . Back surgery  in2007, 2 ruptured dics  2007    2 ruptured discs  . Colonoscopy  12/06/2010    Procedure: COLONOSCOPY;  Surgeon: Rogene Houston, MD;  Location: [facility];  Service: Endoscopy;  Laterality: N/A;  . Cataract extraction w/phaco Left 12/08/2012    Procedure: CATARACT EXTRACTION PHACO AND INTRAOCULAR LENS PLACEMENT (IOC);  Surgeon: Tonny Branch, MD;  Location: [facility];  Service: Ophthalmology;  Laterality: Left;  CDE:  23.19  . Cataract extraction w/phaco Right 04/15/2014    Procedure: CATARACT EXTRACTION PHACO AND INTRAOCULAR LENS PLACEMENT RIGHT;  Surgeon: Tonny Branch, MD;  Location: [facility];  Service: Ophthalmology;  Laterality: Right;  CDE:10.32  . Eye surgery     No family history on file. Social History  Substance Use Topics  . Smoking status: Never Smoker   . Smokeless tobacco: None  . Alcohol Use: No    Review of Systems  Constitutional: Positive for appetite change (decreased) and unexpected weight change.  HENT: Positive for congestion.   Respiratory: Positive for cough.   Gastrointestinal: Positive for nausea and abdominal pain.  Genitourinary: Negative for dysuria and frequency.  All other  systems reviewed and are negative.     Allergies  Lisinopril; Chlorthalidone; Diazepam; Hyoscyamine; Lansoprazole; Prevacid; and Valium  Home Medications   Prior to Admission medications   Medication Sig Start Date End Date Taking? Authorizing Provider  amLODipine (NORVASC) 5 MG tablet Take 1 tablet (5 mg total) by mouth daily. 05/17/15  Yes Lorretta Harp, MD  aspirin 81 MG tablet Take 81 mg by mouth every other day.    Yes Historical Provider, MD  atorvastatin (LIPITOR) 10 MG tablet Take 10 mg by mouth daily.   Yes Historical Provider, MD  pantoprazole (PROTONIX) 40 MG tablet Take 40 mg by mouth daily as needed (acid reflux).  05/22/13  Yes Brittainy Erie Noe, PA-C  doxycycline (VIBRAMYCIN) 100 MG capsule Take 1 capsule (100 mg total) by mouth 2 (two) times daily. One po bid x 7 days 05/21/15   [name], MD   BP 136/73 mmHg  Pulse 86  Temp(Src) 98.7 F (37.1 C) (Oral)  Resp 18  Ht 5\' 8"  (1.727 m)  Wt 143 lb (64.864 kg)  BMI 21.75 kg/m2  SpO2 97% Physical Exam  Constitutional: He is oriented to person, place, and time. He appears well-developed and well-nourished. No distress.  HENT:  Head: Normocephalic and atraumatic.  Eyes: Conjunctivae and EOM are normal.  Neck: Neck supple. No tracheal deviation present.  Cardiovascular: Normal rate and regular rhythm.   Pulmonary/Chest: Effort normal and breath sounds normal. No respiratory distress.  Abdominal: Bowel sounds are normal. There is tenderness in the right upper quadrant and epigastric area.  Musculoskeletal: Normal range of motion.  Neurological: He is alert and oriented to person, place, and time.  Skin: Skin is warm and dry.  Psychiatric: He has a normal mood and affect. His behavior is normal.  Nursing note and vitals reviewed.   ED Course  Procedures (including critical care time)  EMERGENCY DEPARTMENT BILIARY ULTRASOUND INTERPRETATION "Study: Limited Abdominal Ultrasound of the gallbladder and common bile  duct."  INDICATIONS: Abdominal pain Indication: Multiple views of the gallbladder and common bile duct were obtained in real-time with a Multi-frequency probe." PERFORMED BY:  Myself IMAGES ARCHIVED?: Yes FINDINGS: Gallstones absent, Gallbladder wall normal in thickness and Sonographic Murphy's sign absent LIMITATIONS: Bowel Gas INTERPRETATION: Normal  CPT Code [phone] (limited abdominal)    DIAGNOSTIC STUDIES: Oxygen Saturation is 100% on RA, normal by my interpretation.    COORDINATION OF CARE: 11:57 PM Will administer Zofran injection. Discussed treatment plan with pt at bedside and pt agreed to plan.   Labs Review Labs Reviewed  CBC WITH DIFFERENTIAL/PLATELET - Abnormal; Notable for the following:    WBC 24.4 (*)    Neutro Abs 21.1 (*)    Monocytes Absolute 2.0 (*)    All other components within normal limits  COMPREHENSIVE METABOLIC PANEL - Abnormal; Notable for the following:    Glucose, Bld 143 (*)    Calcium 8.5 (*)    All other components  within normal limits  URINALYSIS, ROUTINE W REFLEX MICROSCOPIC (NOT AT [facility]) - Abnormal; Notable for the following:    Color, Urine STRAW (*)    Specific Gravity, Urine <1.005 (*)    All other components within normal limits  LIPASE, BLOOD  LACTIC ACID, PLASMA  INFLUENZA PANEL BY PCR (TYPE A & B, H1N1)    Imaging Review Dg Chest 2 View  05/21/2015  CLINICAL DATA:  Acute onset of periumbilical abdominal pain, nausea, diarrhea and vomiting. Initial encounter. EXAM: CHEST  2 VIEW COMPARISON:  Chest radiograph performed 10/26/2012 FINDINGS: The lungs are well-aerated. Minimal bibasilar atelectasis is noted. There is no evidence of pleural effusion or pneumothorax. The heart is normal in size; the mediastinal contour is within normal limits. No acute osseous abnormalities are seen. IMPRESSION: Minimal bibasilar atelectasis noted.  Lungs otherwise clear. Electronically Signed   By: Garald Balding M.D.   On: 05/21/2015 01:39   Ct  Abdomen Pelvis W Contrast  05/21/2015  CLINICAL DATA:  Sharp constant umbilical pain for 1 week. Nausea, vomiting and diarrhea beginning today. History of orchidectomy for testicular cancer. Recent weight loss. EXAM: CT ABDOMEN AND PELVIS WITH CONTRAST TECHNIQUE: Multidetector CT imaging of the abdomen and pelvis was performed using the standard protocol following bolus administration of intravenous contrast. CONTRAST:  149mL OMNIPAQUE IOHEXOL 300 MG/ML  SOLN COMPARISON:  None. FINDINGS: LUNG BASES: Included view of the lung bases are clear. Heart size is normal, mild Coronary artery calcifications. No pericardial effusions. Mild gas distended esophagus can be seen with reflux. SOLID ORGANS: The liver, spleen, gallbladder, pancreas and adrenal glands are unremarkable. GASTROINTESTINAL TRACT: The stomach, small and large bowel are normal in course and caliber without inflammatory changes. Mild descending and sigmoid colonic diverticulosis. Mild amount of retained large bowel stool. Normal appendix. KIDNEYS/ URINARY TRACT: Kidneys are orthotopic, demonstrating symmetric enhancement. No nephrolithiasis, hydronephrosis or solid renal masses. At least 3 renal cysts on the RIGHT measure up to 17 mm. At least 2 renal cysts on the LEFT measure up to 13 mm. Too small to characterize hypodensities in the kidneys bilaterally. Faint early excretion of contrast decreases sensitivity for small nonobstructing nephrolithiasis. The unopacified ureters are normal in course and caliber. Delayed imaging through the kidneys demonstrates symmetric prompt contrast excretion within the proximal urinary collecting system. Urinary bladder is well distended and nonsuspicious, urachal remnant. PERITONEUM/RETROPERITONEUM: Aortoiliac vessels are normal in course and caliber, moderate to severe calcific atherosclerosis. No lymphadenopathy by CT size criteria. Prostate size is normal. No intraperitoneal free fluid nor free air. SOFT TISSUE/OSSEOUS  STRUCTURES: Non-suspicious. No umbilical hernia. Moderate L2-3 degenerative disc with Schmorl's nodes. IMPRESSION: No acute intra-abdominal or pelvic process. Mild colonic diverticulosis. Electronically Signed   By: Elon Alas M.D.   On: 05/21/2015 02:14   I have personally reviewed and evaluated these images and lab results as part of my medical decision-making.   EKG Interpretation None      MDM   Final diagnoses:  Leukocytosis  Cough  Upper abdominal pain   I personally performed the services described in this documentation, which was scribed in my presence. The recorded information has been reviewed and is accurate.  Patient presents with recurrent upper abdominal pain and respiratory symptoms. Concern for H. pylori/reflux versus gallbladder versus less likely ischemic bowel. Patient has had weight loss and recurrent upper abdominal symptoms. Plan for blood work and further evaluation with CT scan if kidney function is okay.  Patient improved significantly on reexamination. No abdominal pain no  vomiting no fever. Tolerating oral fluids. Patient has white blood cell count elevation no definitive source at this time however patient does have productive cough. Plan for doxycycline and follow-up influenza test and reassessment on Monday.  Results and differential diagnosis were discussed with the patient/parent/guardian. Xrays were independently reviewed by myself.  Close follow up outpatient was discussed, comfortable with the plan.   Medications  ondansetron (ZOFRAN) injection 4 mg (not administered)  fentaNYL (SUBLIMAZE) injection 50 mcg (50 mcg Intravenous Given 05/21/15 0003)  ondansetron (ZOFRAN) injection 4 mg (4 mg Intravenous Given 05/20/15 2356)  sodium chloride 0.9 % bolus 1,000 mL (0 mLs Intravenous Stopped 05/21/15 0131)  iohexol (OMNIPAQUE) 300 MG/ML solution 100 mL (100 mLs Intravenous Contrast Given 05/21/15 0100)  famotidine (PEPCID) tablet 20 mg (20 mg Oral Given  05/21/15 0125)    Filed Vitals:   05/20/15 2331 05/21/15 0229  BP: 169/84 136/73  Pulse: 91 86  Temp: 98 F (36.7 C) 98.7 F (37.1 C)  TempSrc: Oral Oral  Resp: 18 18  Height: 5\' 8"  (1.727 m)   Weight: 143 lb (64.864 kg)   SpO2: 100% 97%    Final diagnoses:  Leukocytosis  Cough  Upper abdominal pain        [name], MD 05/21/15 [id number]

## 2015-05-21 ENCOUNTER — Emergency Department (HOSPITAL_COMMUNITY): Payer: Medicare Other

## 2015-05-21 LAB — CBC WITH DIFFERENTIAL/PLATELET
BASOS PCT: 0 %
Basophils Absolute: 0.1 10*3/uL (ref 0.0–0.1)
EOS ABS: 0.2 10*3/uL (ref 0.0–0.7)
EOS PCT: 1 %
HCT: 42.1 % (ref 39.0–52.0)
HEMOGLOBIN: 14.1 g/dL (ref 13.0–17.0)
LYMPHS ABS: 1.1 10*3/uL (ref 0.7–4.0)
Lymphocytes Relative: 5 %
MCH: 29.5 pg (ref 26.0–34.0)
MCHC: 33.5 g/dL (ref 30.0–36.0)
MCV: 88.1 fL (ref 78.0–100.0)
Monocytes Absolute: 2 10*3/uL — ABNORMAL HIGH (ref 0.1–1.0)
Monocytes Relative: 8 %
NEUTROS PCT: 86 %
Neutro Abs: 21.1 10*3/uL — ABNORMAL HIGH (ref 1.7–7.7)
PLATELETS: 267 10*3/uL (ref 150–400)
RBC: 4.78 MIL/uL (ref 4.22–5.81)
RDW: 13.7 % (ref 11.5–15.5)
WBC: 24.4 10*3/uL — AB (ref 4.0–10.5)

## 2015-05-21 LAB — URINALYSIS, ROUTINE W REFLEX MICROSCOPIC
Bilirubin Urine: NEGATIVE
Glucose, UA: NEGATIVE mg/dL
Hgb urine dipstick: NEGATIVE
Ketones, ur: NEGATIVE mg/dL
LEUKOCYTES UA: NEGATIVE
Nitrite: NEGATIVE
PROTEIN: NEGATIVE mg/dL
pH: 6.5 (ref 5.0–8.0)

## 2015-05-21 LAB — COMPREHENSIVE METABOLIC PANEL
ALBUMIN: 4.1 g/dL (ref 3.5–5.0)
ALK PHOS: 75 U/L (ref 38–126)
ALT: 30 U/L (ref 17–63)
ANION GAP: 8 (ref 5–15)
AST: 21 U/L (ref 15–41)
BUN: 14 mg/dL (ref 6–20)
CHLORIDE: 104 mmol/L (ref 101–111)
CO2: 26 mmol/L (ref 22–32)
Calcium: 8.5 mg/dL — ABNORMAL LOW (ref 8.9–10.3)
Creatinine, Ser: 0.81 mg/dL (ref 0.61–1.24)
GFR calc non Af Amer: >60 mL/min (ref >60)
GLUCOSE: 143 mg/dL — AB (ref 65–99)
Potassium: 3.9 mmol/L (ref 3.5–5.1)
SODIUM: 138 mmol/L (ref 135–145)
Total Bilirubin: 0.6 mg/dL (ref 0.3–1.2)
Total Protein: 7.4 g/dL (ref 6.5–8.1)

## 2015-05-21 LAB — INFLUENZA PANEL BY PCR (TYPE A & B)
H1N1FLUPCR: NOT DETECTED
INFLAPCR: NEGATIVE
INFLBPCR: NEGATIVE

## 2015-05-21 LAB — LACTIC ACID, PLASMA: LACTIC ACID, VENOUS: 1.3 mmol/L (ref 0.5–2.0)

## 2015-05-21 LAB — LIPASE, BLOOD: Lipase: 27 U/L (ref 11–51)

## 2015-05-21 MED ORDER — DOXYCYCLINE HYCLATE 100 MG PO CAPS: 100.0000 mg | ORAL_CAPSULE | Freq: Two times a day (BID) | ORAL | Status: DC

## 2015-05-21 MED ORDER — FAMOTIDINE 20 MG PO TABS
20.0000 mg | ORAL_TABLET | Freq: Once | ORAL | Status: AC
  Administered 2015-05-21: 20 mg via ORAL
  Filled 2015-05-21: qty 1

## 2015-05-21 MED ORDER — ONDANSETRON 4 MG PO TBDP: ORAL_TABLET | ORAL | Status: DC

## 2015-05-21 MED ORDER — IOHEXOL 300 MG/ML  SOLN
100.0000 mL | Freq: Once | INTRAMUSCULAR | Status: AC | PRN
  Administered 2015-05-21: 100 mL via INTRAVENOUS

## 2015-05-21 NOTE — ED Notes (Signed)
Pt given ginger ale, tolerating well,

## 2015-05-21 NOTE — ED Notes (Signed)
Pt and family updated on plan of care,  

## 2015-05-21 NOTE — ED Notes (Signed)
Pt and family updated on plan of care, reports that he is feeling better,

## 2015-05-21 NOTE — Discharge Instructions (Signed)
Take antibiotics as directed. See your doctor for reassessment on Monday.  If you were given medicines take as directed.  If you are on coumadin or contraceptives realize their levels and effectiveness is altered by many different medicines.  If you have any reaction (rash, tongues swelling, other) to the medicines stop taking and see a physician.    If your blood pressure was elevated in the ER make sure you follow up for management with a primary doctor or return for chest pain, shortness of breath or stroke symptoms.  Please follow up as directed and return to the ER or see a physician for new or worsening symptoms.  Thank you. Filed Vitals:   05/20/15 2331 05/21/15 0229  BP: 169/84 136/73  Pulse: 91 86  Temp: 98 F (36.7 C) 98.7 F (37.1 C)  TempSrc: Oral Oral  Resp: 18 18  Height: 5\' 8"  (1.727 m)   Weight: 143 lb (64.864 kg)   SpO2: 100% 97%

## 2015-05-21 NOTE — ED Notes (Signed)
Dr. Zavitz at bedside,  

## 2015-06-08 ENCOUNTER — Telehealth (INDEPENDENT_AMBULATORY_CARE_PROVIDER_SITE_OTHER): Payer: Self-pay | Admitting: Internal Medicine

## 2015-06-08 NOTE — Telephone Encounter (Signed)
We will address with Dr.Rehman.

## 2015-06-08 NOTE — Telephone Encounter (Signed)
[name] stopped by the office due to being recently seen in the ER at [facility] and he wants to make an appointment with [name] only to follow-up. He's not been seen in the office since 2014 but said [name] suggested he follow-up with a GI provider. He mentioned having a history of cancer and so he was nervous and scared about having to go to the ER due to having stomach problems. He wants [name] to review the notes from his most recent ER visit and let him know if he can be worked in soon.  Pt's ph# [phone]  Thank you.

## 2015-06-08 NOTE — Telephone Encounter (Signed)
Patient may see [name] tomorrow. [name] can review ED notes .

## 2015-06-09 ENCOUNTER — Encounter (INDEPENDENT_AMBULATORY_CARE_PROVIDER_SITE_OTHER): Payer: Self-pay | Admitting: *Deleted

## 2015-06-09 ENCOUNTER — Ambulatory Visit (INDEPENDENT_AMBULATORY_CARE_PROVIDER_SITE_OTHER): Payer: Medicare Other | Admitting: Internal Medicine

## 2015-06-09 ENCOUNTER — Other Ambulatory Visit (INDEPENDENT_AMBULATORY_CARE_PROVIDER_SITE_OTHER): Payer: Self-pay | Admitting: Internal Medicine

## 2015-06-09 ENCOUNTER — Encounter (INDEPENDENT_AMBULATORY_CARE_PROVIDER_SITE_OTHER): Payer: Self-pay | Admitting: Internal Medicine

## 2015-06-09 VITALS — BP 178/80 | HR 64 | Temp 97.9°F | Ht 68.0 in | Wt 145.6 lb

## 2015-06-09 DIAGNOSIS — K219 Gastro-esophageal reflux disease without esophagitis: Secondary | ICD-10-CM | POA: Diagnosis not present

## 2015-06-09 DIAGNOSIS — R1013 Epigastric pain: Secondary | ICD-10-CM

## 2015-06-09 MED ORDER — PANTOPRAZOLE SODIUM 40 MG PO TBEC: 40.0000 mg | DELAYED_RELEASE_TABLET | Freq: Every day | ORAL | Status: DC | PRN

## 2015-06-09 NOTE — Telephone Encounter (Signed)
Mr. Ansley is scheduled to come in today and see Terri Setzer. Thank you.

## 2015-06-09 NOTE — Progress Notes (Signed)
Subjective:    Patient ID: [name], male    DOB: 10-Sep-1945, 70 y.o.   MRN: [id number]  HPI Presented to the ED earlier this month (05/20/2015)  with epigastric pain. He says his symptoms started in November He says he noticed some swelling in chest. Thyroid was checked and it was elevated. He was started on thyroid medication.  He says he opened the pill and took it. He has since stopped this. He saw Dr. Woody Parker for a headache head cold and chest cold  6 weeks ago.  He was given an antibiotic. Since then he has been having epigastric pain. He is not taking any Protonix. When he had the cold he says he lost about 6 pounds. No dysphagia.  His appetite is better. He has gained some weight back.  There is no nausea or vomiting now.  He usually has a BM daily. No melena or BRRB. No diarrhea.  No fever.  He says he feels 75% better. Last EGD was 05/19/2008 by Dr. Laural Parker and was normal.  ASA 81mg  daily.     05/20/2015 CT abdomen/pelvis with CM.     CLINICAL DATA: Sharp constant umbilical pain for 1 week. Nausea, vomiting and diarrhea beginning today. History of orchidectomy for testicular cancer. Recent weight loss.  IMPRESSION: No acute intra-abdominal or pelvic process.  Mild colonic diverticulosis.  Hepatic Function Panel     Component Value Date/Time   PROT 7.4 05/20/2015 2345   PROT 7.0 06/17/2014 0938   ALBUMIN 4.1 05/20/2015 2345   ALBUMIN 4.0 06/17/2014 0938   AST 21 05/20/2015 2345   ALT 30 05/20/2015 2345   ALKPHOS 75 05/20/2015 2345   BILITOT 0.6 05/20/2015 2345   BILITOT 0.4 06/17/2014 0938   BILIDIR 0.09 06/17/2014 [id number]       12/06/2010 Colonoscopy  Indications: History of colonic polyps. Last exam was in February 2007.  Impression:  Examination performed to cecum. 2 small polyps ablated via cold biopsy from proximal transverse colon and submitted in one container. External hemorrhoids.  AM Patient called at home and on cell phone but no  answer. Message/result left on his cell phone. Both polyps are adenomatous. Next colonoscopy in 5 years. Copy to PCP.    12/01/2003 Esophagogastroduodenoscopy.  INDICATIONS: [name] is a 70 year old Caucasian male with recurrent epigastric pain. He has been seen in the office twice this year. He had normal LFTs and upper abdominal ultrasound. He has been treated with PPI and antispasmodic with questionable results. Lately, he has been having more pain and soreness and bloating. He therefore undergoing diagnostic EGD. Procedure risks were reviewed with the patient and informed consent was obtained.  FINAL DIAGNOSIS: Focal erythema at gastric body, otherwise normal examination. 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 Please note his helicobacter pylori serology was negative in October 2003 and will not be     Review of Systems Past Medical History  Diagnosis Date  . Anxiety   . Hyperlipidemia   . H. pylori infection   . GERD (gastroesophageal reflux disease)   . Personal history of colonic polyps 2 9 2007  . Palpitations     event monior 04/11/05- SR with occ PAC  . Chest pain     myoview 07/09/12-normal, nl ef; echo 04/12/05-ef>55%, mild aortic sclerosis  . Headache   . Cancer ([name])     testicular   . Hypertension   . Hypercholesteremia     Past Surgical History  Procedure Laterality Date  . Back surgery  [id number], 2 ruptured dics  2007    2 ruptured discs  . Colonoscopy  12/06/2010    Procedure: COLONOSCOPY;  Surgeon: Rogene Houston, MD;  Location: [facility];  Service: Endoscopy;  Laterality: N/A;  . Cataract extraction w/phaco Left 12/08/2012    Procedure: CATARACT EXTRACTION PHACO AND INTRAOCULAR LENS PLACEMENT (IOC);  Surgeon: Tonny Branch, MD;  Location: [facility];  Service: Ophthalmology;  Laterality: Left;  CDE:  23.19  . Cataract extraction w/phaco Right 04/15/2014    Procedure: CATARACT EXTRACTION PHACO AND INTRAOCULAR LENS PLACEMENT RIGHT;  Surgeon:  Tonny Branch, MD;  Location: [facility];  Service: Ophthalmology;  Laterality: Right;  CDE:10.32  . Eye surgery      Allergies  Allergen Reactions  . Lisinopril Swelling    H/o of angioedema   . Chlorthalidone Other (See Comments)    Causes severe hyponatremia  . Diazepam     REACTION: hyperactivity  . Hyoscyamine Swelling  . Lansoprazole     REACTION: oral swelling  . Prevacid [Lansoprazole] Swelling  . Valium [Diazepam]     "makes me feel crazy"    Current Outpatient Prescriptions on File Prior to Visit  Medication Sig Dispense Refill  . amLODipine (NORVASC) 5 MG tablet Take 1 tablet (5 mg total) by mouth daily. 90 tablet 3  . aspirin 81 MG tablet Take 81 mg by mouth every other day.     Marland Kitchen atorvastatin (LIPITOR) 10 MG tablet Take 10 mg by mouth daily.    . ondansetron (ZOFRAN ODT) 4 MG disintegrating tablet 4mg  ODT q4 hours prn nausea/vomit (Patient not taking: Reported on 06/09/2015) 4 tablet 0  . pantoprazole (PROTONIX) 40 MG tablet Take 40 mg by mouth daily as needed (acid reflux). Reported on 06/09/2015     No current facility-administered medications on file prior to visit.        Objective:   Physical Exam Blood pressure 178/80, pulse 64, temperature 97.9 F (36.6 C), height 5\' 8"  (1.727 m), weight 145 lb 9.6 oz (66.044 kg).  Alert and oriented. Skin warm and dry. Oral mucosa is moist.   . Sclera anicteric, conjunctivae is pink. Thyroid not enlarged. No cervical lymphadenopathy. Lungs clear. Heart regular rate and rhythm.  Abdomen is soft. Bowel sounds are positive. No hepatomegaly. No abdominal masses felt. No tenderness.  No edema to lower extremities.        Assessment & Plan:  Epigastric pain. ? PUD. EGD. The risks and benefits such as perforation, bleeding, and infection were reviewed with the patient and is agreeable. Liver numbers are normal.  Rx for Protonix.

## 2015-06-09 NOTE — Patient Instructions (Signed)
Take Protonix 30 minutes before Breakfast.  EGD. The risks and benefits such as perforation, bleeding, and infection were reviewed with the patient and is agreeable.

## 2015-07-20 ENCOUNTER — Ambulatory Visit (HOSPITAL_COMMUNITY)
Admission: RE | Admit: 2015-07-20 | Discharge: 2015-07-20 | Disposition: A | Discharge status: Home / Self Care | Payer: Medicare Other | Source: Ambulatory Visit | Attending: Internal Medicine | Admitting: Internal Medicine

## 2015-07-20 ENCOUNTER — Encounter (HOSPITAL_COMMUNITY): Payer: Self-pay | Admitting: *Deleted

## 2015-07-20 ENCOUNTER — Encounter (HOSPITAL_COMMUNITY)
Admission: RE | Disposition: A | Discharge status: Home / Self Care | Payer: Self-pay | Source: Ambulatory Visit | Attending: Internal Medicine

## 2015-07-20 DIAGNOSIS — F419 Anxiety disorder, unspecified: Secondary | ICD-10-CM | POA: Insufficient documentation

## 2015-07-20 DIAGNOSIS — E785 Hyperlipidemia, unspecified: Secondary | ICD-10-CM | POA: Diagnosis not present

## 2015-07-20 DIAGNOSIS — R1013 Epigastric pain: Secondary | ICD-10-CM

## 2015-07-20 DIAGNOSIS — K2950 Unspecified chronic gastritis without bleeding: Secondary | ICD-10-CM | POA: Diagnosis not present

## 2015-07-20 DIAGNOSIS — E7800 Pure hypercholesterolemia, unspecified: Secondary | ICD-10-CM | POA: Diagnosis not present

## 2015-07-20 DIAGNOSIS — Z8547 Personal history of malignant neoplasm of testis: Secondary | ICD-10-CM | POA: Insufficient documentation

## 2015-07-20 DIAGNOSIS — I10 Essential (primary) hypertension: Secondary | ICD-10-CM | POA: Insufficient documentation

## 2015-07-20 DIAGNOSIS — Z7982 Long term (current) use of aspirin: Secondary | ICD-10-CM | POA: Insufficient documentation

## 2015-07-20 DIAGNOSIS — K2960 Other gastritis without bleeding: Secondary | ICD-10-CM | POA: Diagnosis not present

## 2015-07-20 DIAGNOSIS — Z79899 Other long term (current) drug therapy: Secondary | ICD-10-CM | POA: Diagnosis not present

## 2015-07-20 DIAGNOSIS — K219 Gastro-esophageal reflux disease without esophagitis: Secondary | ICD-10-CM | POA: Diagnosis not present

## 2015-07-20 HISTORY — PX: ESOPHAGOGASTRODUODENOSCOPY: SHX5428

## 2015-07-20 SURGERY — EGD (ESOPHAGOGASTRODUODENOSCOPY)
Anesthesia: Moderate Sedation

## 2015-07-20 MED ORDER — MEPERIDINE HCL 50 MG/ML IJ SOLN
INTRAMUSCULAR | Status: DC | PRN
  Administered 2015-07-20 (×2): 25 mg via INTRAVENOUS

## 2015-07-20 MED ORDER — STERILE WATER FOR IRRIGATION IR SOLN
Status: DC | PRN
  Administered 2015-07-20: 2.5 mL

## 2015-07-20 MED ORDER — MIDAZOLAM HCL 5 MG/5ML IJ SOLN
INTRAMUSCULAR | Status: AC
  Filled 2015-07-20: qty 10

## 2015-07-20 MED ORDER — SODIUM CHLORIDE 0.9 % IV SOLN
INTRAVENOUS | Status: DC
  Administered 2015-07-20: 13:00:00 via INTRAVENOUS

## 2015-07-20 MED ORDER — BUTAMBEN-TETRACAINE-BENZOCAINE 2-2-14 % EX AERO
INHALATION_SPRAY | CUTANEOUS | Status: DC | PRN
  Administered 2015-07-20: 2 via TOPICAL

## 2015-07-20 MED ORDER — MEPERIDINE HCL 50 MG/ML IJ SOLN
INTRAMUSCULAR | Status: AC
  Filled 2015-07-20: qty 1

## 2015-07-20 MED ORDER — MIDAZOLAM HCL 5 MG/5ML IJ SOLN
INTRAMUSCULAR | Status: DC | PRN
  Administered 2015-07-20: 2 mg via INTRAVENOUS
  Administered 2015-07-20: 3 mg via INTRAVENOUS
  Administered 2015-07-20: 2 mg via INTRAVENOUS

## 2015-07-20 NOTE — H&P (Signed)
[name] is an 70 y.o. male.   Chief Complaint: Patient is here for EGD. HPI: Patient is 70 year old Caucasian male with multiple medical problems has been having intermittent epigastric pain since he had acute illness or possibly fluid or food poisoning 2 months ago. He was seen in emergency room and LFTs are normal. He does have leukocytosis. Abdominal pelvic CT did not reveal any abnormality to account for his symptoms. He has been taking pantoprazole feels some better. He did have nausea and vomiting to begin with but not since then. He states he lost his appetite and lost 8 pounds. His appetite is back. He denies melena or rectal bleeding with diarrhea. He also did not experience hematemesis. He was treated for H. pylori infection 7 years ago  Past Medical History  Diagnosis Date  . Anxiety   . Hyperlipidemia   . H. pylori infection   . GERD (gastroesophageal reflux disease)   . Personal history of colonic polyps 2 9 2007  . Palpitations     event monior 04/11/05- SR with occ PAC  . Chest pain     myoview 07/09/12-normal, nl ef; echo 04/12/05-ef>55%, mild aortic sclerosis  . Headache   . Cancer ([place])     testicular   . Hypertension   . Hypercholesteremia     Past Surgical History  Procedure Laterality Date  . Back surgery  in2007, 2 ruptured dics  2007    2 ruptured discs  . Colonoscopy  12/06/2010    Procedure: COLONOSCOPY;  Surgeon: Rogene Houston, MD;  Location: [facility];  Service: Endoscopy;  Laterality: N/A;  . Cataract extraction w/phaco Left 12/08/2012    Procedure: CATARACT EXTRACTION PHACO AND INTRAOCULAR LENS PLACEMENT (IOC);  Surgeon: Tonny Branch, MD;  Location: [facility];  Service: Ophthalmology;  Laterality: Left;  CDE:  23.19  . Cataract extraction w/phaco Right 04/15/2014    Procedure: CATARACT EXTRACTION PHACO AND INTRAOCULAR LENS PLACEMENT RIGHT;  Surgeon: Tonny Branch, MD;  Location: [facility];  Service: Ophthalmology;  Laterality: Right;  CDE:10.32  . Eye surgery       History reviewed. No pertinent family history. Social History:  reports that he has never smoked. He does not have any smokeless tobacco history on file. He reports that he does not drink alcohol or use illicit drugs.  Allergies:  Allergies  Allergen Reactions  . Lisinopril Swelling    H/o of angioedema   . Chlorthalidone Other (See Comments)    Causes severe hyponatremia  . Diazepam     REACTION: hyperactivity  . Hyoscyamine Swelling  . Lansoprazole     REACTION: oral swelling  . Prevacid [Lansoprazole] Swelling  . Valium [Diazepam]     "makes me feel crazy"    Medications Prior to Admission  Medication Sig Dispense Refill  . amLODipine (NORVASC) 5 MG tablet Take 1 tablet (5 mg total) by mouth daily. 90 tablet 3  . aspirin 81 MG tablet Take 81 mg by mouth every other day.     Marland Kitchen atorvastatin (LIPITOR) 10 MG tablet Take 10 mg by mouth daily.    . pantoprazole (PROTONIX) 40 MG tablet Take 1 tablet (40 mg total) by mouth daily as needed (acid reflux). Reported on 06/09/2015 30 tablet 3    No results found for this or any previous visit (from the past 48 hour(s)). No results found.  ROS  Blood pressure 159/85, pulse 94, temperature 98.3 F (36.8 C), temperature source Oral, resp. rate 20, height 5\' 8"  (  1.727 m), weight 150 lb (68.04 kg), SpO2 99 %. Physical Exam  Constitutional: He appears well-developed and well-nourished.  HENT:  Mouth/Throat: Oropharynx is clear and moist.  Eyes: Conjunctivae are normal. No scleral icterus.  Neck: No thyromegaly present.  Cardiovascular: Normal rate, regular rhythm and normal heart sounds.   No murmur heard. Respiratory: Effort normal and breath sounds normal.  GI: Soft. He exhibits no distension and no mass. There is no tenderness.  Musculoskeletal: He exhibits no edema.  Lymphadenopathy:    He has no cervical adenopathy.  Neurological: He is alert.  Skin: Skin is warm and dry.     Assessment/Plan Recurrent epigastric  pain. Diagnostic EGD.  Rogene Houston, MD 07/20/2015, 2:16 PM

## 2015-07-20 NOTE — Discharge Instructions (Signed)
Resume aspirin on 07/22/2015 and other medications as before. Resume usual diet. No driving for 24 hours. Physician will call with biopsy results.       Esophagogastroduodenoscopy, Care After Refer to this sheet in the next few weeks. These instructions provide you with information about caring for yourself after your procedure. Your health care provider may also give you more specific instructions. Your treatment has been planned according to current medical practices, but problems sometimes occur. Call your health care provider if you have any problems or questions after your procedure. WHAT TO EXPECT AFTER THE PROCEDURE After your procedure, it is typical to feel:  Soreness in your throat.  Pain with swallowing.  Sick to your stomach (nauseous).  Bloated.  Dizzy.  Fatigued. HOME CARE INSTRUCTIONS  Do not eat or drink anything until the numbing medicine (local anesthetic) has worn off and your gag reflex has returned. You will know that the local anesthetic has worn off when you can swallow comfortably.  Do not drive or operate machinery until directed by your health care provider.  Take medicines only as directed by your health care provider. SEEK MEDICAL CARE IF:   You cannot stop coughing.  You are not urinating at all or less than usual. SEEK IMMEDIATE MEDICAL CARE IF:  You have difficulty swallowing.  You cannot eat or drink.  You have worsening throat or chest pain.  You have dizziness or lightheadedness or you faint.  You have nausea or vomiting.  You have chills.  You have a fever.  You have severe abdominal pain.  You have black, tarry, or bloody stools.   This information is not intended to replace advice given to you by your health care provider. Make sure you discuss any questions you have with your health care provider.   Document Released: 02/20/2012 Document Revised: 03/26/2014 Document Reviewed: 02/20/2012 Elsevier Interactive Patient  Education [employer].

## 2015-07-20 NOTE — Op Note (Signed)
[facility] Patient Name: [name] Procedure Date: 07/20/2015 2:12 PM MRN: [id number] Date of Birth: Jan 31, 1946 Attending MD: Hildred Laser , MD CSN: [id number] Age: 70 Admit Type: Outpatient Procedure:                Upper GI endoscopy Indications:              Epigastric abdominal pain, Failure to respond to                            medical treatment Providers:                Hildred Laser, MD, Lurline Del, RN, Isabella Stalling,                            Technician Referring MD:             Glenda Chroman, MD Medicines:                Cetacaine spray, Meperidine 50 mg IV, Midazolam 7                            mg IV Complications:            No immediate complications. Estimated Blood Loss:     Estimated blood loss was minimal. Procedure:                Pre-Anesthesia Assessment:                           - Prior to the procedure, a History and Physical                            was performed, and patient medications and                            allergies were reviewed. The patient's tolerance of                            previous anesthesia was also reviewed. The risks                            and benefits of the procedure and the sedation                            options and risks were discussed with the patient.                            All questions were answered, and informed consent                            was obtained. Prior Anticoagulants: The patient                            last took aspirin 2 days prior to the procedure.  ASA Grade Assessment: II - A patient with mild                            systemic disease. After reviewing the risks and                            benefits, the patient was deemed in satisfactory                            condition to undergo the procedure.                           After obtaining informed consent, the endoscope was                            passed under direct vision. Throughout the                             procedure, the patient's blood pressure, pulse, and                            oxygen saturations were monitored continuously. The                            [id number]) scope was introduced through the                            mouth, and advanced to the second part of duodenum.                            The upper GI endoscopy was accomplished without                            difficulty. The patient tolerated the procedure                            well. Scope In: 2:30:07 PM Scope Out: 2:37:58 PM Total Procedure Duration: 0 hours 7 minutes 51 seconds  Findings:      The examined esophagus was normal.      Esophagogastric landmarks were identified: the gastroesophageal junction       was found at 40 cm from the incisors.      Diffuse mild inflammation characterized by congestion (edema), erythema       and granularity was found in the gastric body. Biopsies were taken with       a cold forceps for histology.      The exam of the stomach was otherwise normal.      The duodenal bulb and second portion of the duodenum were normal.       Estimated blood loss was minimal. Impression:               - Normal esophagus.                           - Esophagogastric landmarks identified.                           -  Non-erosive gastritis. Biopsied.                           - Normal duodenal bulb and second portion of the                            duodenum. Moderate Sedation:      Moderate (conscious) sedation was administered by the endoscopy nurse       and supervised by the endoscopist. The following parameters were       monitored: oxygen saturation, heart rate, blood pressure, CO2       capnography and response to care. Total physician intraservice time was       14 minutes. Recommendation:           - Patient has a contact number available for                            emergencies. The signs and symptoms of potential                             delayed complications were discussed with the                            patient. Return to normal activities tomorrow.                            Written discharge instructions were provided to the                            patient.                           - Resume previous diet today.                           - Continue present medications.                           - Resume aspirin at prior dose in 2 days.                           - Await pathology results. Procedure Code(s):        --- Professional ---                           [phone], Esophagogastroduodenoscopy, flexible,                            transoral; with biopsy, single or multiple                           99152, Moderate sedation services provided by the                            same physician or other qualified health care  professional performing the diagnostic or                            therapeutic service that the sedation supports,                            requiring the presence of an independent trained                            observer to assist in the monitoring of the                            patient's level of consciousness and physiological                            status; initial 15 minutes of intraservice time,                            patient age 30 years or older Diagnosis Code(s):        --- Professional ---                           K29.60, Other gastritis without bleeding                           R10.13, Epigastric pain CPT copyright 2016 American Medical Association. All rights reserved. The codes documented in this report are preliminary and upon coder review may  be revised to meet current compliance requirements. Hildred Laser, MD Hildred Laser, MD 07/20/2015 2:50:17 PM This report has been signed electronically. Number of Addenda: 0

## 2015-07-25 ENCOUNTER — Encounter (HOSPITAL_COMMUNITY): Payer: Self-pay | Admitting: Internal Medicine

## 2015-10-19 ENCOUNTER — Emergency Department (HOSPITAL_COMMUNITY)
Admission: EM | Admit: 2015-10-19 | Discharge: 2015-10-19 | Disposition: A | Discharge status: Home / Self Care | Payer: Medicare Other | Attending: Emergency Medicine | Admitting: Emergency Medicine

## 2015-10-19 ENCOUNTER — Encounter (HOSPITAL_COMMUNITY): Payer: Self-pay | Admitting: Emergency Medicine

## 2015-10-19 DIAGNOSIS — I10 Essential (primary) hypertension: Secondary | ICD-10-CM | POA: Diagnosis not present

## 2015-10-19 DIAGNOSIS — Z79899 Other long term (current) drug therapy: Secondary | ICD-10-CM | POA: Insufficient documentation

## 2015-10-19 DIAGNOSIS — Y999 Unspecified external cause status: Secondary | ICD-10-CM | POA: Diagnosis not present

## 2015-10-19 DIAGNOSIS — Y9389 Activity, other specified: Secondary | ICD-10-CM | POA: Insufficient documentation

## 2015-10-19 DIAGNOSIS — W228XXA Striking against or struck by other objects, initial encounter: Secondary | ICD-10-CM | POA: Insufficient documentation

## 2015-10-19 DIAGNOSIS — S0990XA Unspecified injury of head, initial encounter: Secondary | ICD-10-CM

## 2015-10-19 DIAGNOSIS — Z7982 Long term (current) use of aspirin: Secondary | ICD-10-CM | POA: Insufficient documentation

## 2015-10-19 DIAGNOSIS — S0093XA Contusion of unspecified part of head, initial encounter: Secondary | ICD-10-CM

## 2015-10-19 DIAGNOSIS — Y929 Unspecified place or not applicable: Secondary | ICD-10-CM | POA: Diagnosis not present

## 2015-10-19 DIAGNOSIS — S0083XA Contusion of other part of head, initial encounter: Secondary | ICD-10-CM | POA: Diagnosis not present
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
## 2015-10-19 NOTE — ED Notes (Signed)
Pt states he was working on a car 2 days ago and the hood came down on his head. Denies LOC or bleeding. States he did have some swelling and applied Ice to same. States has not taken any pain meds for same. Denies any nausea/vomiting or vision changes.

## 2015-11-14 ENCOUNTER — Encounter (HOSPITAL_COMMUNITY): Payer: Self-pay | Admitting: Emergency Medicine

## 2015-11-14 ENCOUNTER — Emergency Department (HOSPITAL_COMMUNITY)
Admission: EM | Admit: 2015-11-14 | Discharge: 2015-11-14 | Disposition: A | Discharge status: Home / Self Care | Payer: Medicare Other | Attending: Emergency Medicine | Admitting: Emergency Medicine

## 2015-11-14 ENCOUNTER — Emergency Department (HOSPITAL_COMMUNITY): Payer: Medicare Other

## 2015-11-14 DIAGNOSIS — Z79899 Other long term (current) drug therapy: Secondary | ICD-10-CM | POA: Diagnosis not present

## 2015-11-14 DIAGNOSIS — Z7982 Long term (current) use of aspirin: Secondary | ICD-10-CM | POA: Diagnosis not present

## 2015-11-14 DIAGNOSIS — R5383 Other fatigue: Secondary | ICD-10-CM | POA: Insufficient documentation

## 2015-11-14 DIAGNOSIS — Z8547 Personal history of malignant neoplasm of testis: Secondary | ICD-10-CM | POA: Diagnosis not present

## 2015-11-14 DIAGNOSIS — R51 Headache: Secondary | ICD-10-CM | POA: Diagnosis not present

## 2015-11-14 DIAGNOSIS — R531 Weakness: Secondary | ICD-10-CM | POA: Insufficient documentation

## 2015-11-14 DIAGNOSIS — I10 Essential (primary) hypertension: Secondary | ICD-10-CM | POA: Diagnosis not present

## 2015-11-14 LAB — CBC WITH DIFFERENTIAL/PLATELET
Basophils Absolute: 0.1 10*3/uL (ref 0.0–0.1)
Basophils Relative: 1 %
EOS ABS: 0.2 10*3/uL (ref 0.0–0.7)
EOS PCT: 3 %
HCT: 41.6 % (ref 39.0–52.0)
Hemoglobin: 13.5 g/dL (ref 13.0–17.0)
LYMPHS ABS: 1.7 10*3/uL (ref 0.7–4.0)
Lymphocytes Relative: 22 %
MCH: 29 pg (ref 26.0–34.0)
MCHC: 32.5 g/dL (ref 30.0–36.0)
MCV: 89.5 fL (ref 78.0–100.0)
MONO ABS: 0.7 10*3/uL (ref 0.1–1.0)
MONOS PCT: 9 %
NEUTROS PCT: 65 %
Neutro Abs: 5.2 10*3/uL (ref 1.7–7.7)
PLATELETS: 277 10*3/uL (ref 150–400)
RBC: 4.65 MIL/uL (ref 4.22–5.81)
RDW: 13.1 % (ref 11.5–15.5)
WBC: 7.9 10*3/uL (ref 4.0–10.5)

## 2015-11-14 LAB — URINALYSIS, ROUTINE W REFLEX MICROSCOPIC
Bilirubin Urine: NEGATIVE
Glucose, UA: NEGATIVE mg/dL
HGB URINE DIPSTICK: NEGATIVE
KETONES UR: NEGATIVE mg/dL
Leukocytes, UA: NEGATIVE
Nitrite: NEGATIVE
PROTEIN: NEGATIVE mg/dL
Specific Gravity, Urine: 1.015 (ref 1.005–1.030)
pH: 6 (ref 5.0–8.0)

## 2015-11-14 LAB — BASIC METABOLIC PANEL
ANION GAP: 7 (ref 5–15)
BUN: 8 mg/dL (ref 6–20)
CO2: 27 mmol/L (ref 22–32)
Calcium: 9.3 mg/dL (ref 8.9–10.3)
Chloride: 103 mmol/L (ref 101–111)
Creatinine, Ser: 0.82 mg/dL (ref 0.61–1.24)
GFR calc Af Amer: >60 mL/min (ref >60)
Glucose, Bld: 145 mg/dL — ABNORMAL HIGH (ref 65–99)
POTASSIUM: 4.2 mmol/L (ref 3.5–5.1)
SODIUM: 137 mmol/L (ref 135–145)

## 2015-11-14 LAB — TSH: TSH: 1.547 u[IU]/mL (ref 0.350–4.500)

## 2015-11-14 MED ORDER — SODIUM CHLORIDE 0.9 % IV BOLUS (SEPSIS)
500.0000 mL | Freq: Once | INTRAVENOUS | Status: AC
  Administered 2015-11-14: 500 mL via INTRAVENOUS
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
## 2015-11-14 NOTE — ED Notes (Signed)
Pt returned from ct. Nad.  

## 2015-11-14 NOTE — ED Notes (Signed)
Pt taken to ct. Nad.  

## 2015-11-14 NOTE — Discharge Instructions (Signed)
Follow-up with your family doctor next week for recheck. 

## 2015-11-14 NOTE — ED Triage Notes (Signed)
Pt reports "funny feeling in head" since Saturday with mild h/a.  Pt states this is the way he felt when his sodium has been low in the past.  Pt reports having increased drowsiness at times. Pt is ambulatory, alert and oriented. No weakness unilaterally at this time.

## 2015-11-15 LAB — T4: T4, Total: 6.5 ug/dL (ref 4.5–12.0)

## 2015-12-05 ENCOUNTER — Encounter (INDEPENDENT_AMBULATORY_CARE_PROVIDER_SITE_OTHER): Payer: Self-pay | Admitting: *Deleted

## 2015-12-15 ENCOUNTER — Other Ambulatory Visit (INDEPENDENT_AMBULATORY_CARE_PROVIDER_SITE_OTHER): Payer: Self-pay | Admitting: *Deleted

## 2015-12-15 DIAGNOSIS — Z86010 Personal history of colonic polyps: Secondary | ICD-10-CM | POA: Insufficient documentation

## 2016-01-20 ENCOUNTER — Encounter (INDEPENDENT_AMBULATORY_CARE_PROVIDER_SITE_OTHER): Payer: Self-pay | Admitting: *Deleted

## 2016-01-20 ENCOUNTER — Telehealth (INDEPENDENT_AMBULATORY_CARE_PROVIDER_SITE_OTHER): Payer: Self-pay | Admitting: *Deleted

## 2016-01-20 NOTE — Telephone Encounter (Signed)
Patient needs trilyte 

## 2016-01-23 MED ORDER — PEG 3350-KCL-NA BICARB-NACL 420 G PO SOLR: 4000.0000 mL | Freq: Once | ORAL | 0 refills | Status: AC

## 2016-02-13 ENCOUNTER — Telehealth (INDEPENDENT_AMBULATORY_CARE_PROVIDER_SITE_OTHER): Payer: Self-pay | Admitting: *Deleted

## 2016-02-13 NOTE — Telephone Encounter (Signed)
Referring MD/PCP: vyas   Procedure: tcs  Reason/Indication:  Hx polyps  Has patient had this procedure before?  Yes, 2012  If so, when, by whom and where?    Is there a family history of colon cancer?  no  Who?  What age when diagnosed?    Is patient diabetic?   no      Does patient have prosthetic heart valve or mechanical valve?  no  Do you have a pacemaker?  no  Has patient ever had endocarditis? no  Has patient had joint replacement within last 12 months?  no  Does patient tend to be constipated or take laxatives? no  Does patient have a history of alcohol/drug use?  no  Is patient on Coumadin, Plavix and/or Aspirin? yes  Medications: asa 81 mg daily, lipitor 10 mg daily, amlodipine 7.5 mg dailt  Allergies: see epic  Medication Adjustment: asa 2 days  Procedure date & time: 03/15/16 at 930

## 2016-02-14 NOTE — Telephone Encounter (Signed)
agree

## 2016-03-13 ENCOUNTER — Emergency Department (HOSPITAL_COMMUNITY): Payer: Medicare Other

## 2016-03-13 ENCOUNTER — Observation Stay (HOSPITAL_COMMUNITY)
Admission: EM | Admit: 2016-03-13 | Discharge: 2016-03-15 | Disposition: A | Discharge status: Home / Self Care | Payer: Medicare Other | Attending: Cardiovascular Disease | Admitting: Cardiovascular Disease

## 2016-03-13 ENCOUNTER — Encounter (HOSPITAL_COMMUNITY): Payer: Self-pay

## 2016-03-13 DIAGNOSIS — I4510 Unspecified right bundle-branch block: Secondary | ICD-10-CM | POA: Diagnosis not present

## 2016-03-13 DIAGNOSIS — K219 Gastro-esophageal reflux disease without esophagitis: Secondary | ICD-10-CM | POA: Insufficient documentation

## 2016-03-13 DIAGNOSIS — I480 Paroxysmal atrial fibrillation: Secondary | ICD-10-CM | POA: Diagnosis not present

## 2016-03-13 DIAGNOSIS — I10 Essential (primary) hypertension: Secondary | ICD-10-CM

## 2016-03-13 DIAGNOSIS — R002 Palpitations: Secondary | ICD-10-CM | POA: Insufficient documentation

## 2016-03-13 DIAGNOSIS — Z86010 Personal history of colonic polyps: Secondary | ICD-10-CM | POA: Insufficient documentation

## 2016-03-13 DIAGNOSIS — Z7982 Long term (current) use of aspirin: Secondary | ICD-10-CM | POA: Diagnosis not present

## 2016-03-13 DIAGNOSIS — Z888 Allergy status to other drugs, medicaments and biological substances status: Secondary | ICD-10-CM | POA: Diagnosis not present

## 2016-03-13 DIAGNOSIS — E7800 Pure hypercholesterolemia, unspecified: Secondary | ICD-10-CM | POA: Diagnosis not present

## 2016-03-13 DIAGNOSIS — I4891 Unspecified atrial fibrillation: Secondary | ICD-10-CM | POA: Diagnosis not present

## 2016-03-13 DIAGNOSIS — Z7901 Long term (current) use of anticoagulants: Secondary | ICD-10-CM | POA: Insufficient documentation

## 2016-03-13 DIAGNOSIS — F419 Anxiety disorder, unspecified: Secondary | ICD-10-CM | POA: Diagnosis not present

## 2016-03-13 DIAGNOSIS — E876 Hypokalemia: Secondary | ICD-10-CM | POA: Diagnosis not present

## 2016-03-13 LAB — CBC
HEMATOCRIT: 43.7 % (ref 39.0–52.0)
Hemoglobin: 14.8 g/dL (ref 13.0–17.0)
MCH: 29.9 pg (ref 26.0–34.0)
MCHC: 33.9 g/dL (ref 30.0–36.0)
MCV: 88.3 fL (ref 78.0–100.0)
PLATELETS: 292 10*3/uL (ref 150–400)
RBC: 4.95 MIL/uL (ref 4.22–5.81)
RDW: 13.1 % (ref 11.5–15.5)
WBC: 9.5 10*3/uL (ref 4.0–10.5)

## 2016-03-13 LAB — BASIC METABOLIC PANEL
Anion gap: 12 (ref 5–15)
BUN: 8 mg/dL (ref 6–20)
CHLORIDE: 100 mmol/L — AB (ref 101–111)
CO2: 24 mmol/L (ref 22–32)
Calcium: 9.5 mg/dL (ref 8.9–10.3)
Creatinine, Ser: 0.87 mg/dL (ref 0.61–1.24)
Glucose, Bld: 250 mg/dL — ABNORMAL HIGH (ref 65–99)
POTASSIUM: 3.3 mmol/L — AB (ref 3.5–5.1)
SODIUM: 136 mmol/L (ref 135–145)

## 2016-03-13 LAB — MAGNESIUM: MAGNESIUM: 2 mg/dL (ref 1.7–2.4)

## 2016-03-13 LAB — I-STAT TROPONIN, ED: Troponin i, poc: 0 ng/mL (ref 0.00–0.08)

## 2016-03-13 LAB — MRSA PCR SCREENING: MRSA by PCR: NEGATIVE

## 2016-03-13 MED ORDER — ACETAMINOPHEN 325 MG PO TABS: 650.0000 mg | ORAL_TABLET | ORAL | Status: DC | PRN

## 2016-03-13 MED ORDER — HEPARIN BOLUS VIA INFUSION
3500.0000 [IU] | Freq: Once | INTRAVENOUS | Status: AC
  Administered 2016-03-13: 3500 [IU] via INTRAVENOUS
  Filled 2016-03-13: qty 3500

## 2016-03-13 MED ORDER — DILTIAZEM LOAD VIA INFUSION
10.0000 mg | Freq: Once | INTRAVENOUS | Status: AC
  Administered 2016-03-13: 10 mg via INTRAVENOUS
  Filled 2016-03-13: qty 10

## 2016-03-13 MED ORDER — ASPIRIN EC 81 MG PO TBEC
81.0000 mg | DELAYED_RELEASE_TABLET | ORAL | Status: DC
  Administered 2016-03-13 – 2016-03-15 (×3): 81 mg via ORAL
  Filled 2016-03-13 (×3): qty 1

## 2016-03-13 MED ORDER — HEPARIN (PORCINE) IN NACL 100-0.45 UNIT/ML-% IJ SOLN
1000.0000 [IU]/h | INTRAMUSCULAR | Status: AC
  Administered 2016-03-13: 1000 [IU]/h via INTRAVENOUS
  Filled 2016-03-13: qty 250

## 2016-03-13 MED ORDER — DILTIAZEM HCL 100 MG IV SOLR
5.0000 mg/h | INTRAVENOUS | Status: DC
  Administered 2016-03-13 – 2016-03-14 (×2): 5 mg/h via INTRAVENOUS
  Filled 2016-03-13 (×2): qty 100

## 2016-03-13 MED ORDER — ATORVASTATIN CALCIUM 10 MG PO TABS
10.0000 mg | ORAL_TABLET | Freq: Every day | ORAL | Status: DC
  Administered 2016-03-14: 10 mg via ORAL
  Filled 2016-03-13: qty 1

## 2016-03-13 MED ORDER — POTASSIUM CHLORIDE CRYS ER 20 MEQ PO TBCR
40.0000 meq | EXTENDED_RELEASE_TABLET | Freq: Once | ORAL | Status: AC
  Administered 2016-03-13: 40 meq via ORAL
  Filled 2016-03-13: qty 2

## 2016-03-13 MED ORDER — PANTOPRAZOLE SODIUM 40 MG PO TBEC: 40.0000 mg | DELAYED_RELEASE_TABLET | Freq: Every day | ORAL | Status: DC | PRN

## 2016-03-13 MED ORDER — ONDANSETRON HCL 4 MG/2ML IJ SOLN: 4.0000 mg | Freq: Four times a day (QID) | INTRAMUSCULAR | Status: DC | PRN
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
## 2016-03-13 NOTE — Progress Notes (Signed)
ANTICOAGULATION CONSULT NOTE - Initial Consult  Pharmacy Consult for Heparin Indication: atrial fibrillation  Allergies  Allergen Reactions  . Lisinopril Swelling    H/o of angioedema   . Chlorthalidone Other (See Comments)    Causes severe hyponatremia  . Hyoscyamine Swelling  . Prevacid [Lansoprazole] Swelling  . Valium [Diazepam]     "makes me feel crazy"    Patient Measurements: Weight: 68 kg (11/14/15) IBW: 63.8 kg Heparin Dosing Weight: 68 kg  Vital Signs: Temp: 98 F (36.7 C) (12/26 1355) Temp Source: Oral (12/26 1355) BP: 146/72 (12/26 1730) Pulse Rate: 95 (12/26 1730)  Labs:  Recent Labs  03/13/16 1409  HGB 14.8  HCT 43.7  PLT 292  CREATININE 0.87    CrCl cannot be calculated (Unknown ideal weight.).   Medical History: Past Medical History:  Diagnosis Date  . Anxiety   . Cancer ([place])    testicular   . Chest pain    myoview 07/09/12-normal, nl ef; echo 04/12/05-ef>55%, mild aortic sclerosis  . GERD (gastroesophageal reflux disease)   . H. pylori infection   . Headache   . Hypercholesteremia   . Hyperlipidemia   . Hypertension   . Palpitations    event monior 04/11/05- SR with occ PAC  . Personal history of colonic polyps 2 9 2007    Assessment: 13 YOM who presented on 12/26 with palpitations and was found to be in new onset Afib with RVR. Pharmacy was consulted to start heparin for anticoagulation.   The patient was only on a bASA PTA and has no recent surgeries or hx CVA noted. Baseline CBC wnl, Hep Wt: 68 kg  Goal of Therapy:  Heparin level 0.3-0.7 units/ml Monitor platelets by anticoagulation protocol: Yes   Plan:  1. Heparin bolus of 3500 units x 1 2. Start Heparin at 1000 units/hr (10 ml/hr) 3. Daily HL, CBC 4. Will continue to monitor for any signs/symptoms of bleeding and will follow up with heparin level in 6 hours   Thank you for allowing pharmacy to be a part of this patient's care.  Alycia Rossetti, PharmD, BCPS Clinical  Pharmacist Pager: [phone] 03/13/2016 6:21 PM

## 2016-03-13 NOTE — ED Triage Notes (Signed)
Pt presents for evaluation of palpiations/fluttery feeling in chest. Pt. Denies CP/SOB. Pt states he was eating this AM and noticed abnormal feeling in chest. Pt. Reports has had previous episode similar to this. Pt AxO x4.

## 2016-03-13 NOTE — H&P (Signed)
History & Physical    Patient ID: [name] MRN: [id number], DOB/AGE: [birth year]   Admit date: 03/13/2016   Primary Physician: Miguel Chroman, MD Primary Cardiologist: Dr. Gwenlyn Parker  Patient Profile    70 yo male with PMH of HTN, HLD, palpitations, GERD and anxiety who presented to the ED with reports of palpitations and Parker to be in new onset of AF RVR.  Past Medical History    Past Medical History:  Diagnosis Date  . Anxiety   . 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 Chest pain    myoview 07/09/12-normal, nl ef; echo 04/12/05-ef>55%, mild aortic sclerosis  . GERD (gastroesophageal reflux disease)   . H. pylori infection   . Headache   . Hypercholesteremia   . Hyperlipidemia   . Hypertension   . Palpitations    event monior 04/11/05- SR with occ PAC  . Personal history of colonic polyps 2 9 2007    Past Surgical History:  Procedure Laterality Date  . Back Surgery  [id number], 2 ruptured dics  2007   2 ruptured discs  . CATARACT EXTRACTION W/PHACO Left 12/08/2012   Procedure: CATARACT EXTRACTION PHACO AND INTRAOCULAR LENS PLACEMENT (IOC);  Surgeon: Tonny Branch, MD;  Location: [facility];  Service: Ophthalmology;  Laterality: Left;  CDE:  23.19  . CATARACT EXTRACTION W/PHACO Right 04/15/2014   Procedure: CATARACT EXTRACTION PHACO AND INTRAOCULAR LENS PLACEMENT RIGHT;  Surgeon: Tonny Branch, MD;  Location: [facility];  Service: Ophthalmology;  Laterality: Right;  CDE:10.32  . COLONOSCOPY  12/06/2010   Procedure: COLONOSCOPY;  Surgeon: Rogene Houston, MD;  Location: [facility];  Service: Endoscopy;  Laterality: N/A;  . ESOPHAGOGASTRODUODENOSCOPY N/A 07/20/2015   Procedure: ESOPHAGOGASTRODUODENOSCOPY (EGD);  Surgeon: Rogene Houston, MD;  Location: [facility];  Service: Endoscopy;  Laterality: N/A;  2:55 - moved to 1:55 - Ann notified pt  . EYE SURGERY       Allergies  Allergies  Allergen Reactions  . Lisinopril Swelling    H/o of angioedema   . Chlorthalidone Other (See Comments)   Causes severe hyponatremia  . Hyoscyamine Swelling  . Prevacid [Lansoprazole] Swelling  . Valium [Diazepam]     "makes me feel crazy"    History of Present Illness    [name] is a 28yp male with PMH of HTN, HLD, palpitations, GERD and anxiety. He has been seen by Dr. Gwenlyn Parker in the past for reports of palpitations. Wore a monitor in 12/16 that showed SR with PVCs. He was being followed by his PCP and endocrinologist who were working with his thyroid medications. States he stopped his thyroid medication back in 1/17 and has felt somewhat better.   Echo 3/15 showed normal EF, stress test in 4/14 was normal.   Has continued to have intermittent episodes of palpitations. He associates them mostly after he eats, but only last about 20-30 seconds and resolve. Today he ate lunch and about 1330 he began to have this same "fluttering" feeling in his chest. This time the symptoms persisted and he presented to the ED. No chest pain, dyspnea, lightheadedness, nausea or vomiting.   In the ED his EKG showed AF RVR with rate of 155, incomplete RBBB and diffuse ST depression. Labs showed K+ 3.3, neg trop, Hgb 14.8, Mag 2.0, and CXR with mild atelectasis. He was started on IV cardizem with some rate improvement.   Home Medications    Prior to Admission medications   Medication Sig Start Date  End Date Taking? Authorizing Provider  amLODipine (NORVASC) 5 MG tablet Take 1 tablet (5 mg total) by mouth daily. 05/17/15   Lorretta Harp, MD  aspirin EC 81 MG tablet Take 81 mg by mouth every other day.    Historical Provider, MD  atorvastatin (LIPITOR) 10 MG tablet Take 10 mg by mouth daily at 6 PM.     Historical Provider, MD  pantoprazole (PROTONIX) 40 MG tablet Take 1 tablet (40 mg total) by mouth daily as needed (acid reflux). Reported on 06/09/2015 06/09/15   Butch Penny, NP    Family History    Family History  Problem Relation Age of Onset  . Hypertension Father   . Hyperlipidemia Father     Social  History    Social History   Social History  . Marital status: Married    Spouse name: N/A  . Number of children: N/A  . Years of education: N/A   Occupational History  . Not on file.   Social History Main Topics  . Smoking status: Never Smoker  . Smokeless tobacco: Never Used  . Alcohol use No  . Drug use: No  . Sexual activity: Not on file   Other Topics Concern  . Not on file   Social History Narrative   ** Merged History Encounter **         Review of Systems    General:  No chills, fever, night sweats or weight changes.  Cardiovascular:  See HPI Dermatological: No rash, lesions/masses Respiratory: No cough, dyspnea Urologic: No hematuria, dysuria Abdominal:   No nausea, vomiting, diarrhea, bright red blood per rectum, melena, or hematemesis Neurologic:  No visual changes, wkns, changes in mental status. All other systems reviewed and are otherwise negative except as noted above.  Physical Exam    Blood pressure 176/79, pulse (!) 121, temperature 98 F (36.7 C), temperature source Oral, resp. rate 16, SpO2 99 %.  General: Pleasant older WM, NAD Psych: Normal affect. Neuro: Alert and oriented X 3. Moves all extremities spontaneously. HEENT: Normal  Neck: Supple without bruits or JVD. Lungs:  Resp regular and unlabored, CTA. Heart: Irreg Irreg, no s3, s4, or murmurs. Abdomen: Soft, non-tender, non-distended, BS + x 4.  Extremities: No clubbing, cyanosis or edema. DP/PT/Radials 2+ and equal bilaterally.  Labs    Troponin [facility] Test)  Recent Labs  03/13/16 1436  TROPIPOC 0.00   No results for input(s): CKTOTAL, CKMB, TROPONINI in the last 72 hours. Lab Results  Component Value Date   WBC 9.5 03/13/2016   HGB 14.8 03/13/2016   HCT 43.7 03/13/2016   MCV 88.3 03/13/2016   PLT 292 03/13/2016     Recent Labs Lab 03/13/16 1409  NA 136  K 3.3*  CL 100*  CO2 24  BUN 8  CREATININE 0.87  CALCIUM 9.5  GLUCOSE 250*   Lab Results    Component Value Date   CHOL 172 12/20/2014   HDL 46 12/20/2014   LDLCALC 106 12/20/2014   TRIG 99 12/20/2014   No results Parker for: [facility]   Radiology Studies    Dg Chest Port 1 View  Result Date: 03/13/2016 CLINICAL DATA:  Palpitations EXAM: PORTABLE CHEST 1 VIEW COMPARISON:  05/21/2015 FINDINGS: Mild bibasilar atelectasis. Negative for heart failure. Lungs otherwise clear without infiltrate or effusion. IMPRESSION: Mild bibasilar atelectasis. Electronically Signed   By: Franchot Gallo M.D.   On: 03/13/2016 14:49    ECG & Cardiac Imaging  EKG: AF RVR rate 155  Echo: 3/15  Study Conclusions  - Left ventricle: The cavity size was normal. Systolic function was normal. The estimated ejection fraction was in the range of 55% to 60%. Wall motion was normal; there were no regional wall motion abnormalities. - Left atrium: The atrium was mildly dilated. - Atrial septum: No defect or patent foramen ovale was identified.  Assessment & Plan    70 yo male with PMH of HTN, HLD, palpitations, GERD and anxiety who presented to the ED with reports of palpitations and Parker to be in new onset of AF RVR.  1. AF RVR: Has been seen for palpitations in the past, wore a monitor but only noted PVCs. Reports he was taken of thyroid medication earlier this year and symptoms improved. Has continued to have some brief 20-30 sec episodes, but today experienced them again after eating lunch. This time the "fluttering" persisted. EKG in the ED showed AF RVR. He was started on IV cardizem with improvement. No chest pain. -- Admit to stepdown -- Will continue IV cardizem, start IV heparin for anticoagulation. This patients CHA2DS2-VASc Score and unadjusted Ischemic Stroke Rate (% per year) is equal to 2.2 % stroke rate/year from a score of 2 HTN,Age if 65-74. Will need to transition to [facility] prior to discharge.  -- No room on schedule for TEE/DCCV tomorrow. Have tentatively placed on for Thursday  in the event that he does not convert on his own with medical therapy.   -- check ECHO -- TSH  2. HTN: Somewhat hypertensive in the ED. Hold amlodipine since his is on cardizem drip. Monitor BP  3. HLD: on statin  4. GERD: on protonix  5. Hypokalemia: Replete, check BMET in the am.   Signed, Reino Bellis, NP-C Pager [phone] 03/13/2016, 4:17 PM   .Agree with note by Reino Bellis NP-C  Patient well-known to me from my office. He has a history of hypertension, hyperlipidemia and palpitations in the past. Her prolonged palpitations today was seen in the emergency room where he was Parker to be in A. fib with RVR. His rate was slowed with IV diltiazem. He has been heparinized. His exam is benign. Plan admit, TEE DC cardioversion on Thursday.    Lorretta Harp, M.D., [place], [facility], [place] [address] [place], [place]  [address]  [phone] 03/13/2016 5:18 PM

## 2016-03-13 NOTE — ED Notes (Signed)
Cardiology at bedside.

## 2016-03-13 NOTE — ED Notes (Signed)
Waiting on cardiology, patient and family updated

## 2016-03-14 ENCOUNTER — Observation Stay (HOSPITAL_BASED_OUTPATIENT_CLINIC_OR_DEPARTMENT_OTHER): Payer: Medicare Other

## 2016-03-14 ENCOUNTER — Other Ambulatory Visit (HOSPITAL_COMMUNITY): Payer: Medicare Other

## 2016-03-14 DIAGNOSIS — I4891 Unspecified atrial fibrillation: Secondary | ICD-10-CM

## 2016-03-14 DIAGNOSIS — I10 Essential (primary) hypertension: Secondary | ICD-10-CM | POA: Diagnosis not present

## 2016-03-14 DIAGNOSIS — I480 Paroxysmal atrial fibrillation: Secondary | ICD-10-CM | POA: Diagnosis not present

## 2016-03-14 LAB — BASIC METABOLIC PANEL
Anion gap: 4 — ABNORMAL LOW (ref 5–15)
BUN: 5 mg/dL — AB (ref 6–20)
CHLORIDE: 109 mmol/L (ref 101–111)
CO2: 26 mmol/L (ref 22–32)
Calcium: 9 mg/dL (ref 8.9–10.3)
Creatinine, Ser: 0.73 mg/dL (ref 0.61–1.24)
GFR calc Af Amer: >60 mL/min (ref >60)
GFR calc non Af Amer: >60 mL/min (ref >60)
Glucose, Bld: 126 mg/dL — ABNORMAL HIGH (ref 65–99)
POTASSIUM: 4.2 mmol/L (ref 3.5–5.1)
SODIUM: 139 mmol/L (ref 135–145)

## 2016-03-14 LAB — ECHOCARDIOGRAM COMPLETE
CHL CUP LV S' LATERAL: 14.7 cm/s
E decel time: 261 msec
EERAT: 7.1
FS: 37 % (ref 28–44)
HEIGHTINCHES: 68 in
IVS/LV PW RATIO, ED: 0.93
LA ID, A-P, ES: 28 mm
LA diam end sys: 28 mm
LA diam index: 1.56 cm/m2
LA vol A4C: 38.8 ml
LA vol index: 21.6 mL/m2
LA vol: 38.9 mL
LDCA: 3.14 cm2
LV TDI E'LATERAL: 9.9
LV e' LATERAL: 9.9 cm/s
LVEEAVG: 7.1
LVEEMED: 7.1
LVOT VTI: 18.9 cm
LVOT diameter: 20 mm
LVOT peak grad rest: 5 mmHg
LVOT peak vel: 115 cm/s
LVOTSV: 59 mL
MV Dec: 261
MV pk A vel: 64.5 m/s
MV pk E vel: 70.3 m/s
PW: 9.87 mm — AB (ref 0.6–1.1)
RV TAPSE: 23.5 mm
TDI e' medial: 5.63
WEIGHTICAEL: 2377.44 [oz_av]

## 2016-03-14 LAB — HEPARIN LEVEL (UNFRACTIONATED)
Heparin Unfractionated: 0.63 IU/mL (ref 0.30–0.70)
Heparin Unfractionated: 0.65 IU/mL (ref 0.30–0.70)

## 2016-03-14 LAB — TSH: TSH: 2.183 u[IU]/mL (ref 0.350–4.500)

## 2016-03-14 MED ORDER — APIXABAN 5 MG PO TABS
5.0000 mg | ORAL_TABLET | Freq: Two times a day (BID) | ORAL | Status: DC
  Administered 2016-03-14 – 2016-03-15 (×3): 5 mg via ORAL
  Filled 2016-03-14 (×3): qty 1

## 2016-03-14 MED ORDER — DILTIAZEM HCL ER COATED BEADS 180 MG PO CP24
180.0000 mg | ORAL_CAPSULE | Freq: Every day | ORAL | Status: DC
  Administered 2016-03-14 – 2016-03-15 (×2): 180 mg via ORAL
  Filled 2016-03-14 (×2): qty 1

## 2016-03-14 NOTE — Progress Notes (Signed)
Subjective:  Admitted with AFIB with RVR. On IV Hep/Dilt. Converted to NSR last PM. Clinically improved  Objective:  Temp:  [97.8 F (36.6 C)-98.9 F (37.2 C)] 97.8 F (36.6 C) (12/27 0700) Pulse Rate:  [65-156] 71 (12/27 0700) Resp:  [14-22] 17 (12/27 0700) BP: (127-177)/(53-121) 133/70 (12/27 0700) SpO2:  [97 %-100 %] 98 % (12/27 0700) Weight:  [148 lb 9.4 oz (67.4 kg)] 148 lb 9.4 oz (67.4 kg) (12/26 1800) Weight change:   Intake/Output from previous day: 12/26 0701 - 12/27 0700 In: 160 [P.O.:120; I.V.:40] Out: -   Intake/Output from this shift: No intake/output data recorded.  Physical Exam: General appearance: alert and no distress Neck: no adenopathy, no carotid bruit, no JVD, supple, symmetrical, trachea midline and thyroid not enlarged, symmetric, no tenderness/mass/nodules Lungs: clear to auscultation bilaterally Heart: regular rate and rhythm, S1, S2 normal, no murmur, click, rub or gallop Extremities: extremities normal, atraumatic, no cyanosis or edema  Lab Results: Results for orders placed or performed during the hospital encounter of 03/13/16 (from the past 48 hour(s))  Basic metabolic panel     Status: Abnormal   Collection Time: 03/13/16  2:09 PM  Result Value Ref Range   Sodium 136 135 - 145 mmol/L   Potassium 3.3 (L) 3.5 - 5.1 mmol/L   Chloride 100 (L) 101 - 111 mmol/L   CO2 24 22 - 32 mmol/L   Glucose, Bld 250 (H) 65 - 99 mg/dL   BUN 8 6 - 20 mg/dL   Creatinine, Ser 0.87 0.61 - 1.24 mg/dL   Calcium 9.5 8.9 - 10.3 mg/dL   GFR calc non Af Amer >60 >60 mL/min   GFR calc Af Amer >60 >60 mL/min    Comment: (NOTE) The eGFR has been calculated using the CKD EPI equation. This calculation has not been validated in all clinical situations. eGFR's persistently <60 mL/min signify possible Chronic Kidney Disease.    Anion gap 12 5 - 15  CBC     Status: None   Collection Time: 03/13/16  2:09 PM  Result Value Ref Range   WBC 9.5 4.0 - 10.5 K/uL   RBC 4.95 4.22 - 5.81 MIL/uL   Hemoglobin 14.8 13.0 - 17.0 g/dL   HCT 43.7 39.0 - 52.0 %   MCV 88.3 78.0 - 100.0 fL   MCH 29.9 26.0 - 34.0 pg   MCHC 33.9 30.0 - 36.0 g/dL   RDW 13.1 11.5 - 15.5 %   Platelets 292 150 - 400 K/uL  I-stat troponin, ED     Status: None   Collection Time: 03/13/16  2:36 PM  Result Value Ref Range   Troponin i, poc 0.00 0.00 - 0.08 ng/mL   Comment 3            Comment: Due to the release kinetics of cTnI, a negative result within the first hours of the onset of symptoms does not rule out myocardial infarction with certainty. If myocardial infarction is still suspected, repeat the test at appropriate intervals.   Magnesium     Status: None   Collection Time: 03/13/16  2:40 PM  Result Value Ref Range   Magnesium 2.0 1.7 - 2.4 mg/dL  MRSA PCR Screening     Status: None   Collection Time: 03/13/16  6:27 PM  Result Value Ref Range   MRSA by PCR NEGATIVE NEGATIVE    Comment:        The GeneXpert MRSA Assay (FDA approved for NASAL specimens  only), is one component of a comprehensive MRSA colonization surveillance program. It is not intended to diagnose MRSA infection nor to guide or monitor treatment for MRSA infections.   Basic metabolic panel     Status: Abnormal   Collection Time: 03/14/16  1:46 AM  Result Value Ref Range   Sodium 139 135 - 145 mmol/L   Potassium 4.2 3.5 - 5.1 mmol/L    Comment: DELTA CHECK NOTED   Chloride 109 101 - 111 mmol/L   CO2 26 22 - 32 mmol/L   Glucose, Bld 126 (H) 65 - 99 mg/dL   BUN 5 (L) 6 - 20 mg/dL   Creatinine, Ser 0.73 0.61 - 1.24 mg/dL   Calcium 9.0 8.9 - 10.3 mg/dL   GFR calc non Af Amer >60 >60 mL/min   GFR calc Af Amer >60 >60 mL/min    Comment: (NOTE) The eGFR has been calculated using the CKD EPI equation. This calculation has not been validated in all clinical situations. eGFR's persistently <60 mL/min signify possible Chronic Kidney Disease.    Anion gap 4 (L) 5 - 15  TSH     Status: None    Collection Time: 03/14/16  1:46 AM  Result Value Ref Range   TSH 2.183 0.350 - 4.500 uIU/mL    Comment: Performed by a 3rd Generation assay with a functional sensitivity of <=0.01 uIU/mL.  Heparin level (unfractionated)     Status: None   Collection Time: 03/14/16  1:46 AM  Result Value Ref Range   Heparin Unfractionated 0.63 0.30 - 0.70 IU/mL    Comment:        IF HEPARIN RESULTS ARE BELOW EXPECTED VALUES, AND PATIENT DOSAGE HAS BEEN CONFIRMED, SUGGEST FOLLOW UP TESTING OF ANTITHROMBIN III LEVELS.   Heparin level (unfractionated)     Status: None   Collection Time: 03/14/16  7:54 AM  Result Value Ref Range   Heparin Unfractionated 0.65 0.30 - 0.70 IU/mL    Comment:        IF HEPARIN RESULTS ARE BELOW EXPECTED VALUES, AND PATIENT DOSAGE HAS BEEN CONFIRMED, SUGGEST FOLLOW UP TESTING OF ANTITHROMBIN III LEVELS.     Imaging: Imaging results have been reviewed  Tele- NSR  Assessment/Plan:   1. Active Problems: 2.   Atrial fibrillation ([place]) 3.   Time Spent Directly with Patient:  20 minutes  Length of Stay:  LOS: 0 days   Pt converted to NSR over night. Clinically improved. 2D pending. Will DC IV dilt and convert to PO. D/C amlodipine. Start NOAC (CHA2DsVASc2 = 2). Tx tele. Prob home tomorrow.  Quay Burow 03/14/2016, 9:02 AM

## 2016-03-14 NOTE — Progress Notes (Signed)
ANTICOAGULATION CONSULT NOTE - Follow-up Consult  Pharmacy Consult for Heparin > Apixaban Indication: atrial fibrillation  Allergies  Allergen Reactions  . Lisinopril Swelling    H/o of angioedema   . Chlorthalidone Other (See Comments)    Causes severe hyponatremia  . Hyoscyamine Swelling  . Prevacid [Lansoprazole] Swelling  . Valium [Diazepam]     "makes me feel crazy"    Patient Measurements: Weight: 68 kg (11/14/15) IBW: 63.8 kg Heparin Dosing Weight: 68 kg  Vital Signs: Temp: 97.8 F (36.6 C) (12/27 0700) Temp Source: Oral (12/27 0700) BP: 133/70 (12/27 0700) Pulse Rate: 71 (12/27 0700)  Labs:  Recent Labs  03/13/16 1409 03/14/16 0146 03/14/16 0754  HGB 14.8  --   --   HCT 43.7  --   --   PLT 292  --   --   HEPARINUNFRC  --  0.63 0.65  CREATININE 0.87 0.73  --     Estimated Creatinine Clearance: 83.1 mL/min (by C-G formula based on SCr of 0.73 mg/dL).   Assessment: 70 YOM on heparin for new onset Afib with RVR. Heparin level therapeutic (0.65) on gtt at 1000 units/hr. No bleeding noted.  Pharmacy asked to change heparin to apixaban this morning.  Goal of Therapy:  Heparin level 0.3-0.7 units/ml Monitor platelets by anticoagulation protocol: Yes   Plan:  D/c heparin at 10 AM. Give first dose of apixaban at this time. Apixaban 5 mg BID. Will educate patient about apixaban prior to discharge.  Thank you for allowing pharmacy to be a part of this patient's care.  Sherlon Handing, PharmD, BCPS Clinical pharmacist, pager [phone] 03/14/2016 9:15 AM

## 2016-03-14 NOTE — Discharge Instructions (Addendum)

## 2016-03-14 NOTE — Progress Notes (Signed)
Echocardiogram 2D Echocardiogram has been performed.  [name] 03/14/2016, 2:48 PM

## 2016-03-14 NOTE — Progress Notes (Signed)
ANTICOAGULATION CONSULT NOTE - Follow-up Consult  Pharmacy Consult for Heparin Indication: atrial fibrillation  Allergies  Allergen Reactions  . Lisinopril Swelling    H/o of angioedema   . Chlorthalidone Other (See Comments)    Causes severe hyponatremia  . Hyoscyamine Swelling  . Prevacid [Lansoprazole] Swelling  . Valium [Diazepam]     "makes me feel crazy"    Patient Measurements: Weight: 68 kg (11/14/15) IBW: 63.8 kg Heparin Dosing Weight: 68 kg  Vital Signs: Temp: 98.1 F (36.7 C) (12/26 2355) Temp Source: Oral (12/26 2355) BP: 127/67 (12/26 2355) Pulse Rate: 74 (12/26 2355)  Labs:  Recent Labs  03/13/16 1409 03/14/16 0146  HGB 14.8  --   HCT 43.7  --   PLT 292  --   HEPARINUNFRC  --  0.63  CREATININE 0.87 0.73    Estimated Creatinine Clearance: 83.1 mL/min (by C-G formula based on SCr of 0.73 mg/dL).   Assessment: 70 YOM on heparin for new onset Afib with RVR. Heparin level therapeutic (0.63) on gtt at 1000 units/hr. No bleeding noted.  Goal of Therapy:  Heparin level 0.3-0.7 units/ml Monitor platelets by anticoagulation protocol: Yes   Plan:  Continue heparin 1000 units/hr  Will f/u 6 hr confirmatory heparin level  Thank you for allowing pharmacy to be a part of this patient's care.  Sherlon Handing, PharmD, BCPS Clinical pharmacist, pager [phone] 03/14/2016 2:37 AM

## 2016-03-14 NOTE — Care Management Obs Status (Signed)
[place] NOTIFICATION   Patient Details  Name: OLUWATIMILEHIN LIESCH MRN: [id number] Date of Birth: 1945-11-01   Medicare Observation Status Notification Given:  Yes    Erenest Rasher, RN 03/14/2016, 2:47 PM

## 2016-03-14 NOTE — Care Management Note (Addendum)
Case Management Note  Patient Details  Name: [name] MRN: [id number] Date of Birth: 04-20-1945  Subjective/Objective:  afib RVR                   Action/Plan: Discharge Planning: NCM spoke to pt and wife at bedside. Pt still works part-time. Was independent prior to hospital stay. Provided pt with Eliquis 30 day free card. Contacted pt's pharmacy, [facility] and they do have in stock. Checking benefits. Will have info cc to 3w CM to follow up on copay price.   PCP [name] B MD   S/W  ALELIA @ [employer] # [phone]   ELIQUIS 5 MG BID 30 /60 TAB   COVER- YES  CO-PAY- $ 37.00  TIER- 3 DRUG  PRIOR APPROVAL - NO  PH[facility]ACY : [facility]  03/14/2016 1830 NCM made pt aware of copay.   Expected Discharge Date:           Expected Discharge Plan:  Home/Self Care  In-House Referral:  NA  Discharge planning Services  CM Consult, Medication Assistance  Post Acute Care Choice:  NA Choice offered to:  NA  DME Arranged:  N/A DME Agency:  NA  HH Arranged:  NA HH Agency:  NA  Status of Service:  Completed, signed off  If discussed at [place] of Stay Meetings, dates discussed:    Additional Comments:  Erenest Rasher, RN 03/14/2016, 3:11 PM

## 2016-03-15 ENCOUNTER — Encounter (HOSPITAL_COMMUNITY)
Admission: EM | Disposition: A | Discharge status: Home / Self Care | Payer: Self-pay | Source: Home / Self Care | Attending: Emergency Medicine

## 2016-03-15 DIAGNOSIS — I480 Paroxysmal atrial fibrillation: Secondary | ICD-10-CM | POA: Diagnosis not present

## 2016-03-15 DIAGNOSIS — I10 Essential (primary) hypertension: Secondary | ICD-10-CM | POA: Diagnosis not present

## 2016-03-15 SURGERY — CARDIOVERSION
Anesthesia: Monitor Anesthesia Care

## 2016-03-15 MED ORDER — APIXABAN 5 MG PO TABS: 5.0000 mg | ORAL_TABLET | Freq: Two times a day (BID) | ORAL | 12 refills | Status: DC

## 2016-03-15 MED ORDER — DILTIAZEM HCL ER COATED BEADS 180 MG PO CP24: 180.0000 mg | ORAL_CAPSULE | Freq: Every day | ORAL | 12 refills | Status: DC

## 2016-03-15 MED ORDER — APIXABAN 5 MG PO TABS: 5.0000 mg | ORAL_TABLET | Freq: Two times a day (BID) | ORAL | 0 refills | Status: DC

## 2016-03-15 NOTE — Discharge Summary (Signed)
Discharge Summary    Patient ID: [name],  MRN: [id number], DOB/AGE: [birth year] 70 y.o.  Admit date: 03/13/2016 Discharge date: 03/15/2016  Primary Care Provider: Glenda Chroman Primary Cardiologist: Dr. [name]   Discharge Diagnoses    Active Problems:   Atrial fibrillation ([place])   Allergies Allergies  Allergen Reactions  . Lisinopril Swelling    H/o of angioedema   . Chlorthalidone Other (See Comments)    Causes severe hyponatremia  . Hyoscyamine Swelling  . Prevacid [Lansoprazole] Swelling  . Valium [Diazepam]     "makes me feel crazy"    Diagnostic Studies/Procedures  Transthoracic Echocardiography  Study Conclusions  - Left ventricle: The cavity size was normal. There was moderate   concentric hypertrophy. Systolic function was normal. The   estimated ejection fraction was in the range of 60% to 65%. Wall   motion was normal; there were no regional wall motion   abnormalities. Doppler parameters are consistent with abnormal   left ventricular relaxation (grade 1 diastolic dysfunction).   There was no evidence of elevated ventricular filling pressure by   Doppler parameters. - Aortic valve: Trileaflet; normal thickness leaflets. There was no   regurgitation. - Aortic root: The aortic root was normal in size. - Ascending aorta: The ascending aorta was normal in size. - Mitral valve: Structurally normal valve. There was no   regurgitation. - Right ventricle: The cavity size was normal. Wall thickness was   normal. Systolic function was normal. - Right atrium: The atrium was normal in size. - Tricuspid valve: There was trivial regurgitation. - Pulmonary arteries: Systolic pressure was within the normal   range. - Inferior vena cava: The vessel was normal in size. - Pericardium, extracardiac: There was no pericardial effusion.  _____________   History of Present Illness     [name] is a 70yo male with PMH of HTN, HLD, palpitations, GERD and  anxiety. He has been seen by Dr. [name] in the past for reports of palpitations. Wore a monitor in 12/16 that showed SR with PVCs. He was being followed by his PCP and endocrinologist who were working with his thyroid medications. States he stopped his thyroid medication back in 1/17 and has felt somewhat better.   Echo 3/15 showed normal EF, stress test in 4/14 was normal.   Has continued to have intermittent episodes of palpitations. He associates them mostly after he eats, but only last about 20-30 seconds and resolve. on 03/13/16, he ate lunch and about 1330 he began to have this same "fluttering" feeling in his chest. This time the symptoms persisted and he presented to the ED. No chest pain, dyspnea, lightheadedness, nausea or vomiting.   In the ED his EKG showed AF RVR with rate of 155, incomplete RBBB and diffuse ST depression. Labs showed K+ 3.3, neg trop, Hgb 14.8, Mag 2.0, and CXR with mild atelectasis. He was started on IV cardizem with some rate improvement.   Hospital Course     1. AF RVR: on po diltiazem 180mg  daily. Continue. He is in NSR, he converted on IV diltiazem.   This patients CHA2DS2-VASc Score and unadjusted Ischemic Stroke Rate (% per year) is equal to 2.2 % stroke rate/year from a score of 2HTN,Age if 65-74.   On Eliquis for anticoagulation. Will discontinue ASA.   2. HTN: Well controlled  3. HLD: on statin  4. GERD: on protonix _____________  Discharge Vitals Blood pressure 117/70, pulse 70, temperature 97.9 F (36.6 C), temperature  source Oral, resp. rate 18, height 5\' 8"  (1.727 m), weight 144 lb 8 oz (65.5 kg), SpO2 97 %.  Filed Weights   03/13/16 1800 03/15/16 0422  Weight: 148 lb 9.4 oz (67.4 kg) 144 lb 8 oz (65.5 kg)    Labs & Radiologic Studies     CBC  Recent Labs  03/13/16 1409  WBC 9.5  HGB 14.8  HCT 43.7  MCV 88.3  PLT 123456   Basic Metabolic Panel  Recent Labs  03/13/16 1409 03/13/16 1440 03/14/16 0146  NA 136  --  139    K 3.3*  --  4.2  CL 100*  --  109  CO2 24  --  26  GLUCOSE 250*  --  126*  BUN 8  --  5*  CREATININE 0.87  --  0.73  CALCIUM 9.5  --  9.0  MG  --  2.0  --    Thyroid Function Tests  Recent Labs  03/14/16 0146  TSH 2.183    Dg Chest Port 1 View  Result Date: 03/13/2016 CLINICAL DATA:  Palpitations EXAM: PORTABLE CHEST 1 VIEW COMPARISON:  05/21/2015 FINDINGS: Mild bibasilar atelectasis. Negative for heart failure. Lungs otherwise clear without infiltrate or effusion. IMPRESSION: Mild bibasilar atelectasis. Electronically Signed   By: Franchot Gallo M.D.   On: 03/13/2016 14:49    Disposition   Pt is being discharged home today in good condition.  Follow-up Plans & Appointments    Follow-up Information    Quay Burow, MD Follow up on 03/30/2016.   Specialties:  Cardiology, Radiology Why:  at 9:15am for hospital follow up  Contact information: [address] [place] [address] [phone]          Discharge Instructions    Diet - low sodium heart healthy    Complete by:  As directed    Increase activity slowly    Complete by:  As directed       Discharge Medications   Current Discharge Medication List    START taking these medications   Details  apixaban (ELIQUIS) 5 MG TABS tablet Take 1 tablet (5 mg total) by mouth 2 (two) times daily. Qty: 60 tablet, Refills: 12    diltiazem (CARDIZEM CD) 180 MG 24 hr capsule Take 1 capsule (180 mg total) by mouth daily. Qty: 30 capsule, Refills: 12      CONTINUE these medications which have NOT CHANGED   Details  aspirin EC 81 MG tablet Take 81 mg by mouth every other day.    atorvastatin (LIPITOR) 10 MG tablet Take 10 mg by mouth daily at 6 PM.     pantoprazole (PROTONIX) 40 MG tablet Take 1 tablet (40 mg total) by mouth daily as needed (acid reflux). Reported on 06/09/2015 Qty: 30 tablet, Refills: 3   Associated Diagnoses: Gastroesophageal reflux disease without esophagitis      STOP  taking these medications     amLODipine (NORVASC) 5 MG tablet             Outstanding Labs/Studies     Duration of Discharge Encounter   Greater than 30 minutes including physician time.  Signed, Arbutus Leas NP 03/15/2016, 12:03 PM

## 2016-03-15 NOTE — Progress Notes (Signed)
Call placed to Bassett, RN regarding patient scheduled for TEE/cardioversion today. She states patient is in Sinus Rhythm at this time. States she will notify MD. TEE/cardioversion canceled for today due to patient self converted.

## 2016-03-15 NOTE — Progress Notes (Signed)
Patient Name: [name] Date of Encounter: 03/15/2016  Primary Cardiologist: Dr. Serena Croissant Problem List     Active Problems:   Atrial fibrillation [facility])     Subjective     Feels well, denies chest pain and palpitations.   Inpatient Medications    Scheduled Meds: . apixaban  5 mg Oral BID  . aspirin EC  81 mg Oral QODAY  . atorvastatin  10 mg Oral q1800  . diltiazem  180 mg Oral Daily   Continuous Infusions:  PRN Meds: acetaminophen, ondansetron (ZOFRAN) IV   Vital Signs    Vitals:   03/15/16 0008 03/15/16 0422 03/15/16 0634 03/15/16 0753  BP: 110/65 (!) 152/71 (!) 141/75 131/65  Pulse: 64 74  78  Resp:    18  Temp: 98.3 F (36.8 C) 97.9 F (36.6 C)  97.9 F (36.6 C)  TempSrc: Oral Oral  Oral  SpO2: 95% 95%  96%  Weight:  144 lb 8 oz (65.5 kg)    Height:        Intake/Output Summary (Last 24 hours) at 03/15/16 0923 Last data filed at 03/15/16 0848  Gross per 24 hour  Intake              442 ml  Output                0 ml  Net              442 ml   Filed Weights   03/13/16 1800 03/15/16 0422  Weight: 148 lb 9.4 oz (67.4 kg) 144 lb 8 oz (65.5 kg)    Physical Exam    GEN: Well nourished, well developed, in no acute distress.  HEENT: Grossly normal.  Neck: Supple, no JVD, carotid bruits, or masses. Cardiac: RRR, no murmurs, rubs, or gallops. No clubbing, cyanosis, edema.  Radials/DP/PT 2+ and equal bilaterally.  Respiratory:  Respirations regular and unlabored, clear to auscultation bilaterally. GI: Soft, nontender, nondistended, BS + x 4. MS: no deformity or atrophy. Skin: warm and dry, no rash. Neuro:  Strength and sensation are intact. Psych: AAOx3.  Normal affect.  Labs    CBC  Recent Labs  03/13/16 1409  WBC 9.5  HGB 14.8  HCT 43.7  MCV 88.3  PLT 123456   Basic Metabolic Panel  Recent Labs  03/13/16 1409 03/13/16 1440 03/14/16 0146  NA 136  --  139  K 3.3*  --  4.2  CL 100*  --  109  CO2 24  --  26  GLUCOSE 250*   --  126*  BUN 8  --  5*  CREATININE 0.87  --  0.73  CALCIUM 9.5  --  9.0  MG  --  2.0  --    Thyroid Function Tests  Recent Labs  03/14/16 0146  TSH 2.183    Telemetry    NSR - Personally Reviewed    Radiology    Dg Chest Port 1 View  Result Date: 03/13/2016 CLINICAL DATA:  Palpitations EXAM: PORTABLE CHEST 1 VIEW COMPARISON:  05/21/2015 FINDINGS: Mild bibasilar atelectasis. Negative for heart failure. Lungs otherwise clear without infiltrate or effusion. IMPRESSION: Mild bibasilar atelectasis. Electronically Signed   By: Franchot Gallo M.D.   On: 03/13/2016 14:49    Cardiac Studies   Transthoracic Echocardiography 03/14/16 Study Conclusions  - Left ventricle: The cavity size was normal. There was moderate   concentric hypertrophy. Systolic function was normal. The   estimated ejection fraction  was in the range of 60% to 65%. Wall   motion was normal; there were no regional wall motion   abnormalities. Doppler parameters are consistent with abnormal   left ventricular relaxation (grade 1 diastolic dysfunction).   There was no evidence of elevated ventricular filling pressure by   Doppler parameters. - Aortic valve: Trileaflet; normal thickness leaflets. There was no   regurgitation. - Aortic root: The aortic root was normal in size. - Ascending aorta: The ascending aorta was normal in size. - Mitral valve: Structurally normal valve. There was no   regurgitation. - Right ventricle: The cavity size was normal. Wall thickness was   normal. Systolic function was normal. - Right atrium: The atrium was normal in size. - Tricuspid valve: There was trivial regurgitation. - Pulmonary arteries: Systolic pressure was within the normal   range. - Inferior vena cava: The vessel was normal in size. - Pericardium, extracardiac: There was no pericardial effusion.   Patient Profile     70 yo male with PMH of HTN, HLD, palpitations, GERD and anxiety who presented to the ED  with reports of palpitations and found to be in new onset of AF RVR.   Assessment & Plan    1. AF RVR: Has been seen for palpitations in the past, wore a monitor but only noted PVCs. Reports he was taken of thyroid medication earlier this year and symptoms improved. Has continued to have some brief 20-30 sec episodes, but experienced them again on 03/13/16 which prompted him to seek medical attentions. This time the "fluttering" persisted. EKG in the ED showed AF RVR. He was started on IV cardizem with improvement. No chest pain.  IV Diltiazem changed to po yesterday, he is in NSR.   This patients CHA2DS2-VASc Score and unadjusted Ischemic Stroke Rate (% per year) is equal to 2.2 % stroke rate/year from a score of 2 HTN,Age if 65-74.   On Eliquis for anticoagulation.   2. HTN: Well controlled  3. HLD: on statin  4. GERD: on protonix  MD to advise on d/c   Signed, Arbutus Leas, NP  03/15/2016, 9:23 AM    Patient seen and examined. Agree with assessment and plan. Feels well; no cp or palpitation. Maintaining NSR. Tolerating diltiazem. Now on eliquis. No history of CAD or PVD, will dc ASA. OK for dc today.  Troy Sine, MD, [facility] 03/15/2016 11:35 AM

## 2016-03-30 ENCOUNTER — Ambulatory Visit (INDEPENDENT_AMBULATORY_CARE_PROVIDER_SITE_OTHER): Payer: Medicare Other | Admitting: Cardiovascular Disease

## 2016-03-30 ENCOUNTER — Encounter: Payer: Self-pay | Admitting: Cardiovascular Disease

## 2016-03-30 VITALS — BP 181/82 | HR 81 | Ht 68.0 in | Wt 149.8 lb

## 2016-03-30 DIAGNOSIS — I10 Essential (primary) hypertension: Secondary | ICD-10-CM | POA: Diagnosis not present

## 2016-03-30 DIAGNOSIS — I480 Paroxysmal atrial fibrillation: Secondary | ICD-10-CM | POA: Diagnosis not present

## 2016-03-30 DIAGNOSIS — E7800 Pure hypercholesterolemia, unspecified: Secondary | ICD-10-CM | POA: Diagnosis not present

## 2016-03-30 NOTE — Progress Notes (Signed)
03/30/2016 [name]   01-21-1946  [id number]  Primary Physician Glenda Chroman, MD Primary Cardiologist: Lorretta Harp MD Renae Gloss  HPI:  [name] is a 71 year old well-appearing Caucasian male who I saw 04/05/15. He has a history of hypertension and hyperlipidemia. He had atypical chest pain here tone with a negative Myoview/23/14. His pain improved with proton pump inhibition. Because of recurrent chest pain he saw [name], PA-C in the office who did obtain a 2-D echo and Myoview perfusion study all normal. His pain has improved again on a PPI suggesting that it was related to GERD. Since I saw [name] in the office a year ago he has been well until this past Christmas when he was admitted to [facility] with A. fib with RVR. He converted to normal sinus rhythm with IV diltiazem and was converted to by mouth Cardizem. His troponins were negative and 2-D echo was normal. Because of his elevated CHA2DsVASC2 score of 2 he was begun on Eliquis oral anticoagulation.  Current Outpatient Prescriptions  Medication Sig Dispense Refill  . apixaban (ELIQUIS) 5 MG TABS tablet Take 5 mg by mouth 2 (two) times daily.    Marland Kitchen atorvastatin (LIPITOR) 10 MG tablet Take 10 mg by mouth daily at 6 PM.     . diltiazem (CARDIZEM CD) 180 MG 24 hr capsule Take 1 capsule (180 mg total) by mouth daily. 30 capsule 12   No current facility-administered medications for this visit.     Allergies  Allergen Reactions  . Lisinopril Swelling    H/o of angioedema   . Chlorthalidone Other (See Comments)    Causes severe hyponatremia  . Hyoscyamine Swelling  . Prevacid [Lansoprazole] Swelling  . Valium [Diazepam]     "makes me feel crazy"    Social History   Social History  . Marital status: Married    Spouse name: N/A  . Number of children: N/A  . Years of education: N/A   Occupational History  . Not on file.   Social History Main Topics  . Smoking status: Never  Smoker  . Smokeless tobacco: Never Used  . Alcohol use No  . Drug use: No  . Sexual activity: Not on file   Other Topics Concern  . Not on file   Social History Narrative   ** Merged History Encounter **         Review of Systems: General: negative for chills, fever, night sweats or weight changes.  Cardiovascular: negative for chest pain, dyspnea on exertion, edema, orthopnea, palpitations, paroxysmal nocturnal dyspnea or shortness of breath Dermatological: negative for rash Respiratory: negative for cough or wheezing Urologic: negative for hematuria Abdominal: negative for nausea, vomiting, diarrhea, bright red blood per rectum, melena, or hematemesis Neurologic: negative for visual changes, syncope, or dizziness All other systems reviewed and are otherwise negative except as noted above.    Blood pressure (!) 181/82, pulse 81, height 5\' 8"  (1.727 m), weight 149 lb 12.8 oz (67.9 kg).  General appearance: alert and no distress Neck: no adenopathy, no carotid bruit, no JVD, supple, symmetrical, trachea midline and thyroid not enlarged, symmetric, no tenderness/mass/nodules Lungs: clear to auscultation bilaterally Heart: regular rate and rhythm, S1, S2 normal, no murmur, click, rub or gallop Extremities: extremities normal, atraumatic, no cyanosis or edema  EKG normal sinus rhythm 81 with RSR prime in lead V1 consistent with RV conduction delay and nonspecific ST and T-wave changes. I personally reviewed this  EKG  ASSESSMENT AND PLAN:   Hyperlipidemia History of hyperlipidemia on statin therapy followed by his PCP  HTN (hypertension) History of hypertension blood pressure measured 181/82. He is on Cardizem CD 180 mg a day. He has "white coat hypertension". I reviewed his home blood pressure readings which were in the normal range.  Atrial fibrillation ([place]) History of recently recognized A. fib during an ER visit when he was found to be in A. fib with RVR 03/13/16. He  converted to sinus rhythm with IV diltiazem and was converted to by mouth diltiazem. His 2-D echo was normal. Because of his elevated CHA2DsVASC2 score of 2 he was begun on Eliquis oral anticoagulation.      Lorretta Harp MD FACP,FACC,FAHA, [facility] 03/30/2016 9:33 AM

## 2016-03-30 NOTE — Assessment & Plan Note (Signed)
History of hypertension blood pressure measured 181/82. He is on Cardizem CD 180 mg a day. He has "white coat hypertension". I reviewed his home blood pressure readings which were in the normal range.

## 2016-03-30 NOTE — Assessment & Plan Note (Signed)
History of recently recognized A. fib during an ER visit when he was found to be in A. fib with RVR 03/13/16. He converted to sinus rhythm with IV diltiazem and was converted to by mouth diltiazem. His 2-D echo was normal. Because of his elevated CHA2DsVASC2 score of 2 he was begun on Eliquis oral anticoagulation.

## 2016-03-30 NOTE — Patient Instructions (Signed)

## 2016-03-30 NOTE — Assessment & Plan Note (Signed)
History of hyperlipidemia on statin therapy followed by his PCP 

## 2016-04-15 ENCOUNTER — Encounter (HOSPITAL_COMMUNITY): Payer: Self-pay | Admitting: Emergency Medicine

## 2016-04-15 ENCOUNTER — Emergency Department (HOSPITAL_COMMUNITY)
Admission: EM | Admit: 2016-04-15 | Discharge: 2016-04-15 | Disposition: A | Discharge status: Home / Self Care | Payer: Medicare Other | Attending: Emergency Medicine | Admitting: Emergency Medicine

## 2016-04-15 DIAGNOSIS — I10 Essential (primary) hypertension: Secondary | ICD-10-CM | POA: Insufficient documentation

## 2016-04-15 DIAGNOSIS — M79675 Pain in left toe(s): Secondary | ICD-10-CM | POA: Diagnosis not present

## 2016-04-15 DIAGNOSIS — Z79899 Other long term (current) drug therapy: Secondary | ICD-10-CM | POA: Insufficient documentation

## 2016-04-15 LAB — CBC WITH DIFFERENTIAL/PLATELET
BASOS ABS: 0.1 10*3/uL (ref 0.0–0.1)
Basophils Relative: 1 %
Eosinophils Absolute: 0.2 10*3/uL (ref 0.0–0.7)
Eosinophils Relative: 2 %
HEMATOCRIT: 40.3 % (ref 39.0–52.0)
HEMOGLOBIN: 13.5 g/dL (ref 13.0–17.0)
LYMPHS PCT: 18 %
Lymphs Abs: 2 10*3/uL (ref 0.7–4.0)
MCH: 29.9 pg (ref 26.0–34.0)
MCHC: 33.5 g/dL (ref 30.0–36.0)
MCV: 89.4 fL (ref 78.0–100.0)
MONO ABS: 0.9 10*3/uL (ref 0.1–1.0)
MONOS PCT: 8 %
NEUTROS PCT: 71 %
Neutro Abs: 7.9 10*3/uL — ABNORMAL HIGH (ref 1.7–7.7)
Platelets: 275 10*3/uL (ref 150–400)
RBC: 4.51 MIL/uL (ref 4.22–5.81)
RDW: 13.1 % (ref 11.5–15.5)
WBC: 11.1 10*3/uL — ABNORMAL HIGH (ref 4.0–10.5)

## 2016-04-15 LAB — URIC ACID: Uric Acid, Serum: 4 mg/dL — ABNORMAL LOW (ref 4.4–7.6)

## 2016-04-15 MED ORDER — HYDROCODONE-ACETAMINOPHEN 5-325 MG PO TABS: 1.0000 | ORAL_TABLET | ORAL | 0 refills | Status: DC | PRN

## 2016-04-15 MED ORDER — PREDNISONE 20 MG PO TABS: ORAL_TABLET | ORAL | 0 refills | Status: DC

## 2016-04-15 NOTE — ED Provider Notes (Signed)
Assumed care from PA Cowen at shift change.  See his note for full H&P.  Briefly, 71 y.o. M here with redness and left great toe pain which began last night.  No trauma.  No fever or chills.  No redness or streaking of the leg.  Patient anti-coagulated with xarelto, acute DVT felt less likely.  Concern for gout.  Plan:  Follow-up on labs, will start treatment with prednisone and pain medication.  Results for orders placed or performed during the hospital encounter of 04/15/16  Uric acid  Result Value Ref Range   Uric Acid, Serum 4.0 (L) 4.4 - 7.6 mg/dL  CBC with Differential  Result Value Ref Range   WBC 11.1 (H) 4.0 - 10.5 K/uL   RBC 4.51 4.22 - 5.81 MIL/uL   Hemoglobin 13.5 13.0 - 17.0 g/dL   HCT 40.3 39.0 - 52.0 %   MCV 89.4 78.0 - 100.0 fL   MCH 29.9 26.0 - 34.0 pg   MCHC 33.5 30.0 - 36.0 g/dL   RDW 13.1 11.5 - 15.5 %   Platelets 275 150 - 400 K/uL   Neutrophils Relative % 71 %   Neutro Abs 7.9 (H) 1.7 - 7.7 K/uL   Lymphocytes Relative 18 %   Lymphs Abs 2.0 0.7 - 4.0 K/uL   Monocytes Relative 8 %   Monocytes Absolute 0.9 0.1 - 1.0 K/uL   Eosinophils Relative 2 %   Eosinophils Absolute 0.2 0.0 - 0.7 K/uL   Basophils Relative 1 %   Basophils Absolute 0.1 0.0 - 0.1 K/uL   No results found. Labs reassuring.  Will treat with prednisone and pain medication.  Follow-up with PCP encouraged.  Discussed plan with patient, he acknowledged understanding and agreed with plan of care.  Return precautions given for new or worsening symptoms.   Larene Pickett, PA-C 04/15/16 [place], DO 04/18/16 [phone]

## 2016-04-15 NOTE — ED Triage Notes (Addendum)
Pt c/o left foot swelling x 1 day and painful to top of foot. There is no obvious swelling to foot noted in triage. nad

## 2016-04-15 NOTE — ED Provider Notes (Signed)
[facility] DEPT Provider Note   CSN: [id number] Arrival date & time: 04/15/16  1522   By signing my name below, I, [name], attest that this documentation has been prepared under the direction and in the presence of [employer] .  Electronically Signed: Collene Parker, Scribe. 04/15/16. 4:03 PM.  History   Chief Complaint Chief Complaint  Patient presents with  . Foot Pain    HPI Comments: [name] is a 71 y.o. male with a hx of atrial fibrillation, on eliquis, who presents to the Emergency Department complaining of left foot swelling that began yesterday night. This is a new problem. Patient reports associated left 1st digit toe pain. Patient is worried about having a blood clot. Patient reports using epsom salt with no improvement. Patient denies increased walking/standing, insect bites, recent travel, or any traumatic injury.    The history is provided by the patient. No language interpreter was used.    Past Medical History:  Diagnosis Date  . Anxiety   . 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 Chest pain    myoview 07/09/12-normal, nl ef; echo 04/12/05-ef>55%, mild aortic sclerosis  . GERD (gastroesophageal reflux disease)   . H. pylori infection   . Headache   . Hypercholesteremia   . Hyperlipidemia   . Hypertension   . Palpitations    event monior 04/11/05- SR with occ PAC  . Personal history of colonic polyps 2 9 2007    Patient Active Problem List   Diagnosis Date Noted  . Atrial fibrillation ([place]) 03/13/2016  . Hx of colonic polyps 12/15/2015  . Palpitations 02/25/2015  . Lumbago 01/01/2013  . Chest pain 08/08/2012  . HTN (hypertension) 08/08/2012  . Diarrhea 10/23/2010  . Epigastric pain 10/23/2010  . Anxiety   . Hyperlipidemia   . GERD (gastroesophageal reflux disease)   . COLONIC POLYPS 11/23/2008  . GERD 11/23/2008  . DYSPEPSIA, CHRONIC 11/23/2008  . IBS 11/23/2008  . LOSS OF APPETITE 11/23/2008  . EPIGASTRIC PAIN 11/23/2008  .  HEARTBURN, HX OF 11/23/2008    Past Surgical History:  Procedure Laterality Date  . BACK SURGERY    . CATARACT EXTRACTION W/PHACO Left 12/08/2012   Procedure: CATARACT EXTRACTION PHACO AND INTRAOCULAR LENS PLACEMENT (IOC);  Surgeon: Tonny Branch, MD;  Location: [facility];  Service: Ophthalmology;  Laterality: Left;  CDE:  23.19  . CATARACT EXTRACTION W/PHACO Right 04/15/2014   Procedure: CATARACT EXTRACTION PHACO AND INTRAOCULAR LENS PLACEMENT RIGHT;  Surgeon: Tonny Branch, MD;  Location: [facility];  Service: Ophthalmology;  Laterality: Right;  CDE:10.32  . COLONOSCOPY  12/06/2010   Procedure: COLONOSCOPY;  Surgeon: Rogene Houston, MD;  Location: [facility];  Service: Endoscopy;  Laterality: N/A;  . ESOPHAGOGASTRODUODENOSCOPY N/A 07/20/2015   Procedure: ESOPHAGOGASTRODUODENOSCOPY (EGD);  Surgeon: Rogene Houston, MD;  Location: [facility];  Service: Endoscopy;  Laterality: N/A;  2:55 - moved to 1:55 - Ann notified pt  . EYE SURGERY    . LUMBAR DISC SURGERY Left 2007   "L4-5 ruptured discs"       Home Medications    Prior to Admission medications   Medication Sig Start Date End Date Taking? Authorizing Provider  apixaban (ELIQUIS) 5 MG TABS tablet Take 5 mg by mouth 2 (two) times daily.    Historical Provider, MD  atorvastatin (LIPITOR) 10 MG tablet Take 10 mg by mouth daily at 6 PM.     Historical Provider, MD  diltiazem (CARDIZEM CD) 180 MG 24 hr  capsule Take 1 capsule (180 mg total) by mouth daily. 03/16/16   Arbutus Leas, NP    Family History Family History  Problem Relation Age of Onset  . Hypertension Father   . Hyperlipidemia Father     Social History Social History  Substance Use Topics  . Smoking status: Never Smoker  . Smokeless tobacco: Never Used  . Alcohol use No     Allergies   Lisinopril; Chlorthalidone; Hyoscyamine; Prevacid [lansoprazole]; and Valium [diazepam]   Review of Systems Review of Systems  Musculoskeletal: Positive for arthralgias (Foot).  All  other systems reviewed and are negative.    Physical Exam Updated Vital Signs BP 170/82 (BP Location: Left Arm)   Pulse 92   Temp 98.2 F (36.8 C) (Oral)   Resp 16   Ht 5\' 8"  (1.727 m)   Wt 149 lb (67.6 kg)   SpO2 96%   BMI 22.66 kg/m   Physical Exam  Constitutional: He is oriented to person, place, and time. He appears well-developed.  HENT:  Head: Normocephalic and atraumatic.  Mouth/Throat: Oropharynx is clear and moist.  Eyes: Conjunctivae and EOM are normal. Pupils are equal, round, and reactive to light.  Neck: Normal range of motion. Neck supple.  Cardiovascular: Normal rate.   Pulmonary/Chest: Effort normal.  Abdominal: Soft. Bowel sounds are normal.  Musculoskeletal: Normal range of motion.  Dorsalis pedis pulse is  2+ bilaterally. Capillary refill is less than 2 seconds. Achilles tendon intact bilaterally. No pitting edema. No lesions between the toes on the left foot. Tenderness at the left first MP joint. No puncture sites of the plantar surface. Slight increased warmth at the right MP joint at the first left toe.  Neurological: He is alert and oriented to person, place, and time.  Skin: Skin is warm and dry. Capillary refill takes less than 2 seconds.  Psychiatric: He has a normal mood and affect.     ED Treatments / Results  DIAGNOSTIC STUDIES: Oxygen Saturation is 96% on RA, adequate by my interpretation.    COORDINATION OF CARE: 3:58 PM Discussed treatment plan with pt at bedside and pt agreed to plan including blood work.   Labs (all labs ordered are listed, but only abnormal results are displayed) Labs Reviewed - No data to display  EKG  EKG Interpretation None       Radiology No results found.  Procedures Procedures (including critical care time)  Medications Ordered in ED Medications - No data to display   Initial Impression / Assessment and Plan / ED Course  I have reviewed the triage vital signs and the nursing notes.  Pertinent  labs & imaging results that were available during my care of the patient were reviewed by me and considered in my medical decision making (see chart for details).     *I have reviewed nursing notes, vital signs, and all appropriate lab and imaging results for this patient.**  Final Clinical Impressions(s) / ED Diagnoses   MDM: No evidence of septic joint. No neurovascular compromise. Suspect arthritis/gout. Will check uric acid.  CBC non-acute.  Uric acid pending. Care to be continued by Quincy Carnes, PA-C. Final diagnoses:  None    New Prescriptions New Prescriptions   No medications on file   **I personally performed the services described in this documentation, which was scribed in my presence. The recorded information has been reviewed and is accurate.   Lily Kocher, PA-C 04/16/16 2116    Francine Graven, DO 04/18/16 1348

## 2016-04-15 NOTE — ED Notes (Signed)
Pt reports he began having redness and pain to L great toe pain starting last night. Soaked foot in epsom salt with minimal improvement. Denies hx of gout.

## 2016-04-15 NOTE — Discharge Instructions (Addendum)
Take the prescribed medication as directed.  Do not drive while taking vicodin, it can make you sleepy. Follow-up with your primary care doctor. Return to the ED for new or worsening symptoms.

## 2016-04-19 ENCOUNTER — Ambulatory Visit (HOSPITAL_COMMUNITY)
Admission: RE | Admit: 2016-04-19 | Discharge: 2016-04-19 | Disposition: A | Discharge status: Home / Self Care | Payer: Medicare Other | Source: Ambulatory Visit | Attending: Nurse Practitioner | Admitting: Nurse Practitioner

## 2016-04-19 ENCOUNTER — Telehealth: Payer: Self-pay | Admitting: Internal Medicine

## 2016-04-19 ENCOUNTER — Encounter (HOSPITAL_COMMUNITY): Payer: Self-pay | Admitting: Nurse Practitioner

## 2016-04-19 VITALS — BP 186/82 | HR 70 | Ht 68.0 in | Wt 149.0 lb

## 2016-04-19 DIAGNOSIS — I480 Paroxysmal atrial fibrillation: Secondary | ICD-10-CM | POA: Insufficient documentation

## 2016-04-19 DIAGNOSIS — K219 Gastro-esophageal reflux disease without esophagitis: Secondary | ICD-10-CM | POA: Diagnosis not present

## 2016-04-19 DIAGNOSIS — I10 Essential (primary) hypertension: Secondary | ICD-10-CM | POA: Insufficient documentation

## 2016-04-19 DIAGNOSIS — E7800 Pure hypercholesterolemia, unspecified: Secondary | ICD-10-CM | POA: Insufficient documentation

## 2016-04-19 DIAGNOSIS — R002 Palpitations: Secondary | ICD-10-CM | POA: Diagnosis not present

## 2016-04-19 DIAGNOSIS — F419 Anxiety disorder, unspecified: Secondary | ICD-10-CM | POA: Insufficient documentation

## 2016-04-19 MED ORDER — DILTIAZEM HCL 30 MG PO TABS: ORAL_TABLET | ORAL | 1 refills | Status: DC

## 2016-04-19 NOTE — Telephone Encounter (Signed)
[name] called the after hours line due to his heart rate feeling like it is skipping a beat.  He has a known history of paroxysmal atrial fibrillation.  He does not have any other symptoms such as feeling lightheaded, dizzy, or feeling like he is going to faint.  I informed patient that he should call the cardiology office during normal hours and request to be seen in the atrial fibrillation clinic.    Liborio Nixon, MD

## 2016-04-19 NOTE — Patient Instructions (Signed)
Your physician has recommended you make the following change in your medication:  1)Cardizem 30mg  -- take 1 tablet every 4 hours AS NEEDED for AFIB heart rate >100 -- check your blood pressure and make sure the top number is above 100 before taking.

## 2016-04-19 NOTE — Telephone Encounter (Signed)
Patient called in today after speaking with on call physician during the early AM for further advice. After talking with patient he states his heart was "flopping and pausing" sounding to be afib. He states his heart rate was elevated but nothing like what took him to the ER before. He is back to normal rhythm now he says feeling much better. Reassured patient that afib can breakthrough - he has been on a prednisone taper for toe pain since 1/28 and this is probably the culprit of the breakthrough afib. Instructed pt if he should experience these symptoms again he should check his heart rate and blood pressure. If his HR is over 100 and SBP is over 100 he could take an extra cardizem in the evening (since he takes his normal cardizem dose in the morning). Encouraged pt to call back if he continues to have frequent episodes of AFib and we will bring him in for office visit. Pt was very appreciative of call and information given and felt more comfortable as to what was going on. Pt will call if further issues or concerns arise.

## 2016-04-19 NOTE — Progress Notes (Signed)
Primary Care Physician: Glenda Chroman, MD Referring Physician: Self referred Cardiologist: Dr. Vivien Parker is a 71 y.o. male with a h/o palpitations for some time but diagnosed with afib 03/15/16 when he presented to the ER 03/15/16. He was started on  IV diltiazem and converted. He was discharged on diltiazem and eliquis with a CHA2DS2VASc score of 2(age/htn).  He called the doctor on call last pm for fluttering that was persistent thru the night . He was asked to call the afib clinic this am. He was in rhythm when he called and reported that he had just finished a prednisone taper for inflamed toe. He noted a few more palpitations this am and decided that he would walk in and ask to be seen.  He is in SR but very anxious re afib and it's consequences. He does not smoke, drink alcohol or extra caffeine. He does not snore. He is not overweight.  Today, he denies symptoms of  chest pain, shortness of breath, orthopnea, PND, lower extremity edema, dizziness, presyncope, syncope, or neurologic sequela.  Recent palpitations.The patient is tolerating medications without difficulties and is otherwise without complaint today.   Past Medical History:  Diagnosis Date  . Anxiety   . 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 Chest pain    myoview 07/09/12-normal, nl ef; echo 04/12/05-ef>55%, mild aortic sclerosis  . GERD (gastroesophageal reflux disease)   . H. pylori infection   . Headache   . Hypercholesteremia   . Hyperlipidemia   . Hypertension   . Palpitations    event monior 04/11/05- SR with occ PAC  . Personal history of colonic polyps 2 9 2007   Past Surgical History:  Procedure Laterality Date  . BACK SURGERY    . CATARACT EXTRACTION W/PHACO Left 12/08/2012   Procedure: CATARACT EXTRACTION PHACO AND INTRAOCULAR LENS PLACEMENT (IOC);  Surgeon: Tonny Branch, MD;  Location: [facility];  Service: Ophthalmology;  Laterality: Left;  CDE:  23.19  . CATARACT EXTRACTION W/PHACO Right 04/15/2014     Procedure: CATARACT EXTRACTION PHACO AND INTRAOCULAR LENS PLACEMENT RIGHT;  Surgeon: Tonny Branch, MD;  Location: [facility];  Service: Ophthalmology;  Laterality: Right;  CDE:10.32  . COLONOSCOPY  12/06/2010   Procedure: COLONOSCOPY;  Surgeon: Rogene Houston, MD;  Location[facility];  Service: Endoscopy;  Laterality: N/A;  . ESOPHAGOGASTRODUODENOSCOPY N/A 07/20/2015   Procedure: ESOPHAGOGASTRODUODENOSCOPY (EGD);  Surgeon: Rogene Houston, MD;  Location[facility];  Service: Endoscopy;  Laterality: N/A;  2:55 - moved to 1:55 - Ann notified pt  . EYE SURGERY    . LUMBAR DISC SURGERY Left 2007   "L4-5 ruptured discs"    Current Outpatient Prescriptions  Medication Sig Dispense Refill  . apixaban (ELIQUIS) 5 MG TABS tablet Take 5 mg by mouth 2 (two) times daily.    Marland Kitchen atorvastatin (LIPITOR) 10 MG tablet Take 10 mg by mouth daily at 6 PM.     . diltiazem (CARDIZEM CD) 180 MG 24 hr capsule Take 1 capsule (180 mg total) by mouth daily. 30 capsule 12  . HYDROcodone-acetaminophen (NORCO/VICODIN) 5-325 MG tablet Take 1 tablet by mouth every 4 (four) hours as needed. 10 tablet 0  . diltiazem (CARDIZEM) 30 MG tablet Take 1 tablet every 4 hours AS NEEDED for afib heart rate >100 45 tablet 1   No current facility-administered medications for this encounter.     Allergies  Allergen Reactions  . Lisinopril Swelling  H/o of angioedema   . Chlorthalidone Other (See Comments)    Causes severe hyponatremia  . Hyoscyamine Swelling  . Prevacid [Lansoprazole] Swelling  . Valium [Diazepam]     "makes me feel crazy"    Social History   Social History  . Marital status: Married    Spouse name: N/A  . Number of children: N/A  . Years of education: N/A   Occupational History  . Not on file.   Social History Main Topics  . Smoking status: Never Smoker  . Smokeless tobacco: Never Used  . Alcohol use No  . Drug use: No  . Sexual activity: Not on file   Other Topics Concern  . Not on file    Social History Narrative   ** Merged History Encounter **        Family History  Problem Relation Age of Onset  . Hypertension Father   . Hyperlipidemia Father     ROS- All systems are reviewed and negative except as per the HPI above  Physical Exam: Vitals:   04/19/16 1318  BP: (!) 186/82  Pulse: 70  Weight: 149 lb (67.6 kg)  Height: 5\' 8"  (1.727 m)    GEN- The patient is well appearing, alert and oriented x 3 today.   Head- normocephalic, atraumatic Eyes-  Sclera clear, conjunctiva pink Ears- hearing intact Oropharynx- clear Neck- supple, no JVP Lymph- no cervical lymphadenopathy Lungs- Clear to ausculation bilaterally, normal work of breathing Heart- Regular rate and rhythm, no murmurs, rubs or gallops, PMI not laterally displaced GI- soft, NT, ND, + BS Extremities- no clubbing, cyanosis, or edema MS- no significant deformity or atrophy Skin- no rash or lesion Psych- euthymic mood, full affect Neuro- strength and sensation are intact  EKG- NSR, normal EKG, pr int, 158 ms, qrs int 88 ms, qtc 434 ms Epic records reviewed Echo-Study Conclusions  - Left ventricle: The cavity size was normal. There was moderate   concentric hypertrophy. Systolic function was normal. The   estimated ejection fraction was in the range of 60% to 65%. Wall   motion was normal; there were no regional wall motion   abnormalities. Doppler parameters are consistent with abnormal   left ventricular relaxation (grade 1 diastolic dysfunction).   There was no evidence of elevated ventricular filling pressure by   Doppler parameters. - Aortic valve: Trileaflet; normal thickness leaflets. There was no   regurgitation. - Aortic root: The aortic root was normal in size. - Ascending aorta: The ascending aorta was normal in size. - Mitral valve: Structurally normal valve. There was no   regurgitation. - Right ventricle: The cavity size was normal. Wall thickness was   normal. Systolic  function was normal. - Right atrium: The atrium was normal in size. - Tricuspid valve: There was trivial regurgitation. - Pulmonary arteries: Systolic pressure was within the normal   range. - Inferior vena cava: The vessel was normal in size. - Pericardium, extracardiac: There was no pericardial effusion.     Assessment and Plan: 1. PAF Pt is in SR but had some palpitations last night and became anxious General education re afib and when appropriate to go to the ER Continue diltiazem 180 mg a day Will rx 30 mg Cardizem to use as needed for symptomatic palpitations Continue eliquis   Dr. [name] as scheduled [facility] as needed  [name], [facility] [address] [place], [place] [address] [phone]

## 2016-05-04 ENCOUNTER — Encounter: Payer: Self-pay | Admitting: Cardiovascular Disease

## 2016-05-04 ENCOUNTER — Ambulatory Visit (INDEPENDENT_AMBULATORY_CARE_PROVIDER_SITE_OTHER): Payer: Medicare Other | Admitting: Cardiovascular Disease

## 2016-05-04 DIAGNOSIS — E7800 Pure hypercholesterolemia, unspecified: Secondary | ICD-10-CM | POA: Diagnosis not present

## 2016-05-04 DIAGNOSIS — I480 Paroxysmal atrial fibrillation: Secondary | ICD-10-CM | POA: Diagnosis not present

## 2016-05-04 DIAGNOSIS — I10 Essential (primary) hypertension: Secondary | ICD-10-CM | POA: Diagnosis not present

## 2016-05-04 NOTE — Progress Notes (Signed)
05/04/2016 [name]   12-10-1945  [id number]  Primary Physician Miguel Chroman, MD Primary Cardiologist: Lorretta Harp MD [name]  HPI:  [name] is a 71 year old well-appearing Caucasian male who I saw 03/30/16. He has a history of hypertension and hyperlipidemia. He had atypical chest pain here tone with a negative Myoview/23/14. His pain improved with proton pump inhibition. Because of recurrent chest pain he saw Miguel Henri, PA-C in the office who did obtain a 2-D echo and Myoview perfusion study all normal. His pain has improved again on a PPI suggesting that it was related to GERD. Since I saw [name] in the office a year ago he has been well until this past Christmas when he was admitted to [facility] with A. fib with RVR. He converted to normal sinus rhythm with IV diltiazem and was converted to by mouth Cardizem. His troponins were negative and 2-D echo was normal. Because of his elevated CHA2DsVASC2 score of 2 he was begun on Eliquis oral anticoagulation. He saw [name] in the A. fib clinic 04/19/16 because of palpitations. He says he occasionally feels his heart skip or missed AB. [name] also had a more prolonged episode of PAF lasting 20 seconds. [name] gave him when necessary diltiazem 30 mg a day for persistently elevated heart rate.   Current Outpatient Prescriptions  Medication Sig Dispense Refill  . apixaban (ELIQUIS) 5 MG TABS tablet Take 5 mg by mouth 2 (two) times daily.    Marland Kitchen atorvastatin (LIPITOR) 10 MG tablet Take 10 mg by mouth daily at 6 PM.     . diltiazem (CARDIZEM CD) 180 MG 24 hr capsule Take 1 capsule (180 mg total) by mouth daily. 30 capsule 12  . diltiazem (CARDIZEM) 30 MG tablet Take 1 tablet every 4 hours AS NEEDED for afib heart rate >100 45 tablet 1  . HYDROcodone-acetaminophen (NORCO/VICODIN) 5-325 MG tablet Take 1 tablet by mouth every 4 (four) hours as needed. 10 tablet 0   No current facility-administered medications  for this visit.     Allergies  Allergen Reactions  . Lisinopril Swelling    H/o of angioedema   . Chlorthalidone Other (See Comments)    Causes severe hyponatremia  . Hyoscyamine Swelling  . Prevacid [Lansoprazole] Swelling  . Valium [Diazepam]     "makes me feel crazy"    Social History   Social History  . Marital status: Married    Spouse name: N/A  . Number of children: N/A  . Years of education: N/A   Occupational History  . Not on file.   Social History Main Topics  . Smoking status: Never Smoker  . Smokeless tobacco: Never Used  . Alcohol use No  . Drug use: No  . Sexual activity: Not on file   Other Topics Concern  . Not on file   Social History Narrative   ** Merged History Encounter **         Review of Systems: General: negative for chills, fever, night sweats or weight changes.  Cardiovascular: negative for chest pain, dyspnea on exertion, edema, orthopnea, palpitations, paroxysmal nocturnal dyspnea or shortness of breath Dermatological: negative for rash Respiratory: negative for cough or wheezing Urologic: negative for hematuria Abdominal: negative for nausea, vomiting, diarrhea, bright red blood per rectum, melena, or hematemesis Neurologic: negative for visual changes, syncope, or dizziness All other systems reviewed and are otherwise negative except as noted above.    Blood pressure Marland Kitchen)  160/84, pulse 74, height 5\' 8"  (1.727 m), weight 149 lb 9.6 oz (67.9 kg).  General appearance: alert and no distress Neck: no adenopathy, no carotid bruit, no JVD, supple, symmetrical, trachea midline and thyroid not enlarged, symmetric, no tenderness/mass/nodules Lungs: clear to auscultation bilaterally Heart: regular rate and rhythm, S1, S2 normal, no murmur, click, rub or gallop Extremities: extremities normal, atraumatic, no cyanosis or edema  EKG not performed today  ASSESSMENT AND PLAN:   Hyperlipidemia History of hyperlipidemia on statin therapy  with lipid profile performed 12/20/14 revealing total cholesterol 172, LDL 106 and HDL of 46.  HTN (hypertension) History of hypertension blood pressure measured today 160/84. He says he does have "white coat hypertension. He is on Cardizem CD 180 mg a day . Continue current meds at current dosing.  Atrial fibrillation (H[facility]) History of paroxysmal atrial fibrillation on Eliquis oral anticoagulation and Cardizem. He does get breakthrough episodes of PAF which he is very anxious about. He just saw [name] in the A. fib clinic who gave him when necessary diltiazem 30 mg for increased heart rate.      Lorretta Harp MD FACP,FACC,FAHA, [facility] 05/04/2016 11:50 AM

## 2016-05-04 NOTE — Assessment & Plan Note (Signed)
History of hypertension blood pressure measured today 160/84. He says he does have "white coat hypertension. He is on Cardizem CD 180 mg a day . Continue current meds at current dosing.

## 2016-05-04 NOTE — Assessment & Plan Note (Signed)
History of paroxysmal atrial fibrillation on Eliquis oral anticoagulation and Cardizem. He does get breakthrough episodes of PAF which he is very anxious about. He just saw [name] in the A. fib clinic who gave him when necessary diltiazem 30 mg for increased heart rate.

## 2016-05-04 NOTE — Patient Instructions (Signed)

## 2016-05-04 NOTE — Assessment & Plan Note (Signed)
History of hyperlipidemia on statin therapy with lipid profile performed 12/20/14 revealing total cholesterol 172, LDL 106 and HDL of 46.

## 2016-05-09 ENCOUNTER — Encounter (INDEPENDENT_AMBULATORY_CARE_PROVIDER_SITE_OTHER): Payer: Self-pay | Admitting: *Deleted

## 2016-05-21 ENCOUNTER — Other Ambulatory Visit (INDEPENDENT_AMBULATORY_CARE_PROVIDER_SITE_OTHER): Payer: Self-pay | Admitting: *Deleted

## 2016-05-21 DIAGNOSIS — Z86010 Personal history of colonic polyps: Secondary | ICD-10-CM

## 2016-05-23 ENCOUNTER — Encounter (HOSPITAL_COMMUNITY): Payer: Self-pay

## 2016-05-23 ENCOUNTER — Encounter (HOSPITAL_COMMUNITY)
Admission: RE | Disposition: A | Discharge status: Home / Self Care | Payer: Self-pay | Source: Ambulatory Visit | Attending: Internal Medicine

## 2016-05-23 ENCOUNTER — Ambulatory Visit (HOSPITAL_COMMUNITY)
Admission: RE | Admit: 2016-05-23 | Discharge: 2016-05-23 | Disposition: A | Discharge status: Home / Self Care | Payer: Medicare Other | Source: Ambulatory Visit | Attending: Internal Medicine | Admitting: Internal Medicine

## 2016-05-23 DIAGNOSIS — I10 Essential (primary) hypertension: Secondary | ICD-10-CM | POA: Diagnosis not present

## 2016-05-23 DIAGNOSIS — K5730 Diverticulosis of large intestine without perforation or abscess without bleeding: Secondary | ICD-10-CM

## 2016-05-23 DIAGNOSIS — D123 Benign neoplasm of transverse colon: Secondary | ICD-10-CM

## 2016-05-23 DIAGNOSIS — Z7901 Long term (current) use of anticoagulants: Secondary | ICD-10-CM | POA: Insufficient documentation

## 2016-05-23 DIAGNOSIS — E785 Hyperlipidemia, unspecified: Secondary | ICD-10-CM | POA: Diagnosis not present

## 2016-05-23 DIAGNOSIS — Z79899 Other long term (current) drug therapy: Secondary | ICD-10-CM | POA: Insufficient documentation

## 2016-05-23 DIAGNOSIS — I4891 Unspecified atrial fibrillation: Secondary | ICD-10-CM | POA: Diagnosis not present

## 2016-05-23 DIAGNOSIS — Z86010 Personal history of colonic polyps: Secondary | ICD-10-CM

## 2016-05-23 DIAGNOSIS — Z8547 Personal history of malignant neoplasm of testis: Secondary | ICD-10-CM | POA: Insufficient documentation

## 2016-05-23 DIAGNOSIS — K6289 Other specified diseases of anus and rectum: Secondary | ICD-10-CM | POA: Diagnosis not present

## 2016-05-23 DIAGNOSIS — Z1211 Encounter for screening for malignant neoplasm of colon: Secondary | ICD-10-CM | POA: Diagnosis not present

## 2016-05-23 DIAGNOSIS — K644 Residual hemorrhoidal skin tags: Secondary | ICD-10-CM

## 2016-05-23 DIAGNOSIS — D125 Benign neoplasm of sigmoid colon: Secondary | ICD-10-CM

## 2016-05-23 DIAGNOSIS — Z09 Encounter for follow-up examination after completed treatment for conditions other than malignant neoplasm: Secondary | ICD-10-CM | POA: Diagnosis not present

## 2016-05-23 HISTORY — PX: COLONOSCOPY: SHX5424

## 2016-05-23 HISTORY — PX: POLYPECTOMY: SHX5525

## 2016-05-23 SURGERY — COLONOSCOPY
Anesthesia: Moderate Sedation

## 2016-05-23 MED ORDER — MIDAZOLAM HCL 5 MG/5ML IJ SOLN
INTRAMUSCULAR | Status: AC
  Filled 2016-05-23: qty 10

## 2016-05-23 MED ORDER — MEPERIDINE HCL 50 MG/ML IJ SOLN
INTRAMUSCULAR | Status: AC
  Filled 2016-05-23: qty 1

## 2016-05-23 MED ORDER — SPOT INK MARKER SYRINGE KIT
PACK | SUBMUCOSAL | Status: AC
  Filled 2016-05-23: qty 5

## 2016-05-23 MED ORDER — SPOT INK MARKER SYRINGE KIT
PACK | SUBMUCOSAL | Status: DC | PRN
  Administered 2016-05-23: 5 mL via SUBMUCOSAL

## 2016-05-23 MED ORDER — SODIUM CHLORIDE 0.9 % IV SOLN
INTRAVENOUS | Status: DC
  Administered 2016-05-23: 10:00:00 via INTRAVENOUS

## 2016-05-23 MED ORDER — MIDAZOLAM HCL 5 MG/5ML IJ SOLN
INTRAMUSCULAR | Status: DC | PRN
  Administered 2016-05-23: 2 mg via INTRAVENOUS
  Administered 2016-05-23: 3 mg via INTRAVENOUS
  Administered 2016-05-23 (×2): 1 mg via INTRAVENOUS

## 2016-05-23 MED ORDER — MEPERIDINE HCL 50 MG/ML IJ SOLN
INTRAMUSCULAR | Status: DC | PRN
  Administered 2016-05-23 (×2): 25 mg via INTRAVENOUS

## 2016-05-23 MED ORDER — SIMETHICONE 40 MG/0.6ML PO SUSP
ORAL | Status: DC | PRN
  Administered 2016-05-23: 11:00:00

## 2016-05-23 NOTE — H&P (Signed)
[name] is an 71 y.o. male.   Chief Complaint: Patient is here for colonoscopy. HPI: Patient is 71 year old Caucasian male who has history of colonic adenomas and is here for surveillance colonoscopy. He denies abdominal pain change in bowel habits or rectal bleeding. Last colonoscopy was over 5 years ago with removal of 2 small polyps and these were tubular adenomas. Family history is negative for CRC.  Past Medical History:  Diagnosis Date  . Anxiety   . 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 Chest pain    myoview 07/09/12-normal, nl ef; echo 04/12/05-ef>55%, mild aortic sclerosis  . GERD (gastroesophageal reflux disease)   . H. pylori infection   . Atrial fibrillation.    .    . Hyperlipidemia   . Hypertension   .       Marland Kitchen Personal history of colonic polyps 2 9 2007    Past Surgical History:  Procedure Laterality Date  . BACK SURGERY    . CATARACT EXTRACTION W/PHACO Left 12/08/2012   Procedure: CATARACT EXTRACTION PHACO AND INTRAOCULAR LENS PLACEMENT (IOC);  Surgeon: Tonny Branch, MD;  Location: [facility];  Service: Ophthalmology;  Laterality: Left;  CDE:  23.19  . CATARACT EXTRACTION W/PHACO Right 04/15/2014   Procedure: CATARACT EXTRACTION PHACO AND INTRAOCULAR LENS PLACEMENT RIGHT;  Surgeon: Tonny Branch, MD;  Location: [facility];  Service: Ophthalmology;  Laterality: Right;  CDE:10.32  . COLONOSCOPY  12/06/2010   Procedure: COLONOSCOPY;  Surgeon: Rogene Houston, MD;  Location: [facility];  Service: Endoscopy;  Laterality: N/A;  . ESOPHAGOGASTRODUODENOSCOPY N/A 07/20/2015   Procedure: ESOPHAGOGASTRODUODENOSCOPY (EGD);  Surgeon: Rogene Houston, MD;  Location: [facility];  Service: Endoscopy;  Laterality: N/A;  2:55 - moved to 1:55 - Ann notified pt  . EYE SURGERY    . LUMBAR DISC SURGERY Left 2007   "L4-5 ruptured discs"    Family History  Problem Relation Age of Onset  . Hypertension Father   . Hyperlipidemia Father    Social History:  reports that he has never smoked. He has  never used smokeless tobacco. He reports that he does not drink alcohol or use drugs.  Allergies:  Allergies  Allergen Reactions  . Lisinopril Swelling    H/o of angioedema   . Chlorthalidone Other (See Comments)    Causes severe hyponatremia  . Hyoscyamine Swelling  . Prevacid [Lansoprazole] Swelling  . Valium [Diazepam]     "makes me feel crazy"    Medications Prior to Admission  Medication Sig Dispense Refill  . atorvastatin (LIPITOR) 10 MG tablet Take 10 mg by mouth daily at 6 PM.     . diltiazem (CARDIZEM CD) 180 MG 24 hr capsule Take 1 capsule (180 mg total) by mouth daily. 30 capsule 12  . diltiazem (CARDIZEM) 30 MG tablet Take 1 tablet every 4 hours AS NEEDED for afib heart rate >100 45 tablet 1  . HYDROcodone-acetaminophen (NORCO/VICODIN) 5-325 MG tablet Take 1 tablet by mouth every 4 (four) hours as needed. 10 tablet 0  . apixaban (ELIQUIS) 5 MG TABS tablet Take 5 mg by mouth 2 (two) times daily.      No results found for this or any previous visit (from the past 48 hour(s)). No results found.  ROS  Blood pressure (!) 192/101, pulse 98, temperature 98.7 F (37.1 C), temperature source Oral, resp. rate 18, SpO2 99 %. Physical Exam  Constitutional: He appears well-developed and well-nourished.  HENT:  Mouth/Throat: Oropharynx is clear and  moist.  Eyes: Conjunctivae are normal. No scleral icterus.  Neck: No thyromegaly present.  Cardiovascular: Normal rate, regular rhythm and normal heart sounds.   No murmur heard. Respiratory: Effort normal and breath sounds normal.  GI: Soft. He exhibits no distension and no mass. There is no tenderness.  Musculoskeletal: He exhibits no edema.  Lymphadenopathy:    He has no cervical adenopathy.  Neurological: He is alert.  Skin: Skin is warm and dry.     Assessment/Plan History of colonic adenomas. Surveillance colonoscopy.  Hildred Laser, MD 05/23/2016, 10:49 AM

## 2016-05-23 NOTE — Op Note (Signed)
[facility] Patient Name: [name] Procedure Date: 05/23/2016 10:27 AM MRN: [id number] Date of Birth: 11-Mar-1946 Attending MD: Hildred Laser , MD CSN: [id number] Age: 71 Admit Type: Outpatient Procedure:                Colonoscopy Indications:              High risk colon cancer surveillance: Personal                            history of colonic polyps Providers:                Hildred Laser, MD, Otis Peak B. Sharon Seller, RN, Aram Candela Referring MD:             Glenda Chroman, MD Medicines:                Meperidine 50 mg IV, Midazolam 7 mg IV Complications:            No immediate complications. Estimated Blood Loss:     Estimated blood loss was minimal. Procedure:                Pre-Anesthesia Assessment:                           - Prior to the procedure, a History and Physical                            was performed, and patient medications and                            allergies were reviewed. The patient's tolerance of                            previous anesthesia was also reviewed. The risks                            and benefits of the procedure and the sedation                            options and risks were discussed with the patient.                            All questions were answered, and informed consent                            was obtained. Prior Anticoagulants: The patient                            last took Eliquis (apixaban) 3 days prior to the                            procedure. ASA Grade Assessment: II - A patient  with mild systemic disease. After reviewing the                            risks and benefits, the patient was deemed in                            satisfactory condition to undergo the procedure.                           After obtaining informed consent, the colonoscope                            was passed under direct vision. Throughout the                            procedure,  the patient's blood pressure, pulse, and                            oxygen saturations were monitored continuously. The                            EC-[id number] ([id number]) scope was introduced through                            the anus and advanced to the the cecum, identified                            by appendiceal orifice and ileocecal valve. The                            colonoscopy was performed without difficulty. The                            patient tolerated the procedure well. The quality                            of the bowel preparation was excellent. The                            ileocecal valve, appendiceal orifice, and rectum                            were photographed. Scope In: 10:59:39 AM Scope Out: 11:43:01 AM Scope Withdrawal Time: 0 hours 31 minutes 49 seconds  Total Procedure Duration: 0 hours 43 minutes 22 seconds  Findings:      The perianal and digital rectal examinations were normal.      A 4 to 6 mm polyp was found in the hepatic flexure. The polyp was       sessile. The polyp was removed with a cold snare. Resection and       retrieval were complete. The pathology specimen was placed into Bottle       Number 2.      A 6 to 8 mm polyp was found in the proximal transverse colon. The polyp  was multi-lobulated. The polyp was removed with a cold snare. Resection       and retrieval were complete. The pathology specimen was placed into       Bottle Number 1. To stop active bleeding, two hemostatic clips were       successfully placed (MR conditional). There was no bleeding at the end       of the procedure.      A 5 to 8 mm polyp was found in the mid sigmoid colon. The polyp was       carpet-like with irregular shape.. The polyp was removed with a hot       snare. Resection and retrieval were complete. To prevent bleeding after       the polypectomy, one hemostatic clip was successfully placed (MR       conditional). There was no bleeding at the end of the  procedure. Area       was tattooed with an injection of 5 mL of Spot (carbon black). The       pathology specimen was placed into Bottle Number 3.      A few small-mouthed diverticula were found in the sigmoid colon.      External hemorrhoids were found during retroflexion. The hemorrhoids       were small.      Anal papilla(e) were hypertrophied. Impression:               - One 4 to 6 mm polyp at the hepatic flexure,                            removed with a cold snare. Resected and retrieved.                           - One 6 to 8 mm polyp in the proximal transverse                            colon, removed with a cold snare. Resected and                            retrieved. Clips (MR conditional) were placed.                           - One 5 to 8 mm polyp in the mid sigmoid colon,                            removed with a hot snare. Resected and retrieved.                            Clip (MR conditional) was placed. Tattooed.                           - Diverticulosis in the sigmoid colon.                           - External hemorrhoids.                           - Single smali papilla. Moderate  Sedation:      Moderate (conscious) sedation was administered by the endoscopy nurse       and supervised by the endoscopist. The following parameters were       monitored: oxygen saturation, heart rate, blood pressure, CO2       capnography and response to care. Total physician intraservice time was       49 minutes. Recommendation:           - Patient has a contact number available for                            emergencies. The signs and symptoms of potential                            delayed complications were discussed with the                            patient. Return to normal activities tomorrow.                            Written discharge instructions were provided to the                            patient.                           - Continue present medications.                            - Resume Eliquis (apixaban) at prior dose in 2 days.                           - Await pathology results.                           - Repeat colonoscopy for surveillance based on                            pathology results.                           - Resume previous diet. Procedure Code(s):        --- Professional ---                           [phone], Colonoscopy, flexible; with removal of                            tumor(s), polyp(s), or other lesion(s) by snare                            technique                           86754, Colonoscopy, flexible; with directed  submucosal injection(s), any substance                           99152, Moderate sedation services provided by the                            same physician or other qualified health care                            professional performing the diagnostic or                            therapeutic service that the sedation supports,                            requiring the presence of an independent trained                            observer to assist in the monitoring of the                            patient's level of consciousness and physiological                            status; initial 15 minutes of intraservice time,                            patient age 20 years or older                           [phone], Moderate sedation services; each additional                            15 minutes intraservice time                           99153, Moderate sedation services; each additional                            15 minutes intraservice time Diagnosis Code(s):        --- Professional ---                           Z86.010, Personal history of colonic polyps                           D12.3, Benign neoplasm of transverse colon (hepatic                            flexure or splenic flexure)                           D12.5, Benign neoplasm of sigmoid colon                           K64.4,  Residual  hemorrhoidal skin tags                           K62.89, Other specified diseases of anus and rectum                           K57.30, Diverticulosis of large intestine without                            perforation or abscess without bleeding CPT copyright 2016 American [employer]. All rights reserved. The codes documented in this report are preliminary and upon coder review may  be revised to meet current compliance requirements. Hildred Laser, MD Hildred Laser, MD 05/23/2016 12:04:57 PM This report has been signed electronically. Number of Addenda: 0

## 2016-05-23 NOTE — Discharge Instructions (Signed)
No aspirin or NSAIDs for 1 week. Resume Apixaban on 05/25/2016. Resume other medications and diet as before. No driving for 24 hours. Physician will call with biopsy results.    Colon Polyps Polyps are tissue growths inside the body. Polyps can grow in many places, including the large intestine (colon). A polyp may be a round bump or a mushroom-shaped growth. You could have one polyp or several. Most colon polyps are noncancerous (benign). However, some colon polyps can become cancerous over time. What are the causes? The exact cause of colon polyps is not known. What increases the risk? This condition is more likely to develop in people who:  Have a family history of colon cancer or colon polyps.  Are older than 47 or older than 45 if they are African American.  Have inflammatory bowel disease, such as ulcerative colitis or Crohn disease.  Are overweight.  Smoke cigarettes.  Do not get enough exercise.  Drink too much alcohol.  Eat a diet that is:  High in fat and red meat.  Low in fiber.  Had childhood cancer that was treated with abdominal radiation. What are the signs or symptoms? Most polyps do not cause symptoms. If you have symptoms, they may include:  Blood coming from your rectum when having a bowel movement.  Blood in your stool.The stool may look dark red or black.  A change in bowel habits, such as constipation or diarrhea. How is this diagnosed? This condition is diagnosed with a colonoscopy. This is a procedure that uses a lighted, flexible scope to look at the inside of your colon. How is this treated? Treatment for this condition involves removing any polyps that are found. Those polyps will then be tested for cancer. If cancer is found, your health care provider will talk to you about options for colon cancer treatment. Follow these instructions at home: Diet   Eat plenty of fiber, such as fruits, vegetables, and whole grains.  Eat foods that  are high in calcium and vitamin D, such as milk, cheese, yogurt, eggs, liver, fish, and broccoli.  Limit foods high in fat, red meats, and processed meats, such as hot dogs, sausage, bacon, and lunch meats.  Maintain a healthy weight, or lose weight if recommended by your health care provider. General instructions   Do not smoke cigarettes.  Do not drink alcohol excessively.  Keep all follow-up visits as told by your health care provider. This is important. This includes keeping regularly scheduled colonoscopies. Talk to your health care provider about when you need a colonoscopy.  Exercise every day or as told by your health care provider. Contact a health care provider if:  You have new or worsening bleeding during a bowel movement.  You have new or increased blood in your stool.  You have a change in bowel habits.  You unexpectedly lose weight. This information is not intended to replace advice given to you by your health care provider. Make sure you discuss any questions you have with your health care provider. Document Released: 11/30/2003 Document Revised: 08/11/2015 Document Reviewed: 01/24/2015 Elsevier Interactive Patient Education  2017 [employer].  Colonoscopy, Adult, Care After This sheet gives you information about how to care for yourself after your procedure. Your health care provider may also give you more specific instructions. If you have problems or questions, contact your health care provider. What can I expect after the procedure? After the procedure, it is common to have:  A small amount of blood in  your stool for 24 hours after the procedure.  Some gas.  Mild abdominal cramping or bloating. Follow these instructions at home: General instructions    For the first 24 hours after the procedure:  Do not drive or use machinery.  Do not sign important documents.  Do not drink alcohol.  Do your regular daily activities at a slower pace than  normal.  Eat soft, easy-to-digest foods.  Rest often.  Take over-the-counter or prescription medicines only as told by your health care provider.  It is up to you to get the results of your procedure. Ask your health care provider, or the department performing the procedure, when your results will be ready. Relieving cramping and bloating   Try walking around when you have cramps or feel bloated.  Apply heat to your abdomen as told by your health care provider. Use a heat source that your health care provider recommends, such as a moist heat pack or a heating pad.  Place a towel between your skin and the heat source.  Leave the heat on for 20-30 minutes.  Remove the heat if your skin turns bright red. This is especially important if you are unable to feel pain, heat, or cold. You may have a greater risk of getting burned. Eating and drinking   Drink enough fluid to keep your urine clear or pale yellow.  Resume your normal diet as instructed by your health care provider. Avoid heavy or fried foods that are hard to digest.  Avoid drinking alcohol for as long as instructed by your health care provider. Contact a health care provider if:  You have blood in your stool 2-3 days after the procedure. Get help right away if:  You have more than a small spotting of blood in your stool.  You pass large blood clots in your stool.  Your abdomen is swollen.  You have nausea or vomiting.  You have a fever.  You have increasing abdominal pain that is not relieved with medicine. This information is not intended to replace advice given to you by your health care provider. Make sure you discuss any questions you have with your health care provider. Document Released: 10/18/2003 Document Revised: 11/28/2015 Document Reviewed: 05/17/2015 Elsevier Interactive Patient Education  2017 [employer].

## 2016-05-28 ENCOUNTER — Encounter (HOSPITAL_COMMUNITY): Payer: Self-pay | Admitting: Internal Medicine

## 2016-06-22 ENCOUNTER — Telehealth: Payer: Self-pay | Admitting: Cardiology

## 2016-06-22 NOTE — Telephone Encounter (Signed)
Pt called for BP 163/83 and headache.   He will take his dilt now and tylenol for headache if BP remains up and headache remain he will call PCP.

## 2016-06-29 ENCOUNTER — Emergency Department (HOSPITAL_COMMUNITY)
Admission: EM | Admit: 2016-06-29 | Discharge: 2016-06-29 | Disposition: A | Discharge status: Home / Self Care | Payer: Medicare Other | Attending: Emergency Medicine | Admitting: Emergency Medicine

## 2016-06-29 ENCOUNTER — Emergency Department (HOSPITAL_COMMUNITY): Payer: Medicare Other

## 2016-06-29 ENCOUNTER — Encounter (HOSPITAL_COMMUNITY): Payer: Self-pay | Admitting: Emergency Medicine

## 2016-06-29 DIAGNOSIS — G44209 Tension-type headache, unspecified, not intractable: Secondary | ICD-10-CM | POA: Diagnosis not present

## 2016-06-29 DIAGNOSIS — I10 Essential (primary) hypertension: Secondary | ICD-10-CM | POA: Diagnosis not present

## 2016-06-29 DIAGNOSIS — Z79899 Other long term (current) drug therapy: Secondary | ICD-10-CM | POA: Insufficient documentation

## 2016-06-29 DIAGNOSIS — Z8547 Personal history of malignant neoplasm of testis: Secondary | ICD-10-CM | POA: Insufficient documentation

## 2016-06-29 DIAGNOSIS — R51 Headache: Secondary | ICD-10-CM | POA: Diagnosis present

## 2016-06-29 HISTORY — DX: Hypo-osmolality and hyponatremia: E87.1

## 2016-06-29 LAB — CBC WITH DIFFERENTIAL/PLATELET
BASOS ABS: 0.1 10*3/uL (ref 0.0–0.1)
BASOS PCT: 1 %
EOS PCT: 2 %
Eosinophils Absolute: 0.2 10*3/uL (ref 0.0–0.7)
HEMATOCRIT: 41.4 % (ref 39.0–52.0)
Hemoglobin: 13.7 g/dL (ref 13.0–17.0)
Lymphocytes Relative: 21 %
Lymphs Abs: 1.5 10*3/uL (ref 0.7–4.0)
MCH: 29.3 pg (ref 26.0–34.0)
MCHC: 33.1 g/dL (ref 30.0–36.0)
MCV: 88.7 fL (ref 78.0–100.0)
MONO ABS: 0.6 10*3/uL (ref 0.1–1.0)
MONOS PCT: 8 %
NEUTROS ABS: 5.1 10*3/uL (ref 1.7–7.7)
Neutrophils Relative %: 68 %
PLATELETS: 265 10*3/uL (ref 150–400)
RBC: 4.67 MIL/uL (ref 4.22–5.81)
RDW: 13.3 % (ref 11.5–15.5)
WBC: 7.5 10*3/uL (ref 4.0–10.5)

## 2016-06-29 LAB — BASIC METABOLIC PANEL
ANION GAP: 5 (ref 5–15)
BUN: 8 mg/dL (ref 6–20)
CALCIUM: 9 mg/dL (ref 8.9–10.3)
CO2: 29 mmol/L (ref 22–32)
Chloride: 103 mmol/L (ref 101–111)
Creatinine, Ser: 0.73 mg/dL (ref 0.61–1.24)
GLUCOSE: 118 mg/dL — AB (ref 65–99)
Potassium: 3.9 mmol/L (ref 3.5–5.1)
Sodium: 137 mmol/L (ref 135–145)

## 2016-06-29 LAB — TSH: TSH: 3.047 u[IU]/mL (ref 0.350–4.500)

## 2016-06-29 MED ORDER — ACETAMINOPHEN 325 MG PO TABS
650.0000 mg | ORAL_TABLET | Freq: Once | ORAL | Status: AC
  Administered 2016-06-29: 650 mg via ORAL
  Filled 2016-06-29: qty 2

## 2016-06-29 NOTE — ED Provider Notes (Signed)
[facility] DEPT Provider Note   CSN: [id number] Arrival date & time: 06/29/16  0858     History   Chief Complaint Chief Complaint  Patient presents with  . Headache    HPI [name] is a 71 y.o. male with a history of multiple medical conditions including hyponatremia, borderline DM and a fibrillation treated with eliquis with an intermittent throbbing generalized headache present for the past week.  In addition, he endorses increased thirst and increased urinary frequency which really started about the time of his last screening colonoscopy early March and wonders if the bowel prep affected his sodium in some way.  He also feels intermittently "fussy headed" which will improve after eating a snack.  He denies dizziness, vision changes, photophobia peripheral weakness, edema or focal weakness. He also denies n/v, fevers, chest pain or sob and has had no palpitations.  He has taken tylenol with sometimes headache relief, last dose 250 mg (breaks in 1/2) taken at 2 am without relief.    The history is provided by the patient.    Past Medical History:  Diagnosis Date  . Anxiety   . 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 Chest pain    myoview 07/09/12-normal, nl ef; echo 04/12/05-ef>55%, mild aortic sclerosis  . GERD (gastroesophageal reflux disease)   . H. pylori infection   . Headache   . Hypercholesteremia   . Hyperlipidemia   . Hypertension   . Hyponatremia 2013  . Palpitations    event monior 04/11/05- SR with occ PAC  . Personal history of colonic polyps 2 9 2007    Patient Active Problem List   Diagnosis Date Noted  . Atrial fibrillation ([place]) 03/13/2016  . Hx of colonic polyps 12/15/2015  . Palpitations 02/25/2015  . Lumbago 01/01/2013  . Chest pain 08/08/2012  . HTN (hypertension) 08/08/2012  . Diarrhea 10/23/2010  . Epigastric pain 10/23/2010  . Anxiety   . Hyperlipidemia   . GERD (gastroesophageal reflux disease)   . COLONIC POLYPS 11/23/2008  . GERD  11/23/2008  . DYSPEPSIA, CHRONIC 11/23/2008  . IBS 11/23/2008  . LOSS OF APPETITE 11/23/2008  . EPIGASTRIC PAIN 11/23/2008  . HEARTBURN, HX OF 11/23/2008    Past Surgical History:  Procedure Laterality Date  . BACK SURGERY    . CATARACT EXTRACTION W/PHACO Left 12/08/2012   Procedure: CATARACT EXTRACTION PHACO AND INTRAOCULAR LENS PLACEMENT (IOC);  Surgeon: Tonny Branch, MD;  Location: [facility];  Service: Ophthalmology;  Laterality: Left;  CDE:  23.19  . CATARACT EXTRACTION W/PHACO Right 04/15/2014   Procedure: CATARACT EXTRACTION PHACO AND INTRAOCULAR LENS PLACEMENT RIGHT;  Surgeon: Tonny Branch, MD;  Location: [facility];  Service: Ophthalmology;  Laterality: Right;  CDE:10.32  . COLONOSCOPY  12/06/2010   Procedure: COLONOSCOPY;  Surgeon: Rogene Houston, MD;  Location: [facility];  Service: Endoscopy;  Laterality: N/A;  . COLONOSCOPY N/A 05/23/2016   Procedure: COLONOSCOPY;  Surgeon: Rogene Houston, MD;  [facility]: [facility];  Service: Endoscopy;  Laterality: N/A;  930  . ESOPHAGOGASTRODUODENOSCOPY N/A 07/20/2015   Procedure: ESOPHAGOGASTRODUODENOSCOPY (EGD);  Surgeon: Rogene Houston, MD;  [facility];  Service: Endoscopy;  Laterality: N/A;  2:55 - moved to 1:55 - Ann notified pt  . EYE SURGERY    . LUMBAR DISC SURGERY Left 2007   "L4-5 ruptured discs"  . POLYPECTOMY  05/23/2016   Procedure: POLYPECTOMY;  Surgeon: Rogene Houston, MD;  [facility]: [facility];  Service: Endoscopy;;  colon       Home Medications    Prior to Admission medications   Medication Sig Start Date End Date Taking? Authorizing Provider  apixaban (ELIQUIS) 5 MG TABS tablet Take 1 tablet (5 mg total) by mouth 2 (two) times daily. 05/25/16   Rogene Houston, MD  atorvastatin (LIPITOR) 10 MG tablet Take 10 mg by mouth daily at 6 PM.     Historical Provider, MD  diltiazem (CARDIZEM CD) 180 MG 24 hr capsule Take 1 capsule (180 mg total) by mouth daily. 03/16/16   Arbutus Leas, NP  diltiazem (CARDIZEM) 30 MG  tablet Take 1 tablet every 4 hours AS NEEDED for afib heart rate >100 04/19/16   Sherran Needs, NP  HYDROcodone-acetaminophen (NORCO/VICODIN) 5-325 MG tablet Take 1 tablet by mouth every 4 (four) hours as needed. 04/15/16   Larene Pickett, PA-C    Family History Family History  Problem Relation Age of Onset  . Hypertension Father   . Hyperlipidemia Father     Social History Social History  Substance Use Topics  . Smoking status: Never Smoker  . Smokeless tobacco: Never Used  . Alcohol use No     Allergies   Lisinopril; Chlorthalidone; Hyoscyamine; Prevacid [lansoprazole]; and Valium [diazepam]   Review of Systems Review of Systems  Constitutional: Negative for fever.  HENT: Negative for congestion and sore throat.   Eyes: Negative.  Negative for visual disturbance.  Respiratory: Negative for chest tightness and shortness of breath.   Cardiovascular: Negative for chest pain.  Gastrointestinal: Negative for abdominal pain, nausea and vomiting.  Genitourinary: Negative.   Musculoskeletal: Negative for arthralgias, joint swelling and neck pain.  Skin: Negative.  Negative for rash and wound.  Neurological: Positive for headaches. Negative for dizziness, facial asymmetry, speech difficulty, weakness, light-headedness and numbness.  Psychiatric/Behavioral: Positive for decreased concentration. Negative for confusion.     Physical Exam Updated Vital Signs BP (!) 167/74 (BP Location: Right Arm)   Pulse 72   Temp 97.9 F (36.6 C) (Oral)   Resp 20   Ht 5\' 8"  (1.727 m)   Wt 68.9 kg   SpO2 99%   BMI 23.11 kg/m   Physical Exam  Constitutional: He is oriented to person, place, and time. He appears well-developed and well-nourished.  Appears comfortable.  Anxious.  Frequently repeats his history.  HENT:  Head: Normocephalic and atraumatic.  Mouth/Throat: Oropharynx is clear and moist.  Eyes: EOM are normal. Pupils are equal, round, and reactive to light.  Neck: Normal range  of motion. Neck supple.  Cardiovascular: Normal rate and normal heart sounds.  Exam reveals no gallop.   No murmur heard. Pulmonary/Chest: Effort normal.  Abdominal: Soft. He exhibits no distension. There is no tenderness.  Musculoskeletal: Normal range of motion.  Lymphadenopathy:    He has no cervical adenopathy.  Neurological: He is alert and oriented to person, place, and time. He has normal strength. No sensory deficit. Gait normal. GCS eye subscore is 4. GCS verbal subscore is 5. GCS motor subscore is 6.  Normal heel-shin, Cranial nerves III-XII intact.  No pronator drift. Equal grip strength.  Skin: Skin is warm and dry. No rash noted.  Psychiatric: He has a normal mood and affect. His speech is normal and behavior is normal. Thought content normal. Cognition and memory are normal.  Nursing note and vitals reviewed.    ED Treatments / Results  Labs (all labs ordered are listed, but only abnormal results are displayed) Labs Reviewed  BASIC METABOLIC PANEL - Abnormal; Notable for the following:       Result Value   Glucose, Bld 118 (*)    All other components within normal limits  CBC WITH DIFFERENTIAL/PLATELET  TSH    EKG  EKG Interpretation None       Radiology Ct Head Wo Contrast  Result Date: 06/29/2016 CLINICAL DATA:  Headache for 1 week. EXAM: CT HEAD WITHOUT CONTRAST TECHNIQUE: Contiguous axial images were obtained from the base of the skull through the vertex without intravenous contrast. COMPARISON:  11/14/2015 FINDINGS: Brain: No acute intracranial abnormality. Specifically, no hemorrhage, hydrocephalus, mass lesion, acute infarction, or significant intracranial injury. Vascular: No hyperdense vessel or unexpected calcification. Skull: No acute calvarial abnormality. Sinuses/Orbits: Visualized paranasal sinuses and mastoids clear. Orbital soft tissues unremarkable. Other: None IMPRESSION: Normal study. Electronically Signed   By: Rolm Baptise M.D.   On: 06/29/2016  11:33    Procedures Procedures (including critical care time)  Medications Ordered in ED Medications  acetaminophen (TYLENOL) tablet 650 mg (650 mg Oral Given 06/29/16 1032)     Initial Impression / Assessment and Plan / ED Course  I have reviewed the triage vital signs and the nursing notes.  Pertinent labs & imaging results that were available during my care of the patient were reviewed by me and considered in my medical decision making (see chart for details).     Imaging and labs reviewed.  Pt with no neurologic findings on exam. No concerning source of headache sx found and normal exam.  Plan f/u with pcp within one week if sx persist or worsen.    The patient appears reasonably screened and/or stabilized for discharge and I doubt any other medical condition or other [facility] requiring further screening, evaluation, or treatment in the ED at this time prior to discharge.   Final Clinical Impressions(s) / ED Diagnoses   Final diagnoses:  Acute non intractable tension-type headache    New Prescriptions New Prescriptions   No medications on file     Evalee Jefferson, Hershal Coria 06/29/16 1145    Carmin Muskrat, MD 06/29/16 1438

## 2016-06-29 NOTE — ED Notes (Signed)
Pt returned from CT °

## 2016-06-29 NOTE — Discharge Instructions (Signed)
Your lab tests and your CT scan are normal today, including your sodium level.  Your CT scan of your brain is also normal.  It is safe to take tylenol if needed for continued headaches if needed.  Follow up with your doctor within 1 week for a recheck of your symptoms.

## 2016-06-29 NOTE — ED Triage Notes (Signed)
Pt states he has had a headache x 1 week, feels "fuzzy headed" since last night, a/o, no acute distress at this time

## 2016-07-07 ENCOUNTER — Telehealth: Payer: Self-pay | Admitting: Cardiology

## 2016-07-07 ENCOUNTER — Encounter (HOSPITAL_COMMUNITY): Payer: Self-pay | Admitting: *Deleted

## 2016-07-07 ENCOUNTER — Emergency Department (HOSPITAL_COMMUNITY)
Admission: EM | Admit: 2016-07-07 | Discharge: 2016-07-07 | Disposition: A | Discharge status: Home / Self Care | Payer: Medicare Other | Attending: Emergency Medicine | Admitting: Emergency Medicine

## 2016-07-07 ENCOUNTER — Emergency Department (HOSPITAL_COMMUNITY): Payer: Medicare Other

## 2016-07-07 DIAGNOSIS — I10 Essential (primary) hypertension: Secondary | ICD-10-CM | POA: Diagnosis not present

## 2016-07-07 DIAGNOSIS — I493 Ventricular premature depolarization: Secondary | ICD-10-CM

## 2016-07-07 DIAGNOSIS — Z7901 Long term (current) use of anticoagulants: Secondary | ICD-10-CM | POA: Diagnosis not present

## 2016-07-07 DIAGNOSIS — Z8547 Personal history of malignant neoplasm of testis: Secondary | ICD-10-CM | POA: Insufficient documentation

## 2016-07-07 DIAGNOSIS — R002 Palpitations: Secondary | ICD-10-CM | POA: Diagnosis present

## 2016-07-07 HISTORY — DX: Unspecified atrial fibrillation: I48.91

## 2016-07-07 LAB — URINALYSIS, ROUTINE W REFLEX MICROSCOPIC
BILIRUBIN URINE: NEGATIVE
GLUCOSE, UA: NEGATIVE mg/dL
HGB URINE DIPSTICK: NEGATIVE
KETONES UR: NEGATIVE mg/dL
Leukocytes, UA: NEGATIVE
Nitrite: NEGATIVE
PH: 8 (ref 5.0–8.0)
PROTEIN: NEGATIVE mg/dL
SPECIFIC GRAVITY, URINE: 1.004 — AB (ref 1.005–1.030)

## 2016-07-07 LAB — BASIC METABOLIC PANEL
ANION GAP: 9 (ref 5–15)
BUN: 8 mg/dL (ref 6–20)
CO2: 25 mmol/L (ref 22–32)
Calcium: 9.3 mg/dL (ref 8.9–10.3)
Chloride: 102 mmol/L (ref 101–111)
Creatinine, Ser: 0.87 mg/dL (ref 0.61–1.24)
GFR calc Af Amer: >60 mL/min (ref >60)
GFR calc non Af Amer: >60 mL/min (ref >60)
GLUCOSE: 111 mg/dL — AB (ref 65–99)
POTASSIUM: 3.7 mmol/L (ref 3.5–5.1)
Sodium: 136 mmol/L (ref 135–145)

## 2016-07-07 LAB — CBC
HEMATOCRIT: 41.2 % (ref 39.0–52.0)
HEMOGLOBIN: 13.8 g/dL (ref 13.0–17.0)
MCH: 29.2 pg (ref 26.0–34.0)
MCHC: 33.5 g/dL (ref 30.0–36.0)
MCV: 87.3 fL (ref 78.0–100.0)
Platelets: 276 10*3/uL (ref 150–400)
RBC: 4.72 MIL/uL (ref 4.22–5.81)
RDW: 13.7 % (ref 11.5–15.5)
WBC: 9.2 10*3/uL (ref 4.0–10.5)

## 2016-07-07 LAB — MAGNESIUM: Magnesium: 2.2 mg/dL (ref 1.7–2.4)

## 2016-07-07 MED ORDER — SODIUM CHLORIDE 0.9 % IV BOLUS (SEPSIS): 500.0000 mL | Freq: Once | INTRAVENOUS | Status: DC

## 2016-07-07 MED ORDER — SODIUM CHLORIDE 0.9 % IV BOLUS (SEPSIS): 1000.0000 mL | Freq: Once | INTRAVENOUS | Status: DC

## 2016-07-07 NOTE — Telephone Encounter (Signed)
Pt called feeling anxious about his HR. Questioning whether he should take Dilitizem 30mg  tablet. He felt like his HR was in rhythm, but "skipping beats". States rate is currently in the mid 60s. No symptoms  Instructed not to take the Dilt 30mg  tablet unless HR is >100 as per Rx instructions. Confirmed he was taking his Eliquis as Rx. Instructed to monitor HR, and Ss reviewed of when to come to the ER. He thanked me for the call and was agreeable to this plan.   Reino Bellis NP-c

## 2016-07-07 NOTE — ED Triage Notes (Signed)
Pt reports hx of afib, having palpitations since last night. Denies SOB. ekg done at triage.

## 2016-07-07 NOTE — ED Notes (Signed)
Pt stable, understands discharge instructions, and reasons for return.   

## 2016-07-07 NOTE — Discharge Instructions (Signed)
-   Drink plenty of fluid - Take your diltiazem as prescribed - Call Dr. [name] on Monday, as you may benefit from starting/changing your medications to help prevent your PVCs - Return to the ER with any new or concerning symptoms - Get plenty of sleep - Avoid caffeine

## 2016-07-07 NOTE — ED Notes (Signed)
Pt able to ambulate in hall with no complaints.

## 2016-07-07 NOTE — ED Provider Notes (Signed)
[facility] DEPT Provider Note   CSN: [id number] Arrival date & time: 07/07/16  1636     History   Chief Complaint Chief Complaint  Patient presents with  . Palpitations    HPI [name] is a 71 y.o. male.  HPI   71 yo M with PMHx of AFib, HTN, HLD here with symptomatic palpitations. Pt states that over the past 48 hours, he has had recurrent episodes in which he feels an 'extra beat' in his heart followed by brief, <1 second pause. He has no associated CP, SOB, palpitations, or lightheadedness. He has a h/o afib and states he knows when he is in afib, and that he has not been in AFib during these episodes. He has been adherent with his medications. He denies any other complaints. No heavy caffeine use. No stimulant use. He has not had difficulty sleeping.  Past Medical History:  Diagnosis Date  . Anxiety   . Atrial fibrillation ([place])   . 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 Chest pain    myoview 07/09/12-normal, nl ef; echo 04/12/05-ef>55%, mild aortic sclerosis  . GERD (gastroesophageal reflux disease)   . H. pylori infection   . Headache   . Hypercholesteremia   . Hyperlipidemia   . Hypertension   . Hyponatremia 2013  . Palpitations    event monior 04/11/05- SR with occ PAC  . Personal history of colonic polyps 2 9 2007    Patient Active Problem List   Diagnosis Date Noted  . Atrial fibrillation ([place]) 03/13/2016  . Hx of colonic polyps 12/15/2015  . Palpitations 02/25/2015  . Lumbago 01/01/2013  . Chest pain 08/08/2012  . HTN (hypertension) 08/08/2012  . Diarrhea 10/23/2010  . Epigastric pain 10/23/2010  . Anxiety   . Hyperlipidemia   . GERD (gastroesophageal reflux disease)   . COLONIC POLYPS 11/23/2008  . GERD 11/23/2008  . DYSPEPSIA, CHRONIC 11/23/2008  . IBS 11/23/2008  . LOSS OF APPETITE 11/23/2008  . EPIGASTRIC PAIN 11/23/2008  . HEARTBURN, HX OF 11/23/2008    Past Surgical History:  Procedure Laterality Date  . BACK SURGERY    . CATARACT  EXTRACTION W/PHACO Left 12/08/2012   Procedure: CATARACT EXTRACTION PHACO AND INTRAOCULAR LENS PLACEMENT (IOC);  Surgeon: Tonny Branch, MD;  Location: [facility];  Service: Ophthalmology;  Laterality: Left;  CDE:  23.19  . CATARACT EXTRACTION W/PHACO Right 04/15/2014   Procedure: CATARACT EXTRACTION PHACO AND INTRAOCULAR LENS PLACEMENT RIGHT;  Surgeon: Tonny Branch, MD;  Location: [facility];  Service: Ophthalmology;  Laterality: Right;  CDE:10.32  . COLONOSCOPY  12/06/2010   Procedure: COLONOSCOPY;  Surgeon: Rogene Houston, MD;  [facility];  Service: Endoscopy;  Laterality: N/A;  . COLONOSCOPY N/A 05/23/2016   Procedure: COLONOSCOPY;  Surgeon: Rogene Houston, MD;  [facility];  Service: Endoscopy;  Laterality: N/A;  930  . ESOPHAGOGASTRODUODENOSCOPY N/A 07/20/2015   Procedure: ESOPHAGOGASTRODUODENOSCOPY (EGD);  Surgeon: Rogene Houston, MD;  [facility];  Service: Endoscopy;  Laterality: N/A;  2:55 - moved to 1:55 - Ann notified pt  . EYE SURGERY    . LUMBAR DISC SURGERY Left 2007   "L4-5 ruptured discs"  . POLYPECTOMY  05/23/2016   Procedure: POLYPECTOMY;  Surgeon: Rogene Houston, MD;  [facility];  Service: Endoscopy;;  colon       Home Medications    Prior to Admission medications   Medication Sig Start Date End Date Taking? Authorizing Provider  acetaminophen (TYLENOL) 500 MG tablet Take 250 mg by mouth every 6 (six) hours as needed for headache (pain).   Yes Historical Provider, MD  apixaban (ELIQUIS) 5 MG TABS tablet Take 1 tablet (5 mg total) by mouth 2 (two) times daily. 05/25/16  Yes Rogene Houston, MD  atorvastatin (LIPITOR) 10 MG tablet Take 10 mg by mouth at bedtime.    Yes Historical Provider, MD  diltiazem (CARDIZEM CD) 180 MG 24 hr capsule Take 1 capsule (180 mg total) by mouth daily. 03/16/16  Yes Arbutus Leas, NP  diltiazem (CARDIZEM) 30 MG tablet Take 1 tablet every 4 hours AS NEEDED for afib heart rate >100 Patient taking differently:  Take 30 mg by mouth See admin instructions. Take 1 tablet (30 mg) by mouth every 4 hours as needed for AFIB heart rate >100 04/19/16  Yes Sherran Needs, NP  HYDROcodone-acetaminophen (NORCO/VICODIN) 5-325 MG tablet Take 1 tablet by mouth every 4 (four) hours as needed. Patient not taking: Reported on 07/07/2016 04/15/16   Larene Pickett, PA-C    Family History Family History  Problem Relation Age of Onset  . Hypertension Father   . Hyperlipidemia Father     Social History Social History  Substance Use Topics  . Smoking status: Never Smoker  . Smokeless tobacco: Never Used  . Alcohol use No     Allergies   Lisinopril; Chlorthalidone; Hyoscyamine; Prevacid [lansoprazole]; and Valium [diazepam]   Review of Systems Review of Systems  Constitutional: Negative for chills, fatigue and fever.  HENT: Negative for congestion and rhinorrhea.   Eyes: Negative for visual disturbance.  Respiratory: Negative for cough, shortness of breath and wheezing.   Cardiovascular: Positive for palpitations. Negative for chest pain and leg swelling.  Gastrointestinal: Negative for abdominal pain, diarrhea, nausea and vomiting.  Genitourinary: Negative for dysuria and flank pain.  Musculoskeletal: Negative for neck pain and neck stiffness.  Skin: Negative for rash and wound.  Allergic/Immunologic: Negative for immunocompromised state.  Neurological: Negative for syncope, weakness and headaches.  All other systems reviewed and are negative.    Physical Exam Updated Vital Signs BP (!) 151/74   Pulse 60   Temp 98.7 F (37.1 C) (Oral)   Resp 18   Ht 5\' 8"  (1.727 m)   Wt 150 lb (68 kg)   SpO2 98%   BMI 22.81 kg/m   Physical Exam  Constitutional: He is oriented to person, place, and time. He appears well-developed and well-nourished. No distress.  HENT:  Head: Normocephalic and atraumatic.  Eyes: Conjunctivae are normal.  Neck: Neck supple.  Cardiovascular: Normal rate, regular rhythm and  normal heart sounds.  Exam reveals no friction rub.   No murmur heard. Occasional extrasystoles  Pulmonary/Chest: Effort normal and breath sounds normal. No respiratory distress. He has no wheezes. He has no rales.  Abdominal: He exhibits no distension.  Musculoskeletal: He exhibits no edema.  Neurological: He is alert and oriented to person, place, and time. He exhibits normal muscle tone.  Skin: Skin is warm. Capillary refill takes less than 2 seconds.  Psychiatric: He has a normal mood and affect.  Nursing note and vitals reviewed.    ED Treatments / Results  Labs (all labs ordered are listed, but only abnormal results are displayed) Labs Reviewed  BASIC METABOLIC PANEL - Abnormal; Notable for the following:       Result Value   Glucose, Bld 111 (*)    All other components within normal limits  URINALYSIS, ROUTINE  W REFLEX MICROSCOPIC - Abnormal; Notable for the following:    Color, Urine COLORLESS (*)    Specific Gravity, Urine 1.004 (*)    All other components within normal limits  CBC  MAGNESIUM    EKG  EKG Interpretation  Date/Time:  Saturday July 07 2016 16:40:46 EDT Ventricular Rate:  72 PR Interval:  164 QRS Duration: 90 QT Interval:  394 QTC Calculation: 431 R Axis:   39 Text Interpretation:  Normal sinus rhythm Nonspecific ST and T wave abnormality Abnormal ECG No significant change since last tracing Confirmed by Tilak Oakley MD, Xitlally Mooneyham [phone]) on 07/07/2016 7:05:49 PM       Radiology Dg Chest 2 View  Result Date: 07/07/2016 CLINICAL DATA:  Atrial fibrillation. EXAM: CHEST  2 VIEW COMPARISON:  03/13/2016 FINDINGS: The lungs are clear wiithout focal pneumonia, edema, pneumothorax or pleural effusion. The cardiopericardial silhouette is within normal limits for size. The visualized bony structures of the thorax are intact. Nodular density/densities projecting over the lungs are compatible with pads for telemetry leads. IMPRESSION: No active cardiopulmonary  disease. Electronically Signed   By: Misty Stanley M.D.   On: 07/07/2016 17:16    Procedures Procedures (including critical care time)  Medications Ordered in ED Medications - No data to display   Initial Impression / Assessment and Plan / ED Course  I have reviewed the triage vital signs and the nursing notes.  Pertinent labs & imaging results that were available during my care of the patient were reviewed by me and considered in my medical decision making (see chart for details).     71 yo M with PMHx as above here with mild symptomatic palpitations. On tele, pt's sx correlate directly with PVCs, which are occasional with no episodes of sustained/recurrent PVCs. His lab work is very reassuring. EKG non-ischemic and he is in NSR, with no signs of arrhythmia other than PVCs. Lytes acceptable/wnl. CXR is clear. I suspect his sx are secondary to symptomatic PVCs as well as potentially some anxiety. Discussed with Cards fellow on call - given that he is already on CCB and PVCs are infrequent with no signs of CP, lightheadedness, or other severe sx, will hold on changing meds at this time, encourage hydration, and have him f/u with Cardiology on Monday. Return precautions given. Encouraged med adherence.  Final Clinical Impressions(s) / ED Diagnoses   Final diagnoses:  Symptomatic PVCs    New Prescriptions Discharge Medication List as of 07/07/2016  9:45 PM       Duffy Bruce, MD 07/08/16 [phone]

## 2016-07-11 ENCOUNTER — Encounter: Payer: Self-pay | Admitting: Cardiovascular Disease

## 2016-07-11 ENCOUNTER — Ambulatory Visit (INDEPENDENT_AMBULATORY_CARE_PROVIDER_SITE_OTHER): Payer: Medicare Other | Admitting: Cardiovascular Disease

## 2016-07-11 DIAGNOSIS — R002 Palpitations: Secondary | ICD-10-CM | POA: Diagnosis not present

## 2016-07-11 MED ORDER — METOPROLOL TARTRATE 25 MG PO TABS: 12.5000 mg | ORAL_TABLET | Freq: Two times a day (BID) | ORAL | 1 refills | Status: DC

## 2016-07-11 NOTE — Progress Notes (Signed)
07/11/2016 IMRI LOR   1945-04-20  [id number]  Primary Physician Glenda Chroman, MD Primary Cardiologist: Lorretta Harp MD Renae Gloss  HPI:  [name] is a 71 year old well-appearing Caucasian male who I saw 05/04/16. He has a history of hypertension and hyperlipidemia. He had atypical chest pain here tone with a negative Myoview 07/09/12. His pain improved with proton pump inhibition. Because of recurrent chest pain he saw [name], PA-C in the office who did obtain a 2-D echo and Myoview perfusion study all normal. His pain has improved again on a PPI suggesting that it was related to GERD. Since I saw Mr. [name] in the office a year ago he has been well until this past Christmas when he was admitted to [facility] with A. fib with RVR. He converted to normal sinus rhythm with IV diltiazem and was converted to by mouth Cardizem. His troponins were negative and 2-D echo was normal. Because of his elevated CHA2DsVASC2 score of 2 he was begun on Eliquis oral anticoagulation. He saw Roderic Palau in the A. fib clinic 04/19/16 because of palpitations. He says he occasionally feels his heart skip or missed AB. She also had a more prolonged episode of PAF lasting 20 seconds. She gave him when necessary diltiazem 30 mg a day for persistently elevated heart rate. He was just seen in the emergency room 07/07/16 by Dr. Ellender Hose. He was complaining of palpitations and was noted to have PVCs.   Current Outpatient Prescriptions  Medication Sig Dispense Refill  . acetaminophen (TYLENOL) 500 MG tablet Take 250 mg by mouth every 6 (six) hours as needed for headache (pain).    Marland Kitchen apixaban (ELIQUIS) 5 MG TABS tablet Take 1 tablet (5 mg total) by mouth 2 (two) times daily. 60 tablet   . atorvastatin (LIPITOR) 10 MG tablet Take 10 mg by mouth at bedtime.     Marland Kitchen diltiazem (CARDIZEM CD) 180 MG 24 hr capsule Take 1 capsule (180 mg total) by mouth daily. 30 capsule 12  . diltiazem (CARDIZEM)  30 MG tablet Take 1 tablet every 4 hours AS NEEDED for afib heart rate >100 (Patient taking differently: Take 30 mg by mouth See admin instructions. Take 1 tablet (30 mg) by mouth every 4 hours as needed for AFIB heart rate >100) 45 tablet 1  . HYDROcodone-acetaminophen (NORCO/VICODIN) 5-325 MG tablet Take 1 tablet by mouth every 4 (four) hours as needed. 10 tablet 0  . metoprolol tartrate (LOPRESSOR) 25 MG tablet Take 0.5 tablets (12.5 mg total) by mouth 2 (two) times daily. 90 tablet 1   No current facility-administered medications for this visit.     Allergies  Allergen Reactions  . Lisinopril Swelling    H/o of angioedema   . Chlorthalidone Other (See Comments)    Causes severe hyponatremia  . Hyoscyamine Swelling  . Prevacid [Lansoprazole] Swelling  . Valium [Diazepam] Other (See Comments)    "makes me feel crazy"    Social History   Social History  . Marital status: Married    Spouse name: N/A  . Number of children: N/A  . Years of education: N/A   Occupational History  . Not on file.   Social History Main Topics  . Smoking status: Never Smoker  . Smokeless tobacco: Never Used  . Alcohol use No  . Drug use: No  . Sexual activity: Not on file   Other Topics Concern  . Not on file   Social  History Narrative   ** Merged History Encounter **         Review of Systems: General: negative for chills, fever, night sweats or weight changes.  Cardiovascular: negative for chest pain, dyspnea on exertion, edema, orthopnea, palpitations, paroxysmal nocturnal dyspnea or shortness of breath Dermatological: negative for rash Respiratory: negative for cough or wheezing Urologic: negative for hematuria Abdominal: negative for nausea, vomiting, diarrhea, bright red blood per rectum, melena, or hematemesis Neurologic: negative for visual changes, syncope, or dizziness All other systems reviewed and are otherwise negative except as noted above.    Blood pressure (!) 162/80,  pulse 68, height 5\' 9"  (1.753 m), weight 142 lb (64.4 kg).  General appearance: alert and no distress Neck: no adenopathy, no carotid bruit, no JVD, supple, symmetrical, trachea midline and thyroid not enlarged, symmetric, no tenderness/mass/nodules Lungs: clear to auscultation bilaterally Heart: regular rate and rhythm, S1, S2 normal, no murmur, click, rub or gallop Extremities: extremities normal, atraumatic, no cyanosis or edema  EKG not performed today  ASSESSMENT AND PLAN:   HTN (hypertension) History of essential hypertension blood pressure measured 160/80. He doesn't measure his blood pressure, and is somewhat lower than this. He says he has "white coat hypertension. Because of his recent PVCs demonstrated in the emergency room I am going add low-dose beta blockade. He will see Erasmo Downer back in one month for follow-up of blood pressure and pulse.  Palpitations History of palpitations with demonstrated PAF in the past currently on oral anticoagulation and Cardizem. He was recently seen in the emergency room for palpitations and was found to have PVCs. He has cut out caffeine. He is a very "nervous" person with a lot of anxiety. Ambien at Lopressor 12.5 mg by mouth twice a day 3 times a day and have him see Erasmo Downer back in one month for dilation of heart rate and blood pressure.      Lorretta Harp MD FACP,FACC,FAHA, [facility] 07/11/2016 11:49 AM

## 2016-07-11 NOTE — Assessment & Plan Note (Signed)
History of palpitations with demonstrated PAF in the past currently on oral anticoagulation and Cardizem. He was recently seen in the emergency room for palpitations and was found to have PVCs. He has cut out caffeine. He is a very "nervous" person with a lot of anxiety. Ambien at Lopressor 12.5 mg by mouth twice a day 3 times a day and have him see Erasmo Downer back in one month for dilation of heart rate and blood pressure.

## 2016-07-11 NOTE — Patient Instructions (Signed)
Medication Instructions: START Metoprolol 25 mg---take 1/2 tab (12.5 mg) twice daily for PVCs   Follow-Up: Your physician recommends that you schedule a follow-up appointment in: 1 month with HTN Clinic for evaluation of BP and HR  We request that you follow-up in: 3 months with an extender and in 6 months with Dr Andria Rhein will receive a reminder letter in the mail two months in advance. If you don't receive a letter, please call our office to schedule the follow-up appointment.  If you need a refill on your cardiac medications before your next appointment, please call your pharmacy.

## 2016-07-11 NOTE — Assessment & Plan Note (Signed)
History of essential hypertension blood pressure measured 160/80. He doesn't measure his blood pressure, and is somewhat lower than this. He says he has "white coat hypertension. Because of his recent PVCs demonstrated in the emergency room I am going add low-dose beta blockade. He will see [name] back in one month for follow-up of blood pressure and pulse.

## 2016-08-08 ENCOUNTER — Encounter: Payer: Self-pay | Admitting: Pharmacist Clinician (PhC)/ Clinical Pharmacy Specialist

## 2016-08-08 ENCOUNTER — Ambulatory Visit (INDEPENDENT_AMBULATORY_CARE_PROVIDER_SITE_OTHER): Payer: Medicare Other | Admitting: Pharmacist Clinician (PhC)/ Clinical Pharmacy Specialist

## 2016-08-08 DIAGNOSIS — I10 Essential (primary) hypertension: Secondary | ICD-10-CM

## 2016-08-08 NOTE — Progress Notes (Signed)
08/08/2016 [name] 21-Jan-1946 [id number]   HPI:  [name] is a 71 y.o. male patient of [name], with a PMH below who presents today for hypertension clinic evaluation.  I have worked with him several times in past years, although some of his hypertension is due to white-coat syndrome.  In addition to hypertension, he suffers from hyperlipidemia, atrial fibrillation, and palpitations.    Blood Pressure Goal:  130/80   Current Medications:  Diltiazem 180 mg qd  Metoprolol tart 12.5 mg bid  Diltiazem 30 mg prn palpitations  Family Hx:  Father died at 51 from CHF, also had CABG; no other significant family history  Social Hx:  Quit smoking 35+ years ago, no caffeine or alcohol; he also suffers some from claustrophobia (has difficulty with needing to use elevators in our building)  Diet:  Healthy diet, has always watched his sodium closely; eats mostly fish and [place] and plenty of vegetables  Exercise:  Walks 30-45 minutes 3-4 times per week  Home BP readings:  Has checked daily, last 27 readings average 133/69 with range of 123-138/63-74  Intolerances:   Lisinopril - angioedema  Chlorthalidone - hyponatremia  Wt Readings from Last 3 Encounters:  07/11/16 142 lb (64.4 kg)  07/07/16 150 lb (68 kg)  06/29/16 152 lb (68.9 kg)   BP Readings from Last 3 Encounters:  08/08/16 (!) 162/84  07/11/16 (!) 162/80  07/07/16 (!) 151/74   Pulse Readings from Last 3 Encounters:  08/08/16 60  07/11/16 68  07/07/16 60    Current Outpatient Prescriptions  Medication Sig Dispense Refill  . acetaminophen (TYLENOL) 500 MG tablet Take 250 mg by mouth every 6 (six) hours as needed for headache (pain).    Marland Kitchen apixaban (ELIQUIS) 5 MG TABS tablet Take 1 tablet (5 mg total) by mouth 2 (two) times daily. 60 tablet   . atorvastatin (LIPITOR) 10 MG tablet Take 10 mg by mouth at bedtime.     Marland Kitchen diltiazem (CARDIZEM CD) 180 MG 24 hr capsule Take 1 capsule (180 mg total) by mouth daily.  30 capsule 12  . diltiazem (CARDIZEM) 30 MG tablet Take 1 tablet every 4 hours AS NEEDED for afib heart rate >100 (Patient taking differently: Take 30 mg by mouth See admin instructions. Take 1 tablet (30 mg) by mouth every 4 hours as needed for AFIB heart rate >100) 45 tablet 1  . metoprolol tartrate (LOPRESSOR) 25 MG tablet Take 0.5 tablets (12.5 mg total) by mouth 2 (two) times daily. 90 tablet 1   No current facility-administered medications for this visit.     Allergies  Allergen Reactions  . Lisinopril Swelling    H/o of angioedema   . Chlorthalidone Other (See Comments)    Causes severe hyponatremia  . Hyoscyamine Swelling  . Prevacid [Lansoprazole] Swelling  . Valium [Diazepam] Other (See Comments)    "makes me feel crazy"    Past Medical History:  Diagnosis Date  . Anxiety   . Atrial fibrillation ([place])   . 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 Chest pain    myoview 07/09/12-normal, nl ef; echo 04/12/05-ef>55%, mild aortic sclerosis  . GERD (gastroesophageal reflux disease)   . H. pylori infection   . Headache   . Hypercholesteremia   . Hyperlipidemia   . Hypertension   . Hyponatremia 2013  . Palpitations    event monior 04/11/05- SR with occ PAC  . Personal history of colonic polyps 2 9 2007  Blood pressure (!) 162/84, pulse 60.  HTN (hypertension) While his office pressure is usually elevated, his home readings are quite consistent in the 062'I and 948'N systolic.  Much of what we see in the office is related to his white coat syndrome.  I am not going to make any changes to his medications at this time, but have asked that he continue with regular home monitoring, although it does not need to be daily.  He has a follow up appointment with [name] in July, I have asked that he call should he have any concerns before then.   [name] [name] [facility]

## 2016-08-08 NOTE — Patient Instructions (Addendum)
Return for a a follow up appointment with Rosaria Ferries PA in July  Your blood pressure today is 162/84 (goal is < 130/80)  Check your blood pressure at home daily and keep record of the readings.  Take your BP meds as follows:  Continue with all your current medications   Bring all of your meds, your BP cuff and your record of home blood pressures to your next appointment.  Exercise as you're able, try to walk approximately 30 minutes per day.  Keep salt intake to a minimum, especially watch canned and prepared boxed foods.  Eat more fresh fruits and vegetables and fewer canned items.  Avoid eating in fast food restaurants.    HOW TO TAKE YOUR BLOOD PRESSURE: . Rest 5 minutes before taking your blood pressure. .  Don't smoke or drink caffeinated beverages for at least 30 minutes before. . Take your blood pressure before (not after) you eat. . Sit comfortably with your back supported and both feet on the floor (don't cross your legs). . Elevate your arm to heart level on a table or a desk. . Use the proper sized cuff. It should fit smoothly and snugly around your bare upper arm. There should be enough room to slip a fingertip under the cuff. The bottom edge of the cuff should be 1 inch above the crease of the elbow. . Ideally, take 3 measurements at one sitting and record the average.

## 2016-08-08 NOTE — Assessment & Plan Note (Signed)
While his office pressure is usually elevated, his home readings are quite consistent in the 829'F and 621'H systolic.  Much of what we see in the office is related to his white coat syndrome.  I am not going to make any changes to his medications at this time, but have asked that he continue with regular home monitoring, although it does not need to be daily.  He has a follow up appointment with Rosaria Ferries in July, I have asked that he call should he have any concerns before then.

## 2016-10-10 ENCOUNTER — Encounter: Payer: Self-pay | Admitting: Physician Assistant

## 2016-10-10 ENCOUNTER — Ambulatory Visit (INDEPENDENT_AMBULATORY_CARE_PROVIDER_SITE_OTHER): Payer: Medicare Other | Admitting: Physician Assistant

## 2016-10-10 VITALS — BP 166/82 | HR 82 | Ht 68.0 in | Wt 153.6 lb

## 2016-10-10 DIAGNOSIS — Z7901 Long term (current) use of anticoagulants: Secondary | ICD-10-CM

## 2016-10-10 DIAGNOSIS — I10 Essential (primary) hypertension: Secondary | ICD-10-CM

## 2016-10-10 DIAGNOSIS — I480 Paroxysmal atrial fibrillation: Secondary | ICD-10-CM | POA: Diagnosis not present

## 2016-10-10 MED ORDER — METOPROLOL TARTRATE 25 MG PO TABS: 12.5000 mg | ORAL_TABLET | Freq: Two times a day (BID) | ORAL | 3 refills | Status: DC

## 2016-10-10 NOTE — Patient Instructions (Addendum)
Medication Instructions:  Your physician recommends that you continue on your current medications as directed. Please refer to the Current Medication list given to you today. If you need a refill on your cardiac medications before your next appointment, please call your pharmacy.  Follow-Up: Your physician wants you to follow-up in: [place]. You should receive a reminder letter in the mail two months in advance. If you do not receive a letter, please call our office November to schedule the January 2019 follow-up appointment.   Special Instructions: IF YOUR HEART RATE >170 CALL 911 OK TO TAKE THE BABY CARDIZEM(30MG ) IF EXPERIENCING AFIB   Thank you for choosing [facility] at [facility]!!

## 2016-10-10 NOTE — Progress Notes (Signed)
Cardiology Office Note   Date:  10/10/2016   ID:  [name], [name] [birth year], MRN [id number]  PCP:  Glenda Chroman, MD  Cardiologist:  Dr. [name], 07/11/2016  Rosaria Ferries, PA-C   Chief Complaint  Patient presents with  . Follow-up    History of Present Illness: [name] is a 71 y.o. male with a history of HTN, HLD, PAF, GERD, PACs, hyponatremia, probable white coat syndrome  Office visit 05/23 w/ [name] for BP management, home BP readings [id number] systolic, no med changes made  [name] presents for cardiology follow up.  He is to have a CT scan on his neck soon, was told it would include dye. He wonders if the dye will interact with the meds.   His BP machine was calibrated today and was acceptably close. His BPs on his home machine are 120s-130s most of the time.   He gets palpitations at times, they only last a few seconds and then resolve spontaneously. This only happens 2-3 x month. They have never lasted long enough to take the baby Cardizem.  His HR is low at times on his BP checks, in the 50s, he wonders if that is ok. He does not have orthostatic sx, denies fatigue.  No bleeding issues on the Eliquis.    Past Medical History:  Diagnosis Date  . Anxiety   . Atrial fibrillation ([place])   . 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 Chest pain    myoview 07/09/12-normal, nl ef; echo 04/12/05-ef>55%, mild aortic sclerosis  . GERD (gastroesophageal reflux disease)   . H. pylori infection   . Headache   . Hypercholesteremia   . Hyperlipidemia   . Hypertension   . Hyponatremia 2013  . Palpitations    event monior 04/11/05- SR with occ PAC  . Personal history of colonic polyps 2 9 2007    Past Surgical History:  Procedure Laterality Date  . BACK SURGERY    . CATARACT EXTRACTION W/PHACO Left 12/08/2012   Procedure: CATARACT EXTRACTION PHACO AND INTRAOCULAR LENS PLACEMENT (IOC);  Surgeon: Tonny Branch, MD;  Location: [facility];  Service: Ophthalmology;   Laterality: Left;  CDE:  23.19  . CATARACT EXTRACTION W/PHACO Right 04/15/2014   Procedure: CATARACT EXTRACTION PHACO AND INTRAOCULAR LENS PLACEMENT RIGHT;  Surgeon: Tonny Branch, MD;  Location: [facility];  Service: Ophthalmology;  Laterality: Right;  CDE:10.32  . COLONOSCOPY  12/06/2010   Procedure: COLONOSCOPY;  Surgeon: Rogene Houston, MD;  Location: [facility];  Service: Endoscopy;  Laterality: N/A;  . COLONOSCOPY N/A 05/23/2016   Procedure: COLONOSCOPY;  Surgeon: Rogene Houston, MD;  Location: [facility];  Service: Endoscopy;  Laterality: N/A;  930  . ESOPHAGOGASTRODUODENOSCOPY N/A 07/20/2015   Procedure: ESOPHAGOGASTRODUODENOSCOPY (EGD);  Surgeon: Rogene Houston, MD;  Location: [facility];  Service: Endoscopy;  Laterality: N/A;  2:55 - moved to 1:55 - Ann notified pt  . EYE SURGERY    . LUMBAR DISC SURGERY Left 2007   "L4-5 ruptured discs"  . POLYPECTOMY  05/23/2016   Procedure: POLYPECTOMY;  Surgeon: Rogene Houston, MD;  Location: [facility];  Service: Endoscopy;;  colon    Current Outpatient Prescriptions  Medication Sig Dispense Refill  . acetaminophen (TYLENOL) 500 MG tablet Take 250 mg by mouth every 6 (six) hours as needed for headache (pain).    Marland Kitchen apixaban (ELIQUIS) 5 MG TABS tablet Take 1 tablet (5 mg total) by mouth  2 (two) times daily. 60 tablet   . atorvastatin (LIPITOR) 10 MG tablet Take 10 mg by mouth at bedtime.     Marland Kitchen diltiazem (CARDIZEM CD) 180 MG 24 hr capsule Take 1 capsule (180 mg total) by mouth daily. 30 capsule 12  . diltiazem (CARDIZEM) 30 MG tablet Take 1 tablet every 4 hours AS NEEDED for afib heart rate >100 (Patient taking differently: Take 30 mg by mouth See admin instructions. Take 1 tablet (30 mg) by mouth every 4 hours as needed for AFIB heart rate >100) 45 tablet 1  . pantoprazole (PROTONIX) 40 MG tablet Take 40 mg by mouth as needed.    . metoprolol tartrate (LOPRESSOR) 25 MG tablet Take 0.5 tablets (12.5 mg total) by mouth 2 (two) times daily. 90  tablet 1   No current facility-administered medications for this visit.     Allergies:   Lisinopril; Chlorthalidone; Hyoscyamine; Prevacid [lansoprazole]; and Valium [diazepam]    Social History:  The patient  reports that he has never smoked. He has never used smokeless tobacco. He reports that he does not drink alcohol or use drugs.   Family History:  The patient's family history includes Hyperlipidemia in his father; Hypertension in his father.    ROS:  Please see the history of present illness. All other systems are reviewed and negative.    PHYSICAL EXAM: VS:  BP (!) 166/82 (BP Location: Left Arm, Cuff Size: Normal)   Pulse 82   Ht 5\' 8"  (1.727 m)   Wt 153 lb 9.6 oz (69.7 kg)   BMI 23.35 kg/m  , BMI Body mass index is 23.35 kg/m. GEN: Well nourished, well developed, male in no acute distress  HEENT: normal for age  Neck: no JVD, no carotid bruit, no masses Cardiac: RRR; no murmur, no rubs, or gallops Respiratory:  clear to auscultation bilaterally, normal work of breathing GI: soft, nontender, nondistended, + BS MS: no deformity or atrophy; no edema; distal pulses are 2+ in all 4 extremities   Skin: warm and dry, no rash Neuro:  Strength and sensation are intact Psych: euthymic mood, full affect   EKG:  EKG is not ordered today.   Recent Labs: 06/29/2016: TSH 3.047 07/07/2016: BUN 8; Creatinine, Ser 0.87; Hemoglobin 13.8; Magnesium 2.2; Platelets 276; Potassium 3.7; Sodium 136    Lipid Panel    Component Value Date/Time   CHOL 172 12/20/2014 0947   CHOL 146 06/17/2014 0938   TRIG 99 12/20/2014 0947   HDL 46 12/20/2014 0947   HDL 47 06/17/2014 0938   CHOLHDL 3.7 12/20/2014 0947   VLDL 20 12/20/2014 0947   LDLCALC 106 12/20/2014 0947   LDLCALC 66 06/17/2014 0938     Wt Readings from Last 3 Encounters:  10/10/16 153 lb 9.6 oz (69.7 kg)  07/11/16 142 lb (64.4 kg)  07/07/16 150 lb (68 kg)     Other studies Reviewed: Additional studies/ records that were  reviewed today include: office notes and testing.  ASSESSMENT AND PLAN:  1.  PAF: he is having rare episodes, very brief. Reviewed reasons to take the baby Cardizem, he will use it prn. If HR > 170, call 911. HR 50s at times but no sx, no change in daily rx.  2. Chronic anticoag: CHADS2VASC=2 (age x 1, HTN). Continue Eliquis  3. HTN: BP records reviewed. Home BPs are acceptable, no change in meds.  4. HLD: followed by PCP,    Current medicines are reviewed at length with the patient today.  The patient does not have concerns regarding medicines.  The following changes have been made:  no change  Labs/ tests ordered today include:  No orders of the defined types were placed in this encounter.    Disposition:   FU with Dr. [name]  Signed, Lovelee Forner, Suanne Marker, PA-C  10/10/2016 10:14 AM    [place] Phone: [phone]; Fax: [phone]  This note was written with the assistance of speech recognition software. Please excuse any transcriptional errors.

## 2016-12-20 ENCOUNTER — Ambulatory Visit (INDEPENDENT_AMBULATORY_CARE_PROVIDER_SITE_OTHER): Payer: Medicare Other | Admitting: Internal Medicine

## 2016-12-26 ENCOUNTER — Encounter (INDEPENDENT_AMBULATORY_CARE_PROVIDER_SITE_OTHER): Payer: Self-pay

## 2016-12-26 ENCOUNTER — Encounter (INDEPENDENT_AMBULATORY_CARE_PROVIDER_SITE_OTHER): Payer: Self-pay | Admitting: Internal Medicine

## 2016-12-26 ENCOUNTER — Ambulatory Visit (INDEPENDENT_AMBULATORY_CARE_PROVIDER_SITE_OTHER): Payer: Medicare Other | Admitting: Internal Medicine

## 2016-12-26 VITALS — BP 146/64 | HR 64 | Temp 97.5°F | Ht 68.0 in | Wt 150.0 lb

## 2016-12-26 DIAGNOSIS — K219 Gastro-esophageal reflux disease without esophagitis: Secondary | ICD-10-CM

## 2016-12-26 MED ORDER — RANITIDINE HCL 150 MG PO CAPS: 150.0000 mg | ORAL_CAPSULE | Freq: Two times a day (BID) | ORAL | 5 refills | Status: DC

## 2016-12-26 MED ORDER — DEXLANSOPRAZOLE 60 MG PO CPDR: 60.0000 mg | DELAYED_RELEASE_CAPSULE | Freq: Every day | ORAL | 3 refills | Status: DC

## 2016-12-26 NOTE — Patient Instructions (Signed)
Samples of Dexilant given to patient. Take the Zantac at night.

## 2016-12-26 NOTE — Progress Notes (Signed)
Subjective:    Patient ID: [name], male    DOB: 03-Apr-1945, 71 y.o.   MRN: [id number]  HPI Present today with c/o reflux. Presently taking Zantac BID. He states the Zantac is helping. He still has some burning at times. The GERD comes and goes. Symptoms for a couple of months.  His appetite is good. No weight loss. BMs are normal. No melena or BRRB.  Has some acid reflux every day (small amt). Last EGD in 2017 (see below).     07/20/2015 EGD: epigastric pain.  Impression:               - Normal esophagus.                           - Esophagogastric landmarks identified.                           - Non-erosive gastritis. Biopsied.                           - Normal duodenal bulb and second portion of the                            duodenum.   Underwent a colonoscopy in March of this year for personal hx of colon polyps:  Impression:               - One 4 to 6 mm polyp at the hepatic flexure,                            removed with a cold snare. Resected and retrieved.                           - One 6 to 8 mm polyp in the proximal transverse                            colon, removed with a cold snare. Resected and                            retrieved. Clips (MR conditional) were placed.                           - One 5 to 8 mm polyp in the mid sigmoid colon,                            removed with a hot snare. Resected and retrieved.                            Clip (MR conditional) was placed. Tattooed.                           - Diverticulosis in the sigmoid colon.                           - External hemorrhoids.                           -  Single smali papilla.  Polyps are sessile serrated and tubular adenomas. Next colonoscopy in 5 years.   '  Review of Systems Past Medical History:  Diagnosis Date  . Anxiety   . Atrial fibrillation ([place])   . 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 Chest pain    myoview 07/09/12-normal, nl ef; echo 04/12/05-ef>55%, mild aortic  sclerosis  . GERD (gastroesophageal reflux disease)   . H. pylori infection   . Headache   . Hypercholesteremia   . Hyperlipidemia   . Hypertension   . Hyponatremia 2013  . Palpitations    event monior 04/11/05- SR with occ PAC  . Personal history of colonic polyps 2 9 2007    Past Surgical History:  Procedure Laterality Date  . BACK SURGERY    . CATARACT EXTRACTION W/PHACO Left 12/08/2012   Procedure: CATARACT EXTRACTION PHACO AND INTRAOCULAR LENS PLACEMENT (IOC);  Surgeon: Tonny Branch, MD;  Location: [facility];  Service: Ophthalmology;  Laterality: Left;  CDE:  23.19  . CATARACT EXTRACTION W/PHACO Right 04/15/2014   Procedure: CATARACT EXTRACTION PHACO AND INTRAOCULAR LENS PLACEMENT RIGHT;  Surgeon: Tonny Branch, MD;  Location: [facility];  Service: Ophthalmology;  Laterality: Right;  CDE:10.32  . COLONOSCOPY  12/06/2010   Procedure: COLONOSCOPY;  Surgeon: Rogene Houston, MD;  Location: [facility];  Service: Endoscopy;  Laterality: N/A;  . COLONOSCOPY N/A 05/23/2016   Procedure: COLONOSCOPY;  Surgeon: Rogene Houston, MD;  Location: [facility];  Service: Endoscopy;  Laterality: N/A;  930  . ESOPHAGOGASTRODUODENOSCOPY N/A 07/20/2015   Procedure: ESOPHAGOGASTRODUODENOSCOPY (EGD);  Surgeon: Rogene Houston, MD;  Location: [facility];  Service: Endoscopy;  Laterality: N/A;  2:55 - moved to 1:55 - Ann notified pt  . EYE SURGERY    . LUMBAR DISC SURGERY Left 2007   "L4-5 ruptured discs"  . POLYPECTOMY  05/23/2016   Procedure: POLYPECTOMY;  Surgeon: Rogene Houston, MD;  Location: [facility];  Service: Endoscopy;;  colon    Allergies  Allergen Reactions  . Lisinopril Swelling    H/o of angioedema   . Chlorthalidone Other (See Comments)    Causes severe hyponatremia  . Hyoscyamine Swelling  . Prevacid [Lansoprazole] Swelling  . Valium [Diazepam] Other (See Comments)    "makes me feel crazy"    Current Outpatient Prescriptions on File Prior to Visit  Medication Sig Dispense Refill    . acetaminophen (TYLENOL) 500 MG tablet Take 250 mg by mouth every 6 (six) hours as needed for headache (pain).    Marland Kitchen apixaban (ELIQUIS) 5 MG TABS tablet Take 1 tablet (5 mg total) by mouth 2 (two) times daily. 60 tablet   . atorvastatin (LIPITOR) 10 MG tablet Take 10 mg by mouth at bedtime.     Marland Kitchen diltiazem (CARDIZEM CD) 180 MG 24 hr capsule Take 1 capsule (180 mg total) by mouth daily. 30 capsule 12  . diltiazem (CARDIZEM) 30 MG tablet Take 1 tablet every 4 hours AS NEEDED for afib heart rate >100 (Patient taking differently: Take 30 mg by mouth See admin instructions. Take 1 tablet (30 mg) by mouth every 4 hours as needed for AFIB heart rate >100) 45 tablet 1  . metoprolol tartrate (LOPRESSOR) 25 MG tablet Take 0.5 tablets (12.5 mg total) by mouth 2 (two) times daily. 90 tablet 3   No current facility-administered medications on file prior to visit.         Objective:   Physical Exam Blood pressure Marland Kitchen)  146/64, pulse 64, temperature (!) 97.5 F (36.4 C), height 5\' 8"  (1.727 m), weight 150 lb (68 kg). Alert and oriented. Skin warm and dry. Oral mucosa is moist.   . Sclera anicteric, conjunctivae is pink. Thyroid not enlarged. No cervical lymphadenopathy. Lungs clear. Heart regular rate and rhythm.  Abdomen is soft. Bowel sounds are positive. No hepatomegaly. No abdominal masses felt. No tenderness.  No edema to lower extremities.           Assessment & Plan:  GERD. Am going to start him on Dexilant and see how he does. Will give him samples. Rx sent to his pharmacy.  Refill on his Zantac which he can take at night.

## 2017-01-12 IMAGING — CT CT ABD-PELV W/ CM
2 of 5 series · 15 of 46 positions shown, 17 images · IV contrast (Omnipaque 300)
Comparison: None.

CLINICAL DATA: Sharp constant umbilical pain for 1 week. Nausea,
vomiting and diarrhea beginning today. History of orchidectomy for
testicular cancer. Recent weight loss.

EXAM:
CT ABDOMEN AND PELVIS WITH CONTRAST
TECHNIQUE: Multidetector CT imaging of the abdomen and pelvis was performed
using the standard protocol following bolus administration of
intravenous contrast.
CONTRAST:  100mL OMNIPAQUE IOHEXOL 300 MG/ML  SOLN

[Series 2: abd_pel_with 5.0 b40f · axial · 0.63mm/px · z∈[+589,+1014]mm · 12 of 97 slices shown, 14 images]
[im 6/97  soft-tissue]
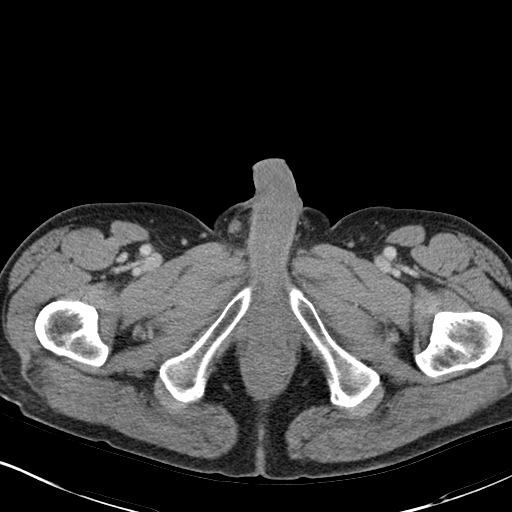
[im 6/97  bone]
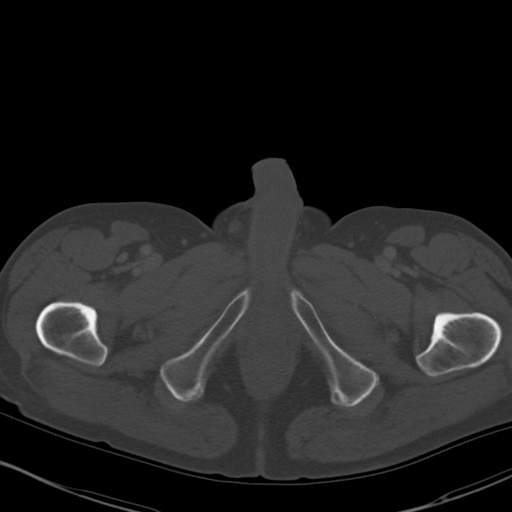
[im 16/97  soft-tissue]
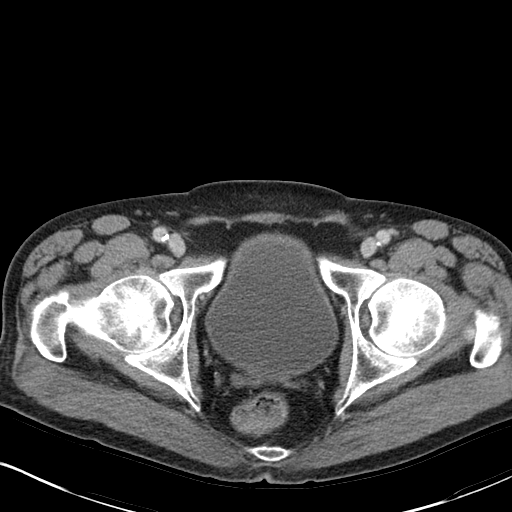
[im 21/97  soft-tissue]
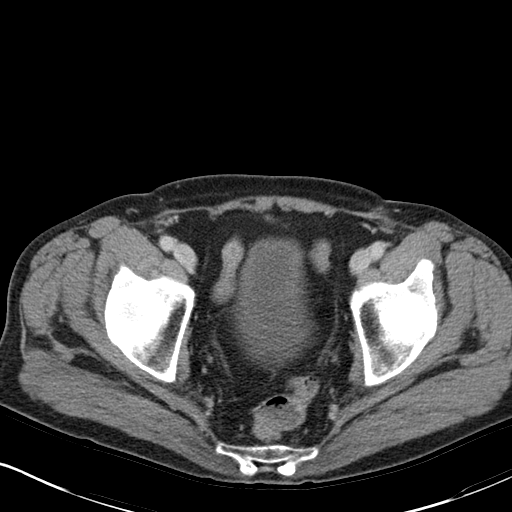
[im 31/97  soft-tissue]
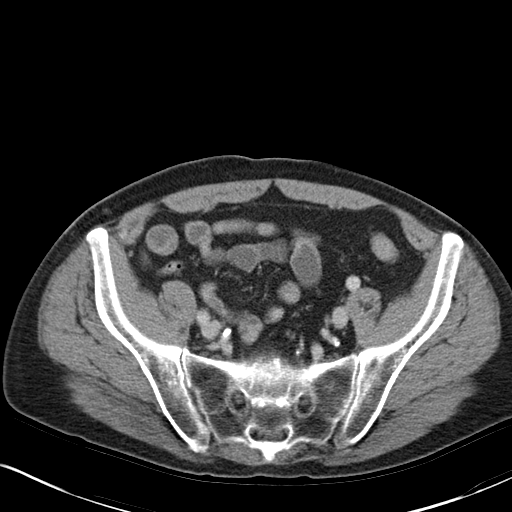
[im 36/97  soft-tissue]
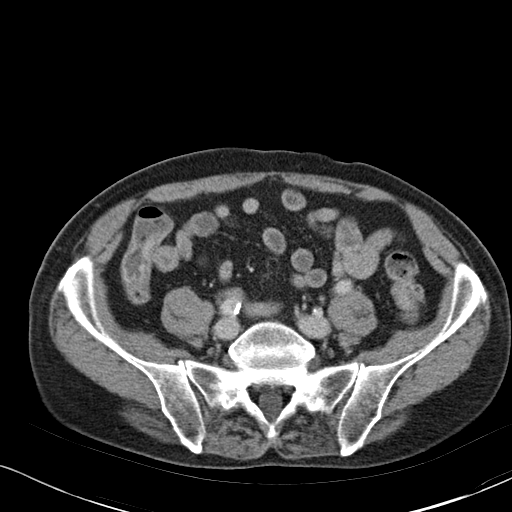
[im 46/97  soft-tissue]
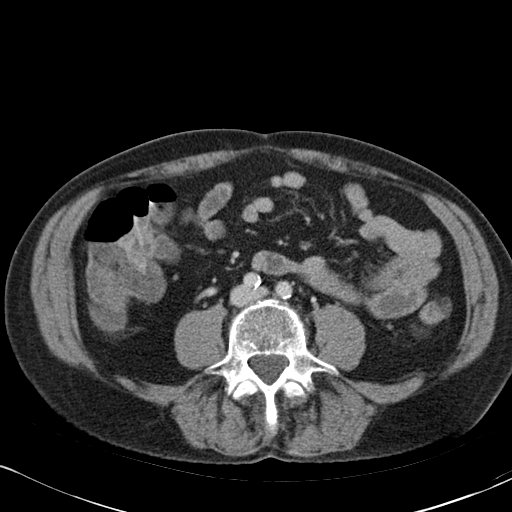
[im 51/97  soft-tissue]
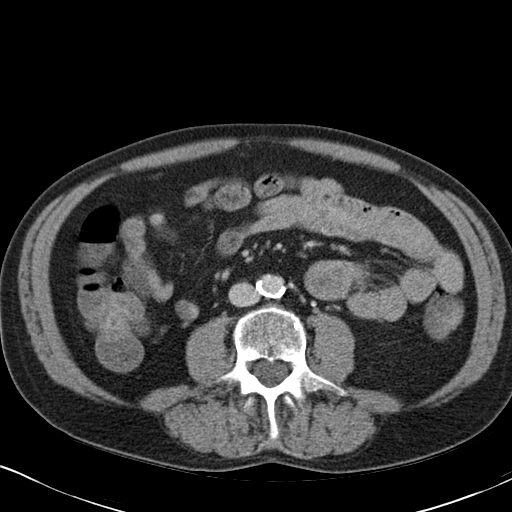
[im 61/97  soft-tissue]
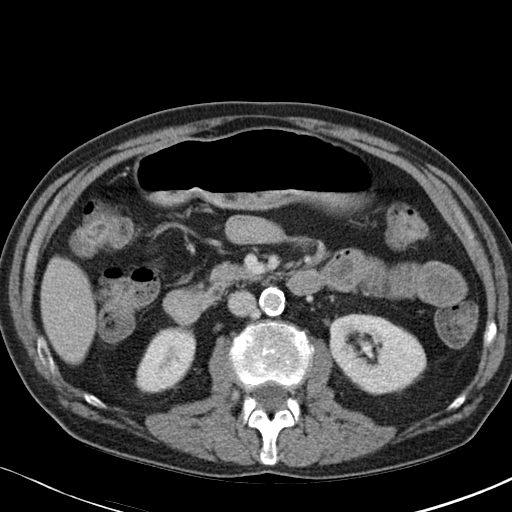
[im 66/97  soft-tissue]
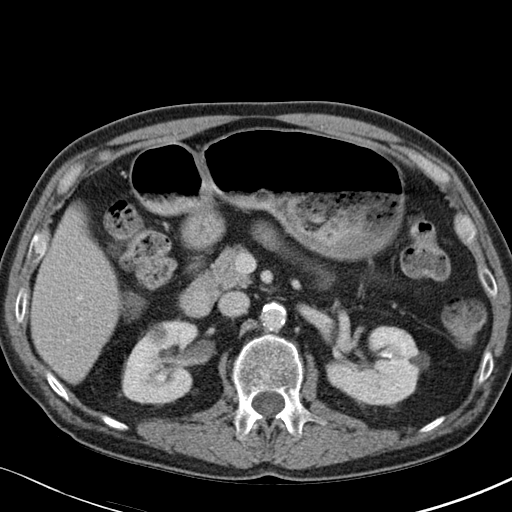
[im 66/97  bone]
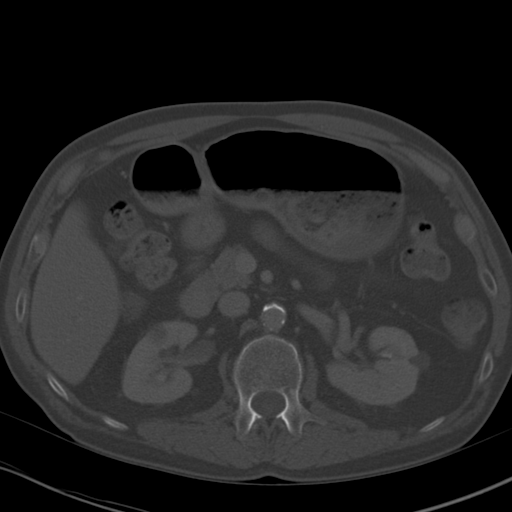
[im 76/97  soft-tissue]
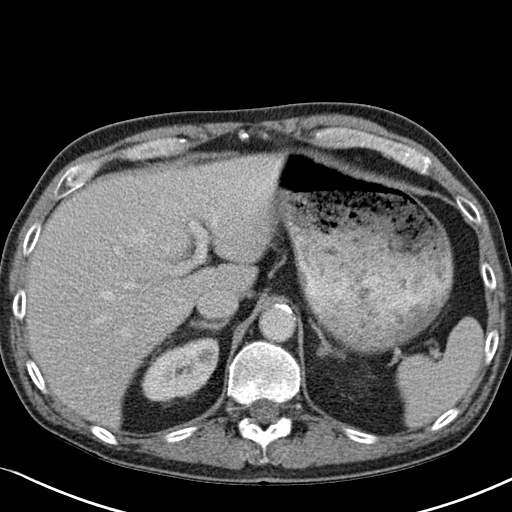
[im 81/97  soft-tissue]
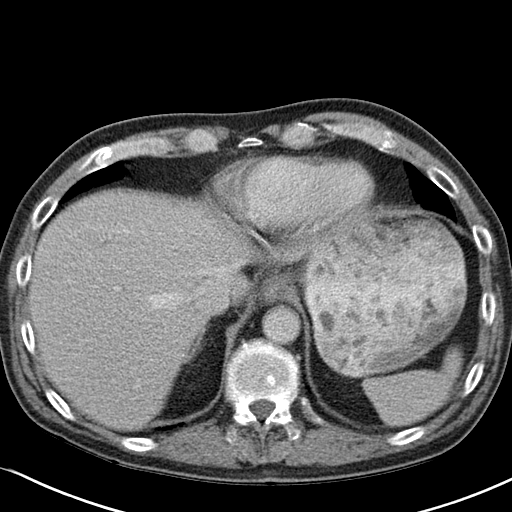
[im 91/97  soft-tissue]
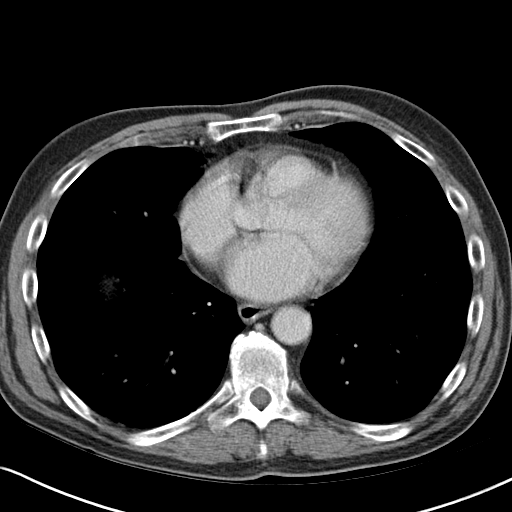

[Series 3: abd_pel_with 3.0 spo cor · coronal · 0.64mm/px · 3 of 87 slices shown]
[im 29/87  soft-tissue]
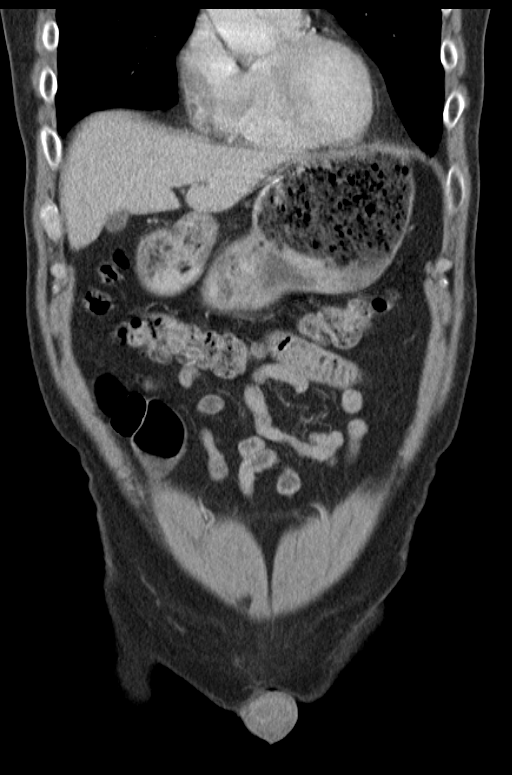
[im 39/87  soft-tissue]
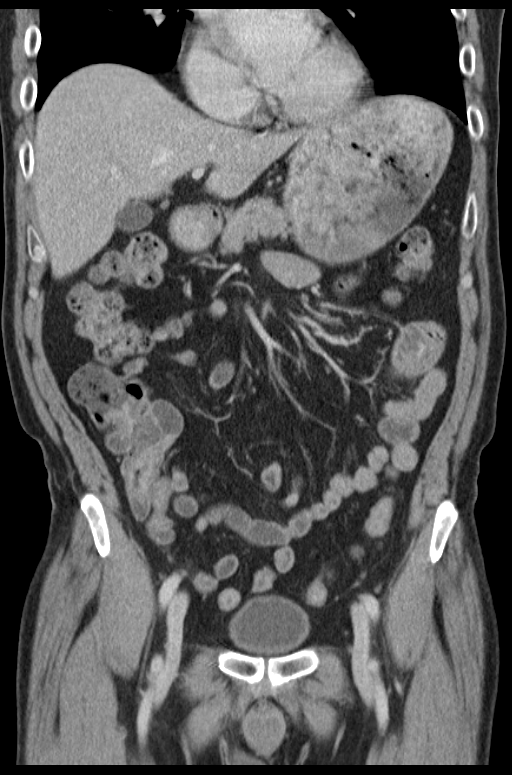
[im 48/87  soft-tissue]
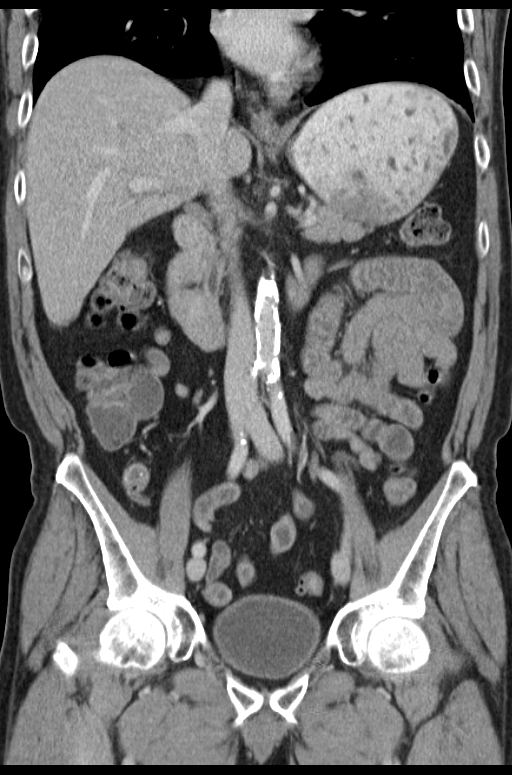

[15 of 46 positions shown; findings below may reference images not displayed]

FINDINGS: LUNG BASES: Included view of the lung bases are clear. Heart size is
normal, mild Coronary artery calcifications. No pericardial
effusions. Mild gas distended esophagus can be seen with reflux.

SOLID ORGANS: The liver, spleen, gallbladder, pancreas and adrenal
glands are unremarkable.

GASTROINTESTINAL TRACT: The stomach, small and large bowel are
normal in course and caliber without inflammatory changes. Mild
descending and sigmoid colonic diverticulosis. Mild amount of
retained large bowel stool. Normal appendix.

KIDNEYS/ URINARY TRACT: Kidneys are orthotopic, demonstrating
symmetric enhancement. No nephrolithiasis, hydronephrosis or solid
renal masses. At least 3 renal cysts on the RIGHT measure up to 17
mm. At least 2 renal cysts on the LEFT measure up to 13 mm. Too
small to characterize hypodensities in the kidneys bilaterally.
Faint early excretion of contrast decreases sensitivity for small
nonobstructing nephrolithiasis. The unopacified ureters are normal
in course and caliber. Delayed imaging through the kidneys
demonstrates symmetric prompt contrast excretion within the proximal
urinary collecting system. Urinary bladder is well distended and
nonsuspicious, urachal remnant.

PERITONEUM/RETROPERITONEUM: Aortoiliac vessels are normal in course
and caliber, moderate to severe calcific atherosclerosis. No
lymphadenopathy by CT size criteria. Prostate size is normal. No
intraperitoneal free fluid nor free air.

SOFT TISSUE/OSSEOUS STRUCTURES: Non-suspicious. No umbilical hernia.
Moderate L2-3 degenerative disc with Schmorl's nodes.
IMPRESSION: No acute intra-abdominal or pelvic process.

Mild colonic diverticulosis.

## 2017-01-19 DIAGNOSIS — R070 Pain in throat: Secondary | ICD-10-CM | POA: Insufficient documentation

## 2017-01-22 ENCOUNTER — Encounter: Payer: Self-pay | Admitting: Cardiovascular Disease

## 2017-01-22 ENCOUNTER — Ambulatory Visit: Payer: Medicare Other | Admitting: Cardiovascular Disease

## 2017-01-22 VITALS — BP 162/72 | HR 73 | Ht 68.0 in | Wt 150.0 lb

## 2017-01-22 DIAGNOSIS — I480 Paroxysmal atrial fibrillation: Secondary | ICD-10-CM | POA: Diagnosis not present

## 2017-01-22 DIAGNOSIS — I10 Essential (primary) hypertension: Secondary | ICD-10-CM

## 2017-01-22 DIAGNOSIS — E7800 Pure hypercholesterolemia, unspecified: Secondary | ICD-10-CM | POA: Diagnosis not present

## 2017-01-22 NOTE — Assessment & Plan Note (Signed)
History of proximal atrial fibrillation maintaining sinus rhythm on Eliquis oral anticoagulation.

## 2017-01-22 NOTE — Patient Instructions (Signed)
Medication Instructions: Your physician recommends that you continue on your current medications as directed. Please refer to the Current Medication list given to you today.   Follow-Up: We request that you follow-up in: 6 months with Rhonda Barrett, PA and in 12 months with Dr Berry  You will receive a reminder letter in the mail two months in advance. If you don't receive a letter, please call our office to schedule the follow-up appointment.  If you need a refill on your cardiac medications before your next appointment, please call your pharmacy.  

## 2017-01-22 NOTE — Assessment & Plan Note (Signed)
History of essential hypertension blood pressure 162/72. He is on diltiazem and metoprolol. Continue current meds (

## 2017-01-22 NOTE — Assessment & Plan Note (Signed)
History of hyperlipidemia on statin therapy followed by his PCP 

## 2017-01-22 NOTE — Progress Notes (Signed)
01/22/2017 [name]   Jul 22, 1945  [id number]  Primary Physician Glenda Chroman, MD Primary Cardiologist: Lorretta Harp MD [name], [name]  HPI:  [name] is a 71 y.o. male well-appearing Caucasian male who I saw /25/18. He has a history of hypertension and hyperlipidemia. He had atypical chest pain here tone with a negative Myoview 07/09/12. His pain improved with proton pump inhibition. Because of recurrent chest pain he saw [name], PA-C in the office who did obtain a 2-D echo and Myoview perfusion study all normal. His pain has improved again on a PPI suggesting that it was related to GERD. Since I saw [name] in the office a year ago he has been well until this past Christmas when he was admitted to [facility] with A. fib with RVR. He converted to normal sinus rhythm with IV diltiazem and was converted to by mouth Cardizem. His troponins were negative and 2-D echo was normal. Because of his elevated CHA2DsVASC2 score of 2 he was begun on Eliquis oral anticoagulation. He saw Roderic Palau in the A. fib clinic 04/19/16 because of palpitations. He says he occasionally feels his heart skip or missed AB. She also had a more prolonged episode of PAF lasting 20 seconds. She gave him when necessary diltiazem 30 mg a day for persistently elevated heart rate. He was just seen in the emergency room 07/07/16 by Dr. Ellender Hose. He was complaining of palpitations and was noted to have PVCs. Since I saw him 6 months ago he's done well. He has had some reflux on Protonix. Otherwise he has been relatively asymptomatic.    Current Meds  Medication Sig  . acetaminophen (TYLENOL) 500 MG tablet Take 250 mg by mouth every 6 (six) hours as needed for headache (pain).  Marland Kitchen apixaban (ELIQUIS) 5 MG TABS tablet Take 1 tablet (5 mg total) by mouth 2 (two) times daily.  Marland Kitchen atorvastatin (LIPITOR) 10 MG tablet Take 10 mg by mouth at bedtime.   Marland Kitchen dexlansoprazole (DEXILANT) 60 MG capsule Take  1 capsule (60 mg total) by mouth daily.  Marland Kitchen diltiazem (CARDIZEM CD) 180 MG 24 hr capsule Take 1 capsule (180 mg total) by mouth daily.  Marland Kitchen diltiazem (CARDIZEM) 30 MG tablet Take 1 tablet every 4 hours AS NEEDED for afib heart rate >100 (Patient taking differently: Take 30 mg by mouth See admin instructions. Take 1 tablet (30 mg) by mouth every 4 hours as needed for AFIB heart rate >100)  . metoprolol tartrate (LOPRESSOR) 25 MG tablet Take 2 (two) times daily by mouth. Pt takes 12.5 mg two times daily  . ranitidine (ZANTAC) 150 MG capsule Take 1 capsule (150 mg total) by mouth 2 (two) times daily.     Allergies  Allergen Reactions  . Lisinopril Swelling    H/o of angioedema   . Chlorthalidone Other (See Comments)    Causes severe hyponatremia  . Hyoscyamine Swelling  . Prevacid [Lansoprazole] Swelling  . Valium [Diazepam] Other (See Comments)    "makes me feel crazy"    Social History   Socioeconomic History  . Marital status: Married    Spouse name: Not on file  . Number of children: Not on file  . Years of education: Not on file  . Highest education level: Not on file  Social Needs  . Financial resource strain: Not on file  . Food insecurity - worry: Not on file  . Food insecurity - inability: Not on  file  . Transportation needs - medical: Not on file  . Transportation needs - non-medical: Not on file  Occupational History  . Not on file  Tobacco Use  . Smoking status: Never Smoker  . Smokeless tobacco: Never Used  Substance and Sexual Activity  . Alcohol use: No  . Drug use: No  . Sexual activity: Not on file  Other Topics Concern  . Not on file  Social History Narrative   ** Merged History Encounter **         Review of Systems: General: negative for chills, fever, night sweats or weight changes.  Cardiovascular: negative for chest pain, dyspnea on exertion, edema, orthopnea, palpitations, paroxysmal nocturnal dyspnea or shortness of breath Dermatological:  negative for rash Respiratory: negative for cough or wheezing Urologic: negative for hematuria Abdominal: negative for nausea, vomiting, diarrhea, bright red blood per rectum, melena, or hematemesis Neurologic: negative for visual changes, syncope, or dizziness All other systems reviewed and are otherwise negative except as noted above.    Blood pressure (!) 162/72, pulse 73, height 5\' 8"  (1.727 m), weight 150 lb (68 kg).  General appearance: alert and no distress Neck: no adenopathy, no carotid bruit, no JVD, supple, symmetrical, trachea midline and thyroid not enlarged, symmetric, no tenderness/mass/nodules Lungs: clear to auscultation bilaterally Heart: regular rate and rhythm, S1, S2 normal, no murmur, click, rub or gallop Extremities: extremities normal, atraumatic, no cyanosis or edema Pulses: 2+ and symmetric Skin: Skin color, texture, turgor normal. No rashes or lesions Neurologic: Alert and oriented X 3, normal strength and tone. Normal symmetric reflexes. Normal coordination and gait  EKG sinus rhythm at 73 with incomplete right bundle-branch block. I personally reviewed this EKG.  ASSESSMENT AND PLAN:   Hyperlipidemia History of hyperlipidemia on statin therapy followed by his PCP  HTN (hypertension) History of essential hypertension blood pressure 162/72. He is on diltiazem and metoprolol. Continue current meds (  Atrial fibrillation ([facility]) History of proximal atrial fibrillation maintaining sinus rhythm on Eliquis oral anticoagulation.      Lorretta Harp MD FACP,FACC,FAHA, [facility] 01/22/2017 10:32 AM

## 2017-03-29 ENCOUNTER — Telehealth: Payer: Self-pay | Admitting: Cardiovascular Disease

## 2017-03-29 ENCOUNTER — Other Ambulatory Visit: Payer: Self-pay | Admitting: Cardiovascular Disease

## 2017-03-29 MED ORDER — APIXABAN 5 MG PO TABS: 5.0000 mg | ORAL_TABLET | Freq: Two times a day (BID) | ORAL | 6 refills | Status: DC

## 2017-03-29 NOTE — Telephone Encounter (Signed)
Follow up     Patient only has 2 pills left needs to have prescription called into [facility] [place] asap   30 day

## 2017-03-29 NOTE — Telephone Encounter (Signed)
New message  Pt verbalized that he is calling for RN

## 2017-03-29 NOTE — Telephone Encounter (Signed)
Spoke to pt. Sent Rx for eliquis to preferred pharmacy. Pt states he will give me a call back if the pharmacy does not receive the Rx. Pt verbalized thanks.

## 2017-03-29 NOTE — Telephone Encounter (Signed)
New Message   *STAT* If patient is at the pharmacy, call can be transferred to refill team.   1. Which medications need to be refilled? (please list name of each medication and dose if known) Eliquis 5mg   2. Which pharmacy/location (including street and city if local pharmacy) is medication to be sent to? [facility] [place]   3. Do they need a 30 day or 90 day supply? [place]

## 2017-04-01 ENCOUNTER — Other Ambulatory Visit: Payer: Self-pay | Admitting: Pharmacist

## 2017-04-01 MED ORDER — APIXABAN 5 MG PO TABS: 5.0000 mg | ORAL_TABLET | Freq: Two times a day (BID) | ORAL | 2 refills | Status: DC

## 2017-04-04 ENCOUNTER — Other Ambulatory Visit: Payer: Self-pay | Admitting: *Deleted

## 2017-04-04 ENCOUNTER — Other Ambulatory Visit: Payer: Self-pay | Admitting: Cardiovascular Disease

## 2017-04-04 MED ORDER — DILTIAZEM HCL ER COATED BEADS 180 MG PO CP24: 180.0000 mg | ORAL_CAPSULE | Freq: Every day | ORAL | 12 refills | Status: DC

## 2017-04-04 MED ORDER — DILTIAZEM HCL 30 MG PO TABS: 30.0000 mg | ORAL_TABLET | ORAL | 11 refills | Status: DC

## 2017-04-04 NOTE — Telephone Encounter (Signed)
°*  STAT* If patient is at the pharmacy, call can be transferred to refill team.   1. Which medications need to be refilled? (please list name of each medication and dose if known) [place]  2. Which pharmacy/location (including street and city if local pharmacy) is medication to be sent to?[facility] [phone]  3. Do they need a 30 day or 90 day supply? 30 and refills

## 2017-04-04 NOTE — Telephone Encounter (Signed)
Follow up   [name] is calling back about refills for [facility].

## 2017-04-04 NOTE — Addendum Note (Signed)
Addended by: Venetia Maxon on: 04/04/2017 02:05 PM   Modules accepted: Orders

## 2017-05-27 ENCOUNTER — Ambulatory Visit: Payer: Medicare Other | Admitting: Orthopedic Surgery

## 2017-06-17 ENCOUNTER — Encounter: Payer: Self-pay | Admitting: Physician Assistant

## 2017-06-17 ENCOUNTER — Ambulatory Visit: Payer: Medicare Other | Admitting: Physician Assistant

## 2017-06-17 ENCOUNTER — Telehealth: Payer: Self-pay | Admitting: Cardiovascular Disease

## 2017-06-17 VITALS — BP 174/82 | HR 74 | Ht 68.0 in | Wt 154.6 lb

## 2017-06-17 DIAGNOSIS — I480 Paroxysmal atrial fibrillation: Secondary | ICD-10-CM

## 2017-06-17 DIAGNOSIS — I10 Essential (primary) hypertension: Secondary | ICD-10-CM

## 2017-06-17 DIAGNOSIS — Z7901 Long term (current) use of anticoagulants: Secondary | ICD-10-CM | POA: Diagnosis not present

## 2017-06-17 MED ORDER — METOPROLOL TARTRATE 25 MG PO TABS: 25.0000 mg | ORAL_TABLET | Freq: Two times a day (BID) | ORAL | 3 refills | Status: DC

## 2017-06-17 NOTE — Telephone Encounter (Signed)
received call from patient stating he has been feeling "bad" for 3 days.   Reports BP has been elevated.   This AM 177/85 HR 77 (has not taken AM meds), HA, no blurred vision or CP.   States he got his medication filled at [facility] (metoprolol tartrate) and thinks it is a different medication because it looks different from his pills prior.    Advised it should be same medication but can look different based on the manufacturer.    Patient states he felt so bad last night he almost went to the ER.  Advised he is scheduled for the first avaliable today at 1:30 with PA, advised to take AM medications, rest and if symptoms worsen to go to ER for evaluation.     Advised to bring all medications with him to appt.

## 2017-06-17 NOTE — Telephone Encounter (Signed)
New Message °

## 2017-06-17 NOTE — Patient Instructions (Signed)
Medication Instructions:  INCREASE metoprolol tartrate (Lopressor) to 25 mg two times daily  Follow-Up: Dr. [name] as planned-Due for visit in November 2019  Any Other Special Instructions Will Be Listed Below (If Applicable).     If you need a refill on your cardiac medications before your next appointment, please call your pharmacy.

## 2017-06-17 NOTE — Progress Notes (Signed)
Cardiology Office Note   Date:  06/17/2017   ID:  [name], [name] February 07, 1946, MRN [id number]  PCP:  Glenda Chroman, MD  Cardiologist:  Dr [name], 01/22/2017  Rosaria Ferries, PA-C 10/10/2016  Chief Complaint  Patient presents with  . Follow-up    History of Present Illness: [name] is a 72 y.o. male with a history of HTN, HLD, PAF on Eliquis, GERD, PACs and PVCs, hyponatremia, probable white coat syndrome, neg MV 2014  04/01 phone notes regarding gen malaise, concern over med (BB looks different), elevated BP>>appt made  [name] presents for cardiology follow up   His BP was doing ok, would be 140-150 when he was quiet. He gets rx at [facility] and they changed the brand of generic metoprolol at his last refill. He noticed this and was concerned. He feels that since then, his BP has been running higher than normal. He has had headaches, taking Tylenol for that. No blurred vision, no presyncope.  No caffeine drinks, drinks water. No chocolate.  No recent illness, no new OTC rx.   No DOE, no LE edema, no orthopnea or PND.  No chest pain  Admits that his nerves are bad, gets anxious about things, especially his health. He is afraid of having a stroke when his BP gets high. When he gets anxious, he will feel his heart speed up. It goes fast, but is regular.   His heart skips occasionally, but not like when he has atrial fib. No recent fib. No bleeding issues on the Eliquis.   Past Medical History:  Diagnosis Date  . Anxiety   . Atrial fibrillation ([place])   . 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 Chest pain    myoview 07/09/12-normal, nl ef; echo 04/12/05-ef>55%, mild aortic sclerosis  . GERD (gastroesophageal reflux disease)   . H. pylori infection   . Headache   . Hypercholesteremia   . Hyperlipidemia   . Hypertension   . Hyponatremia 2013  . Palpitations    event monior 04/11/05- SR with occ PAC  . Personal history of colonic polyps 2 9 2007    Past Surgical  History:  Procedure Laterality Date  . BACK SURGERY    . CATARACT EXTRACTION W/PHACO Left 12/08/2012   Procedure: CATARACT EXTRACTION PHACO AND INTRAOCULAR LENS PLACEMENT (IOC);  Surgeon: Tonny Branch, MD;  Location: [facility] ORS;  Service: Ophthalmology;  Laterality: Left;  CDE:  23.19  . CATARACT EXTRACTION W/PHACO Right 04/15/2014   Procedure: CATARACT EXTRACTION PHACO AND INTRAOCULAR LENS PLACEMENT RIGHT;  Surgeon: Tonny Branch, MD;  Location: [facility] OR[facility];  Service: Ophthalmology;  Laterality: Right;  CDE:10.32  . COLONOSCOPY  12/06/2010   Procedure: COLONOSCOPY;  Surgeon: Rogene Houston, MD;  Location: [facility] SUITE;  Service: Endoscopy;  Laterality: N/A;  . COLONOSCOPY N/A 05/23/2016   Procedure: COLONOSCOPY;  Surgeon: Rogene Houston, MD;  Location: [facility] SU[facility];  Service: Endoscopy;  Laterality: N/A;  930  . ESOPHAGOGASTRODUODENOSCOPY N/A 07/20/2015   Procedure: ESOPHAGOGASTRODUODENOSCOPY (EGD);  Surgeon: Rogene Houston, MD;  Location: [facility];  Service: Endoscopy;  Laterality: N/A;  2:55 - moved to 1:55 - Ann notified pt  . EYE SURGERY    . LUMBAR DISC SURGERY Left 2007   "L4-5 ruptured discs"  . POLYPECTOMY  05/23/2016   Procedure: POLYPECTOMY;  Surgeon: Rogene Houston, MD;  Location: [facility];  Service: Endoscopy;;  colon    Current Outpatient Medications  Medication  Sig Dispense Refill  . acetaminophen (TYLENOL) 500 MG tablet Take 250 mg by mouth every 6 (six) hours as needed for headache (pain).    Marland Kitchen apixaban (ELIQUIS) 5 MG TABS tablet Take 1 tablet (5 mg total) by mouth 2 (two) times daily. 60 tablet 2  . atorvastatin (LIPITOR) 10 MG tablet Take 10 mg by mouth at bedtime.     Marland Kitchen dexlansoprazole (DEXILANT) 60 MG capsule Take 1 capsule (60 mg total) by mouth daily. 90 capsule 3  . diltiazem (CARDIZEM CD) 180 MG 24 hr capsule Take 1 capsule (180 mg total) by mouth daily. 30 capsule 12  . diltiazem (CARDIZEM) 30 MG tablet Take 1 tablet (30 mg total) by mouth See admin instructions. Take 1  tablet (30 mg) by mouth every 4 hours as needed for AFIB heart rate >100 30 tablet 11  . metoprolol tartrate (LOPRESSOR) 25 MG tablet Take 0.5 tablets (12.5 mg total) by mouth 2 (two) times daily. 90 tablet 3  . metoprolol tartrate (LOPRESSOR) 25 MG tablet Take 2 (two) times daily by mouth. Pt takes 12.5 mg two times daily    . ranitidine (ZANTAC) 150 MG capsule Take 1 capsule (150 mg total) by mouth 2 (two) times daily. 60 capsule 5   No current facility-administered medications for this visit.     Allergies:   Lisinopril; Chlorthalidone; Hyoscyamine; Prevacid [lansoprazole]; and Valium [diazepam]    Social History:  The patient  reports that he has never smoked. He has never used smokeless tobacco. He reports that he does not drink alcohol or use drugs.   Family History:  The patient's family history includes Hyperlipidemia in his father; Hypertension in his father.    ROS:  Please see the history of present illness. All other systems are reviewed and negative.    PHYSICAL EXAM: VS:  BP (!) 174/82   Pulse 74   Ht 5\' 8"  (1.727 m)   Wt 154 lb 9.6 oz (70.1 kg)   BMI 23.51 kg/m  , BMI Body mass index is 23.51 kg/m. GEN: Well nourished, well developed, male in no acute distress  HEENT: normal for age  Neck: no JVD, no carotid bruit, no masses Cardiac: RRR; soft murmur, no rubs, or gallops Respiratory:  clear to auscultation bilaterally, normal work of breathing GI: soft, nontender, nondistended, + BS MS: no deformity or atrophy; no edema; distal pulses are 2+ in all 4 extremities   Skin: warm and dry, no rash Neuro:  Strength and sensation are intact Psych: euthymic mood, full affect   EKG:  EKG is not ordered today.   ECHO: 03/14/2016 - Left ventricle: The cavity size was normal. There was moderate   concentric hypertrophy. Systolic function was normal. The   estimated ejection fraction was in the range of 60% to 65%. Wall   motion was normal; there were no regional wall  motion   abnormalities. Doppler parameters are consistent with abnormal   left ventricular relaxation (grade 1 diastolic dysfunction).   There was no evidence of elevated ventricular filling pressure by   Doppler parameters. - Aortic valve: Trileaflet; normal thickness leaflets. There was no   regurgitation. - Aortic root: The aortic root was normal in size. - Ascending aorta: The ascending aorta was normal in size. - Mitral valve: Structurally normal valve. There was no   regurgitation. - Right ventricle: The cavity size was normal. Wall thickness was   normal. Systolic function was normal. - Right atrium: The atrium was normal in size. -  Tricuspid valve: There was trivial regurgitation. - Pulmonary arteries: Systolic pressure was within the normal   range. - Inferior vena cava: The vessel was normal in size. - Pericardium, extracardiac: There was no pericardial effusion.  Recent Labs: 06/29/2016: TSH 3.047 07/07/2016: BUN 8; Creatinine, Ser 0.87; Hemoglobin 13.8; Magnesium 2.2; Platelets 276; Potassium 3.7; Sodium 136    Lipid Panel    Component Value Date/Time   CHOL 172 12/20/2014 0947   CHOL 146 06/17/2014 0938   TRIG 99 12/20/2014 0947   HDL 46 12/20/2014 0947   HDL 47 06/17/2014 0938   CHOLHDL 3.7 12/20/2014 0947   VLDL 20 12/20/2014 0947   LDLCALC 106 12/20/2014 0947   LDLCALC 66 06/17/2014 0938     Wt Readings from Last 3 Encounters:  06/17/17 154 lb 9.6 oz (70.1 kg)  01/22/17 150 lb (68 kg)  12/26/16 150 lb (68 kg)     Other studies Reviewed: Additional studies/ records that were reviewed today include: office notes, hospital records and testing.  ASSESSMENT AND PLAN:  1.  PAF: I believe his blood pressure will tolerate an increase in the metoprolol which will help his heart rate as well.  Continue Cardizem at the current dose.  Report any increase in symptoms.  2.  Hypertension: I explained that I felt his blood pressure is running higher than we would  like it at home, it only comes down into the 130s after he has been sitting and relaxing for several minutes.  Increase the metoprolol to 25 mg twice daily.  See how his blood pressure tolerates this.  3.  Anxiety: He admits that anxiety is likely playing a role, when he sees a high blood pressure reading it upsets him and then subsequent readings are that high or higher.  4.  Anticoagulation: He is not having any bleeding issues on the Eliquis.   Current medicines are reviewed at length with the patient today.  The patient has concerns regarding medicines.  Concerns were addressed  The following changes have been made: Increase metoprolol  Labs/ tests ordered today include:  No orders of the defined types were placed in this encounter.    Disposition:   FU with Dr [name]  Signed, Rosaria Ferries, PA-C  06/17/2017 1:49 PM    [place] Phone: [phone]; Fax: [phone]  This note was written with the assistance of speech recognition software. Please excuse any transcriptional errors.

## 2017-06-26 ENCOUNTER — Telehealth: Payer: Self-pay | Admitting: Physician Assistant

## 2017-06-26 NOTE — Telephone Encounter (Signed)
Returned call to patient.He stated he continues to have headaches.B/P this morning 141/74,144/76 pulse 52,54.Stated he saw Rosaria Ferries PA 06/17/17 she increased Metoprolol to 25 mg twice a day.Advised B/P is better since last visit.Advised to see PCP.Message sent to Rosaria Ferries PA for review.

## 2017-06-26 NOTE — Telephone Encounter (Signed)
New Message   Pt c/o BP issue:  1. What are your last 5 BP readings? 141/74  2. Are you having any other symptoms (ex. Dizziness, headache, blurred vision, passed out)? Headaches  3. What is your medication issue? metoprolol tartrate (LOPRESSOR) 25 MG tablet  Patient says with him going to two a day he is still having headaches. Please call to discuss.

## 2017-06-28 NOTE — Telephone Encounter (Signed)
Left message on both numbers advising patient to contact office.

## 2017-06-28 NOTE — Telephone Encounter (Signed)
Please let him know that his BP is improved, HR is low. He was having HA before the BB increased, do not think his BP is high enough to cause them, based on the readings we have available. See PCP for HA HR is on the low side, if no sx, no problem. Once HA are treated, check BP several times to see if it improves.  If not, med adjustment. Thanks

## 2017-06-28 NOTE — Telephone Encounter (Signed)
Follow up  ° ° °Patient is returning call. Please call to discuss.  °

## 2017-07-01 NOTE — Telephone Encounter (Signed)
Left message for patient to contact office.

## 2017-07-02 ENCOUNTER — Telehealth: Payer: Self-pay | Admitting: Cardiovascular Disease

## 2017-07-02 NOTE — Telephone Encounter (Signed)
Spoke with Pt's wife who states her husband's BP has been fluctuating and he is a bit concerned. This morning BP 131/70 and last night's reading 181/92. He also is experiencing a headache with some fogginess. Pt feels that his medication is the cause of his symptoms and requesting an appointment with [facility]. Appointment made for 07/23/17 at 12pm.

## 2017-07-02 NOTE — Telephone Encounter (Signed)
New Message:     Pt's wife is calling due to pt's BP still running high since last appt last week w/ Barrett. Pt is requesting to see the Pharmacist.

## 2017-07-16 ENCOUNTER — Telehealth: Payer: Self-pay | Admitting: Physician Assistant

## 2017-07-16 NOTE — Telephone Encounter (Signed)
Reviewed chart and not sure why this office would have called unless to remind pt of upcoming appt  07-23-17 at noon./cy Discussed with pt ./cy

## 2017-07-16 NOTE — Telephone Encounter (Signed)
Attempted to call pateint on cellphone and home phone no answer, left message for patient to contact office.

## 2017-07-16 NOTE — Telephone Encounter (Signed)
New Message   Patient is returning call that he received today. Not notes as to why.

## 2017-07-23 ENCOUNTER — Ambulatory Visit (INDEPENDENT_AMBULATORY_CARE_PROVIDER_SITE_OTHER): Payer: Medicare Other | Admitting: Pharmacist Clinician (PhC)/ Clinical Pharmacy Specialist

## 2017-07-23 ENCOUNTER — Encounter: Payer: Self-pay | Admitting: Pharmacist Clinician (PhC)/ Clinical Pharmacy Specialist

## 2017-07-23 DIAGNOSIS — I10 Essential (primary) hypertension: Secondary | ICD-10-CM | POA: Diagnosis not present

## 2017-07-23 NOTE — Progress Notes (Deleted)
HPI:  [name] is a 72 y.o. male patient of Dr [name], with a PMH below who presents today for hypertension clinic evaluation.  We worked with him several times in past years, although some of his hypertension is due to white-coat syndrome.  In addition to hypertension, he suffers from hyperlipidemia, atrial fibrillation, and palpitations. During OV with Ronda Barrett on 06/17/2017 his metoprolol dose was increased to 25mg  BID. We received a phone call from his wife due fluctuations in blood pressure readings during the day. Wife reports normal BP in the mornings and elevated readings in the evenings.     Blood Pressure Goal:  130/80   Current Medications:  Diltiazem 180 mg daily  Metoprolol tartrate 25 mg twice daily  Diltiazem 30 mg prn palpitations  Family Hx:  Father died at 77 from CHF, also had CABG; no other significant family history  Social Hx:  Quit smoking 35+ years ago, no caffeine or alcohol; he also suffers some from claustrophobia (has difficulty with needing to use elevators in our building)  Diet:  Healthy diet, has always watched his sodium closely; eats mostly fish and [place] and plenty of vegetables  Exercise:  Walks 30-45 minutes 3-4 times per week  Home BP readings:  Has checked daily, last 27 readings average 133/69 with range of 123-138/63-74  Intolerances:   Lisinopril - angioedema  Chlorthalidone - hyponatremia  Wt Readings from Last 3 Encounters:  06/17/17 154 lb 9.6 oz (70.1 kg)  01/22/17 150 lb (68 kg)  12/26/16 150 lb (68 kg)   BP Readings from Last 3 Encounters:  06/17/17 (!) 174/82  01/22/17 (!) 162/72  12/26/16 (!) 146/64   Pulse Readings from Last 3 Encounters:  06/17/17 74  01/22/17 73  12/26/16 64    Current Outpatient Medications  Medication Sig Dispense Refill  . acetaminophen (TYLENOL) 500 MG tablet Take 250 mg by mouth every 6 (six) hours as needed for headache (pain).    Marland Kitchen apixaban (ELIQUIS) 5 MG TABS tablet Take 1  tablet (5 mg total) by mouth 2 (two) times daily. 60 tablet 2  . atorvastatin (LIPITOR) 10 MG tablet Take 10 mg by mouth at bedtime.     Marland Kitchen dexlansoprazole (DEXILANT) 60 MG capsule Take 1 capsule (60 mg total) by mouth daily. 90 capsule 3  . diltiazem (CARDIZEM CD) 180 MG 24 hr capsule Take 1 capsule (180 mg total) by mouth daily. 30 capsule 12  . diltiazem (CARDIZEM) 30 MG tablet Take 1 tablet (30 mg total) by mouth See admin instructions. Take 1 tablet (30 mg) by mouth every 4 hours as needed for AFIB heart rate >100 30 tablet 11  . metoprolol tartrate (LOPRESSOR) 25 MG tablet Take 1 tablet (25 mg total) by mouth 2 (two) times daily. 180 tablet 3  . pantoprazole (PROTONIX) 40 MG tablet Take by mouth.     No current facility-administered medications for this visit.     Allergies  Allergen Reactions  . Lisinopril Swelling    H/o of angioedema   . Chlorthalidone Other (See Comments)    Causes severe hyponatremia  . Hyoscyamine Swelling  . Prevacid [Lansoprazole] Swelling  . Valium [Diazepam] Other (See Comments)    "makes me feel crazy"    Past Medical History:  Diagnosis Date  . Anxiety   . Atrial fibrillation ([place])   . 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 Chest pain    myoview 07/09/12-normal, nl ef; echo 04/12/05-ef>55%, mild  aortic sclerosis  . GERD (gastroesophageal reflux disease)   . H. pylori infection   . Headache   . Hypercholesteremia   . Hyperlipidemia   . Hypertension   . Hyponatremia 2013  . Palpitations    event monior 04/11/05- SR with occ PAC  . Personal history of colonic polyps 2 9 2007    There were no vitals taken for this visit.  No problem-specific Assessment & Plan notes found for this encounter.   Russel Morain Rodriguez-Guzman PharmD, BCPS, [place] [address] 07/23/2017 8:41 AM

## 2017-07-23 NOTE — Assessment & Plan Note (Signed)
Patient with white coat hypertension, in office today concerned about occasional elevated home readings.  We had a long discussion about the fact that he will occasionally have these readings and it is perfectly normal.  We have worked in the past to be sure that he does not take his pressure more than 1-2 times a day, regardless of how high it goes.  When it goes high he now takes 250 mg acetaminophen and goes to lie down for a couple of hours.  Since he now has diltiazem 30 mg tablets for episodes of AF, I suggested that if his systolic pressure goes about 170, he can take one of those to bring the pressure down.  He can call should he have any problems with this, and we can see him in the [facility] clinic at any time in the future should he have further worries.

## 2017-07-23 NOTE — Patient Instructions (Signed)
  Your blood pressure today is 160/82  (we know this is due to being in the office)  Check your blood pressure at home several times each week and keep record of the readings.  Take your BP meds as follows:  Continue with your current medications   If your BP goes > 170 try taking 1/2-1 tablet of the diltiazem.  This can help decrease your blood pressure.  (if you go into atrial fibrillation take the whole tablet)  Bring all of your meds, your BP cuff and your record of home blood pressures to your next appointment.  Exercise as you're able, try to walk approximately 30 minutes per day.  Keep salt intake to a minimum, especially watch canned and prepared boxed foods.  Eat more fresh fruits and vegetables and fewer canned items.  Avoid eating in fast food restaurants.    HOW TO TAKE YOUR BLOOD PRESSURE: . Rest 5 minutes before taking your blood pressure. .  Don't smoke or drink caffeinated beverages for at least 30 minutes before. . Take your blood pressure before (not after) you eat. . Sit comfortably with your back supported and both feet on the floor (don't cross your legs). . Elevate your arm to heart level on a table or a desk. . Use the proper sized cuff. It should fit smoothly and snugly around your bare upper arm. There should be enough room to slip a fingertip under the cuff. The bottom edge of the cuff should be 1 inch above the crease of the elbow. . Ideally, take 3 measurements at one sitting and record the average.

## 2017-07-23 NOTE — Progress Notes (Signed)
07/23/2017 ALVA BROXSON 10/20/1945 [id number]   HPI:  [name] is a 72 y.o. male patient of Dr [name], with a PMH below who presents today for hypertension clinic evaluation.  I have worked with him several times in past years, although some of his hypertension is due to white-coat syndrome.  In addition to hypertension, he suffers from hyperlipidemia, atrial fibrillation, and palpitations.  On April 1 he called to report feeling "bad" to the point where he considered going to ER the prior evening.  He was scheduled to see Rosaria Ferries that day.  He complained that his BP was high and he was having headaches.  As usual, his BP was elevated in the office and Rhonda increased his metoprolol to 25 mg bid.  Unfortunately, he believed that the headaches and high blood pressure were caused by a switch in manufacturers of the metoprolol, so he only took the higher dose for 3-4 days.  He then cut back to 1/2 tablet twice daily and was able to get [facility] to special order the original manufacturer.   He is now feeling better.    He also reports some instances of a "fuzzy feeling" in his head recently.  He has had this before and they have learned to associate it with a low sodium level.  He drinks water in excess at times, as much as 8 or more 12-16 oz cups.    Blood Pressure Goal:  130/80   Current Medications:  Diltiazem 180 mg qd  Metoprolol tart 12.5 mg bid  Diltiazem 30 mg prn palpitations  Family Hx:  Father died at 67 from CHF, also had CABG; no other significant family history  Social Hx:  Quit smoking 35+ years ago, no caffeine or alcohol; he also suffers some from claustrophobia (has difficulty with needing to use elevators in our building)  Diet:  Healthy diet, has always watched his sodium closely; eats mostly fish and [place] and plenty of vegetables  Exercise:  Walks 30-45 minutes 3-4 times per week  Home BP readings:  Has checked daily, last 27 readings average 133/69  with range of 123-138/63-74  Intolerances:   Lisinopril - angioedema  Chlorthalidone - hyponatremia  Wt Readings from Last 3 Encounters:  06/17/17 154 lb 9.6 oz (70.1 kg)  01/22/17 150 lb (68 kg)  12/26/16 150 lb (68 kg)   BP Readings from Last 3 Encounters:  07/23/17 (!) 160/82  06/17/17 (!) 174/82  01/22/17 (!) 162/72   Pulse Readings from Last 3 Encounters:  07/23/17 62  06/17/17 74  01/22/17 73    Current Outpatient Medications  Medication Sig Dispense Refill  . acetaminophen (TYLENOL) 500 MG tablet Take 250 mg by mouth every 6 (six) hours as needed for headache (pain).    Marland Kitchen apixaban (ELIQUIS) 5 MG TABS tablet Take 1 tablet (5 mg total) by mouth 2 (two) times daily. 60 tablet 2  . atorvastatin (LIPITOR) 10 MG tablet Take 10 mg by mouth at bedtime.     Marland Kitchen dexlansoprazole (DEXILANT) 60 MG capsule Take 1 capsule (60 mg total) by mouth daily. 90 capsule 3  . diltiazem (CARDIZEM CD) 180 MG 24 hr capsule Take 1 capsule (180 mg total) by mouth daily. 30 capsule 12  . diltiazem (CARDIZEM) 30 MG tablet Take 1 tablet (30 mg total) by mouth See admin instructions. Take 1 tablet (30 mg) by mouth every 4 hours as needed for AFIB heart rate >100 30 tablet 11  . metoprolol  tartrate (LOPRESSOR) 25 MG tablet Take 1 tablet (25 mg total) by mouth 2 (two) times daily. 180 tablet 3  . pantoprazole (PROTONIX) 40 MG tablet Take by mouth.     No current facility-administered medications for this visit.     Allergies  Allergen Reactions  . Lisinopril Swelling    H/o of angioedema   . Chlorthalidone Other (See Comments)    Causes severe hyponatremia  . Hyoscyamine Swelling  . Prevacid [Lansoprazole] Swelling  . Valium [Diazepam] Other (See Comments)    "makes me feel crazy"    Past Medical History:  Diagnosis Date  . Anxiety   . Atrial fibrillation ([place])   . 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 Chest pain    myoview 07/09/12-normal, nl ef; echo 04/12/05-ef>55%, mild aortic sclerosis  . GERD  (gastroesophageal reflux disease)   . H. pylori infection   . Headache   . Hypercholesteremia   . Hyperlipidemia   . Hypertension   . Hyponatremia 2013  . Palpitations    event monior 04/11/05- SR with occ PAC  . Personal history of colonic polyps 2 9 2007    Blood pressure (!) 160/82, pulse 62.  HTN (hypertension) Patient with white coat hypertension, in office today concerned about occasional elevated home readings.  We had a long discussion about the fact that he will occasionally have these readings and it is perfectly normal.  We have worked in the past to be sure that he does not take his pressure more than 1-2 times a day, regardless of how high it goes.  When it goes high he now takes 250 mg acetaminophen and goes to lie down for a couple of hours.  Since he now has diltiazem 30 mg tablets for episodes of AF, I suggested that if his systolic pressure goes about 170, he can take one of those to bring the pressure down.  He can call should he have any problems with this, and we can see him in the [facility] clinic at any time in the future should he have further worries.     Tommy Medal PharmD CPP [facility]

## 2017-08-23 ENCOUNTER — Telehealth: Payer: Self-pay | Admitting: Cardiovascular Disease

## 2017-08-23 NOTE — Telephone Encounter (Signed)
Spoke to patient's wife.  She states patient has episode -in which patient at times  becomes quiet ,  Does not respond to his wife. She states he gets foggy look on his face. Patient has a appointment 08/27/17.  Wife is concerned - wanted to know if patient was to higher dose of medication.  -- cardiac wise patient taking metoprolol 12.5 mg twice a day. cardizem 180 daily..  RN TO KEEP APPOINTMENT ON 08/27/17 with Dr [name].  rn recommend to have patient reevaluated at hospital if symptoms reoccur - -if   unable to speak, drooping face or extremities, speech issues. Wife verbalized understanding.

## 2017-08-23 NOTE — Telephone Encounter (Signed)
LEFT MESSAGE TO CALL BACK AT PATIENT 'S WIFE NUMBER AND PATIENT MOBILE NUMBER.

## 2017-08-23 NOTE — Telephone Encounter (Signed)
New Message   Pt c/o medication issue:  1. Name of Medication: Cartia XT, metoprolol tartrate (LOPRESSOR) 25 MG tablet  2. How are you currently taking this medication (dosage and times per day)?   3. Are you having a reaction (difficulty breathing--STAT)?   4. What is your medication issue? Patients wife is calling in because the patient is experiencing a headache and times of being out of it. The only other description is a funny feeling.  Please call to discuss.

## 2017-08-27 ENCOUNTER — Ambulatory Visit: Payer: Medicare Other | Admitting: Cardiovascular Disease

## 2017-08-27 ENCOUNTER — Encounter: Payer: Self-pay | Admitting: *Deleted

## 2017-08-27 ENCOUNTER — Encounter: Payer: Self-pay | Admitting: Cardiovascular Disease

## 2017-08-27 VITALS — BP 158/70 | HR 63 | Ht 68.0 in | Wt 152.0 lb

## 2017-08-27 DIAGNOSIS — E7800 Pure hypercholesterolemia, unspecified: Secondary | ICD-10-CM | POA: Diagnosis not present

## 2017-08-27 DIAGNOSIS — I679 Cerebrovascular disease, unspecified: Secondary | ICD-10-CM | POA: Diagnosis not present

## 2017-08-27 DIAGNOSIS — I480 Paroxysmal atrial fibrillation: Secondary | ICD-10-CM

## 2017-08-27 LAB — HEPATIC FUNCTION PANEL
ALT: 21 IU/L (ref 0–44)
AST: 18 IU/L (ref 0–40)
Albumin: 4.6 g/dL (ref 3.5–4.8)
Alkaline Phosphatase: 109 IU/L (ref 39–117)
BILIRUBIN TOTAL: 0.3 mg/dL (ref 0.0–1.2)
BILIRUBIN, DIRECT: 0.09 mg/dL (ref 0.00–0.40)
Total Protein: 7.4 g/dL (ref 6.0–8.5)

## 2017-08-27 LAB — LIPID PANEL
CHOLESTEROL TOTAL: 174 mg/dL (ref 100–199)
Chol/HDL Ratio: 3.4 ratio (ref 0.0–5.0)
HDL: 51 mg/dL (ref >39)
LDL CALC: 93 mg/dL (ref 0–99)
TRIGLYCERIDES: 150 mg/dL — AB (ref 0–149)
VLDL Cholesterol Cal: 30 mg/dL (ref 5–40)

## 2017-08-27 NOTE — Progress Notes (Signed)
08/27/2017 [name]   1945/04/17  [id number]  Primary Physician Glenda Chroman, MD Primary Cardiologist: Lorretta Harp MD FACP, [place], [place], [name]  HPI:  [name] is a 72 y.o.  well-appearing Caucasian male who I last saw in the office 01/22/2017.  He is accompanied by his wife today.Marland Kitchen He has a history of hypertension and hyperlipidemia. He had atypical chest pain here tone with a negative Myoview4/23/14. His pain improved with proton pump inhibition. Because of recurrent chest pain he saw [name], PA-C in the office who did obtain a 2-D echo and Myoview perfusion study all normal. His pain has improved again on a PPI suggesting that it was related to GERD. Since I saw [name] in the office a year ago he has been well until this past Christmas when he was admitted to [facility] with A. fib with RVR. He converted to normal sinus rhythm with IV diltiazem and was converted to by mouth Cardizem. His troponins were negative and 2-D echo was normal. Because of his elevated CHA2DsVASC2 score of 2 he was begun on Eliquis oral anticoagulation. He saw Roderic Palau in the A. fib clinic 04/19/16 because of palpitations. He says he occasionally feels his heart skip or missed AB. She also had a more prolonged episode of PAF lasting 20 seconds. She gave him when necessary diltiazem 30 mg a day for persistently elevated heart rate.He was just seen in the emergency room 07/07/16 by Dr. Ellender Hose. He was complaining of palpitations and was noted to have PVCs.  Since I saw him in the office 6 months ago he is remained stable.  He has some atypical "neurologic symptoms versus cognitive changes which do not sound cardiovascular nature.  His blood pressure readings at home appear well-controlled.  He apparently had carotid Dopplers performed by his PCP 9 years ago that showed "a 50% blockage".     Current Meds  Medication Sig  . acetaminophen (TYLENOL) 500 MG tablet Take 250 mg by mouth  every 6 (six) hours as needed for headache (pain).  Marland Kitchen apixaban (ELIQUIS) 5 MG TABS tablet Take 1 tablet (5 mg total) by mouth 2 (two) times daily.  Marland Kitchen atorvastatin (LIPITOR) 10 MG tablet Take 10 mg by mouth at bedtime.   Marland Kitchen dexlansoprazole (DEXILANT) 60 MG capsule Take 1 capsule (60 mg total) by mouth daily.  Marland Kitchen diltiazem (CARDIZEM CD) 180 MG 24 hr capsule Take 1 capsule (180 mg total) by mouth daily.  Marland Kitchen diltiazem (CARDIZEM) 30 MG tablet Take 1 tablet (30 mg total) by mouth See admin instructions. Take 1 tablet (30 mg) by mouth every 4 hours as needed for AFIB heart rate >100  . metoprolol tartrate (LOPRESSOR) 25 MG tablet Take 1 tablet (25 mg total) by mouth 2 (two) times daily.  . pantoprazole (PROTONIX) 40 MG tablet Take by mouth.     Allergies  Allergen Reactions  . Lisinopril Swelling    H/o of angioedema   . Chlorthalidone Other (See Comments)    Causes severe hyponatremia  . Hyoscyamine Swelling  . Prevacid [Lansoprazole] Swelling  . Valium [Diazepam] Other (See Comments)    "makes me feel crazy"    Social History   Socioeconomic History  . Marital status: Married    Spouse name: Not on file  . Number of children: Not on file  . Years of education: Not on file  . Highest education level: Not on file  Occupational History  . Not  on file  Social Needs  . Financial resource strain: Not on file  . Food insecurity:    Worry: Not on file    Inability: Not on file  . Transportation needs:    Medical: Not on file    Non-medical: Not on file  Tobacco Use  . Smoking status: Never Smoker  . Smokeless tobacco: Never Used  Substance and Sexual Activity  . Alcohol use: No  . Drug use: No  . Sexual activity: Not on file  Lifestyle  . Physical activity:    Days per week: Not on file    Minutes per session: Not on file  . Stress: Not on file  Relationships  . Social connections:    Talks on phone: Not on file    Gets together: Not on file    Attends religious service: Not  on file    Active member of club or organization: Not on file    Attends meetings of clubs or organizations: Not on file    Relationship status: Not on file  . Intimate partner violence:    Fear of current or ex partner: Not on file    Emotionally abused: Not on file    Physically abused: Not on file    Forced sexual activity: Not on file  Other Topics Concern  . Not on file  Social History Narrative   ** Merged History Encounter **         Review of Systems: General: negative for chills, fever, night sweats or weight changes.  Cardiovascular: negative for chest pain, dyspnea on exertion, edema, orthopnea, palpitations, paroxysmal nocturnal dyspnea or shortness of breath Dermatological: negative for rash Respiratory: negative for cough or wheezing Urologic: negative for hematuria Abdominal: negative for nausea, vomiting, diarrhea, bright red blood per rectum, melena, or hematemesis Neurologic: negative for visual changes, syncope, or dizziness All other systems reviewed and are otherwise negative except as noted above.    Blood pressure (!) 158/70, pulse 63, height 5\' 8"  (1.727 m), weight 152 lb (68.9 kg).  General appearance: alert and no distress Neck: no adenopathy, no carotid bruit, no JVD, supple, symmetrical, trachea midline and thyroid not enlarged, symmetric, no tenderness/mass/nodules Lungs: clear to auscultation bilaterally Heart: regular rate and rhythm, S1, S2 normal, no murmur, click, rub or gallop Extremities: extremities normal, atraumatic, no cyanosis or edema Pulses: 2+ and symmetric Skin: Skin color, texture, turgor normal. No rashes or lesions Neurologic: Alert and oriented X 3, normal strength and tone. Normal symmetric reflexes. Normal coordination and gait  EKG not performed today  ASSESSMENT AND PLAN:   Hyperlipidemia Story of hyperlipidemia on statin therapy.  We will recheck a lipid and liver profile  HTN (hypertension) History of essential  hypertension her blood pressure measured today at 158/70.  He is on diltiazem metoprolol.  His blood pressures at home are much better controlled and I suspect he has an element of "whitecoat hypertension".  Atrial fibrillation ([place]) History of infrequent episodes of proximal atrial fibrillation on Eliquis oral anticoagulation.      Lorretta Harp MD FACP,FACC,FAHA, [facility] 08/27/2017 8:53 AM

## 2017-08-27 NOTE — Assessment & Plan Note (Signed)
History of infrequent episodes of proximal atrial fibrillation on Eliquis oral anticoagulation.

## 2017-08-27 NOTE — Patient Instructions (Signed)
Medication Instructions:   NO CHANGE  Labwork:  Your physician recommends that you HAVE LAB WORK TODAY  Testing/Procedures:  Your physician has requested that you have a carotid duplex. This test is an ultrasound of the carotid arteries in your neck. It looks at blood flow through these arteries that supply the brain with blood. Allow one hour for this exam. There are no restrictions or special instructions.    Follow-Up:  Your physician wants you to follow-up in: [place] will receive a reminder letter in the mail two months in advance. If you don't receive a letter, please call our office to schedule the follow-up appointment.   Your physician wants you to follow-up in: [place] will receive a reminder letter in the mail two months in advance. If you don't receive a letter, please call our office to schedule the follow-up appointment.   If you need a refill on your cardiac medications before your next appointment, please call your pharmacy.

## 2017-08-27 NOTE — Assessment & Plan Note (Signed)
Story of hyperlipidemia on statin therapy.  We will recheck a lipid and liver profile

## 2017-08-27 NOTE — Assessment & Plan Note (Signed)
History of essential hypertension her blood pressure measured today at 158/70.  He is on diltiazem metoprolol.  His blood pressures at home are much better controlled and I suspect he has an element of "whitecoat hypertension".

## 2017-08-29 ENCOUNTER — Ambulatory Visit (HOSPITAL_COMMUNITY)
Admission: RE | Admit: 2017-08-29 | Discharge: 2017-08-29 | Disposition: A | Discharge status: Home / Self Care | Payer: Medicare Other | Source: Ambulatory Visit | Attending: Cardiology | Admitting: Cardiology

## 2017-08-29 DIAGNOSIS — I679 Cerebrovascular disease, unspecified: Secondary | ICD-10-CM | POA: Diagnosis not present

## 2017-08-30 ENCOUNTER — Other Ambulatory Visit: Payer: Self-pay | Admitting: Internal Medicine

## 2017-08-30 DIAGNOSIS — R42 Dizziness and giddiness: Secondary | ICD-10-CM

## 2017-09-03 ENCOUNTER — Ambulatory Visit
Admission: RE | Admit: 2017-09-03 | Discharge: 2017-09-03 | Disposition: A | Discharge status: Home / Self Care | Payer: Medicare Other | Source: Ambulatory Visit | Attending: Internal Medicine | Admitting: Internal Medicine

## 2017-09-03 DIAGNOSIS — R42 Dizziness and giddiness: Secondary | ICD-10-CM

## 2017-11-26 ENCOUNTER — Telehealth: Payer: Self-pay | Admitting: Pharmacist Clinician (PhC)/ Clinical Pharmacy Specialist

## 2017-11-26 NOTE — Telephone Encounter (Signed)
Patient in donut hole with insurance.  Samples x 4 given.

## 2017-12-24 ENCOUNTER — Ambulatory Visit (INDEPENDENT_AMBULATORY_CARE_PROVIDER_SITE_OTHER): Payer: Medicare Other | Admitting: Internal Medicine

## 2017-12-31 ENCOUNTER — Ambulatory Visit (INDEPENDENT_AMBULATORY_CARE_PROVIDER_SITE_OTHER): Payer: Medicare Other | Admitting: Internal Medicine

## 2018-01-24 ENCOUNTER — Other Ambulatory Visit: Payer: Self-pay

## 2018-01-24 ENCOUNTER — Emergency Department (HOSPITAL_COMMUNITY)
Admission: EM | Admit: 2018-01-24 | Discharge: 2018-01-24 | Disposition: A | Discharge status: Home / Self Care | Payer: Medicare Other | Attending: Emergency Medicine | Admitting: Emergency Medicine

## 2018-01-24 ENCOUNTER — Encounter (HOSPITAL_COMMUNITY): Payer: Self-pay | Admitting: Emergency Medicine

## 2018-01-24 ENCOUNTER — Emergency Department (HOSPITAL_COMMUNITY): Payer: Medicare Other

## 2018-01-24 DIAGNOSIS — I10 Essential (primary) hypertension: Secondary | ICD-10-CM | POA: Insufficient documentation

## 2018-01-24 DIAGNOSIS — Z79899 Other long term (current) drug therapy: Secondary | ICD-10-CM | POA: Insufficient documentation

## 2018-01-24 DIAGNOSIS — Z8547 Personal history of malignant neoplasm of testis: Secondary | ICD-10-CM | POA: Diagnosis not present

## 2018-01-24 DIAGNOSIS — Z7901 Long term (current) use of anticoagulants: Secondary | ICD-10-CM | POA: Insufficient documentation

## 2018-01-24 DIAGNOSIS — R079 Chest pain, unspecified: Secondary | ICD-10-CM | POA: Insufficient documentation

## 2018-01-24 LAB — CBC
HCT: 41 % (ref 39.0–52.0)
Hemoglobin: 13.2 g/dL (ref 13.0–17.0)
MCH: 28.8 pg (ref 26.0–34.0)
MCHC: 32.2 g/dL (ref 30.0–36.0)
MCV: 89.3 fL (ref 80.0–100.0)
PLATELETS: 300 10*3/uL (ref 150–400)
RBC: 4.59 MIL/uL (ref 4.22–5.81)
RDW: 12.9 % (ref 11.5–15.5)
WBC: 8.3 10*3/uL (ref 4.0–10.5)
nRBC: 0 % (ref 0.0–0.2)

## 2018-01-24 LAB — BASIC METABOLIC PANEL
Anion gap: 8 (ref 5–15)
BUN: 8 mg/dL (ref 8–23)
CALCIUM: 9.3 mg/dL (ref 8.9–10.3)
CO2: 26 mmol/L (ref 22–32)
Chloride: 102 mmol/L (ref 98–111)
Creatinine, Ser: 0.91 mg/dL (ref 0.61–1.24)
GFR calc Af Amer: >60 mL/min (ref >60)
GFR calc non Af Amer: >60 mL/min (ref >60)
GLUCOSE: 118 mg/dL — AB (ref 70–99)
Potassium: 4 mmol/L (ref 3.5–5.1)
Sodium: 136 mmol/L (ref 135–145)

## 2018-01-24 LAB — I-STAT TROPONIN, ED: Troponin i, poc: 0 ng/mL (ref 0.00–0.08)

## 2018-01-24 LAB — TROPONIN I: Troponin I: <0.03 ng/mL (ref <0.03)

## 2018-01-24 MED ORDER — FAMOTIDINE IN NACL 20-0.9 MG/50ML-% IV SOLN
20.0000 mg | Freq: Once | INTRAVENOUS | Status: DC
  Filled 2018-01-24: qty 50

## 2018-01-24 MED ORDER — SODIUM CHLORIDE 0.9 % IV BOLUS
500.0000 mL | Freq: Once | INTRAVENOUS | Status: AC
  Administered 2018-01-24: 500 mL via INTRAVENOUS

## 2018-01-24 NOTE — ED Triage Notes (Signed)
Patient complaining of chest pain starting around lunch time today.

## 2018-01-24 NOTE — Discharge Instructions (Addendum)
Follow-up with your cardiologist and gastroenterologist.  Tests showed no life-threatening condition.
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
## 2018-01-29 ENCOUNTER — Encounter (INDEPENDENT_AMBULATORY_CARE_PROVIDER_SITE_OTHER): Payer: Self-pay | Admitting: Internal Medicine

## 2018-01-29 ENCOUNTER — Ambulatory Visit (INDEPENDENT_AMBULATORY_CARE_PROVIDER_SITE_OTHER): Payer: Medicare Other | Admitting: Internal Medicine

## 2018-01-29 VITALS — BP 164/70 | HR 65 | Temp 97.8°F | Ht 68.0 in | Wt 146.3 lb

## 2018-01-29 DIAGNOSIS — K219 Gastro-esophageal reflux disease without esophagitis: Secondary | ICD-10-CM | POA: Diagnosis not present

## 2018-01-29 NOTE — Patient Instructions (Addendum)
PR in 2 weeks.  Gastroesophageal Reflux Disease, Adult Normally, food travels down the esophagus and stays in the stomach to be digested. However, when a person has gastroesophageal reflux disease (GERD), food and stomach acid move back up into the esophagus. When this happens, the esophagus becomes sore and inflamed. Over time, GERD can create small holes (ulcers) in the lining of the esophagus. What are the causes? This condition is caused by a problem with the muscle between the esophagus and the stomach (lower esophageal sphincter, or LES). Normally, the LES muscle closes after food passes through the esophagus to the stomach. When the LES is weakened or abnormal, it does not close properly, and that allows food and stomach acid to go back up into the esophagus. The LES can be weakened by certain dietary substances, medicines, and medical conditions, including:  Tobacco use.  Pregnancy.  Having a hiatal hernia.  Heavy alcohol use.  Certain foods and beverages, such as coffee, chocolate, onions, and peppermint.  What increases the risk? This condition is more likely to develop in:  People who have an increased body weight.  People who have connective tissue disorders.  People who use NSAID medicines.  What are the signs or symptoms? Symptoms of this condition include:  Heartburn.  Difficult or painful swallowing.  The feeling of having a lump in the throat.  Abitter taste in the mouth.  Bad breath.  Having a large amount of saliva.  Having an upset or bloated stomach.  Belching.  Chest pain.  Shortness of breath or wheezing.  Ongoing (chronic) cough or a night-time cough.  Wearing away of tooth enamel.  Weight loss.  Different conditions can cause chest pain. Make sure to see your health care provider if you experience chest pain. How is this diagnosed? Your health care provider will take a medical history and perform a physical exam. To determine if you  have mild or severe GERD, your health care provider may also monitor how you respond to treatment. You may also have other tests, including:  An endoscopy toexamine your stomach and esophagus with a small camera.  A test thatmeasures the acidity level in your esophagus.  A test thatmeasures how much pressure is on your esophagus.  A barium swallow or modified barium swallow to show the shape, size, and functioning of your esophagus.  How is this treated? The goal of treatment is to help relieve your symptoms and to prevent complications. Treatment for this condition may vary depending on how severe your symptoms are. Your health care provider may recommend:  Changes to your diet.  Medicine.  Surgery.  Follow these instructions at home: Diet  Follow a diet as recommended by your health care provider. This may involve avoiding foods and drinks such as: ? Coffee and tea (with or without caffeine). ? Drinks that containalcohol. ? Energy drinks and sports drinks. ? Carbonated drinks or sodas. ? Chocolate and cocoa. ? Peppermint and mint flavorings. ? Garlic and onions. ? Horseradish. ? Spicy and acidic foods, including peppers, chili powder, curry powder, vinegar, hot sauces, and barbecue sauce. ? Citrus fruit juices and citrus fruits, such as oranges, lemons, and limes. ? Tomato-based foods, such as red sauce, chili, salsa, and pizza with red sauce. ? Fried and fatty foods, such as donuts, french fries, potato chips, and high-fat dressings. ? High-fat meats, such as hot dogs and fatty cuts of red and white meats, such as rib eye steak, sausage, ham, and bacon. ? High-fat dairy  items, such as whole milk, butter, and cream cheese.  Eat small, frequent meals instead of large meals.  Avoid drinking large amounts of liquid with your meals.  Avoid eating meals during the 2-3 hours before bedtime.  Avoid lying down right after you eat.  Do not exercise right after you  eat. General instructions  Pay attention to any changes in your symptoms.  Take over-the-counter and prescription medicines only as told by your health care provider. Do not take aspirin, ibuprofen, or other NSAIDs unless your health care provider told you to do so.  Do not use any tobacco products, including cigarettes, chewing tobacco, and e-cigarettes. If you need help quitting, ask your health care provider.  Wear loose-fitting clothing. Do not wear anything tight around your waist that causes pressure on your abdomen.  Raise (elevate) the head of your bed 6 inches (15cm).  Try to reduce your stress, such as with yoga or meditation. If you need help reducing stress, ask your health care provider.  If you are overweight, reduce your weight to an amount that is healthy for you. Ask your health care provider for guidance about a safe weight loss goal.  Keep all follow-up visits as told by your health care provider. This is important. Contact a health care provider if:  You have new symptoms.  You have unexplained weight loss.  You have difficulty swallowing, or it hurts to swallow.  You have wheezing or a persistent cough.  Your symptoms do not improve with treatment.  You have a hoarse voice. Get help right away if:  You have pain in your arms, neck, jaw, teeth, or back.  You feel sweaty, dizzy, or light-headed.  You have chest pain or shortness of breath.  You vomit and your vomit looks like blood or coffee grounds.  You faint.  Your stool is bloody or black.  You cannot swallow, drink, or eat. This information is not intended to replace advice given to you by your health care provider. Make sure you discuss any questions you have with your health care provider. Document Released: 12/13/2004 Document Revised: 08/03/2015 Document Reviewed: 06/30/2014 Elsevier Interactive Patient Education  2018 [employer].  Gastroesophageal Reflux Disease, Adult Normally, food  travels down the esophagus and stays in the stomach to be digested. However, when a person has gastroesophageal reflux disease (GERD), food and stomach acid move back up into the esophagus. When this happens, the esophagus becomes sore and inflamed. Over time, GERD can create small holes (ulcers) in the lining of the esophagus. What are the causes? This condition is caused by a problem with the muscle between the esophagus and the stomach (lower esophageal sphincter, or LES). Normally, the LES muscle closes after food passes through the esophagus to the stomach. When the LES is weakened or abnormal, it does not close properly, and that allows food and stomach acid to go back up into the esophagus. The LES can be weakened by certain dietary substances, medicines, and medical conditions, including:  Tobacco use.  Pregnancy.  Having a hiatal hernia.  Heavy alcohol use.  Certain foods and beverages, such as coffee, chocolate, onions, and peppermint.  What increases the risk? This condition is more likely to develop in:  People who have an increased body weight.  People who have connective tissue disorders.  People who use NSAID medicines.  What are the signs or symptoms? Symptoms of this condition include:  Heartburn.  Difficult or painful swallowing.  The feeling of having a  lump in the throat.  Abitter taste in the mouth.  Bad breath.  Having a large amount of saliva.  Having an upset or bloated stomach.  Belching.  Chest pain.  Shortness of breath or wheezing.  Ongoing (chronic) cough or a night-time cough.  Wearing away of tooth enamel.  Weight loss.  Different conditions can cause chest pain. Make sure to see your health care provider if you experience chest pain. How is this diagnosed? Your health care provider will take a medical history and perform a physical exam. To determine if you have mild or severe GERD, your health care provider may also monitor how  you respond to treatment. You may also have other tests, including:  An endoscopy toexamine your stomach and esophagus with a small camera.  A test thatmeasures the acidity level in your esophagus.  A test thatmeasures how much pressure is on your esophagus.  A barium swallow or modified barium swallow to show the shape, size, and functioning of your esophagus.  How is this treated? The goal of treatment is to help relieve your symptoms and to prevent complications. Treatment for this condition may vary depending on how severe your symptoms are. Your health care provider may recommend:  Changes to your diet.  Medicine.  Surgery.  Follow these instructions at home: Diet  Follow a diet as recommended by your health care provider. This may involve avoiding foods and drinks such as: ? Coffee and tea (with or without caffeine). ? Drinks that containalcohol. ? Energy drinks and sports drinks. ? Carbonated drinks or sodas. ? Chocolate and cocoa. ? Peppermint and mint flavorings. ? Garlic and onions. ? Horseradish. ? Spicy and acidic foods, including peppers, chili powder, curry powder, vinegar, hot sauces, and barbecue sauce. ? Citrus fruit juices and citrus fruits, such as oranges, lemons, and limes. ? Tomato-based foods, such as red sauce, chili, salsa, and pizza with red sauce. ? Fried and fatty foods, such as donuts, french fries, potato chips, and high-fat dressings. ? High-fat meats, such as hot dogs and fatty cuts of red and white meats, such as rib eye steak, sausage, ham, and bacon. ? High-fat dairy items, such as whole milk, butter, and cream cheese.  Eat small, frequent meals instead of large meals.  Avoid drinking large amounts of liquid with your meals.  Avoid eating meals during the 2-3 hours before bedtime.  Avoid lying down right after you eat.  Do not exercise right after you eat. General instructions  Pay attention to any changes in your  symptoms.  Take over-the-counter and prescription medicines only as told by your health care provider. Do not take aspirin, ibuprofen, or other NSAIDs unless your health care provider told you to do so.  Do not use any tobacco products, including cigarettes, chewing tobacco, and e-cigarettes. If you need help quitting, ask your health care provider.  Wear loose-fitting clothing. Do not wear anything tight around your waist that causes pressure on your abdomen.  Raise (elevate) the head of your bed 6 inches (15cm).  Try to reduce your stress, such as with yoga or meditation. If you need help reducing stress, ask your health care provider.  If you are overweight, reduce your weight to an amount that is healthy for you. Ask your health care provider for guidance about a safe weight loss goal.  Keep all follow-up visits as told by your health care provider. This is important. Contact a health care provider if:  You have new symptoms.  You have unexplained weight loss.  You have difficulty swallowing, or it hurts to swallow.  You have wheezing or a persistent cough.  Your symptoms do not improve with treatment.  You have a hoarse voice. Get help right away if:  You have pain in your arms, neck, jaw, teeth, or back.  You feel sweaty, dizzy, or light-headed.  You have chest pain or shortness of breath.  You vomit and your vomit looks like blood or coffee grounds.  You faint.  Your stool is bloody or black.  You cannot swallow, drink, or eat. This information is not intended to replace advice given to you by your health care provider. Make sure you discuss any questions you have with your health care provider. Document Released: 12/13/2004 Document Revised: 08/03/2015 Document Reviewed: 06/30/2014 Elsevier Interactive Patient Education  [employer].

## 2018-01-29 NOTE — Progress Notes (Signed)
Subjective:    Patient ID: KREIG PARSON, male    DOB: Sep 30, 1945, 72 y.o.   MRN: [id number]  HPI Here today with c/o gastritis. Last seen in October of 2018. Troponin's were normal.  Hx of chronic GERD.  Seen in the ED 01/24/2018 with chest pain and belching. EKG showed NSR. The pain lasted for about 24 hrs. Finally resolved. He does feel better. Slight tenderness in epigastric region. His appetite is good. No weight.    Hx of atrial fib and maintained on Eliquis.  07/20/2015 EGD: epigastric pain.  Impression: - Normal esophagus. - Esophagogastric landmarks identified. - Non-erosive gastritis. Biopsied. - Normal duodenal bulb and second portion of the  duodenum.   Underwent a colonoscopy in March of this year for personal hx of colon polyps:  Impression: - One 4 to 6 mm polyp at the hepatic flexure,  removed with a cold snare. Resected and retrieved. - One 6 to 8 mm polyp in the proximal transverse  colon, removed with a cold snare. Resected and  retrieved. Clips (MR conditional) were placed. - One 5 to 8 mm polyp in the mid sigmoid colon,  removed with a hot snare. Resected and retrieved.  Clip (MR conditional) was placed. Tattooed. - Diverticulosis in the sigmoid colon. - External hemorrhoids. - Single smali papilla.  Polyps are sessile serrated and tubular adenomas. Next colonoscopy in 5 years.   Review of Systems Past Medical History:  Diagnosis Date  . Anxiety   . Atrial fibrillation ([place])   . 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 Chest pain    myoview 07/09/12-normal, nl ef; echo  04/12/05-ef>55%, mild aortic sclerosis  . GERD (gastroesophageal reflux disease)   . H. pylori infection   . Headache   . Hypercholesteremia   . Hyperlipidemia   . Hypertension   . Hyponatremia 2013  . Palpitations    event monior 04/11/05- SR with occ PAC  . Personal history of colonic polyps 2 9 2007    Past Surgical History:  Procedure Laterality Date  . BACK SURGERY    . CATARACT EXTRACTION W/PHACO Left 12/08/2012   Procedure: CATARACT EXTRACTION PHACO AND INTRAOCULAR LENS PLACEMENT (IOC);  Surgeon: Tonny Branch, MD;  Location: [facility];  Service: Ophthalmology;  Laterality: Left;  CDE:  23.19  . CATARACT EXTRACTION W/PHACO Right 04/15/2014   Procedure: CATARACT EXTRACTION PHACO AND INTRAOCULAR LENS PLACEMENT RIGHT;  Surgeon: Tonny Branch, MD;  Location: [facility];  Service: Ophthalmology;  Laterality: Right;  CDE:10.32  . COLONOSCOPY  12/06/2010   Procedure: COLONOSCOPY;  Surgeon: Rogene Houston, MD;  Location: [facility];  Service: Endoscopy;  Laterality: N/A;  . COLONOSCOPY N/A 05/23/2016   Procedure: COLONOSCOPY;  Surgeon: Rogene Houston, MD;  Location: [facility];  Service: Endoscopy;  Laterality: N/A;  930  . ESOPHAGOGASTRODUODENOSCOPY N/A 07/20/2015   Procedure: ESOPHAGOGASTRODUODENOSCOPY (EGD);  Surgeon: Rogene Houston, MD;  [facility];  Service: Endoscopy;  Laterality: N/A;  2:55 - moved to 1:55 - Ann notified pt  . EYE SURGERY    . LUMBAR DISC SURGERY Left 2007   "L4-5 ruptured discs"  . POLYPECTOMY  05/23/2016   Procedure: POLYPECTOMY;  Surgeon: Rogene Houston, MD;  [facility]: [facility];  Service: Endoscopy;;  colon    Allergies  Allergen Reactions  . Lisinopril Swelling    H/o of angioedema   . Chlorthalidone Other (See Comments)    Causes severe hyponatremia  . Flexeril [Cyclobenzaprine]   .  Hyoscyamine Swelling  . Prevacid [Lansoprazole] Swelling  . Valium [Diazepam] Other (See Comments)    "makes me feel crazy"    Current Outpatient Medications  on File Prior to Visit  Medication Sig Dispense Refill  . apixaban (ELIQUIS) 5 MG TABS tablet Take 1 tablet (5 mg total) by mouth 2 (two) times daily. 60 tablet 2  . atorvastatin (LIPITOR) 10 MG tablet Take 10 mg by mouth at bedtime.     Marland Kitchen diltiazem (CARDIZEM CD) 180 MG 24 hr capsule Take 1 capsule (180 mg total) by mouth daily. 30 capsule 12  . metoprolol tartrate (LOPRESSOR) 25 MG tablet Take 1 tablet (25 mg total) by mouth 2 (two) times daily. (Patient taking differently: Take 12.5 mg by mouth 2 (two) times daily. ) 180 tablet 3  . pantoprazole (PROTONIX) 40 MG tablet Take 40 mg by mouth daily.      No current facility-administered medications on file prior to visit.         Objective:   Physical Exam Blood pressure (!) 164/70, pulse 65, temperature 97.8 F (36.6 C), height 5\' 8"  (1.727 m), weight 146 lb 4.8 oz (66.4 kg). Alert and oriented. Skin warm and dry. Oral mucosa is moist.   . Sclera anicteric, conjunctivae is pink. Thyroid not enlarged. No cervical lymphadenopathy. Lungs clear. Heart regular rate and rhythm.  Abdomen is soft. Bowel sounds are positive. No hepatomegaly. No abdominal masses felt. No tenderness.  No edema to lower extremities.          Assessment & Plan:  GERD. Samples of Dexilant given to her patient.  Call with PR in 2 weeks.

## 2018-02-04 ENCOUNTER — Encounter (INDEPENDENT_AMBULATORY_CARE_PROVIDER_SITE_OTHER): Payer: Self-pay | Admitting: Internal Medicine

## 2018-02-04 ENCOUNTER — Ambulatory Visit (INDEPENDENT_AMBULATORY_CARE_PROVIDER_SITE_OTHER): Payer: Medicare Other | Admitting: Internal Medicine

## 2018-02-04 VITALS — BP 150/66 | HR 68 | Temp 98.2°F | Resp 18 | Ht 68.0 in | Wt 147.8 lb

## 2018-02-04 DIAGNOSIS — R0789 Other chest pain: Secondary | ICD-10-CM

## 2018-02-04 DIAGNOSIS — R1013 Epigastric pain: Secondary | ICD-10-CM | POA: Diagnosis not present

## 2018-02-04 MED ORDER — FAMOTIDINE 20 MG PO TABS: 20.0000 mg | ORAL_TABLET | Freq: Every day | ORAL | Status: DC

## 2018-02-04 NOTE — Patient Instructions (Addendum)
Take pantoprazole 30 minutes before breakfast daily.   Call office with progress report in 2 weeks or earlier if pain becomes more pronounced.

## 2018-02-04 NOTE — Progress Notes (Signed)
Presenting complaint;  Chest and epigastric pain.  Database and subjective:  Patient is 72 year old Caucasian male who has a history of intermittent epigastric pain for the last few years.  He had a EGD with gastric biopsy in May 2017.  Esophagus was normal.  He had gastritis but no evidence of peptic ulcer disease.  Gastric biopsy revealed reactive changes but no evidence of H. pylori gastritis.  He was doing well and he stopped pantoprazole several months ago possibly towards the end of 2017.  Patient states he was fine until 4 weeks ago when he began to experience retrosternal and upper abdominal pain.  He says this pain occurred simultaneously and at times he would have epigastric pain but no chest pain and vice versa.  He describes this pain as soreness and it was more or less constant continuing for days.  He did not experience nausea vomiting shortness of breath diaphoreses or lightheadedness. He saw Dr. Woody Seller on 01/18/2018.  Dr. Helyn Numbers asked him to go back on pantoprazole twice daily and Pepcid at bedtime and also given prescription for Carafate.  He stopped Carafate.  He thought it made him sick.  He did not feel better with pantoprazole and Pepcid.  He got concerned that he may be having heart issues.  He therefore came to emergency room at [facility] on 01/24/2018.  Evaluation in the emergency room revealed him to be in sinus rhythm.  He has history of atrial fibrillation.  Troponin levels were normal.  EKG did not show any acute changes.  He was advised to continue pantoprazole. He continued to have a steady pain.  He therefore came to our office on 01/29/2018 and was seen by Ms. Setzer NP who recommended taking dexlansoprazole instead and arrange for follow-up in 2 weeks. He decided to continue pantoprazole.  Since his pain persisted he decided to come back for follow-up ahead of schedule. He states today has been his best day.  He has very mild pain in epigastric region.  He reports temporary relief of  his pain with meals.  He has good appetite and his weight has been stable.  He denies heartburn dysphagia melena or rectal bleeding.  He is still working.  He generally works 35 hours a week.  He does not drink alcohol or smoke cigarettes. He had ultrasound in February 2014 and study was negative for gallstones.  Current Medications: Outpatient Encounter Medications as of 02/04/2018  Medication Sig  . apixaban (ELIQUIS) 5 MG TABS tablet Take 1 tablet (5 mg total) by mouth 2 (two) times daily.  Marland Kitchen atorvastatin (LIPITOR) 10 MG tablet Take 10 mg by mouth at bedtime.   Marland Kitchen diltiazem (CARDIZEM CD) 180 MG 24 hr capsule Take 1 capsule (180 mg total) by mouth daily.  . metoprolol tartrate (LOPRESSOR) 25 MG tablet Take 1 tablet (25 mg total) by mouth 2 (two) times daily. (Patient taking differently: Take 12.5 mg by mouth 2 (two) times daily. )  . pantoprazole (PROTONIX) 40 MG tablet Take 40 mg by mouth daily.    No facility-administered encounter medications on file as of 02/04/2018.    Past Medical History:  Diagnosis Date  . Anxiety   . Atrial fibrillation ([place])   . 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 Chest pain    myoview 07/09/12-normal, nl ef; echo 04/12/05-ef>55%, mild aortic sclerosis  . GERD (gastroesophageal reflux disease)   . H. pylori infection   . Headache   . Hypercholesteremia   . Hyperlipidemia   .  Hypertension   . Hyponatremia 2013  . Palpitations    event monior 04/11/05- SR with occ PAC  . Personal history of colonic polyps 2 9 2007   Past Surgical History:  Procedure Laterality Date  . BACK SURGERY    . CATARACT EXTRACTION W/PHACO Left 12/08/2012   Procedure: CATARACT EXTRACTION PHACO AND INTRAOCULAR LENS PLACEMENT (IOC);  Surgeon: Tonny Branch, MD;  Location: [facility];  Service: Ophthalmology;  Laterality: Left;  CDE:  23.19  . CATARACT EXTRACTION W/PHACO Right 04/15/2014   Procedure: CATARACT EXTRACTION PHACO AND INTRAOCULAR LENS PLACEMENT RIGHT;  Surgeon: Tonny Branch, MD;   Location: [facility];  Service: Ophthalmology;  Laterality: Right;  CDE:10.32  . COLONOSCOPY  12/06/2010   Procedure: COLONOSCOPY;  Surgeon: Rogene Houston, MD;  [facility];  Service: Endoscopy;  Laterality: N/A;  . COLONOSCOPY N/A 05/23/2016   Procedure: COLONOSCOPY;  Surgeon: Rogene Houston, MD;  [facility];  Service: Endoscopy;  Laterality: N/A;  930  . ESOPHAGOGASTRODUODENOSCOPY N/A 07/20/2015   Procedure: ESOPHAGOGASTRODUODENOSCOPY (EGD);  Surgeon: Rogene Houston, MD;  [facility];  Service: Endoscopy;  Laterality: N/A;  2:55 - moved to 1:55 - Ann notified pt  . EYE SURGERY    . LUMBAR DISC SURGERY Left 2007   "L4-5 ruptured discs"  . POLYPECTOMY  05/23/2016   Procedure: POLYPECTOMY;  Surgeon: Rogene Houston, MD;  [facility];  Service: Endoscopy;;  colon     Objective: Blood pressure (!) 150/66, pulse 68, temperature 98.2 F (36.8 C), temperature source Oral, resp. rate 18, height 5\' 8"  (1.727 m), weight 147 lb 12.8 oz (67 kg). Patient is alert and in no acute distress. Patient is quite anxious. Conjunctiva is pink. Sclera is nonicteric Oropharyngeal mucosa is normal. No neck masses or thyromegaly noted. He does not have chest wall tenderness. Cardiac exam with regular rhythm normal S1 and S2. No murmur or gallop noted. Lungs are clear to auscultation. Abdomen is symmetrical.  Bowel sounds are normal.  No bruit noted.  On palpation abdomen is soft.  He has mild tenderness in mid epigastric region on deep palpation.  No organomegaly or masses. No LE edema or clubbing noted.  Labs/studies Results:  Lab data from 01/24/2018 WBC 8.3, H&H 13.2 and 41.0 and platelet count 300 K. Serum calcium 9.3. Bilirubin 0.3, AP 109, AST 18, ALT 21, total protein 7.4 and albumin 4.6.  Assessment:  #1.  Epigastric pain.  The present episode started over 3 weeks ago.  He has a history of epigastric pain which he has had off and on for years and he has  responded to acid suppression.  EGD in May 2017 ruled out H. pylori infection or peptic ulcer disease.  He possibly has a recurrent dyspepsia.  Peptic ulcer disease is less likely but will have to be ruled out if symptoms persist.  Last ultrasound was in February 2014.  Ultrasound may have to be repeated depending on his clinical course.  #2.  Atypical chest pain.  I feel chest pain is part and parcel of his epigastric pain.  Recent ER visit revealed no EKG abnormality to suggest cardiac etiology and his troponin level was normal.  He has undergone noninvasive cardiac evaluation in the past and work-up was negative.   Plan:  Patient reassured. Patient advised to take pantoprazole 40 mg 30 minutes before breakfast daily. He will also take famotidine 20 mg p.o. Nightly. Patient will call with progress report in 2  weeks or earlier if pain becomes worse if he has tarry stools. If he is not feeling better we will proceed with upper abdominal ultrasound prior to considering EGD. Office visit in 3 months.

## 2018-02-18 ENCOUNTER — Encounter: Payer: Self-pay | Admitting: Cardiology

## 2018-02-18 ENCOUNTER — Telehealth: Payer: Self-pay | Admitting: Cardiovascular Disease

## 2018-02-18 DIAGNOSIS — Z7901 Long term (current) use of anticoagulants: Secondary | ICD-10-CM | POA: Insufficient documentation

## 2018-02-18 NOTE — Telephone Encounter (Signed)
Spoke with pt, he was recently seen in [facility] for chest pain. He reports he has acid reflux bad and is being treated for it and the pepcid has helped some. He reports tightness and fatigue with walking. He reports he has never really had problems with chest pain, he has atrial fib. He reports he is currently not in a fib. He has an appointment tomorrow and was told to not do exercise walking until after he is seen. Patient voiced understanding and will come to the appt tomorrow.

## 2018-02-19 ENCOUNTER — Encounter: Payer: Self-pay | Admitting: Cardiology

## 2018-02-19 ENCOUNTER — Ambulatory Visit: Payer: Medicare Other | Admitting: Cardiology

## 2018-02-19 DIAGNOSIS — Z8249 Family history of ischemic heart disease and other diseases of the circulatory system: Secondary | ICD-10-CM

## 2018-02-19 DIAGNOSIS — K219 Gastro-esophageal reflux disease without esophagitis: Secondary | ICD-10-CM | POA: Diagnosis not present

## 2018-02-19 DIAGNOSIS — I200 Unstable angina: Secondary | ICD-10-CM

## 2018-02-19 DIAGNOSIS — I480 Paroxysmal atrial fibrillation: Secondary | ICD-10-CM

## 2018-02-19 LAB — BASIC METABOLIC PANEL
BUN/Creatinine Ratio: 8 — ABNORMAL LOW (ref 10–24)
BUN: 7 mg/dL — ABNORMAL LOW (ref 8–27)
CO2: 24 mmol/L (ref 20–29)
Calcium: 9.6 mg/dL (ref 8.6–10.2)
Chloride: 99 mmol/L (ref 96–106)
Creatinine, Ser: 0.93 mg/dL (ref 0.76–1.27)
GFR calc Af Amer: 95 mL/min/{1.73_m2} (ref >59)
GFR calc non Af Amer: 82 mL/min/{1.73_m2} (ref >59)
Glucose: 113 mg/dL — ABNORMAL HIGH (ref 65–99)
Potassium: 5 mmol/L (ref 3.5–5.2)
Sodium: 136 mmol/L (ref 134–144)

## 2018-02-19 LAB — CBC WITH DIFFERENTIAL/PLATELET
Basophils Absolute: 0.1 10*3/uL (ref 0.0–0.2)
Basos: 1 %
EOS (ABSOLUTE): 0.1 10*3/uL (ref 0.0–0.4)
Eos: 2 %
Hematocrit: 39.9 % (ref 37.5–51.0)
Hemoglobin: 13 g/dL (ref 13.0–17.7)
Immature Grans (Abs): 0 10*3/uL (ref 0.0–0.1)
Immature Granulocytes: 0 %
Lymphocytes Absolute: 1.1 10*3/uL (ref 0.7–3.1)
Lymphs: 15 %
MCH: 28.9 pg (ref 26.6–33.0)
MCHC: 32.6 g/dL (ref 31.5–35.7)
MCV: 89 fL (ref 79–97)
Monocytes Absolute: 0.8 10*3/uL (ref 0.1–0.9)
Monocytes: 11 %
Neutrophils Absolute: 5.3 10*3/uL (ref 1.4–7.0)
Neutrophils: 71 %
Platelets: 311 10*3/uL (ref 150–450)
RBC: 4.5 x10E6/uL (ref 4.14–5.80)
RDW: 12.6 % (ref 12.3–15.4)
WBC: 7.4 10*3/uL (ref 3.4–10.8)

## 2018-02-19 MED ORDER — ASPIRIN EC 81 MG PO TBEC: 81.0000 mg | DELAYED_RELEASE_TABLET | Freq: Every day | ORAL | Status: DC

## 2018-02-19 MED ORDER — ISOSORBIDE MONONITRATE ER 30 MG PO TB24: 30.0000 mg | ORAL_TABLET | Freq: Every day | ORAL | 1 refills | Status: DC

## 2018-02-19 NOTE — Assessment & Plan Note (Signed)
Followed by Dr Hassan Buckler PPI

## 2018-02-19 NOTE — Assessment & Plan Note (Signed)
Moderate LVH, EF 60-65%, and grade 1 DD by echo Dec 2017

## 2018-02-19 NOTE — Progress Notes (Signed)
02/19/2018 [name]   1945-11-24  [id number]  Primary Physician Glenda Chroman, MD Primary Cardiologist: Dr [name]  HPI: [name] is a pleasant 72 year old Caucasian male followed by Dr. Alvester Chou with a history of PAF.  He had a low risk Myoview in 2014 for chest pain.  He is followed by Dr. Melony Overly in [place] for gastroesophageal reflux.  He is on a PPI.  He was seen in the emergency room 01/24/2018 and [place] with chest pain.  His troponins were negative and his EKG was normal.  He seen in the office today for further evaluation.  The patient describes exertional upper chest discomfort for the past month or so.  He gets it when he was walking his dog.  It is relieved with rest.  The other day he became mildly diaphoretic with it.  It does not radiate to his jaw or arms or back.  At rest he has no chest pain.  The patient thought this was reflux but he is being treated for this with a PPI.  He does have a family history of coronary disease, his father had bypass grafting in his 24s.   Current Outpatient Medications  Medication Sig Dispense Refill  . apixaban (ELIQUIS) 5 MG TABS tablet Take 1 tablet (5 mg total) by mouth 2 (two) times daily. 60 tablet 2  . atorvastatin (LIPITOR) 10 MG tablet Take 10 mg by mouth at bedtime.     Marland Kitchen diltiazem (CARDIZEM CD) 180 MG 24 hr capsule Take 1 capsule (180 mg total) by mouth daily. 30 capsule 12  . famotidine (PEPCID) 20 MG tablet Take 1 tablet (20 mg total) by mouth at bedtime.    . metoprolol tartrate (LOPRESSOR) 25 MG tablet Take 1 tablet (25 mg total) by mouth 2 (two) times daily. (Patient taking differently: Take 12.5 mg by mouth 2 (two) times daily. ) 180 tablet 3  . pantoprazole (PROTONIX) 40 MG tablet Take 40 mg by mouth daily.     . isosorbide mononitrate (IMDUR) 30 MG 24 hr tablet Take 1 tablet (30 mg total) by mouth daily. 30 tablet 1   No current facility-administered medications for this visit.     Allergies  Allergen Reactions  .  Lisinopril Swelling    H/o of angioedema   . Chlorthalidone Other (See Comments)    Causes severe hyponatremia  . Flexeril [Cyclobenzaprine]   . Hyoscyamine Swelling  . Prevacid [Lansoprazole] Swelling  . Valium [Diazepam] Other (See Comments)    "makes me feel crazy"    Past Medical History:  Diagnosis Date  . Anxiety   . Atrial fibrillation ([place])   . 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 Chest pain    myoview 07/09/12-normal, nl ef; echo 04/12/05-ef>55%, mild aortic sclerosis  . GERD (gastroesophageal reflux disease)   . H. pylori infection   . Headache   . Hypercholesteremia   . Hyperlipidemia   . Hypertension   . Hyponatremia 2013  . Palpitations    event monior 04/11/05- SR with occ PAC  . Personal history of colonic polyps 2 9 2007    Social History   Socioeconomic History  . Marital status: Married    Spouse name: Not on file  . Number of children: Not on file  . Years of education: Not on file  . Highest education level: Not on file  Occupational History  . Not on file  Social Needs  . Financial resource strain: Not on file  .  Food insecurity:    Worry: Not on file    Inability: Not on file  . Transportation needs:    Medical: Not on file    Non-medical: Not on file  Tobacco Use  . Smoking status: Never Smoker  . Smokeless tobacco: Never Used  Substance and Sexual Activity  . Alcohol use: No  . Drug use: No  . Sexual activity: Not on file  Lifestyle  . Physical activity:    Days per week: Not on file    Minutes per session: Not on file  . Stress: Not on file  Relationships  . Social connections:    Talks on phone: Not on file    Gets together: Not on file    Attends religious service: Not on file    Active member of club or organization: Not on file    Attends meetings of clubs or organizations: Not on file    Relationship status: Not on file  . Intimate partner violence:    Fear of current or ex partner: Not on file    Emotionally abused: Not on  file    Physically abused: Not on file    Forced sexual activity: Not on file  Other Topics Concern  . Not on file  Social History Narrative   ** Merged History Encounter **         Family History  Problem Relation Age of Onset  . Hypertension Father   . Hyperlipidemia Father   . CAD Father        H/O CABG     Review of Systems: General: negative for chills, fever, night sweats or weight changes.  Cardiovascular: negative for dyspnea on exertion, edema, orthopnea, palpitations, paroxysmal nocturnal dyspnea or shortness of breath Dermatological: negative for rash Respiratory: negative for cough or wheezing Urologic: negative for hematuria Abdominal: negative for nausea, vomiting, diarrhea, bright red blood per rectum, melena, or hematemesis Neurologic: negative for visual changes, syncope, or dizziness All other systems reviewed and are otherwise negative except as noted above.    Blood pressure (!) 148/78, pulse 66, height 5\' 8"  (1.727 m), weight 146 lb (66.2 kg).  General appearance: alert, cooperative and no distress Neck: no carotid bruit and no JVD Lungs: clear to auscultation bilaterally Heart: regular rate and rhythm Abdomen: soft, non-tender; bowel sounds normal; no masses,  no organomegaly Extremities: extremities normal, atraumatic, no cyanosis or edema Pulses: 2+ and symmetric Skin: Skin color, texture, turgor normal. No rashes or lesions Neurologic: Grossly normal  EKG NSR, PVC  ASSESSMENT AND PLAN:   Unstable angina ([place]) Negative Myoview 2014 Recurrent chest -ED visit 11/08 Now seen in the office with a history of new onset exertional angina  PAF (paroxysmal atrial fibrillation) ([place]) Pt admitted Dec 2017 with PAF- Eliquis added, he converted with rate control.  Dyslipidemia On low dose Lipitor  Essential hypertension Moderate LVH, EF 60-65%, and grade 1 DD by echo Dec 2017  Chronic anticoagulation CHADS VASC=2 on Eliquis  GERD  (gastroesophageal reflux disease) Followed by Dr Hassan Buckler PPI  Family history of coronary artery disease in father CABG in his 22s   PLAN  Pt seen in the offoce by Dr. Ellyn Hack and myself.  We feel the best course would be to proceed with diagnostic catheterization.  We will add low-dose Imdur, ASA 81 mg, and sublingual nitroglycerin and asked him to take it easy over the weekend.  He will be set up for diagnostic catheterization on Monday.  His last dose of Eliquis  will be Saturday night. The patient understands that risks included but are not limited to stroke (1 in 1000), death (1 in 68), kidney failure [usually temporary] (1 in 500), bleeding (1 in 200), allergic reaction [possibly serious] (1 in 200).  The patient understands and agrees to proceed.   Kerin Ransom PA-C 02/19/2018 8:20 AM

## 2018-02-19 NOTE — Assessment & Plan Note (Signed)
CHADS VASC=2 on Eliquis

## 2018-02-19 NOTE — Patient Instructions (Addendum)
Medication Instructions:  Your physician has recommended you make the following change in your medication:  1) START Imdur 30mg  daily. An Rx has been sent to your pharmacy 2) START Aspirin 81mg  daily 3) Take your last dose of Eliquis on Saturday 02/22/18 in anticipation of your procedure on 02/24/18  If you need a refill on your cardiac medications before your next appointment, please call your pharmacy.   Lab work: Risk manager today If you have labs (blood work) drawn today and your tests are completely normal, you will receive your results only by: Marland Kitchen MyChart Message (if you have MyChart) OR . A paper copy in the mail If you have any lab test that is abnormal or we need to change your treatment, we will call you to review the results.  Testing/Procedures: Your physician has requested that you have a cardiac catheterization. Cardiac catheterization is used to diagnose and/or treat various heart conditions. Doctors may recommend this procedure for a number of different reasons. The most common reason is to evaluate chest pain. Chest pain can be a symptom of coronary artery disease (CAD), and cardiac catheterization can show whether plaque is narrowing or blocking your heart's arteries. This procedure is also used to evaluate the valves, as well as measure the blood flow and oxygen levels in different parts of your heart. For further information please visit [email or web address]. Please follow instruction sheet, as given.    Follow-Up: At [facility], you and your health needs are our priority.  As part of our continuing mission to provide you with exceptional heart care, we have created designated Provider Care Teams.  These Care Teams include your primary Cardiologist (physician) and Advanced Practice Providers (APPs -  Physician Assistants and Nurse Practitioners) who all work together to provide you with the care you need, when you need it. Your physician recommends that you schedule a  follow-up appointment after your procedure   Any Other Special Instructions Will Be Listed Below (If Applicable).    [address] [place] [address] Dept: [phone] Loc: [place]  02/19/2018  You are scheduled for a Cardiac Catheterization on Monday, December 9 with Dr. Quay Burow.  1. Please arrive at the [facility] (Main Entrance A) at [facility]: [address] [place], [place] [address] at 11:30 AM (This time is two hours before your procedure to ensure your preparation). Free valet parking service is available.   Special note: Every effort is made to have your procedure done on time. Please understand that emergencies sometimes delay scheduled procedures.  2. Diet: Do not eat solid foods after midnight.  The patient may have clear liquids until 5am upon the day of the procedure.  3. Labs: You will need to have blood drawn today  4. Medication instructions in preparation for your procedure:   Contrast Allergy: No    Current Outpatient Medications (Cardiovascular):  .  atorvastatin (LIPITOR) 10 MG tablet, Take 10 mg by mouth at bedtime.  Marland Kitchen  diltiazem (CARDIZEM CD) 180 MG 24 hr capsule, Take 1 capsule (180 mg total) by mouth daily. .  metoprolol tartrate (LOPRESSOR) 25 MG tablet, Take 1 tablet (25 mg total) by mouth 2 (two) times daily. (Patient taking differently: Take 12.5 mg by mouth 2 (two) times daily. ) .  isosorbide mononitrate (IMDUR) 30 MG 24 hr tablet, Take 1 tablet (30 mg total) by mouth daily.  Current Outpatient Medications (Hematological):  .  apixaban (ELIQUIS) 5 MG TABS tablet, Take 1 tablet (5 mg total) by mouth 2 (two) times daily.  Current Outpatient Medications (Other):  .  famotidine (PEPCID) 20 MG tablet, Take 1 tablet (20 mg total) by mouth at bedtime. .  pantoprazole (PROTONIX) 40 MG tablet, Take 40 mg by  mouth daily.  *For reference purposes while preparing patient instructions.   Delete this med list prior to printing instructions for patient.*  Stop taking Eliquis (Apixiban) on Saturday, December 7.     On the morning of your procedure, take your Aspirin and any morning medicines NOT listed above.  You may use sips of water.  5. Plan for one night stay--bring personal belongings. 6. Bring a current list of your medications and current insurance cards. 7. You MUST have a responsible person to drive you home. 8. Someone MUST be with you the first 24 hours after you arrive home or your discharge will be delayed. 9. Please wear clothes that are easy to get on and off and wear slip-on shoes.  Thank you for allowing [name] to care for you!   -- [facility]

## 2018-02-19 NOTE — Assessment & Plan Note (Signed)
Negative Myoview 2014 Recurrent chest -ED visit 11/08 Now seen in the office with a history of new onset exertional angina

## 2018-02-19 NOTE — H&P (View-Only) (Signed)
02/19/2018 [name]   May 21, 1945  [id number]  Primary Physician Glenda Chroman, MD Primary Cardiologist: Dr [name]  HPI: [name] is a pleasant 72 year old Caucasian male followed by Dr. Alvester Chou with a history of PAF.  He had a low risk Myoview in 2014 for chest pain.  He is followed by Dr. Melony Overly in [place] for gastroesophageal reflux.  He is on a PPI.  He was seen in the emergency room 01/24/2018 and [place] with chest pain.  His troponins were negative and his EKG was normal.  He seen in the office today for further evaluation.  The patient describes exertional upper chest discomfort for the past month or so.  He gets it when he was walking his dog.  It is relieved with rest.  The other day he became mildly diaphoretic with it.  It does not radiate to his jaw or arms or back.  At rest he has no chest pain.  The patient thought this was reflux but he is being treated for this with a PPI.  He does have a family history of coronary disease, his father had bypass grafting in his 72s.   Current Outpatient Medications  Medication Sig Dispense Refill  . apixaban (ELIQUIS) 5 MG TABS tablet Take 1 tablet (5 mg total) by mouth 2 (two) times daily. 60 tablet 2  . atorvastatin (LIPITOR) 10 MG tablet Take 10 mg by mouth at bedtime.     Marland Kitchen diltiazem (CARDIZEM CD) 180 MG 24 hr capsule Take 1 capsule (180 mg total) by mouth daily. 30 capsule 12  . famotidine (PEPCID) 20 MG tablet Take 1 tablet (20 mg total) by mouth at bedtime.    . metoprolol tartrate (LOPRESSOR) 25 MG tablet Take 1 tablet (25 mg total) by mouth 2 (two) times daily. (Patient taking differently: Take 12.5 mg by mouth 2 (two) times daily. ) 180 tablet 3  . pantoprazole (PROTONIX) 40 MG tablet Take 40 mg by mouth daily.     . isosorbide mononitrate (IMDUR) 30 MG 24 hr tablet Take 1 tablet (30 mg total) by mouth daily. 30 tablet 1   No current facility-administered medications for this visit.     Allergies  Allergen Reactions  .  Lisinopril Swelling    H/o of angioedema   . Chlorthalidone Other (See Comments)    Causes severe hyponatremia  . Flexeril [Cyclobenzaprine]   . Hyoscyamine Swelling  . Prevacid [Lansoprazole] Swelling  . Valium [Diazepam] Other (See Comments)    "makes me feel crazy"    Past Medical History:  Diagnosis Date  . Anxiety   . Atrial fibrillation ([place])   . 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 Chest pain    myoview 07/09/12-normal, nl ef; echo 04/12/05-ef>55%, mild aortic sclerosis  . GERD (gastroesophageal reflux disease)   . H. pylori infection   . Headache   . Hypercholesteremia   . Hyperlipidemia   . Hypertension   . Hyponatremia 2013  . Palpitations    event monior 04/11/05- SR with occ PAC  . Personal history of colonic polyps 2 9 2007    Social History   Socioeconomic History  . Marital status: Married    Spouse name: Not on file  . Number of children: Not on file  . Years of education: Not on file  . Highest education level: Not on file  Occupational History  . Not on file  Social Needs  . Financial resource strain: Not on file  .  Food insecurity:    Worry: Not on file    Inability: Not on file  . Transportation needs:    Medical: Not on file    Non-medical: Not on file  Tobacco Use  . Smoking status: Never Smoker  . Smokeless tobacco: Never Used  Substance and Sexual Activity  . Alcohol use: No  . Drug use: No  . Sexual activity: Not on file  Lifestyle  . Physical activity:    Days per week: Not on file    Minutes per session: Not on file  . Stress: Not on file  Relationships  . Social connections:    Talks on phone: Not on file    Gets together: Not on file    Attends religious service: Not on file    Active member of club or organization: Not on file    Attends meetings of clubs or organizations: Not on file    Relationship status: Not on file  . Intimate partner violence:    Fear of current or ex partner: Not on file    Emotionally abused: Not on  file    Physically abused: Not on file    Forced sexual activity: Not on file  Other Topics Concern  . Not on file  Social History Narrative   ** Merged History Encounter **         Family History  Problem Relation Age of Onset  . Hypertension Father   . Hyperlipidemia Father   . CAD Father        H/O CABG     Review of Systems: General: negative for chills, fever, night sweats or weight changes.  Cardiovascular: negative for dyspnea on exertion, edema, orthopnea, palpitations, paroxysmal nocturnal dyspnea or shortness of breath Dermatological: negative for rash Respiratory: negative for cough or wheezing Urologic: negative for hematuria Abdominal: negative for nausea, vomiting, diarrhea, bright red blood per rectum, melena, or hematemesis Neurologic: negative for visual changes, syncope, or dizziness All other systems reviewed and are otherwise negative except as noted above.    Blood pressure (!) 148/78, pulse 66, height 5\' 8"  (1.727 m), weight 146 lb (66.2 kg).  General appearance: alert, cooperative and no distress Neck: no carotid bruit and no JVD Lungs: clear to auscultation bilaterally Heart: regular rate and rhythm Abdomen: soft, non-tender; bowel sounds normal; no masses,  no organomegaly Extremities: extremities normal, atraumatic, no cyanosis or edema Pulses: 2+ and symmetric Skin: Skin color, texture, turgor normal. No rashes or lesions Neurologic: Grossly normal  EKG NSR, PVC  ASSESSMENT AND PLAN:   Unstable angina ([place]) Negative Myoview 2014 Recurrent chest -ED visit 11/08 Now seen in the office with a history of new onset exertional angina  PAF (paroxysmal atrial fibrillation) ([place]) Pt admitted Dec 2017 with PAF- Eliquis added, he converted with rate control.  Dyslipidemia On low dose Lipitor  Essential hypertension Moderate LVH, EF 60-65%, and grade 1 DD by echo Dec 2017  Chronic anticoagulation CHADS VASC=2 on Eliquis  GERD  (gastroesophageal reflux disease) Followed by Dr Hassan Buckler PPI  Family history of coronary artery disease in father CABG in his 76s   PLAN  Pt seen in the offoce by Dr. Ellyn Hack and myself.  We feel the best course would be to proceed with diagnostic catheterization.  We will add low-dose Imdur, ASA 81 mg, and sublingual nitroglycerin and asked him to take it easy over the weekend.  He will be set up for diagnostic catheterization on Monday.  His last dose of Eliquis  will be Saturday night. The patient understands that risks included but are not limited to stroke (1 in 1000), death (1 in 31), kidney failure [usually temporary] (1 in 500), bleeding (1 in 200), allergic reaction [possibly serious] (1 in 200).  The patient understands and agrees to proceed.   Kerin Ransom PA-C 02/19/2018 8:20 AM

## 2018-02-19 NOTE — Assessment & Plan Note (Signed)
CABG in his 82s

## 2018-02-19 NOTE — Assessment & Plan Note (Signed)
Pt admitted Dec 2017 with PAF- Eliquis added, he converted with rate control.

## 2018-02-19 NOTE — Assessment & Plan Note (Signed)
On low dose Lipitor

## 2018-02-21 ENCOUNTER — Telehealth: Payer: Self-pay | Admitting: *Deleted

## 2018-02-21 NOTE — Telephone Encounter (Addendum)
Pt contacted pre-catheterization scheduled at [facility] for: Monday February 24, 2018 3:30 PM Verified arrival time and place: [facility] Main Entrance A at:1:30 PM  No solid food after midnight prior to cath, clear liquids until 5 AM day of procedure.  Hold: Eliquis-stop Saturday Feb 22, 2018 until post procedure.  Except hold medications AM meds can be  taken pre-cath with sip of water including: ASA 81 mg  Confirmed patient has responsible person to drive home post procedure and for 24 hours after you arrive home: yes

## 2018-02-24 ENCOUNTER — Encounter (HOSPITAL_COMMUNITY)
Admission: AD | Disposition: A | Discharge status: Home / Self Care | Payer: Self-pay | Source: Ambulatory Visit | Attending: Cardiovascular Disease

## 2018-02-24 ENCOUNTER — Other Ambulatory Visit: Payer: Self-pay

## 2018-02-24 ENCOUNTER — Inpatient Hospital Stay (HOSPITAL_COMMUNITY)
Admission: AD | Admit: 2018-02-24 | Discharge: 2018-02-27 | DRG: 246 | Disposition: A | Discharge status: Home / Self Care | Payer: Medicare Other | Source: Ambulatory Visit | Attending: Cardiovascular Disease | Admitting: Cardiovascular Disease

## 2018-02-24 ENCOUNTER — Telehealth (INDEPENDENT_AMBULATORY_CARE_PROVIDER_SITE_OTHER): Payer: Self-pay | Admitting: Internal Medicine

## 2018-02-24 DIAGNOSIS — Y838 Other surgical procedures as the cause of abnormal reaction of the patient, or of later complication, without mention of misadventure at the time of the procedure: Secondary | ICD-10-CM | POA: Diagnosis not present

## 2018-02-24 DIAGNOSIS — Z8249 Family history of ischemic heart disease and other diseases of the circulatory system: Secondary | ICD-10-CM | POA: Diagnosis not present

## 2018-02-24 DIAGNOSIS — I2542 Coronary artery dissection: Secondary | ICD-10-CM | POA: Diagnosis not present

## 2018-02-24 DIAGNOSIS — Z86010 Personal history of colonic polyps: Secondary | ICD-10-CM

## 2018-02-24 DIAGNOSIS — K219 Gastro-esophageal reflux disease without esophagitis: Secondary | ICD-10-CM | POA: Diagnosis present

## 2018-02-24 DIAGNOSIS — E785 Hyperlipidemia, unspecified: Secondary | ICD-10-CM | POA: Diagnosis present

## 2018-02-24 DIAGNOSIS — I340 Nonrheumatic mitral (valve) insufficiency: Secondary | ICD-10-CM | POA: Diagnosis not present

## 2018-02-24 DIAGNOSIS — I9789 Other postprocedural complications and disorders of the circulatory system, not elsewhere classified: Secondary | ICD-10-CM | POA: Diagnosis not present

## 2018-02-24 DIAGNOSIS — I493 Ventricular premature depolarization: Secondary | ICD-10-CM | POA: Diagnosis not present

## 2018-02-24 DIAGNOSIS — I480 Paroxysmal atrial fibrillation: Secondary | ICD-10-CM | POA: Diagnosis present

## 2018-02-24 DIAGNOSIS — I10 Essential (primary) hypertension: Secondary | ICD-10-CM | POA: Diagnosis not present

## 2018-02-24 DIAGNOSIS — Z888 Allergy status to other drugs, medicaments and biological substances status: Secondary | ICD-10-CM | POA: Diagnosis not present

## 2018-02-24 DIAGNOSIS — I214 Non-ST elevation (NSTEMI) myocardial infarction: Secondary | ICD-10-CM | POA: Diagnosis not present

## 2018-02-24 DIAGNOSIS — I25110 Atherosclerotic heart disease of native coronary artery with unstable angina pectoris: Principal | ICD-10-CM

## 2018-02-24 DIAGNOSIS — Z79899 Other long term (current) drug therapy: Secondary | ICD-10-CM

## 2018-02-24 DIAGNOSIS — Z7901 Long term (current) use of anticoagulants: Secondary | ICD-10-CM | POA: Diagnosis not present

## 2018-02-24 DIAGNOSIS — Z8619 Personal history of other infectious and parasitic diseases: Secondary | ICD-10-CM

## 2018-02-24 DIAGNOSIS — R0902 Hypoxemia: Secondary | ICD-10-CM | POA: Diagnosis not present

## 2018-02-24 DIAGNOSIS — I2510 Atherosclerotic heart disease of native coronary artery without angina pectoris: Secondary | ICD-10-CM | POA: Diagnosis present

## 2018-02-24 DIAGNOSIS — Z955 Presence of coronary angioplasty implant and graft: Secondary | ICD-10-CM

## 2018-02-24 HISTORY — PX: LEFT HEART CATH AND CORONARY ANGIOGRAPHY: CATH118249

## 2018-02-24 HISTORY — PX: CORONARY STENT INTERVENTION: CATH118234

## 2018-02-24 LAB — POCT ACTIVATED CLOTTING TIME
Activated Clotting Time: 362 seconds
Activated Clotting Time: 450 seconds

## 2018-02-24 LAB — POCT I-STAT 3, ART BLOOD GAS (G3+)
BICARBONATE: 24.5 mmol/L (ref 20.0–28.0)
O2 Saturation: 98 %
PO2 ART: 110 mmHg — AB (ref 83.0–108.0)
TCO2: 26 mmol/L (ref 22–32)
pCO2 arterial: 38.1 mmHg (ref 32.0–48.0)
pH, Arterial: 7.417 (ref 7.350–7.450)

## 2018-02-24 LAB — MRSA PCR SCREENING: MRSA by PCR: NEGATIVE

## 2018-02-24 LAB — TROPONIN I: Troponin I: 0.03 ng/mL (ref <0.03)

## 2018-02-24 SURGERY — LEFT HEART CATH AND CORONARY ANGIOGRAPHY
Anesthesia: LOCAL

## 2018-02-24 MED ORDER — VERAPAMIL HCL 2.5 MG/ML IV SOLN
INTRAVENOUS | Status: AC
  Filled 2018-02-24: qty 2

## 2018-02-24 MED ORDER — HEPARIN SODIUM (PORCINE) 1000 UNIT/ML IJ SOLN
INTRAMUSCULAR | Status: DC | PRN
  Administered 2018-02-24: 3500 [IU] via INTRAVENOUS

## 2018-02-24 MED ORDER — SODIUM CHLORIDE 0.9% FLUSH
3.0000 mL | Freq: Two times a day (BID) | INTRAVENOUS | Status: DC
  Administered 2018-02-24 – 2018-02-26 (×4): 3 mL via INTRAVENOUS

## 2018-02-24 MED ORDER — CLOPIDOGREL BISULFATE 300 MG PO TABS
ORAL_TABLET | ORAL | Status: AC
  Filled 2018-02-24: qty 2

## 2018-02-24 MED ORDER — BIVALIRUDIN TRIFLUOROACETATE 250 MG IV SOLR
INTRAVENOUS | Status: AC
  Filled 2018-02-24: qty 250

## 2018-02-24 MED ORDER — SODIUM CHLORIDE 0.9 % IV SOLN
1.7500 mg/kg/h | INTRAVENOUS | Status: AC
  Administered 2018-02-24 (×2): 1.75 mg/kg/h via INTRAVENOUS
  Filled 2018-02-24: qty 250

## 2018-02-24 MED ORDER — SODIUM CHLORIDE 0.9 % IV SOLN
INTRAVENOUS | Status: AC | PRN
  Administered 2018-02-24 (×2): 1.75 mg/kg/h via INTRAVENOUS

## 2018-02-24 MED ORDER — ASPIRIN 81 MG PO CHEW: 81.0000 mg | CHEWABLE_TABLET | ORAL | Status: DC

## 2018-02-24 MED ORDER — SODIUM CHLORIDE 0.9 % WEIGHT BASED INFUSION
3.0000 mL/kg/h | INTRAVENOUS | Status: DC
  Administered 2018-02-24: 3 mL/kg/h via INTRAVENOUS

## 2018-02-24 MED ORDER — NITROGLYCERIN 1 MG/10 ML FOR IR/CATH LAB
INTRA_ARTERIAL | Status: DC | PRN
  Administered 2018-02-24: 200 ug via INTRACORONARY

## 2018-02-24 MED ORDER — ASPIRIN EC 81 MG PO TBEC
81.0000 mg | DELAYED_RELEASE_TABLET | Freq: Every day | ORAL | Status: DC
  Administered 2018-02-25 – 2018-02-27 (×3): 81 mg via ORAL
  Filled 2018-02-24 (×4): qty 1

## 2018-02-24 MED ORDER — HYDRALAZINE HCL 20 MG/ML IJ SOLN: 5.0000 mg | INTRAMUSCULAR | Status: AC | PRN

## 2018-02-24 MED ORDER — SODIUM CHLORIDE 0.9 % IV SOLN
INTRAVENOUS | Status: AC
  Administered 2018-02-24 – 2018-02-25 (×2): via INTRAVENOUS

## 2018-02-24 MED ORDER — NITROGLYCERIN IN D5W 200-5 MCG/ML-% IV SOLN
INTRAVENOUS | Status: AC | PRN
  Administered 2018-02-24: 10 ug/min via INTRAVENOUS

## 2018-02-24 MED ORDER — NITROGLYCERIN IN D5W 200-5 MCG/ML-% IV SOLN
0.0000 ug/min | INTRAVENOUS | Status: DC
  Administered 2018-02-24: 10 ug/min via INTRAVENOUS

## 2018-02-24 MED ORDER — SODIUM CHLORIDE 0.9% FLUSH: 3.0000 mL | INTRAVENOUS | Status: DC | PRN

## 2018-02-24 MED ORDER — MORPHINE SULFATE (PF) 2 MG/ML IV SOLN: 2.0000 mg | INTRAVENOUS | Status: DC | PRN

## 2018-02-24 MED ORDER — VERAPAMIL HCL 2.5 MG/ML IV SOLN
INTRAVENOUS | Status: DC | PRN
  Administered 2018-02-24: 10 mL via INTRA_ARTERIAL

## 2018-02-24 MED ORDER — ONDANSETRON HCL 4 MG/2ML IJ SOLN: 4.0000 mg | Freq: Four times a day (QID) | INTRAMUSCULAR | Status: DC | PRN

## 2018-02-24 MED ORDER — IOHEXOL 350 MG/ML SOLN
INTRAVENOUS | Status: DC | PRN
  Administered 2018-02-24: 150 mL via INTRA_ARTERIAL

## 2018-02-24 MED ORDER — HEPARIN (PORCINE) IN NACL 1000-0.9 UT/500ML-% IV SOLN
INTRAVENOUS | Status: DC | PRN
  Administered 2018-02-24 (×2): 500 mL

## 2018-02-24 MED ORDER — SODIUM CHLORIDE 0.9 % IV SOLN: 250.0000 mL | INTRAVENOUS | Status: DC | PRN

## 2018-02-24 MED ORDER — ACETAMINOPHEN 325 MG PO TABS: 650.0000 mg | ORAL_TABLET | ORAL | Status: DC | PRN

## 2018-02-24 MED ORDER — ATORVASTATIN CALCIUM 10 MG PO TABS: 10.0000 mg | ORAL_TABLET | Freq: Every day | ORAL | Status: DC

## 2018-02-24 MED ORDER — FAMOTIDINE IN NACL 20-0.9 MG/50ML-% IV SOLN
INTRAVENOUS | Status: AC
  Filled 2018-02-24: qty 50

## 2018-02-24 MED ORDER — LABETALOL HCL 5 MG/ML IV SOLN: 10.0000 mg | INTRAVENOUS | Status: AC | PRN

## 2018-02-24 MED ORDER — CLOPIDOGREL BISULFATE 75 MG PO TABS
75.0000 mg | ORAL_TABLET | Freq: Every day | ORAL | Status: DC
  Administered 2018-02-25 – 2018-02-27 (×3): 75 mg via ORAL
  Filled 2018-02-24 (×3): qty 1

## 2018-02-24 MED ORDER — ACETAMINOPHEN 500 MG PO TABS: 250.0000 mg | ORAL_TABLET | Freq: Two times a day (BID) | ORAL | Status: DC | PRN

## 2018-02-24 MED ORDER — MIDAZOLAM HCL 2 MG/2ML IJ SOLN
INTRAMUSCULAR | Status: AC
  Filled 2018-02-24: qty 2

## 2018-02-24 MED ORDER — CLOPIDOGREL BISULFATE 300 MG PO TABS
ORAL_TABLET | ORAL | Status: DC | PRN
  Administered 2018-02-24: 600 mg via ORAL

## 2018-02-24 MED ORDER — FAMOTIDINE IN NACL 20-0.9 MG/50ML-% IV SOLN
INTRAVENOUS | Status: AC | PRN
  Administered 2018-02-24: 20 mg via INTRAVENOUS

## 2018-02-24 MED ORDER — FENTANYL CITRATE (PF) 100 MCG/2ML IJ SOLN
INTRAMUSCULAR | Status: AC
  Filled 2018-02-24: qty 2

## 2018-02-24 MED ORDER — LIDOCAINE HCL (PF) 1 % IJ SOLN
INTRAMUSCULAR | Status: DC | PRN
  Administered 2018-02-24: 2 mL

## 2018-02-24 MED ORDER — HEPARIN (PORCINE) IN NACL 1000-0.9 UT/500ML-% IV SOLN
INTRAVENOUS | Status: AC
  Filled 2018-02-24: qty 1000

## 2018-02-24 MED ORDER — METOPROLOL TARTRATE 25 MG PO TABS
25.0000 mg | ORAL_TABLET | Freq: Two times a day (BID) | ORAL | Status: DC
  Administered 2018-02-24 – 2018-02-26 (×4): 25 mg via ORAL
  Filled 2018-02-24 (×4): qty 1

## 2018-02-24 MED ORDER — FENTANYL CITRATE (PF) 100 MCG/2ML IJ SOLN
INTRAMUSCULAR | Status: DC | PRN
  Administered 2018-02-24: 25 ug via INTRAVENOUS

## 2018-02-24 MED ORDER — DILTIAZEM HCL ER COATED BEADS 180 MG PO CP24
180.0000 mg | ORAL_CAPSULE | Freq: Every day | ORAL | Status: DC
  Administered 2018-02-25 – 2018-02-27 (×3): 180 mg via ORAL
  Filled 2018-02-24 (×3): qty 1

## 2018-02-24 MED ORDER — SODIUM CHLORIDE 0.9 % WEIGHT BASED INFUSION: 1.0000 mL/kg/h | INTRAVENOUS | Status: DC

## 2018-02-24 MED ORDER — ATORVASTATIN CALCIUM 80 MG PO TABS
80.0000 mg | ORAL_TABLET | Freq: Every day | ORAL | Status: DC
  Administered 2018-02-24 – 2018-02-26 (×3): 80 mg via ORAL
  Filled 2018-02-24 (×3): qty 1

## 2018-02-24 MED ORDER — HEPARIN SODIUM (PORCINE) 1000 UNIT/ML IJ SOLN
INTRAMUSCULAR | Status: AC
  Filled 2018-02-24: qty 1

## 2018-02-24 MED ORDER — MIDAZOLAM HCL 2 MG/2ML IJ SOLN: INTRAMUSCULAR | Status: DC | PRN

## 2018-02-24 MED ORDER — FAMOTIDINE 20 MG PO TABS
20.0000 mg | ORAL_TABLET | Freq: Every day | ORAL | Status: DC
  Administered 2018-02-24 – 2018-02-26 (×3): 20 mg via ORAL
  Filled 2018-02-24 (×3): qty 1

## 2018-02-24 MED ORDER — BIVALIRUDIN BOLUS VIA INFUSION - CUPID
INTRAVENOUS | Status: DC | PRN
  Administered 2018-02-24: 51 mg via INTRAVENOUS

## 2018-02-24 MED ORDER — LIDOCAINE HCL (PF) 1 % IJ SOLN
INTRAMUSCULAR | Status: AC
  Filled 2018-02-24: qty 30

## 2018-02-24 MED ORDER — ASPIRIN 81 MG PO CHEW: 81.0000 mg | CHEWABLE_TABLET | Freq: Every day | ORAL | Status: DC

## 2018-02-24 MED ORDER — NITROGLYCERIN 1 MG/10 ML FOR IR/CATH LAB
INTRA_ARTERIAL | Status: AC
  Filled 2018-02-24: qty 10

## 2018-02-24 SURGICAL SUPPLY — 24 items
BALLN SAPPHIRE 2.0X12 (BALLOONS) ×2
BALLN SAPPHIRE 2.5X15 (BALLOONS) ×2
BALLOON SAPPHIRE 2.0X12 (BALLOONS) ×1 IMPLANT
BALLOON SAPPHIRE 2.5X15 (BALLOONS) ×1 IMPLANT
CATH INFINITI 5FR ANG PIGTAIL (CATHETERS) ×2 IMPLANT
CATH LAUNCHER 6FR JR4 (CATHETERS) ×2 IMPLANT
CATH OPTITORQUE TIG 4.0 5F (CATHETERS) ×2 IMPLANT
DEVICE RAD COMP TR BAND LRG (VASCULAR PRODUCTS) ×2 IMPLANT
ELECT DEFIB PAD ADLT CADENCE (PAD) ×2 IMPLANT
GLIDESHEATH SLEND A-KIT 6F 22G (SHEATH) ×2 IMPLANT
GUIDELINER 6F (CATHETERS) ×2 IMPLANT
GUIDEWIRE INQWIRE 1.5J.035X260 (WIRE) ×1 IMPLANT
INQWIRE 1.5J .035X260CM (WIRE) ×2
KIT ENCORE 26 ADVANTAGE (KITS) ×2 IMPLANT
KIT HEART LEFT (KITS) ×2 IMPLANT
PACK CARDIAC CATHETERIZATION (CUSTOM PROCEDURE TRAY) ×2 IMPLANT
STENT SYNERGY DES 2.75X20 (Permanent Stent) ×2 IMPLANT
STENT SYNERGY DES 3X32 (Permanent Stent) ×2 IMPLANT
STENT SYNERGY DES 3X38 (Permanent Stent) ×4 IMPLANT
SYR MEDRAD MARK 7 150ML (SYRINGE) ×2 IMPLANT
TRANSDUCER W/STOPCOCK (MISCELLANEOUS) ×2 IMPLANT
TUBING CIL FLEX 10 FLL-RA (TUBING) ×2 IMPLANT
WIRE HI TORQ VERSACORE-J 145CM (WIRE) ×2 IMPLANT
WIRE SION BLUE 180 (WIRE) ×2 IMPLANT

## 2018-02-24 NOTE — Interval H&P Note (Signed)
Cath Lab Visit (complete for each Cath Lab visit)  Clinical Evaluation Leading to the Procedure:   ACS: Yes.    Non-ACS:    Anginal Classification: CCS II  Anti-ischemic medical therapy: Minimal Therapy (1 class of medications)  Non-Invasive Test Results: No non-invasive testing performed  Prior CABG: No previous CABG      History and Physical Interval Note:  02/24/2018 4:12 PM  [name]  has presented today for surgery, with the diagnosis of ua  The various methods of treatment have been discussed with the patient and family. After consideration of risks, benefits and other options for treatment, the patient has consented to  Procedure(s): LEFT HEART CATH AND CORONARY ANGIOGRAPHY (N/A) as a surgical intervention .  The patient's history has been reviewed, patient examined, no change in status, stable for surgery.  I have reviewed the patient's chart and labs.  Questions were answered to the patient's satisfaction.     Quay Burow

## 2018-02-24 NOTE — Telephone Encounter (Signed)
Patient called to give progress report - stated he's feeling somewhat better - he has an appointment with a cardiologist today.  Ph# [phone]

## 2018-02-25 ENCOUNTER — Encounter (HOSPITAL_COMMUNITY): Payer: Self-pay | Admitting: Cardiovascular Disease

## 2018-02-25 ENCOUNTER — Other Ambulatory Visit: Payer: Self-pay

## 2018-02-25 ENCOUNTER — Inpatient Hospital Stay (HOSPITAL_COMMUNITY): Payer: Medicare Other

## 2018-02-25 DIAGNOSIS — I2542 Coronary artery dissection: Secondary | ICD-10-CM

## 2018-02-25 DIAGNOSIS — I340 Nonrheumatic mitral (valve) insufficiency: Secondary | ICD-10-CM

## 2018-02-25 LAB — CBC
HCT: 41.5 % (ref 39.0–52.0)
Hemoglobin: 13 g/dL (ref 13.0–17.0)
MCH: 28 pg (ref 26.0–34.0)
MCHC: 31.3 g/dL (ref 30.0–36.0)
MCV: 89.2 fL (ref 80.0–100.0)
NRBC: 0 % (ref 0.0–0.2)
Platelets: 299 10*3/uL (ref 150–400)
RBC: 4.65 MIL/uL (ref 4.22–5.81)
RDW: 12.7 % (ref 11.5–15.5)
WBC: 11.8 10*3/uL — AB (ref 4.0–10.5)

## 2018-02-25 LAB — BASIC METABOLIC PANEL
Anion gap: 12 (ref 5–15)
BUN: 8 mg/dL (ref 8–23)
CO2: 22 mmol/L (ref 22–32)
Calcium: 9 mg/dL (ref 8.9–10.3)
Chloride: 103 mmol/L (ref 98–111)
Creatinine, Ser: 0.97 mg/dL (ref 0.61–1.24)
GFR calc Af Amer: >60 mL/min (ref >60)
GFR calc non Af Amer: >60 mL/min (ref >60)
Glucose, Bld: 121 mg/dL — ABNORMAL HIGH (ref 70–99)
POTASSIUM: 4.2 mmol/L (ref 3.5–5.1)
Sodium: 137 mmol/L (ref 135–145)

## 2018-02-25 LAB — TROPONIN I
Troponin I: 0.34 ng/mL (ref <0.03)
Troponin I: 4.27 ng/mL (ref <0.03)
Troponin I: 7.67 ng/mL (ref <0.03)

## 2018-02-25 LAB — ECHOCARDIOGRAM COMPLETE
Height: 68 in
Weight: 2400 oz

## 2018-02-25 MED FILL — Midazolam HCl Inj 2 MG/2ML (Base Equivalent): INTRAMUSCULAR | Qty: 2 | Status: AC

## 2018-02-25 NOTE — Progress Notes (Signed)
  Echocardiogram 2D Echocardiogram has been performed.  Darlina Sicilian M 02/25/2018, 1:15 PM

## 2018-02-25 NOTE — Progress Notes (Signed)
MD Tamala Julian aware of elevated Troponin. Will continue to ambulate patient and monitor.  Shella Spearing, RN

## 2018-02-25 NOTE — Progress Notes (Addendum)
The patient has been seen in conjunction with Roby Lofts. All aspects of care have been considered and discussed. The patient has been personally interviewed, examined, and all clinical data has been reviewed.   Troponin I is 4.27 this a.m.  ECG demonstrates resolution of ST elevation that was present last evening.  Tall R wave in V1 consistent with posterior wall infarct.  Asymptomatic with ambulation.  Spiral dissection complicating RCA intervention.  Patient is doing well this morning.  Nice angiographic result obtained after recurrence of dissection.  Plan to cycle cardiac markers.  Ambulate with cardiac rehab.  Debate whether or not repeat cath is needed if he does well clinically.  If angina with activity, restudy is unquestionable.  Will speak to Dr. [name] before making any final decision.  We will hold n.p.o. after midnight in case angiography is required.  Progress Note  Patient Name: [name] Date of Encounter: 02/25/2018  Primary Cardiologist: Quay Burow, MD   Subjective   Notes a little GI discomfort this morning with som gas which he attributes to not eating much over the past 2 days. He denies any chest pain but has not been out of bed much. Denies SOB or palpitations.   Inpatient Medications    Scheduled Meds: . aspirin EC  81 mg Oral Daily  . atorvastatin  80 mg Oral q1800  . clopidogrel  75 mg Oral Q breakfast  . diltiazem  180 mg Oral Daily  . famotidine  20 mg Oral QHS  . metoprolol tartrate  25 mg Oral BID  . sodium chloride flush  3 mL Intravenous Q12H   Continuous Infusions: . sodium chloride    . nitroGLYCERIN Stopped (02/25/18 0239)   PRN Meds: sodium chloride, acetaminophen, morphine injection, ondansetron (ZOFRAN) IV, sodium chloride flush   Vital Signs    Vitals:   02/25/18 0500 02/25/18 0600 02/25/18 0700 02/25/18 0809  BP: 131/71 (!) 147/69 133/70   Pulse: (!) 49 (!) 56 (!) 55   Resp: 13 16 14    Temp:    98.3 F (36.8  C)  TempSrc:    Oral  SpO2: 96% 97% 97%   Weight:      Height:        Intake/Output Summary (Last 24 hours) at 02/25/2018 0851 Last data filed at 02/25/2018 0600 Gross per 24 hour  Intake 975.78 ml  Output 1100 ml  Net -124.22 ml   Filed Weights   02/24/18 1257  Weight: 68 kg    Telemetry    NSR/sinus bradycardia with occasional PVCs - Personally Reviewed  Physical Exam   GEN: Laying in bed in no acute distress.   Neck: No JVD, no carotid bruits Cardiac: RRR, no murmurs, rubs, or gallops. Right radial cath site C/D/I, without ecchymosis or hematoma Respiratory: Clear to auscultation bilaterally, no wheezes/ rales/ rhonchi GI: NABS, Soft, nontender, non-distended  MS: No edema; No deformity. Neuro:  Nonfocal, moving all extremities spontaneously Psych: Normal affect   Labs    Chemistry Recent Labs  Lab 02/19/18 0859  NA 136  K 5.0  CL 99  CO2 24  GLUCOSE 113*  BUN 7*  CREATININE 0.93  CALCIUM 9.6  GFRNONAA 82  GFRAA 95     Hematology Recent Labs  Lab 02/19/18 0859  WBC 7.4  RBC 4.50  HGB 13.0  HCT 39.9  MCV 89  MCH 28.9  MCHC 32.6  RDW 12.6  PLT 311    Cardiac Enzymes Recent Labs  Lab  02/24/18 1814 02/24/18 2322  TROPONINI 0.03* 0.34*   No results for input(s): TROPIPOC in the last 168 hours.   BNPNo results for input(s): BNP, PROBNP in the last 168 hours.   DDimer No results for input(s): DDIMER in the last 168 hours.   Radiology    No results found.  Cardiac Studies   Left heart catheterization 02/24/18:   Mid RCA lesion is 95% stenosed.  A stent was successfully placed.  Post intervention, there is a 0% residual stenosis.  Post Atrio lesion is 100% stenosed.  The left ventricular systolic function is normal.  LV end diastolic pressure is normal.  The left ventricular ejection fraction is 55-65% by visual estimate.   IMPRESSION: PCI and drug-eluting stenting of the entire dominant RCA using overlapping synergy  drug-eluting stents in the setting of accelerated angina with coronary dissection possibly related to the guide liner.  I placed 4 overlapping synergy stents.  There was TIMI-3 flow down to the PDA however there was TIMI I flow in the PLA system.  Patient remained hemodynamically stable throughout the procedure.  He did receive 200 mcg of intracoronary nitroglycerin and was placed on IV nitroglycerin drip.  Angiomax will continue full dose for 4 hours.  The sheath was removed and a TR band was placed on the right wrist to achieve patent hemostasis.  The patient left the lab in stable condition.  He will need relook angiography in 2 days.  He will also need uninterrupted dual antiplatelet therapy for at least 12 months and possibly indefinitely given the extensive nature of his stenting.  Patient Profile     72 y.o. male with a PMH of paroxysmal atrial fibrillation, HTN, HLD, and GERD, who presented for cardiac catheterization to evaluate unstable angina.   Assessment & Plan    1. Unstable angina: patient underwent LHC 02/24/18 and was found to have 95% stenosis of his RCA and 100% stenosis post-atrio vessel. His cath was complicated by RCA dissection, managed with 4 overlapping stents to his RCA. He was maintained on angiomax for 4 hours post cath and recommended for DAPT with aspirin x6mo and plavix for at least 12 months. EKG this morning with sinus brady, non-ischemic. Trop up to 0.34 post-PCI. No recurrent angina but patient has not been out of bed much.  - Encourage ambulation today - Will tentatively plan for relook cath tomorrow with Dr. Ellyn Hack at ~11:30am, although may not be necessary if no recurrent angina.  - Echo pending - Continue aspirin, plavix, and statin (atorvastatin increased to 80mg  daily)  2. Paroxysmal atrial fibrillation: maintaining sinus rhythm/sinus brady this admission. Home eliquis held in prep for LHC and remains on hold given need for re-look cath tomorrow - Continue  diltiazem and metoprolol for rate control - Continue to hold eliquis until procedures are completed  3. HTN: BP persistently elevated yesterday, likely in the setting of home medications being held yesterday  - Continue home diltiazem and metoprolol for now  4. HLD: LDL 93, triglycerides 150 08/2017 on atorvastatin 10mg  daily; goal LDL <70 - Atorvastatin increased to 80mg  daily given CAD on cath.   For questions or updates, please contact [place] Please consult [email or web address] for contact info under Cardiology/STEMI.      Signed, Abigail Butts, PA-C  02/25/2018, 8:51 AM   [phone]

## 2018-02-25 NOTE — Progress Notes (Signed)
CARDIAC REHAB PHASE I   PRE:  Rate/Rhythm: 12 SB  BP:  Sitting: 145/71      SaO2: 97 RA  MODE:  Ambulation: 370 ft   POST:  Rate/Rhythm: 72 SR with PVCs  BP:  Sitting: 160/68    SaO2: 100 RA   Pt ambulated 347ft assist of one with slow steady gait. Pt c/o of "heaviness" in the center of his chest and fatigue. Pt anxious about the possibility of another cath tomorrow, but also anxious about not having it either. Support provided to pt. Pt given stent card and heart healthy diet. Will continue to follow the pt and await a plan.  [id number] Rufina Falco, RN BSN 02/25/2018 2:07 PM

## 2018-02-26 ENCOUNTER — Encounter (HOSPITAL_COMMUNITY)
Admission: AD | Disposition: A | Discharge status: Home / Self Care | Payer: Self-pay | Source: Ambulatory Visit | Attending: Cardiovascular Disease

## 2018-02-26 DIAGNOSIS — I214 Non-ST elevation (NSTEMI) myocardial infarction: Secondary | ICD-10-CM

## 2018-02-26 LAB — CBC
HCT: 39 % (ref 39.0–52.0)
Hemoglobin: 12.7 g/dL — ABNORMAL LOW (ref 13.0–17.0)
MCH: 28.6 pg (ref 26.0–34.0)
MCHC: 32.6 g/dL (ref 30.0–36.0)
MCV: 87.8 fL (ref 80.0–100.0)
Platelets: 265 10*3/uL (ref 150–400)
RBC: 4.44 MIL/uL (ref 4.22–5.81)
RDW: 12.8 % (ref 11.5–15.5)
WBC: 11 10*3/uL — AB (ref 4.0–10.5)
nRBC: 0 % (ref 0.0–0.2)

## 2018-02-26 LAB — BASIC METABOLIC PANEL
Anion gap: 9 (ref 5–15)
BUN: 10 mg/dL (ref 8–23)
CHLORIDE: 104 mmol/L (ref 98–111)
CO2: 24 mmol/L (ref 22–32)
Calcium: 9.2 mg/dL (ref 8.9–10.3)
Creatinine, Ser: 0.87 mg/dL (ref 0.61–1.24)
GFR calc Af Amer: >60 mL/min (ref >60)
GFR calc non Af Amer: >60 mL/min (ref >60)
GLUCOSE: 111 mg/dL — AB (ref 70–99)
Potassium: 4 mmol/L (ref 3.5–5.1)
Sodium: 137 mmol/L (ref 135–145)

## 2018-02-26 LAB — TROPONIN I
Troponin I: 9.14 ng/mL (ref <0.03)
Troponin I: 3.56 ng/mL (ref <0.03)

## 2018-02-26 SURGERY — LEFT HEART CATH AND CORONARY ANGIOGRAPHY
Anesthesia: LOCAL

## 2018-02-26 MED ORDER — APIXABAN 5 MG PO TABS
5.0000 mg | ORAL_TABLET | Freq: Two times a day (BID) | ORAL | Status: DC
  Administered 2018-02-26 – 2018-02-27 (×3): 5 mg via ORAL
  Filled 2018-02-26 (×3): qty 1

## 2018-02-26 MED ORDER — SODIUM CHLORIDE 0.9 % IV SOLN
INTRAVENOUS | Status: DC
  Administered 2018-02-26: 08:00:00 via INTRAVENOUS

## 2018-02-26 MED ORDER — METOPROLOL TARTRATE 12.5 MG HALF TABLET
12.5000 mg | ORAL_TABLET | Freq: Two times a day (BID) | ORAL | Status: DC
  Administered 2018-02-26 – 2018-02-27 (×2): 12.5 mg via ORAL
  Filled 2018-02-26 (×2): qty 1

## 2018-02-26 MED ORDER — SODIUM CHLORIDE 0.9% FLUSH
3.0000 mL | Freq: Two times a day (BID) | INTRAVENOUS | Status: DC
  Administered 2018-02-26: 3 mL via INTRAVENOUS

## 2018-02-26 MED ORDER — SODIUM CHLORIDE 0.9 % IV SOLN: INTRAVENOUS | Status: DC

## 2018-02-26 MED ORDER — PNEUMOCOCCAL VAC POLYVALENT 25 MCG/0.5ML IJ INJ: 0.5000 mL | INJECTION | INTRAMUSCULAR | Status: DC

## 2018-02-26 MED ORDER — MAGNESIUM HYDROXIDE 400 MG/5ML PO SUSP
15.0000 mL | Freq: Every day | ORAL | Status: DC
  Administered 2018-02-26: 15 mL via ORAL
  Filled 2018-02-26: qty 30

## 2018-02-26 MED ORDER — SENNOSIDES-DOCUSATE SODIUM 8.6-50 MG PO TABS
1.0000 | ORAL_TABLET | Freq: Two times a day (BID) | ORAL | Status: DC
  Administered 2018-02-26 – 2018-02-27 (×2): 1 via ORAL
  Filled 2018-02-26 (×2): qty 1

## 2018-02-26 MED ORDER — SODIUM CHLORIDE 0.9% FLUSH: 3.0000 mL | INTRAVENOUS | Status: DC | PRN

## 2018-02-26 MED ORDER — SODIUM CHLORIDE 0.9 % IV SOLN: 250.0000 mL | INTRAVENOUS | Status: DC | PRN

## 2018-02-26 NOTE — Progress Notes (Addendum)
1        Progress Note  Patient Name: [name] Date of Encounter: 02/26/2018  Primary Cardiologist: Quay Burow, MD   Subjective   Had some mild tightness in the chest yesterday.  Result was discussed at cath conference.  The patient is anxious about the possibility of cath.  Inpatient Medications    Scheduled Meds: . aspirin EC  81 mg Oral Daily  . atorvastatin  80 mg Oral q1800  . clopidogrel  75 mg Oral Q breakfast  . diltiazem  180 mg Oral Daily  . famotidine  20 mg Oral QHS  . magnesium hydroxide  15 mL Oral Daily  . metoprolol tartrate  25 mg Oral BID  . [START ON 02/27/2018] pneumococcal 23 valent vaccine  0.5 mL Intramuscular Tomorrow-1000  . sodium chloride flush  3 mL Intravenous Q12H  . sodium chloride flush  3 mL Intravenous Q12H   Continuous Infusions: . sodium chloride    . sodium chloride    . sodium chloride 50 mL/hr at 02/26/18 0800  . nitroGLYCERIN Stopped (02/25/18 0239)   PRN Meds: sodium chloride, sodium chloride, acetaminophen, morphine injection, ondansetron (ZOFRAN) IV, sodium chloride flush, sodium chloride flush   Vital Signs    Vitals:   02/26/18 0500 02/26/18 0600 02/26/18 0700 02/26/18 0802  BP: (!) 104/56 (!) 118/58  (!) 154/63  Pulse: (!) 48 (!) 53 73   Resp: 15 14 18    Temp:   (!) 97.2 F (36.2 C)   TempSrc:   Oral   SpO2: 96% 95% 99%   Weight:      Height:        Intake/Output Summary (Last 24 hours) at 02/26/2018 0818 Last data filed at 02/26/2018 0814 Gross per 24 hour  Intake 1565.31 ml  Output 1400 ml  Net 165.31 ml   Filed Weights   02/24/18 1257  Weight: 68 kg    Telemetry    Normal sinus rhythm- Personally Reviewed  ECG    Tall R wave V1 without acute ST-T change.- Personally Reviewed  Physical Exam  Anxious GEN: No acute distress.   Neck: No JVD Cardiac: RRR, no murmurs, rubs, or gallops.  Respiratory: Clear to auscultation bilaterally. GI: Soft, nontender, non-distended  MS: No edema; No  deformity. Neuro:  Nonfocal  Psych: Normal affect   Labs    Chemistry Recent Labs  Lab 02/19/18 0859 02/25/18 0908 02/26/18 0255  NA 136 137 137  K 5.0 4.2 4.0  CL 99 103 104  CO2 24 22 24   GLUCOSE 113* 121* 111*  BUN 7* 8 10  CREATININE 0.93 0.97 0.87  CALCIUM 9.6 9.0 9.2  GFRNONAA 82 >60 >60  GFRAA 95 >60 >60  ANIONGAP  --  12 9     Hematology Recent Labs  Lab 02/19/18 0859 02/25/18 0908 02/26/18 0255  WBC 7.4 11.8* 11.0*  RBC 4.50 4.65 4.44  HGB 13.0 13.0 12.7*  HCT 39.9 41.5 39.0  MCV 89 89.2 87.8  MCH 28.9 28.0 28.6  MCHC 32.6 31.3 32.6  RDW 12.6 12.7 12.8  PLT 311 299 265    Cardiac Enzymes Recent Labs  Lab 02/24/18 2322 02/25/18 0908 02/25/18 1514 02/25/18 2201  TROPONINI 0.34* 4.27* 7.67* 9.14*   No results for input(s): TROPIPOC in the last 168 hours.   BNPNo results for input(s): BNP, PROBNP in the last 168 hours.   DDimer No results for input(s): DDIMER in the last 168 hours.   Radiology  No results found.  Cardiac Studies   Coronary angiogram images reviewed.  Patient Profile     72 y.o. male a PMH of paroxysmal atrial fibrillation, HTN, HLD, and GERD, who presented for cardiac catheterization to evaluate unstable angina.   Assessment & Plan    1. Coronary atherosclerosis with unstable angina treated with percutaneous intervention complicated by spiral dissection. 2. History of paroxysmal atrial fibrillation: Plan to reinstitute Eliquis today. 3. Essential hypertension reasonably controlled. 4. Anticoagulation therapy and dual antiplatelet therapy: Aspirin/Plavix/Eliquis.  Drop aspirin after 2 weeks.  Likely discharge later today or in a.m. Discontinue n.p.o.  For questions or updates, please contact [place] Please consult [email or web address] for contact info under Cardiology/STEMI.      Signed, Sinclair Grooms, MD  02/26/2018, 8:18 AM

## 2018-02-26 NOTE — Progress Notes (Addendum)
ANTICOAGULATION CONSULT NOTE - Initial Consult  Pharmacy Consult for apixaban Indication: atrial fibrillation  Allergies  Allergen Reactions  . Lisinopril Swelling    H/o of angioedema   . Chlorthalidone Other (See Comments)    Causes severe hyponatremia  . Flexeril [Cyclobenzaprine]     Unknown reaction  . Hyoscyamine Swelling  . Prevacid [Lansoprazole] Swelling  . Valium [Diazepam] Other (See Comments)    "makes me feel crazy"  . Potassium-Containing Compounds Palpitations    Patient Measurements: Height: 5\' 8"  (172.7 cm) Weight: 150 lb (68 kg) IBW/kg (Calculated) : 68.4  Vital Signs: Temp: 97.2 F (36.2 C) (12/11 0700) Temp Source: Oral (12/11 0700) BP: 154/63 (12/11 0802) Pulse Rate: 73 (12/11 0700)  Labs: Recent Labs    02/25/18 0908 02/25/18 1514 02/25/18 2201 02/26/18 0255  HGB 13.0  --   --  12.7*  HCT 41.5  --   --  39.0  PLT 299  --   --  265  CREATININE 0.97  --   --  0.87  TROPONINI 4.27* 7.67* 9.14*  --     Estimated Creatinine Clearance: 74.9 mL/min (by C-G formula based on SCr of 0.87 mg/dL).   Medical History: Past Medical History:  Diagnosis Date  . Anxiety   . Atrial fibrillation ([place])   . 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 Chest pain    myoview 07/09/12-normal, nl ef; echo 04/12/05-ef>55%, mild aortic sclerosis  . GERD (gastroesophageal reflux disease)   . H. pylori infection   . Headache   . Hypercholesteremia   . Hyperlipidemia   . Hypertension   . Hyponatremia 2013  . Palpitations    event [facility] 04/11/05- SR with occ PAC  . Personal history of colonic polyps 2 9 2007    Medications:  Medications Prior to Admission  Medication Sig Dispense Refill Last Dose  . acetaminophen (TYLENOL) 500 MG tablet Take 250 mg by mouth 2 (two) times daily as needed for moderate pain or headache.   02/23/2018 at Unknown time  . apixaban (ELIQUIS) 5 MG TABS tablet Take 1 tablet (5 mg total) by mouth 2 (two) times daily. 60 tablet 2 02-22-18 at 1800   . aspirin EC 81 MG tablet Take 1 tablet (81 mg total) by mouth daily.   02/24/2018 at 0730  . atorvastatin (LIPITOR) 10 MG tablet Take 10 mg by mouth at bedtime.    02/23/2018 at Unknown time  . diltiazem (CARDIZEM CD) 180 MG 24 hr capsule Take 1 capsule (180 mg total) by mouth daily. 30 capsule 12 02/24/2018 at 0730  . famotidine (PEPCID) 20 MG tablet Take 1 tablet (20 mg total) by mouth at bedtime. (Patient taking differently: Take 10 mg by mouth at bedtime. )   Past Week at Unknown time  . isosorbide mononitrate (IMDUR) 30 MG 24 hr tablet Take 1 tablet (30 mg total) by mouth daily. 30 tablet 1 02/24/2018 at Unknown time  . metoprolol tartrate (LOPRESSOR) 25 MG tablet Take 1 tablet (25 mg total) by mouth 2 (two) times daily. (Patient taking differently: Take 12.5 mg by mouth 2 (two) times daily. ) 180 tablet 3 02/24/2018 at 0745  . pantoprazole (PROTONIX) 40 MG tablet Take 40 mg by mouth daily.    Past Week at Unknown time    Assessment: 5 YOM with h/o of PAF on apixaban at home who presented with chest pain. S/p LHC and coronary angiography on 12/11. Pharmacy consulted to resume apixaban. CBC reviewed. SCr 0.87  Goal of Therapy:  Stroke prevention Monitor platelets by anticoagulation protocol: Yes   Plan:  -Resume apixaban 5 mg twice daily  -Monitor for s/s of bleeding   Albertina Parr, PharmD., BCPS Clinical Pharmacist Clinical phone for 02/26/18 until 3:30pm: [phone] If after 3:30pm, please refer to [facility] for unit-specific pharmacist

## 2018-02-26 NOTE — Progress Notes (Signed)
Patient ambulated 392ft in hall without sopping to catch breath. Oxygen saturation on room air was greater than 96% during this time.

## 2018-02-26 NOTE — Progress Notes (Signed)
Notified Dr.Smith that patients HR was dropping and sustaining in the 40's. C/O being tired and dizzy when in the 40's. Was taking Cardiem XR 180mg  and Metoprolol 12.5mg  BID at home. Since admission, Metoprolol has increased to 25mg  BID. Prior to receiving both meds this am, his HR was in the 80's. Dr.Smith states to cancel the Metoprolol for now and he will take a look at his medications. Will continue to monitor closely.

## 2018-02-26 NOTE — Progress Notes (Signed)
Patient ambulated 560 ft in hall. Only had to stop twice d/t low O2 sats in the 60's. Seemed to recover quicker than with his earlier walk.

## 2018-02-26 NOTE — Discharge Summary (Addendum)
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 Lisinopril Swelling    H/o of angioedema   . Chlorthalidone Other (See Comments)    Causes severe hyponatremia  . Flexeril [Cyclobenzaprine]     Unknown reaction  . Hyoscyamine Swelling  . Prevacid [Lansoprazole] Swelling  . Valium [Diazepam] Other (See Comments)    "makes me feel crazy"  . Potassium-Containing Compounds Palpitations    Diagnostic Studies/Procedures    Echo 02/25/18 Study Conclusions  - Left ventricle: The cavity size was normal. Systolic function was   normal. The estimated ejection fraction was in the range of 55%   to 60%. Hypokinesis of the basal-midinferolateral myocardium;   consistent with ischemia or infarction in the distribution of the   right coronary or left circumflex coronary artery. Left   ventricular diastolic function parameters were normal. - Aortic valve: Valve area (Vmax): 2.45 cm^2. - Mitral valve: There was mild regurgitation.  CORONARY STENT INTERVENTION  02/26/18  LEFT HEART CATH AND CORONARY ANGIOGRAPHY  Conclusion     Mid RCA lesion is 95% stenosed.  A stent was successfully placed.  Post intervention, there is a 0% residual stenosis.  Post Atrio lesion is 100% stenosed.  The left ventricular systolic function is normal.  LV end diastolic pressure is normal.  The left ventricular ejection fraction is 55-65% by visual estimate.     IMPRESSION: PCI and drug-eluting stenting of the entire dominant RCA using overlapping synergy  drug-eluting stents in the setting of accelerated angina with coronary dissection possibly related to the guide liner.  I placed 4 overlapping synergy stents.  There was TIMI-3 flow down to the PDA however there was TIMI I flow in the PLA system.  Patient remained hemodynamically stable throughout the procedure.  He did receive 200 mcg of intracoronary nitroglycerin and was placed on IV nitroglycerin drip.  Angiomax will continue full dose for 4 hours.  The sheath was removed and a TR band was placed on the right wrist to achieve patent hemostasis.  The patient left the lab in stable condition.  He will need relook angiography in 2 days.  He will also need uninterrupted dual antiplatelet therapy for at least 12 months and possibly indefinitely given the extensive nature of his stenting.   Diagnostic  Dominance: Right    Intervention      History of Present Illness      [name] is a pleasant 72 year old Caucasian male followed by Dr. Alvester Chou with a history of PAF presented for outpatient cath.   He had a low risk Myoview in 2014 for chest pain.  He is followed by Dr. Melony Overly in [place] for gastroesophageal reflux.  He is on a PPI.  He was seen in the emergency room 01/24/2018 and [place] with chest pain.  His troponins were negative and his EKG was normal.   Seen in clinic by APP 02/19/18.  The patient described exertional upper chest discomfort for the past month or so.  He gets it when he was walking his dog.  It is relieved with rest.  The other day he became mildly diaphoretic with it.  It does not radiate to his jaw or arms or back.  At rest he has no chest pain.  The patient thought this was reflux but he is being treated for this with a PPI.  He does have a family history of coronary disease, his father had bypass grafting in his 87s. Recommended cath.   Hospital Course     Consultants: None  Patient underwent LHC 02/24/18 and was found to have 95% stenosis of his RCA and 100% stenosis  post-atrio vessel. His cath was complicated by RCA dissection, managed with 4 overlapping stents to his RCA. He was maintained on angiomax for 4 hours post cath and recommended for DAPT with aspirin x79mo and plavix for at least 12 months. Initial plan was for re-look cath given recurrent pain and minimal ST elevation but resolved with conservative management.  Result was discussed at cath conference.  Echo with normal LVEF. Patient had hypoxia with ambulation without chest pain which returned to normal with rest.  No recurrent hypoxia with ambulation.  He had a brief episode of bradycardia in 40s and felt dizzy after increasing metoprolol to 25 mg twice daily (was taking 12.5 mg twice daily at home despite prescription of 25 mg twice daily). Ambuated in hallway without any dyspnea or dizziness prior to discharge.  Plan to continue Cardizem CD 180 MG daily and metoprolol 12.5 mg twice daily.  He will monitor his heart rate frequently at home.  Recommended discontinuation of Cardizem and up titration of metoprolol as outpatient.  Continue triple therapy for 1 month and stop aspirin afterwards and continue Plavix and Eliquis.  Reviewed with Dr. Tamala Julian.  08/27/2017: Cholesterol, Total 174; HDL 51; LDL Calculated 93; Triglycerides 150  -Increase Lipitor to 80 mg daily.  Repeat labs in 6 weeks.  Discharge Vitals Blood pressure 123/64, pulse 61, temperature 97.9 F (36.6 C), temperature source Oral, resp. rate 16, height 5\' 8"  (1.727 m), weight 68 kg, SpO2 98 %.  Filed Weights   02/24/18 1257  Weight: 68 kg   Physical Exam  Constitutional: He is oriented to person, place, and time and well-developed, well-nourished, and in no distress.  HENT:  Head: Normocephalic and atraumatic.  Eyes: Pupils are equal, round, and reactive to light. Conjunctivae and EOM are normal.  Neck: Normal range of motion. Neck supple.  Cardiovascular: Normal rate and regular rhythm.  Right radial cath site without hematoma.    Pulmonary/Chest: Effort normal and breath sounds normal.  Abdominal: Soft. Bowel sounds are normal.  Musculoskeletal: Normal range of motion.  Neurological: He is alert and oriented to person, place, and time.  Skin: Skin is warm and dry.  Psychiatric: Affect normal.    Labs & Radiologic Studies    CBC Recent Labs    02/26/18 0255 02/27/18 0235  WBC 11.0* 10.1  HGB 12.7* 12.8*  HCT 39.0 39.9  MCV 87.8 88.5  PLT 265 536   Basic Metabolic Panel Recent Labs    02/26/18 0255 02/27/18 0235  NA 137 138  K 4.0 5.3*  CL 104 102  CO2 24 26  GLUCOSE 111* 117*  BUN 10 12  CREATININE 0.87 1.00  CALCIUM 9.2 9.4   Cardiac Enzymes Recent Labs    02/25/18 1514 02/25/18 2201 02/26/18 1115  TROPONINI 7.67* 9.14* 3.56*    Disposition   Pt is being discharged home today in good condition.  Follow-up Plans & Appointments    Follow-up Information  Lendon Colonel, NP. Go on 03/03/2018.   Specialties:  Nurse Practitioner, Radiology, Cardiology Why:  @11am  for post hospital follow up Contact information: [address] [place] [address] [phone]          Discharge Instructions    Amb Referral to Cardiac Rehabilitation   Complete by:  As directed    Diagnosis:  Coronary Stents   Diet - low sodium heart healthy   Complete by:  As directed    Discharge instructions   Complete by:  As directed    No driving for until seen in clinic. No lifting over 5 lbs for 1 week. No sexual activity for 1 week.  Keep procedure site clean & dry. If you notice increased pain, swelling, bleeding or pus, call/return!  You may shower, but no soaking baths/hot tubs/pools for 1 week.   Increase activity slowly   Complete by:  As directed       Discharge Medications   Allergies as of 02/27/2018      Reactions   Lisinopril Swelling   H/o of angioedema   Chlorthalidone Other (See Comments)   Causes severe hyponatremia   Flexeril [cyclobenzaprine]     Unknown reaction   Hyoscyamine Swelling   Prevacid [lansoprazole] Swelling   Valium [diazepam] Other (See Comments)   "makes me feel crazy"   Potassium-containing Compounds Palpitations      Medication List    TAKE these medications   acetaminophen 500 MG tablet Commonly known as:  TYLENOL Take 250 mg by mouth 2 (two) times daily as needed for moderate pain or headache.   apixaban 5 MG Tabs tablet Commonly known as:  ELIQUIS Take 1 tablet (5 mg total) by mouth 2 (two) times daily.   aspirin EC 81 MG tablet Take 1 tablet (81 mg total) by mouth daily.   atorvastatin 80 MG tablet Commonly known as:  LIPITOR Take 1 tablet (80 mg total) by mouth at bedtime. What changed:    medication strength  how much to take   clopidogrel 75 MG tablet Commonly known as:  PLAVIX Take 1 tablet (75 mg total) by mouth daily with breakfast. Start taking on:  February 28, 2018   diltiazem 180 MG 24 hr capsule Commonly known as:  CARDIZEM CD Take 1 capsule (180 mg total) by mouth daily.   famotidine 20 MG tablet Commonly known as:  PEPCID Take 1 tablet (20 mg total) by mouth at bedtime. What changed:  how much to take   isosorbide mononitrate 30 MG 24 hr tablet Commonly known as:  IMDUR Take 1 tablet (30 mg total) by mouth daily.   metoprolol tartrate 25 MG tablet Commonly known as:  LOPRESSOR Take 0.5 tablets (12.5 mg total) by mouth 2 (two) times daily.   pantoprazole 40 MG tablet Commonly known as:  PROTONIX Take 40 mg by mouth daily.        Acute coronary syndrome (MI, NSTEMI, STEMI, etc) this admission?: No.    Outstanding Labs/Studies   Lipid panel and LFT in 6 weeks  Duration of Discharge Encounter   Greater than 30 minutes including physician time.  SignedCrista Luria Telford, PA 02/27/2018, 11:09 AM   Agree with note by Robbie Lis PA-C  [name] was stable for discharge.  His troponins peaked at around 9.  He had no subsequent chest pain.  His exam is  benign.  He will follow-up as an outpatient with a midlevel provider and with me several weeks thereafter.  Lorretta Harp, M.D., [place], [facility], [place] [address] [place],   [address]  [phone] 03/03/2018 12:37 PM

## 2018-02-26 NOTE — Progress Notes (Signed)
Patient ambulated 370 ft with standby assist, no hands on. Patient tolerated well except we had to stop 3 times due to O2 sats decreasing and sustaining in the 60's. The first time we stopped, he returned quickly, but by the third time we had to stop for 2-3 minutes before it increased to 90%. Will ambulate patient again in a little bit.

## 2018-02-26 NOTE — Progress Notes (Signed)
CARDIAC REHAB PHASE I   PRE:  Rate/Rhythm: 54 Afib  BP:  Sitting: 123/87      SaO2: 99 RA  MODE:  Ambulation: 370 ft    65 peak HR  POST:  Rate/Rhythm: 50 Afib  BP:  Sitting: 135/59    SaO2: 98 RA   Pt ambulated 345ft in hallway standby assist with slow steady gait. Pt denies CP or SOB. Stent ed completed with pt and wife. Pt educated on importance of ASA, Plavix, statin, and NTG. Stent card and heart healthy diet at bedside. Reviewed restrictions and exercise guidelines. Will refer to CRP II [place].  [id number] Rufina Falco, RN BSN 02/26/2018 10:17 AM

## 2018-02-26 NOTE — Discharge Instructions (Addendum)
Information on my medicine - ELIQUIS (apixaban)  Why was Eliquis prescribed for you? Eliquis was prescribed for you to reduce the risk of a blood clot forming that can cause a stroke if you have a medical condition called atrial fibrillation (a type of irregular heartbeat).  What do You need to know about Eliquis ? Take your Eliquis TWICE DAILY - one tablet in the morning and one tablet in the evening with or without food. If you have difficulty swallowing the tablet whole please discuss with your pharmacist how to take the medication safely.  Take Eliquis exactly as prescribed by your doctor and DO NOT stop taking Eliquis without talking to the doctor who prescribed the medication.  Stopping may increase your risk of developing a stroke.  Refill your prescription before you run out.  After discharge, you should have regular check-up appointments with your healthcare provider that is prescribing your Eliquis.  In the future your dose may need to be changed if your kidney function or weight changes by a significant amount or as you get older.  What do you do if you miss a dose? If you miss a dose, take it as soon as you remember on the same day and resume taking twice daily.  Do not take more than one dose of ELIQUIS at the same time to make up a missed dose.  Important Safety Information A possible side effect of Eliquis is bleeding. You should call your healthcare provider right away if you experience any of the following: ? Bleeding from an injury or your nose that does not stop. ? Unusual colored urine (red or dark brown) or unusual colored stools (red or black). ? Unusual bruising for unknown reasons. ? A serious fall or if you hit your head (even if there is no bleeding).  Some medicines may interact with Eliquis and might increase your risk of bleeding or clotting while on Eliquis. To help avoid this, consult your healthcare provider or pharmacist prior to using any new  prescription or non-prescription medications, including herbals, vitamins, non-steroidal anti-inflammatory drugs (NSAIDs) and supplements.  This website has more information on Eliquis (apixaban): http://www.eliquis.com/eliquis/home  - - - - - - - - - - - - - - - - - - - - - - - - - - - - - - - - - - - - - - - - - - - - - - - - - - - - - - - - - - - - Information about your medication: Plavix (anti-platelet agent)  Generic Name (Brand): clopidogrel (Plavix), once daily medication  PURPOSE: You are taking this medication along with aspirin to lower your chance of having a heart attack, stroke, or blood clots in your heart stent. These can be fatal. Brilinta and aspirin help prevent platelets from sticking together and forming a clot that can block an artery or your stent.   Common SIDE EFFECTS you may experience include: bruising or bleeding more easily, shortness of breath  Do not stop taking PLAVIX without talking to the doctor who prescribes it for you. People who are treated with a stent and stop taking Plavix too soon, have a higher risk of getting a blood clot in the stent, having a heart attack, or dying. If you stop Plavix because of bleeding, or for other reasons, your risk of a heart attack or stroke may increase.   Tell all of your doctors and dentists that you are taking Plavix. They should talk  to the doctor who prescribed plavix for you before you have any surgery or invasive procedure.   Contact your health care provider if you experience: severe or uncontrollable bleeding, pink/red/brown urine, vomiting blood or vomit that looks like "coffee grounds", red or black stools (looks like tar), coughing up blood or blood clots -----------------------------------------------------------------------------------------------------------------

## 2018-02-27 ENCOUNTER — Telehealth: Payer: Self-pay | Admitting: Cardiovascular Disease

## 2018-02-27 LAB — BASIC METABOLIC PANEL
Anion gap: 10 (ref 5–15)
BUN: 12 mg/dL (ref 8–23)
CALCIUM: 9.4 mg/dL (ref 8.9–10.3)
CHLORIDE: 102 mmol/L (ref 98–111)
CO2: 26 mmol/L (ref 22–32)
Creatinine, Ser: 1 mg/dL (ref 0.61–1.24)
GFR calc Af Amer: >60 mL/min (ref >60)
GFR calc non Af Amer: >60 mL/min (ref >60)
Glucose, Bld: 117 mg/dL — ABNORMAL HIGH (ref 70–99)
Potassium: 5.3 mmol/L — ABNORMAL HIGH (ref 3.5–5.1)
Sodium: 138 mmol/L (ref 135–145)

## 2018-02-27 LAB — CBC
HEMATOCRIT: 39.9 % (ref 39.0–52.0)
Hemoglobin: 12.8 g/dL — ABNORMAL LOW (ref 13.0–17.0)
MCH: 28.4 pg (ref 26.0–34.0)
MCHC: 32.1 g/dL (ref 30.0–36.0)
MCV: 88.5 fL (ref 80.0–100.0)
NRBC: 0 % (ref 0.0–0.2)
Platelets: 284 10*3/uL (ref 150–400)
RBC: 4.51 MIL/uL (ref 4.22–5.81)
RDW: 12.9 % (ref 11.5–15.5)
WBC: 10.1 10*3/uL (ref 4.0–10.5)

## 2018-02-27 MED ORDER — CLOPIDOGREL BISULFATE 75 MG PO TABS: 75.0000 mg | ORAL_TABLET | Freq: Every day | ORAL | 6 refills | Status: DC

## 2018-02-27 MED ORDER — METOPROLOL TARTRATE 25 MG PO TABS: 12.5000 mg | ORAL_TABLET | Freq: Two times a day (BID) | ORAL | 6 refills | Status: DC

## 2018-02-27 MED ORDER — ATORVASTATIN CALCIUM 80 MG PO TABS: 80.0000 mg | ORAL_TABLET | Freq: Every day | ORAL | 11 refills | Status: DC

## 2018-02-27 MED FILL — ATORVASTATIN CALCIUM 80 MG: 80 | 30 days supply | Qty: 30 | Fill #0 | Status: TO

## 2018-02-27 MED FILL — METOPROLOL TARTRATE 25 MG T: 25 | 30 days supply | Qty: 30 | Fill #0 | Status: TO

## 2018-02-27 MED FILL — CLOPIDOGREL 75 MG TABLET: 75 | 30 days supply | Qty: 30 | Fill #0 | Status: TO

## 2018-02-27 NOTE — Progress Notes (Signed)
Pt ambulated in hall one full lap (370 ft.) O2 sat remained 100% and HR remained in the 70s-80s while walking. Pt tolerated well, had no complaints of pain, and expressed desire to walk after lunch.

## 2018-02-27 NOTE — Telephone Encounter (Signed)
New message    TOC appt made per Vin for 12.16.19 with [name] at 11:00am.

## 2018-02-27 NOTE — Progress Notes (Signed)
   The patient was interviewed and examined.  Procedure access site is unremarkable.  Rales present on lung exam yesterday have resolved.  No desaturation with ambulation.  Monitor reveals heart rates above 60 bpm.  Blood pressure is a little high.  Kidney function and hemoglobin are stable.  No bleeding on dual antiplatelet therapy.  Ready for discharge.  Aspirin/apixaban/clopidogrel for 1 week then drop aspirin.  Continue diltiazem and low-dose beta-blocker.  As an outpatient, not a we know the patient has coronary disease increasing the beta-blocker dose and discontinuing diltiazem should be a consideration.  Needs 1 week TOC.

## 2018-02-27 NOTE — Progress Notes (Signed)
Pt ambulated 369ft in hallway on room air and maintained oxygen saturation of 99%

## 2018-02-27 NOTE — Progress Notes (Signed)
Patient discharged per orders. Patient's wife at the bedside for d/c teaching/instructions. Medications given to patient by pharmacy. Follow up appointments, home/self care, activity/diet discussed. When to call MD discussed. Patient and wife verbalized understanding. Time allowed for questions/concerns. Patient and wife stated they had none. Patient left the unit via wheelchair and transported to the main entrance. Roselyn Reef Anis Cinelli,RN

## 2018-02-28 NOTE — Telephone Encounter (Signed)
Called patient, advised to call back to discuss follow up appointment.  Left call back number

## 2018-02-28 NOTE — Telephone Encounter (Signed)
Patient contacted regarding discharge from [facility] on 02/28/18.  Patient understands to follow up with provider [name] on 02/28/18 at 11:00 at [facility]. Patient understands discharge instructions? Yes Patient understands medications and regiment? Yes Patient understands to bring all medications to this visit? Yes

## 2018-03-02 NOTE — Telephone Encounter (Signed)
Noted  

## 2018-03-02 NOTE — Progress Notes (Signed)
Cardiology Office Note   Date:  03/03/2018   ID:  [name], DOB [birth year], MRN [id number]  PCP:  Glenda Chroman, MD  Cardiologist: Dr. [name] Chief Complaint  Patient presents with  . Coronary Artery Disease    TOC Follow up, s.p DES  to RCA     History of Present Illness: [name] is a 72 y.o. male who presents for TOC follow-up after being seen in [place] for chest pain.  The patient was recommended for cardiac catheterization due to symptoms of exertional upper chest discomfort while walking his dog with associated diaphoresis.  Other history includes atrial fibrillation, anxiety, testicular cancer, hypercholesterolemia, hyperlipidemia, hypertension, and GERD.  The patient underwent left heart cath on 02/24/2018 and was found to have 95% stenosis of his RCA and 100% stenosis of a post arterial vessel.  His cath was complicated by right coronary artery dissection which was managed by 4 overlapping stents to his RCA.  The patient required Angiomax for 4 hours post cath and then was placed on dual antiplatelet therapy with aspirin and Plavix recommended for 12 months.    He was noted to have a brief episode of bradycardia in the 40s and felt dizzy after increasing metoprolol to 25 mg twice daily, therefore was reduced to 12.5 mg twice daily.  He was also continued on Cardizem CD 180 mg daily.  He was to check his heart rate at home.  Plan was to discontinue Cardizem and uptitrate metoprolol as outpatient depending upon his response to medication.  He was to continue triple therapy with aspirin Plavix and Eliquis, and stop aspirin in 1 month.  He is an anxious man with multiple questions about his cardiac cath. He is doing well since discharge and has not had recurrent severe symptoms. Some occasional "twinges" on occasion. He is walking around his kitchen table for exercise and will eventually go outside to walk. He is tolerating the medications as directed.    Past Medical  History:  Diagnosis Date  . Anxiety   . Atrial fibrillation ([place])   . Cancer ([facility])    testicular   . Chest pain    myoview 07/09/12-normal, nl ef; echo 04/12/05-ef>55%, mild aortic sclerosis  . GERD (gastroesophageal reflux disease)   . H. pylori infection   . Headache   . Hypercholesteremia   . Hyperlipidemia   . Hypertension   . Hyponatremia 2013  . Palpitations    event monior 04/11/05- SR with occ PAC  . Personal history of colonic polyps 2 9 2007    Past Surgical History:  Procedure Laterality Date  . BACK SURGERY    . CATARACT EXTRACTION W/PHACO Left 12/08/2012   Procedure: CATARACT EXTRACTION PHACO AND INTRAOCULAR LENS PLACEMENT (IOC);  Surgeon: Tonny Branch, MD;  Location: [facility];  Service: Ophthalmology;  Laterality: Left;  CDE:  23.19  . CATARACT EXTRACTION W/PHACO Right 04/15/2014   Procedure: CATARACT EXTRACTION PHACO AND INTRAOCULAR LENS PLACEMENT RIGHT;  Surgeon: Tonny Branch, MD;  Location: [facility];  Service: Ophthalmology;  Laterality: Right;  CDE:10.32  . COLONOSCOPY  12/06/2010   Procedure: COLONOSCOPY;  Surgeon: Rogene Houston, MD;  Location: [facility];  Service: Endoscopy;  Laterality: N/A;  . COLONOSCOPY N/A 05/23/2016   Procedure: COLONOSCOPY;  Surgeon: Rogene Houston, MD;  [facility];  Service: Endoscopy;  Laterality: N/A;  930  . CORONARY STENT INTERVENTION N/A 02/24/2018   Procedure: CORONARY STENT INTERVENTION;  Surgeon: Lorretta Harp, MD;  Location:  [place] [facility];  Service: Cardiovascular;  Laterality: N/A;  . ESOPHAGOGASTRODUODENOSCOPY N/A 07/20/2015   Procedure: ESOPHAGOGASTRODUODENOSCOPY (EGD);  Surgeon: Rogene Houston, MD;  [facility];  Service: Endoscopy;  Laterality: N/A;  2:55 - moved to 1:55 - Ann notified pt  . EYE SURGERY    . LEFT HEART CATH AND CORONARY ANGIOGRAPHY N/A 02/24/2018   Procedure: LEFT HEART CATH AND CORONARY ANGIOGRAPHY;  Surgeon: Lorretta Harp, MD;  Location: [place] CV [facility];  Service:  Cardiovascular;  Laterality: N/A;  . LUMBAR DISC SURGERY Left 2007   "L4-5 ruptured discs"  . POLYPECTOMY  05/23/2016   Procedure: POLYPECTOMY;  Surgeon: Rogene Houston, MD;  [facility];  Service: Endoscopy;;  colon     Current Outpatient Medications  Medication Sig Dispense Refill  . acetaminophen (TYLENOL) 500 MG tablet Take 250 mg by mouth 2 (two) times daily as needed for moderate pain or headache.    Marland Kitchen apixaban (ELIQUIS) 5 MG TABS tablet Take 1 tablet (5 mg total) by mouth 2 (two) times daily. 60 tablet 2  . aspirin EC 81 MG tablet Take 1 tablet (81 mg total) by mouth daily.    Marland Kitchen atorvastatin (LIPITOR) 80 MG tablet Take 1 tablet (80 mg total) by mouth at bedtime. 30 tablet 11  . clopidogrel (PLAVIX) 75 MG tablet Take 1 tablet (75 mg total) by mouth daily with breakfast. 30 tablet 6  . diltiazem (CARDIZEM CD) 120 MG 24 hr capsule Take 1 capsule (120 mg total) by mouth daily. 30 capsule 12  . famotidine (PEPCID) 20 MG tablet Take 1 tablet (20 mg total) by mouth at bedtime. (Patient taking differently: Take 10 mg by mouth at bedtime. )    . isosorbide mononitrate (IMDUR) 30 MG 24 hr tablet Take 1 tablet (30 mg total) by mouth daily. 30 tablet 1  . metoprolol tartrate (LOPRESSOR) 25 MG tablet Take 1 tablet (25 mg total) by mouth 2 (two) times daily. 30 tablet 6  . pantoprazole (PROTONIX) 40 MG tablet Take 40 mg by mouth daily.      No current facility-administered medications for this visit.     Allergies:   Lisinopril; Chlorthalidone; Flexeril [cyclobenzaprine]; Hyoscyamine; Prevacid [lansoprazole]; Valium [diazepam]; and Potassium-containing compounds    Social History:  The patient  reports that he has never smoked. He has never used smokeless tobacco. He reports that he does not drink alcohol or use drugs.   Family History:  The patient's family history includes CAD in his father; Hyperlipidemia in his father; Hypertension in his father.    ROS: All other systems are  reviewed and negative. Unless otherwise mentioned in H&P    PHYSICAL EXAM: VS:  BP (!) 152/68   Pulse (!) 55   Ht 5\' 8"  (1.727 m)   Wt 146 lb 12.8 oz (66.6 kg)   BMI 22.32 kg/m  , BMI Body mass index is 22.32 kg/m. GEN: Well nourished, well developed, in no acute distress HEENT: normal Neck: no JVD, carotid bruits, or masses Cardiac: RRR; no murmurs, rubs, or gallops,no edema  Respiratory:  Clear to auscultation bilaterally, normal work of breathing GI: soft, nontender, nondistended, + BS MS: no deformity or atrophy.Right wrist cath insertion site is well healed.  Skin: warm and dry, no rash Neuro:  Strength and sensation are intact Psych: euthymic mood, full affect   EKG:  Sinus bradycardia rate of 55 bpm.   Recent Labs: 08/27/2017: ALT 21 02/27/2018: BUN 12; Creatinine, Ser  1.00; Hemoglobin 12.8; Platelets 284; Potassium 5.3; Sodium 138    Lipid Panel    Component Value Date/Time   CHOL 174 08/27/2017 0859   TRIG 150 (H) 08/27/2017 0859   HDL 51 08/27/2017 0859   CHOLHDL 3.4 08/27/2017 0859   CHOLHDL 3.7 12/20/2014 0947   VLDL 20 12/20/2014 0947   LDLCALC 93 08/27/2017 0859      Wt Readings from Last 3 Encounters:  03/03/18 146 lb 12.8 oz (66.6 kg)  02/24/18 150 lb (68 kg)  02/19/18 146 lb (66.2 kg)      Other studies Reviewed: Echo 02/25/18 Study Conclusions  - Left ventricle: The cavity size was normal. Systolic function was normal. The estimated ejection fraction was in the range of 55% to 60%. Hypokinesis of the basal-midinferolateral myocardium; consistent with ischemia or infarction in the distribution of the right coronary or left circumflex coronary artery. Left ventricular diastolic function parameters were normal. - Aortic valve: Valve area (Vmax): 2.45 cm^2. - Mitral valve: There was mild regurgitation.  CORONARY STENT INTERVENTION  02/26/18  LEFT HEART CATH AND CORONARY ANGIOGRAPHY  Conclusion     Mid RCA lesion is 95%  stenosed.  A stent was successfully placed.  Post intervention, there is a 0% residual stenosis.  Post Atrio lesion is 100% stenosed.  The left ventricular systolic function is normal.  LV end diastolic pressure is normal.  The left ventricular ejection fraction is 55-65% by visual estimate.    IMPRESSION:PCI and drug-eluting stenting of the entire dominant RCA using overlapping synergy drug-eluting stents in the setting of accelerated angina with coronary dissection possibly related to the guide liner. I placed 4 overlapping synergy stents. There was TIMI-3 flow down to the PDA however there was TIMI I flow in the PLA system. Patient remained hemodynamically stable throughout the procedure. He did receive 200 mcg of intracoronary nitroglycerin and was placed on IV nitroglycerin drip. Angiomax will continue full dose for 4 hours. The sheath was removed and a TR band was placed on the right wrist to achieve patent hemostasis. The patient left the lab in stable condition. He will need relook angiography in 2 days. He will also need uninterrupted dual antiplatelet therapy for at least 12 months and possibly indefinitely given the extensive nature of his stenting.   ASSESSMENT AND PLAN:  1.  CAD: He has had PCI with stenting of RCA with 4 overlapping stents on 12.11/2017. He had RCA dissection during procedure. He is now on Plavix, ASA, and Eliquis. On January 9,2020 he will stop the ASA.   I will increase metoprolol to 25 mg BID and reduced diltiazem to 120 mg daily. He will come back in one month for close follow up. I will consider going down on the isosorbide eventually due to antianginal properties of BB.   I have answered multiple questions and reviewed the cardiac cath illustration with the patient and his wife. I have given them a copy of the report.   2. Atrial Fib: He is rate controlled on both the BB and CCB. Adjustments as above. He will stop ASA on 03/27/2018. He is given  samples of Eliquis as he is in the doughnut hole.   3. Hypercholesterolemia: Continue high dose atorvastatin.    Current medicines are reviewed at length with the patient today.    Labs/ tests ordered today include: None   Phill Myron. West Pugh, ANP, AACC   03/03/2018 11:41 AM    [facility] [place]  [address] [phone] Fax [phone]

## 2018-03-03 ENCOUNTER — Ambulatory Visit: Payer: Medicare Other | Admitting: Adult Health

## 2018-03-03 ENCOUNTER — Encounter (INDEPENDENT_AMBULATORY_CARE_PROVIDER_SITE_OTHER): Payer: Self-pay

## 2018-03-03 ENCOUNTER — Encounter: Payer: Self-pay | Admitting: Adult Health

## 2018-03-03 VITALS — BP 152/68 | HR 55 | Ht 68.0 in | Wt 146.8 lb

## 2018-03-03 DIAGNOSIS — I10 Essential (primary) hypertension: Secondary | ICD-10-CM | POA: Diagnosis not present

## 2018-03-03 DIAGNOSIS — I480 Paroxysmal atrial fibrillation: Secondary | ICD-10-CM

## 2018-03-03 DIAGNOSIS — Z9889 Other specified postprocedural states: Secondary | ICD-10-CM

## 2018-03-03 DIAGNOSIS — I2510 Atherosclerotic heart disease of native coronary artery without angina pectoris: Secondary | ICD-10-CM

## 2018-03-03 DIAGNOSIS — E7800 Pure hypercholesterolemia, unspecified: Secondary | ICD-10-CM

## 2018-03-03 MED ORDER — METOPROLOL TARTRATE 25 MG PO TABS: 25.0000 mg | ORAL_TABLET | Freq: Two times a day (BID) | ORAL | 6 refills | Status: DC

## 2018-03-03 MED ORDER — DILTIAZEM HCL ER COATED BEADS 120 MG PO CP24: 120.0000 mg | ORAL_CAPSULE | Freq: Every day | ORAL | 12 refills | Status: DC

## 2018-03-03 NOTE — Patient Instructions (Addendum)
Medication Instructions:  INCREASE METOPROLOL 25MG  TWICE DAILY  DECREASE DILTIAZEM 120MG  DAILY  If you need a refill on your cardiac medications before your next appointment, please call your pharmacy.  Labwork: If you have labs (blood work) drawn today and your tests are completely normal, you will receive your results only by [place] (if you have MyChart) -OR- A paper copy in the mail.  If you have any lab test that is abnormal or we need to change your treatment, we will call you to review these results.  Special Instructions: STOP ASPIRIN 03-27-2018  Follow-Up: You will need a follow up appointment in 1 months.  Please call our office 2 months in advance to schedule this appointment.  You may see Quay Burow, MD -[place], PA-C.    At [facility], you and your health needs are our priority.  As part of our continuing mission to provide you with exceptional heart care, we have created designated Provider Care Teams.  These Care Teams include your primary Cardiologist (physician) and Advanced Practice Providers (APPs -  Physician Assistants and Nurse Practitioners) who all work together to provide you with the care you need, when you need it.

## 2018-03-04 ENCOUNTER — Telehealth: Payer: Self-pay | Admitting: Cardiovascular Disease

## 2018-03-04 NOTE — Telephone Encounter (Signed)
Spoke with patient and he stated this am he had burning in center of chest that improved with eating. He has not taken his Pepcid or Protonix in several days. He does see GI for GERD. Patient denies and radiating pain, nausea, diaphoresis, or shortness of breath. Per patient pain not the same as prior to stent. Patient will resume his Protonix and Pepcid as prescribed. Will forward to [place] for review

## 2018-03-04 NOTE — Telephone Encounter (Signed)
Patient states he had a bad case of heartburn today. He hasn't been taking his pantoprazole (PROTONIX) 40 MG tablet or  famotidine (PEPCID) 20 MG tablet for about a week.  He states he had something to eat and the pain eased up, he said he will take the protonix and pepcid when he gets home. He is wondering if not taking the above medications for a week can cause that.

## 2018-03-04 NOTE — Telephone Encounter (Signed)
Agree with restarting PPI.

## 2018-03-04 NOTE — Telephone Encounter (Addendum)
Advised patient, verbalized understanding. Advised to call back if symptoms like prior to stent or go to ED

## 2018-03-05 ENCOUNTER — Telehealth: Payer: Self-pay | Admitting: Cardiovascular Disease

## 2018-03-05 NOTE — Telephone Encounter (Signed)
Returned call to patient he stated he saw [name] DNP this past Monday and medication was changed.Stated his heart rate is 49 to 50.Stated his chest feels better.Advised 49 to 50 heart rate is ok.Advised to continue medications as prescribed.Advised if heart rate gets lower to call back.Advised to keep appointment as planned with Dr.Berry 04/15/18 at 9:30 am.

## 2018-03-05 NOTE — Telephone Encounter (Signed)
New Message   Pt's wife is calling, states the pt is still having the same symptoms and would like for the nurse to call back again. Please call

## 2018-03-06 ENCOUNTER — Telehealth: Payer: Self-pay | Admitting: Student

## 2018-03-06 ENCOUNTER — Other Ambulatory Visit: Payer: Self-pay

## 2018-03-06 ENCOUNTER — Emergency Department (HOSPITAL_COMMUNITY): Payer: Medicare Other

## 2018-03-06 ENCOUNTER — Emergency Department (HOSPITAL_COMMUNITY)
Admission: EM | Admit: 2018-03-06 | Discharge: 2018-03-06 | Disposition: A | Discharge status: Home / Self Care | Payer: Medicare Other | Attending: Emergency Medicine | Admitting: Emergency Medicine

## 2018-03-06 ENCOUNTER — Encounter (HOSPITAL_COMMUNITY): Payer: Self-pay | Admitting: *Deleted

## 2018-03-06 DIAGNOSIS — R0789 Other chest pain: Secondary | ICD-10-CM | POA: Diagnosis not present

## 2018-03-06 DIAGNOSIS — Z7982 Long term (current) use of aspirin: Secondary | ICD-10-CM | POA: Diagnosis not present

## 2018-03-06 DIAGNOSIS — Z7902 Long term (current) use of antithrombotics/antiplatelets: Secondary | ICD-10-CM | POA: Diagnosis not present

## 2018-03-06 DIAGNOSIS — Z79899 Other long term (current) drug therapy: Secondary | ICD-10-CM | POA: Insufficient documentation

## 2018-03-06 DIAGNOSIS — Z7901 Long term (current) use of anticoagulants: Secondary | ICD-10-CM | POA: Insufficient documentation

## 2018-03-06 DIAGNOSIS — I10 Essential (primary) hypertension: Secondary | ICD-10-CM | POA: Diagnosis not present

## 2018-03-06 DIAGNOSIS — R079 Chest pain, unspecified: Secondary | ICD-10-CM | POA: Diagnosis present

## 2018-03-06 DIAGNOSIS — I4891 Unspecified atrial fibrillation: Secondary | ICD-10-CM | POA: Diagnosis not present

## 2018-03-06 DIAGNOSIS — I2510 Atherosclerotic heart disease of native coronary artery without angina pectoris: Secondary | ICD-10-CM | POA: Insufficient documentation

## 2018-03-06 LAB — BASIC METABOLIC PANEL
ANION GAP: 7 (ref 5–15)
BUN: 9 mg/dL (ref 8–23)
CO2: 24 mmol/L (ref 22–32)
Calcium: 8.8 mg/dL — ABNORMAL LOW (ref 8.9–10.3)
Chloride: 100 mmol/L (ref 98–111)
Creatinine, Ser: 0.85 mg/dL (ref 0.61–1.24)
GFR calc Af Amer: >60 mL/min (ref >60)
GFR calc non Af Amer: >60 mL/min (ref >60)
Glucose, Bld: 129 mg/dL — ABNORMAL HIGH (ref 70–99)
Potassium: 4.3 mmol/L (ref 3.5–5.1)
Sodium: 131 mmol/L — ABNORMAL LOW (ref 135–145)

## 2018-03-06 LAB — CBC
HCT: 36.7 % — ABNORMAL LOW (ref 39.0–52.0)
Hemoglobin: 11.7 g/dL — ABNORMAL LOW (ref 13.0–17.0)
MCH: 28.5 pg (ref 26.0–34.0)
MCHC: 31.9 g/dL (ref 30.0–36.0)
MCV: 89.3 fL (ref 80.0–100.0)
Platelets: 302 10*3/uL (ref 150–400)
RBC: 4.11 MIL/uL — ABNORMAL LOW (ref 4.22–5.81)
RDW: 12.7 % (ref 11.5–15.5)
WBC: 7.6 10*3/uL (ref 4.0–10.5)
nRBC: 0 % (ref 0.0–0.2)

## 2018-03-06 LAB — TROPONIN I: Troponin I: 0.03 ng/mL (ref <0.03)

## 2018-03-06 MED ORDER — SUCRALFATE 1 GM/10ML PO SUSP
1.0000 g | Freq: Three times a day (TID) | ORAL | Status: DC
  Administered 2018-03-06: 1 g via ORAL
  Filled 2018-03-06: qty 10

## 2018-03-06 MED ORDER — FAMOTIDINE IN NACL 20-0.9 MG/50ML-% IV SOLN: 20.0000 mg | Freq: Once | INTRAVENOUS | Status: DC

## 2018-03-06 NOTE — ED Provider Notes (Signed)
[facility] EMERGENCY DEPARTMENT Provider Note   CSN: [id number] Arrival date & time: 03/06/18  2012     History   Chief Complaint Chief Complaint  Patient presents with  . Chest Pain    HPI [name] is a 72 y.o. male.  HPI Patient with multiple medical issues including recent placement of coronary stents presents with chest pain. Notably, patient also has history of anxiety, and GERD. During his recent illness he had change in his regimen of antacid medications. He notes that after the procedure he has had burning in his chest, midline, sternal, radiating in a superior fashion, without new dyspnea, without new pleuritic or exertional pain. Pain is somewhat better with different medications, though inconsistently so. No new fever, chills per Patient is here with his wife who assists with the HPI. Patient's coronary interventions actually occurred after he was referred to cardiology following evaluation here, with follow-up there and at GI. Notes that he has been compliant with his Plavix, and other medications, though only recently been taking PPI therapy again.  Past Medical History:  Diagnosis Date  . Anxiety   . Atrial fibrillation ([place])   . Cancer ([place])    testicular   . Chest pain    myoview 07/09/12-normal, nl ef; echo 04/12/05-ef>55%, mild aortic sclerosis  . GERD (gastroesophageal reflux disease)   . H. pylori infection   . Headache   . Hypercholesteremia   . Hyperlipidemia   . Hypertension   . Hyponatremia 2013  . Palpitations    event monior 04/11/05- SR with occ PAC  . Personal history of colonic polyps 2 9 2007    Patient Active Problem List   Diagnosis Date Noted  . Non-ST elevation (NSTEMI) myocardial infarction ([place])   . 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   . CAD (coronary artery disease) 02/24/2018  . Family history of coronary artery disease in father 02/19/2018  . Chronic anticoagulation  02/18/2018  . PAF (paroxysmal atrial fibrillation) ([place]) 03/13/2016  . Hx of colonic polyps 12/15/2015  . Palpitations 02/25/2015  . Lumbago 01/01/2013  . Unstable angina ([place]) 08/08/2012  . Essential hypertension 08/08/2012  . Diarrhea 10/23/2010  . Epigastric pain 10/23/2010  . Anxiety   . Dyslipidemia   . GERD (gastroesophageal reflux disease)   . COLONIC POLYPS 11/23/2008  . DYSPEPSIA, CHRONIC 11/23/2008  . IBS 11/23/2008  . LOSS OF APPETITE 11/23/2008  . EPIGASTRIC PAIN 11/23/2008  . HEARTBURN, HX OF 11/23/2008    Past Surgical History:  Procedure Laterality Date  . BACK SURGERY    . CATARACT EXTRACTION W/PHACO Left 12/08/2012   Procedure: CATARACT EXTRACTION PHACO AND INTRAOCULAR LENS PLACEMENT (IOC);  Surgeon: Tonny Branch, MD;  Location: [facility];  Service: Ophthalmology;  Laterality: Left;  CDE:  23.19  . CATARACT EXTRACTION W/PHACO Right 04/15/2014   Procedure: CATARACT EXTRACTION PHACO AND INTRAOCULAR LENS PLACEMENT RIGHT;  Surgeon: Tonny Branch, MD;  Location: [facility];  Service: Ophthalmology;  Laterality: Right;  CDE:10.32  . COLONOSCOPY  12/06/2010   Procedure: COLONOSCOPY;  Surgeon: Rogene Houston, MD;  [facility];  Service: Endoscopy;  Laterality: N/A;  . COLONOSCOPY N/A 05/23/2016   Procedure: COLONOSCOPY;  Surgeon: Rogene Houston, MD;  [facility];  Service: Endoscopy;  Laterality: N/A;  930  . CORONARY STENT INTERVENTION N/A 02/24/2018   Procedure: CORONARY STENT INTERVENTION;  Surgeon: Lorretta Harp, MD;  Location: [place] CV LAB;  Service: Cardiovascular;  Laterality: N/A;  .  ESOPHAGOGASTRODUODENOSCOPY N/A 07/20/2015   Procedure: ESOPHAGOGASTRODUODENOSCOPY (EGD);  Surgeon: Rogene Houston, MD;  [facility];  Service: Endoscopy;  Laterality: N/A;  2:55 - moved to 1:55 - Ann notified pt  . EYE SURGERY    . LEFT HEART CATH AND CORONARY ANGIOGRAPHY N/A 02/24/2018   Procedure: LEFT HEART CATH AND CORONARY ANGIOGRAPHY;  Surgeon:  Lorretta Harp, MD;  Location: [place] CV LAB;  Service: Cardiovascular;  Laterality: N/A;  . LUMBAR DISC SURGERY Left 2007   "L4-5 ruptured discs"  . POLYPECTOMY  05/23/2016   Procedure: POLYPECTOMY;  Surgeon: Rogene Houston, MD;  [facility];  Service: Endoscopy;;  colon        Home Medications    Prior to Admission medications   Medication Sig Start Date End Date Taking? Authorizing Provider  acetaminophen (TYLENOL) 500 MG tablet Take 250 mg by mouth 2 (two) times daily as needed for moderate pain or headache.   Yes [provider]  apixaban (ELIQUIS) 5 MG TABS tablet Take 1 tablet (5 mg total) by mouth 2 (two) times daily. 04/01/17  Yes Lorretta Harp, MD  aspirin EC 81 MG tablet Take 1 tablet (81 mg total) by mouth daily. 02/19/18  Yes Kilroy, Luke K, PA-C  atorvastatin (LIPITOR) 80 MG tablet Take 1 tablet (80 mg total) by mouth at bedtime. 02/27/18  Yes Bhagat, Bhavinkumar, PA  clopidogrel (PLAVIX) 75 MG tablet Take 1 tablet (75 mg total) by mouth daily with breakfast. 02/28/18  Yes Bhagat, Bhavinkumar, PA  diltiazem (CARDIZEM CD) 120 MG 24 hr capsule Take 1 capsule (120 mg total) by mouth daily. 03/03/18  Yes Lendon Colonel, NP  famotidine (PEPCID) 20 MG tablet Take 1 tablet (20 mg total) by mouth at bedtime. Patient taking differently: Take 10 mg by mouth at bedtime.  02/04/18  Yes Rehman, Mechele Dawley, MD  isosorbide mononitrate (IMDUR) 30 MG 24 hr tablet Take 1 tablet (30 mg total) by mouth daily. 02/19/18 05/20/18 Yes Kilroy, Luke K, PA-C  metoprolol tartrate (LOPRESSOR) 25 MG tablet Take 1 tablet (25 mg total) by mouth 2 (two) times daily. 03/03/18  Yes Lendon Colonel, NP  pantoprazole (PROTONIX) 40 MG tablet Take 40 mg by mouth every morning.    Yes [provider]    Family History Family History  Problem Relation Age of Onset  . Hypertension Father   . Hyperlipidemia Father   . CAD Father        H/O CABG    Social  History Social History   Tobacco Use  . Smoking status: Never Smoker  . Smokeless tobacco: Never Used  Substance Use Topics  . Alcohol use: No  . Drug use: No     Allergies   Lisinopril; Chlorthalidone; Flexeril [cyclobenzaprine]; Hyoscyamine; Prevacid [lansoprazole]; Valium [diazepam]; and Potassium-containing compounds   Review of Systems Review of Systems  Constitutional:       Per HPI, otherwise negative  HENT:       Per HPI, otherwise negative  Respiratory:       Per HPI, otherwise negative  Cardiovascular:       Per HPI, otherwise negative  Gastrointestinal: Negative for vomiting.  Endocrine:       Negative aside from HPI  Genitourinary:       Neg aside from HPI   Musculoskeletal:       Per HPI, otherwise negative  Skin: Negative.   Neurological: Negative for syncope.  Psychiatric/Behavioral: The patient is nervous/anxious.  Physical Exam Updated Vital Signs BP (!) 153/92   Pulse 62   Temp 97.9 F (36.6 C) (Oral)   Resp 20   Ht 5\' 8"  (1.727 m)   Wt 67.1 kg   SpO2 97%   BMI 22.50 kg/m   Physical Exam Vitals signs and nursing note reviewed.  Constitutional:      General: He is not in acute distress.    Appearance: He is well-developed.  HENT:     Head: Normocephalic and atraumatic.  Eyes:     Conjunctiva/sclera: Conjunctivae normal.  Cardiovascular:     Rate and Rhythm: Normal rate and regular rhythm.  Pulmonary:     Effort: Pulmonary effort is normal. No respiratory distress.     Breath sounds: No stridor.  Abdominal:     General: There is no distension.  Skin:    General: Skin is warm and dry.  Neurological:     Mental Status: He is alert and oriented to person, place, and time.      ED Treatments / Results  Labs (all labs ordered are listed, but only abnormal results are displayed) Labs Reviewed  BASIC METABOLIC PANEL - Abnormal; Notable for the following components:      Result Value   Sodium 131 (*)    Glucose, Bld 129  (*)    Calcium 8.8 (*)    All other components within normal limits  CBC - Abnormal; Notable for the following components:   RBC 4.11 (*)    Hemoglobin 11.7 (*)    HCT 36.7 (*)    All other components within normal limits  TROPONIN I - Abnormal; Notable for the following components:   Troponin I 0.03 (*)    All other components within normal limits    EKG Sinus rhythm, rate 75, wander, otherwise unremarkable EKG.  Radiology Dg Chest 2 View  Result Date: 03/06/2018 CLINICAL DATA:  Chest pain EXAM: CHEST - 2 VIEW COMPARISON:  01/24/2018 FINDINGS: Heart size normal. Right coronary stents. Negative for heart failure. Lungs are clear without infiltrate effusion or mass. Chronic right rib fractures IMPRESSION: No active cardiopulmonary disease. Electronically Signed   By: Franchot Gallo M.D.   On: 03/06/2018 20:48    Procedures Procedures (including critical care time)  Medications Ordered in ED Medications  famotidine (PEPCID) IVPB 20 mg premix (20 mg Intravenous Refused 03/06/18 2231)  sucralfate (CARAFATE) 1 GM/10ML suspension 1 g (1 g Oral Given 03/06/18 2209)     Initial Impression / Assessment and Plan / ED Course  I have reviewed the triage vital signs and the nursing notes.  Pertinent labs & imaging results that were available during my care of the patient were reviewed by me and considered in my medical decision making (see chart for details).    Chart review performed after the initial evaluation notable for recent hospitalization, placement of multiple coronary stents Patient also has multiple recent GI visits.  On repeat exam after the patient has received Carafate, he states that he feels somewhat better, continues to deny other complaints That vitals unremarkable, labs consistent with recent stenting, with troponin minimally positive, notably decreased from recent positive value, consistent with appropriate decline With a nonischemic EKG, improvement with GI  cocktail, and his history of GERD, low suspicion for ACS. If the patient had occlusion of any of his stents, EKG would likely be abnormal, and or he would have more substantial chest pain. Patient encouraged to follow-up with outpatient providers, return to appropriate therapy for his  GERD.  Final Clinical Impressions(s) / ED Diagnoses   Final diagnoses:  Atypical chest pain     Carmin Muskrat, MD 03/07/18 0001

## 2018-03-06 NOTE — ED Notes (Signed)
edp at bedside  

## 2018-03-06 NOTE — ED Notes (Signed)
Pt using urinal.

## 2018-03-06 NOTE — ED Notes (Signed)
Date and time results received: 03/06/18 9:24 PM  (use smartphrase ".now" to insert current time)  Test: trop Critical Value: 0.03  Name of Provider Notified: [place]  Orders Received? Or Actions Taken?:  N/a

## 2018-03-06 NOTE — Discharge Instructions (Signed)
As discussed, your evaluation today has been largely reassuring.  But, it is important that you monitor your condition carefully, and do not hesitate to return to the ED if you develop new, or concerning changes in your condition. ? ?Otherwise, please follow-up with your physician for appropriate ongoing care. ? ?

## 2018-03-06 NOTE — ED Triage Notes (Signed)
Pt c/o sensation in mid chest area that pt describes as "acid reflux" that started yesterday, had 4 stents placed last week at [facility], denies any n/v, sob, denies any radiation, pt states that he took his evening dose of pepcid and the pain has "eased up" since then

## 2018-03-06 NOTE — Telephone Encounter (Signed)
   Received page from answering service about patient having "chest burning." Called and spoke with patient. Patient had recent cardiac catheterization with 4 overlapping stents to the RCA on 02/24/2018. Patient reports intermittent "burning" in chest that radiates up his "throat" over the last 2 days. Patient denies any associated diaphoresis, shortness of breath, or nausea/vomiting. He has a history of GERD and restarted taking Pepcid a couple of days ago. Today, the chest burning has been more persistent lasting most of the day without significant improvement with Protonix or Pepcid. Chest discomfort may be GERD; however, given recent PCI and persistent symptoms over the last couple of days, recommended patient come to the ED. Patient voiced understanding and said he would go to the [facility] ED.  Darreld Mclean, PA-C 03/06/2018 7:55 PM

## 2018-03-17 ENCOUNTER — Telehealth: Payer: Self-pay

## 2018-03-17 ENCOUNTER — Telehealth: Payer: Self-pay | Admitting: Cardiovascular Disease

## 2018-03-17 NOTE — Telephone Encounter (Signed)
Thank you :)

## 2018-03-17 NOTE — Telephone Encounter (Signed)
New Message   Pt c/o Shortness Of Breath: STAT if SOB developed within the last 24 hours or pt is noticeably SOB on the phone  1. Are you currently SOB (can you hear that pt is SOB on the phone)? No, did not hear sob on the phone  2. How long have you been experiencing SOB? On and off  3. Are you SOB when sitting or when up moving around? Moving around   4. Are you currently experiencing any other symptoms? No

## 2018-03-17 NOTE — Telephone Encounter (Signed)
Spoke with pt who states that he has had SOB that "comes and goes" with no increase in severity and has been having SOB "on and off" since his procedure on 12/9; last episodes were last night and the day before. Pt states that his episodes of SOB last longer when he is nervous. Pt also states that he has felt SOB at times when walking around his home and when eating and that someone spoke to him about his Protonix during his ED visit on 12/19. Per pt, last HR 50, BP 164/76 and 162/80 and states that he just took his BP meds. Pt states that he feels fine this morning with no c/o SOB. Advised pt to set up appt and pt requested to see [name], NP; appt set up for 03/27/2018 at 830am. Pt advised to continue on current meds and free to call back, but should to report to ED if symptoms return and/or increase in severity and/or has chest pain. Pt verbalized understanding

## 2018-03-24 ENCOUNTER — Telehealth (INDEPENDENT_AMBULATORY_CARE_PROVIDER_SITE_OTHER): Payer: Self-pay | Admitting: Internal Medicine

## 2018-03-24 ENCOUNTER — Other Ambulatory Visit (INDEPENDENT_AMBULATORY_CARE_PROVIDER_SITE_OTHER): Payer: Self-pay | Admitting: *Deleted

## 2018-03-24 DIAGNOSIS — R1013 Epigastric pain: Principal | ICD-10-CM

## 2018-03-24 DIAGNOSIS — G8929 Other chronic pain: Secondary | ICD-10-CM

## 2018-03-24 NOTE — Telephone Encounter (Signed)
Patient called stated last time he saw Dr Laural Golden - was told if not getting any better he would proceed with an upper ultra sound - patient wants to go ahead with this

## 2018-03-24 NOTE — Telephone Encounter (Signed)
[name] sch'd 03/28/18 at [phone] ([phone]), npo after midnight, patient aware

## 2018-03-26 NOTE — Progress Notes (Signed)
Cardiology Office Note   Date:  03/27/2018   ID:  [name] January 14, 1946, MRN [id number]  PCP:  Glenda Chroman, MD  Cardiologist: [facility]  Chief Complaint  Patient presents with  . Coronary Artery Disease  . Shortness of Breath     History of Present Illness: [name] is a 73 y.o. male who presents for ongoing assessment and management of CAD with cath on 02/24/2018 revealing 95% stenosis of his RCA and 100% stenosis of a post arterio vessel. His cath was complicated by a RCA dissection managed by 4 overlapping stents to the RCA. He has brief episodes of bradycardia and metoprolol was decreased to 12.5 BID. He was to continue Plavix and Eliquis and to stop ASA in one month. He is also treated for atrial fib and HL. He remained on Eliquis and high dose atorvastatin.   On last office visit, metoprolol was increased to 25 mg BID and diltiazem to 120 mg. He is here for one month follow up but is early because he has concerns about occasional episodes of dyspnea. He states today that those events are becoming more rare now. He states that his breathing status is improved.   He has not yet joined cardiac rehab as his class does not start until February 2020. He walks around his kitchen table for 10 minutes a day. He denies recurrent chest pain or fatigue.  He denies bleeding on Plavix and Eliquis. He has stopped his ASA as directed. He is due for CT of the abdomen by Dr. Laural Golden in [place] tomorrow for evaluation of abdominal discomfort that has been chronic for him..     Past Medical History:  Diagnosis Date  . Anxiety   . Atrial fibrillation ([place])   . 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 Chest pain    myoview 07/09/12-normal, nl ef; echo 04/12/05-ef>55%, mild aortic sclerosis  . GERD (gastroesophageal reflux disease)   . H. pylori infection   . Headache   . Hypercholesteremia   . Hyperlipidemia   . Hypertension   . Hyponatremia 2013  . Palpitations    event monior 04/11/05- SR  with occ PAC  . Personal history of colonic polyps 2 9 2007    Past Surgical History:  Procedure Laterality Date  . BACK SURGERY    . CATARACT EXTRACTION W/PHACO Left 12/08/2012   Procedure: CATARACT EXTRACTION PHACO AND INTRAOCULAR LENS PLACEMENT (IOC);  Surgeon: Tonny Branch, MD;  Location: [facility];  Service: Ophthalmology;  Laterality: Left;  CDE:  23.19  . CATARACT EXTRACTION W/PHACO Right 04/15/2014   Procedure: CATARACT EXTRACTION PHACO AND INTRAOCULAR LENS PLACEMENT RIGHT;  Surgeon: Tonny Branch, MD;  Location: [facility];  Service: Ophthalmology;  Laterality: Right;  CDE:10.32  . COLONOSCOPY  12/06/2010   Procedure: COLONOSCOPY;  Surgeon: Rogene Houston, MD;  Location: [facility];  Service: Endoscopy;  Laterality: N/A;  . COLONOSCOPY N/A 05/23/2016   Procedure: COLONOSCOPY;  Surgeon: Rogene Houston, MD;  Location: [facility];  Service: Endoscopy;  Laterality: N/A;  930  . CORONARY STENT INTERVENTION N/A 02/24/2018   Procedure: CORONARY STENT INTERVENTION;  Surgeon: Lorretta Harp, MD;  Location: [facility];  Service: Cardiovascular;  Laterality: N/A;  . ESOPHAGOGASTRODUODENOSCOPY N/A 07/20/2015   Procedure: ESOPHAGOGASTRODUODENOSCOPY (EGD);  Surgeon: Rogene Houston, MD;  Location: [facility];  Service: Endoscopy;  Laterality: N/A;  2:55 - moved to 1:55 - Ann notified pt  . EYE SURGERY    .  LEFT HEART CATH AND CORONARY ANGIOGRAPHY N/A 02/24/2018   Procedure: LEFT HEART CATH AND CORONARY ANGIOGRAPHY;  Surgeon: Lorretta Harp, MD;  Location: [facility];  Service: Cardiovascular;  Laterality: N/A;  . LUMBAR DISC SURGERY Left 2007   "L4-5 ruptured discs"  . POLYPECTOMY  05/23/2016   Procedure: POLYPECTOMY;  Surgeon: Rogene Houston, MD;  Location: [facility];  Service: Endoscopy;;  colon     Current Outpatient Medications  Medication Sig Dispense Refill  . acetaminophen (TYLENOL) 500 MG tablet Take 250 mg by mouth 2 (two) times daily as needed for moderate pain or  headache.    Marland Kitchen apixaban (ELIQUIS) 5 MG TABS tablet Take 1 tablet (5 mg total) by mouth 2 (two) times daily. 60 tablet 2  . atorvastatin (LIPITOR) 80 MG tablet Take 1 tablet (80 mg total) by mouth at bedtime. 30 tablet 11  . clopidogrel (PLAVIX) 75 MG tablet Take 1 tablet (75 mg total) by mouth daily with breakfast. 30 tablet 6  . diltiazem (CARDIZEM CD) 120 MG 24 hr capsule Take 1 capsule (120 mg total) by mouth daily. 30 capsule 12  . isosorbide mononitrate (IMDUR) 30 MG 24 hr tablet Take 1 tablet (30 mg total) by mouth daily. 30 tablet 1  . aspirin EC 81 MG tablet Take 1 tablet (81 mg total) by mouth daily. (Patient not taking: Reported on 03/27/2018)    . famotidine (PEPCID) 20 MG tablet Take 1 tablet (20 mg total) by mouth at bedtime. (Patient not taking: Reported on 03/27/2018)    . metoprolol tartrate (LOPRESSOR) 25 MG tablet Take 1 tablet (25 mg total) by mouth 2 (two) times daily. 30 tablet 6  . pantoprazole (PROTONIX) 40 MG tablet Take 40 mg by mouth every morning.      No current facility-administered medications for this visit.     Allergies:   Lisinopril; Chlorthalidone; Flexeril [cyclobenzaprine]; Hyoscyamine; Prevacid [lansoprazole]; Valium [diazepam]; and Potassium-containing compounds    Social History:  The patient  reports that he has never smoked. He has never used smokeless tobacco. He reports that he does not drink alcohol or use drugs.   Family History:  The patient's family history includes CAD in his father; Hyperlipidemia in his father; Hypertension in his father.    ROS: All other systems are reviewed and negative. Unless otherwise mentioned in H&P    PHYSICAL EXAM: VS:  BP (!) 144/76   Pulse (!) 59   Ht 5\' 8"  (1.727 m)   Wt 148 lb (67.1 kg)   BMI 22.50 kg/m  , BMI Body mass index is 22.5 kg/m. GEN: Well nourished, well developed, in no acute distress HEENT: normal Neck: no JVD, carotid bruits, or masses Cardiac: RRR; no murmurs, rubs, or gallops,no edema    Respiratory:  Clear to auscultation bilaterally, normal work of breathing GI: soft, nontender, nondistended, + BS MS: no deformity or atrophy Skin: warm and dry, no rash Neuro:  Strength and sensation are intact Psych: euthymic mood, full affect   EKG:  Not completed this office visit.   Recent Labs: 08/27/2017: ALT 21 03/06/2018: BUN 9; Creatinine, Ser 0.85; Hemoglobin 11.7; Platelets 302; Potassium 4.3; Sodium 131    Lipid Panel    Component Value Date/Time   CHOL 174 08/27/2017 0859   TRIG 150 (H) 08/27/2017 0859   HDL 51 08/27/2017 0859   CHOLHDL 3.4 08/27/2017 0859   CHOLHDL 3.7 12/20/2014 0947   VLDL 20 12/20/2014 0947   LDLCALC 93 08/27/2017 0859  Wt Readings from Last 3 Encounters:  03/27/18 148 lb (67.1 kg)  03/06/18 148 lb (67.1 kg)  03/03/18 146 lb 12.8 oz (66.6 kg)      Other studies Reviewed: Cardiac Cath 02/24/2018 Conclusion     Mid RCA lesion is 95% stenosed.  A stent was successfully placed.  Post intervention, there is a 0% residual stenosis.  Post Atrio lesion is 100% stenosed.  The left ventricular systolic function is normal.  LV end diastolic pressure is normal.  The left ventricular ejection fraction is 55-65% by visual estimate.     Echocardiogram 02/25/2018 Left ventricle: The cavity size was normal. Systolic function was   normal. The estimated ejection fraction was in the range of 55%   to 60%. Hypokinesis of the basal-midinferolateral myocardium;   consistent with ischemia or infarction in the distribution of the   right coronary or left circumflex coronary artery. Left   ventricular diastolic function parameters were normal. - Aortic valve: Valve area (Vmax): 2.45 cm^2. - Mitral valve: There was mild regurgitation.  ASSESSMENT AND PLAN:  1. CAD: S/P stent placement to the RCA due to 95% stenosis with post atrio lesion 100% stenosed  His cath was complicated by a RCA dissection managed by 4 overlapping stents to the  RCA.   He has had some minor complaints of shortness of breath which has essentially resolved by this appointment He states he was having some bouts of this with exertion. He otherwise is doing well. I have encouraged him to participate in Cardiac Rehab at [facility] when his class starts in February.  I am refilling isosorbide and Eliquis.  2. Hx of PAF: Currently HR is regular on auscultation. Consider stopping Eliquis. Will defer to Dr. [name] for his recommendations on follow up visit.   3. Hypercholesterolemia:  I will having fasting lipids and LFT's drawn prior to his next appointment with Dr. [name] at the end of the month.   4. Hypertension: BP is improved since medication changes. He brings with him his log from home. I am seeing a downward trend from the 629'U systolic to the high 765'Y and low 140's. I have asked him to continue to record BP at home. May increase CCB if remains elevated. HR in the 50's per record.   Current medicines are reviewed at length with the patient today.    Labs/ tests ordered today include: Lipids and LFT's at [facility].  Phill Myron. [place] Pugh, ANP, [place]   03/27/2018 9:18 AM    [address] [facility] [address] Office [phone] Fax [phone]

## 2018-03-27 ENCOUNTER — Encounter: Payer: Self-pay | Admitting: Adult Health

## 2018-03-27 ENCOUNTER — Ambulatory Visit: Payer: Medicare Other | Admitting: Adult Health

## 2018-03-27 VITALS — BP 144/76 | HR 59 | Ht 68.0 in | Wt 148.0 lb

## 2018-03-27 DIAGNOSIS — I480 Paroxysmal atrial fibrillation: Secondary | ICD-10-CM | POA: Diagnosis not present

## 2018-03-27 DIAGNOSIS — E7800 Pure hypercholesterolemia, unspecified: Secondary | ICD-10-CM

## 2018-03-27 DIAGNOSIS — I10 Essential (primary) hypertension: Secondary | ICD-10-CM

## 2018-03-27 DIAGNOSIS — I2510 Atherosclerotic heart disease of native coronary artery without angina pectoris: Secondary | ICD-10-CM

## 2018-03-27 DIAGNOSIS — Z79899 Other long term (current) drug therapy: Secondary | ICD-10-CM

## 2018-03-27 MED ORDER — ISOSORBIDE MONONITRATE ER 30 MG PO TB24: 30.0000 mg | ORAL_TABLET | Freq: Every day | ORAL | 1 refills | Status: DC

## 2018-03-27 MED ORDER — APIXABAN 5 MG PO TABS: 5.0000 mg | ORAL_TABLET | Freq: Two times a day (BID) | ORAL | 2 refills | Status: DC

## 2018-03-27 NOTE — Patient Instructions (Signed)
Follow-Up: You will need a follow up appointment on 04-15-2018.  You may see Quay Burow, MD or one of the following Advanced Practice Providers on your designated Care Team:  Kerin Ransom, PA-C  Roby Lofts, PA-C  Sande Rives, [place]     Labwork: LFT AND FASTING LIPID PANEL THE [place] 04-15-18 APPT When you have labs (blood work) and your tests are completely normal, you will receive your results ONLY by MyChart Message (if you have MyChart) -OR- A paper copy in the mail.  Medication Instructions:  NO CHANGES- Your physician recommends that you continue on your current medications as directed. Please refer to the Current Medication list given to you today. If you need a refill on your cardiac medications before your next appointment, please call your pharmacy.  At [facility], you and your health needs are our priority.  As part of our continuing mission to provide you with exceptional heart care, we have created designated Provider Care Teams.  These Care Teams include your primary Cardiologist (physician) and Advanced Practice Providers (APPs -  Physician Assistants and Nurse Practitioners) who all work together to provide you with the care you need, when you need it.  Thank you for choosing [facility] at [facility]!!

## 2018-03-28 ENCOUNTER — Ambulatory Visit (HOSPITAL_COMMUNITY)
Admission: RE | Admit: 2018-03-28 | Discharge: 2018-03-28 | Disposition: A | Discharge status: Home / Self Care | Payer: Medicare Other | Source: Ambulatory Visit | Attending: Internal Medicine | Admitting: Internal Medicine

## 2018-03-28 DIAGNOSIS — R1013 Epigastric pain: Secondary | ICD-10-CM | POA: Diagnosis not present

## 2018-03-28 DIAGNOSIS — G8929 Other chronic pain: Secondary | ICD-10-CM | POA: Diagnosis present

## 2018-04-15 ENCOUNTER — Ambulatory Visit: Payer: Medicare Other | Admitting: Cardiovascular Disease

## 2018-04-15 ENCOUNTER — Ambulatory Visit (INDEPENDENT_AMBULATORY_CARE_PROVIDER_SITE_OTHER): Payer: Medicare Other | Admitting: Internal Medicine

## 2018-05-02 ENCOUNTER — Telehealth: Payer: Self-pay

## 2018-05-02 ENCOUNTER — Encounter: Payer: Self-pay | Admitting: Cardiovascular Disease

## 2018-05-02 ENCOUNTER — Other Ambulatory Visit: Payer: Self-pay

## 2018-05-02 ENCOUNTER — Ambulatory Visit: Payer: Medicare Other | Admitting: Cardiovascular Disease

## 2018-05-02 DIAGNOSIS — I480 Paroxysmal atrial fibrillation: Secondary | ICD-10-CM

## 2018-05-02 DIAGNOSIS — I10 Essential (primary) hypertension: Secondary | ICD-10-CM

## 2018-05-02 DIAGNOSIS — E785 Hyperlipidemia, unspecified: Secondary | ICD-10-CM | POA: Diagnosis not present

## 2018-05-02 DIAGNOSIS — I2542 Coronary artery dissection: Secondary | ICD-10-CM | POA: Diagnosis not present

## 2018-05-02 MED ORDER — APIXABAN 5 MG PO TABS: 5.0000 mg | ORAL_TABLET | Freq: Two times a day (BID) | ORAL | 0 refills | Status: DC

## 2018-05-02 NOTE — Telephone Encounter (Signed)
error 

## 2018-05-02 NOTE — Addendum Note (Signed)
Addended by: Annita Brod on: 05/02/2018 12:47 PM   Modules accepted: Orders

## 2018-05-02 NOTE — Patient Instructions (Signed)
Medication Instructions:  Your physician recommends that you continue on your current medications as directed. Please refer to the Current Medication list given to you today.  If you need a refill on your cardiac medications before your next appointment, please call your pharmacy.   Lab work: NONE If you have labs (blood work) drawn today and your tests are completely normal, you will receive your results only by: Marland Kitchen MyChart Message (if you have MyChart) OR . A paper copy in the mail If you have any lab test that is abnormal or we need to change your treatment, we will call you to review the results.  Testing/Procedures: NONE  Follow-Up: At [facility], you and your health needs are our priority.  As part of our continuing mission to provide you with exceptional heart care, we have created designated Provider Care Teams.  These Care Teams include your primary Cardiologist (physician) and Advanced Practice Providers (APPs -  Physician Assistants and Nurse Practitioners) who all work together to provide you with the care you need, when you need it. . You will need a follow up appointment in 6 months with [name], DNP and in 12 months with Dr. [name].  Please call our office 2 months in advance to schedule this appointment.  You may see Dr. [name] or one of the following Advanced Practice Providers on your designated Care Team:   . Kerin Ransom, [place] . Almyra Deforest, PA-C . Fabian Sharp, PA-C . [name], DNP . Rosaria Ferries, PA-C . Roby Lofts, PA-C . Sande Rives, PA-C

## 2018-05-02 NOTE — Assessment & Plan Note (Signed)
History of essential hypertension her blood pressure measured today at 164/74.  He did bring a list of his blood pressures at home which were for all intents and purposes normotensive.  He is on diltiazem, and metoprolol.

## 2018-05-02 NOTE — Progress Notes (Signed)
05/02/2018 [name]   [birth year]  [id number]  Primary Physician Glenda Chroman, MD Primary Cardiologist: Lorretta Harp MD FACP, [place], [place], [name]  HPI:  [name] is a 73 y.o.  well-appearing Caucasian male who I last saw in the office 08/27/2017.  He is accompanied by his wife today.Marland Kitchen He has a history of hypertension and hyperlipidemia. He had atypical chest pain here tone with a negative Myoview4/23/14. His pain improved with proton pump inhibition. Because of recurrent chest pain he saw [name], PA-C in the office who did obtain a 2-D echo and Myoview perfusion study all normal. His pain has improved again on a PPI suggesting that it was related to GERD. Since I saw [name] in the office a year ago he has been well until this past Christmas when he was admitted to [facility] with A. fib with RVR. He converted to normal sinus rhythm with IV diltiazem and was converted to by mouth Cardizem. His troponins were negative and 2-D echo was normal. Because of his elevated CHA2DsVASC2 score of 2 he was begun on Eliquis oral anticoagulation. He saw Roderic Palau in the A. fib clinic 04/19/16 because of palpitations. He says he occasionally feels his heart skip or missed AB. She also had a more prolonged episode of PAF lasting 20 seconds. She gave him when necessary diltiazem 30 mg a day for persistently elevated heart rate.He was just seen in the emergency room 07/07/16 by Dr. Ellender Hose. He was complaining of palpitations and was noted to have PVCs.  Since I saw him in the office 8 months ago he was admitted to the hospital and had cardiac cath by myself 02/24/2018 revealing a high-grade mid RCA stenosis.  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  His EF was preserved without inferior wall motion abnormality.  He was on "triple therapy" for 1 month after which aspirin was discontinued.  He remains on Plavix  and Eliquis..   Current Meds  Medication Sig  . acetaminophen (TYLENOL) 500 MG tablet Take 250 mg by mouth 2 (two) times daily as needed for moderate pain or headache.  Marland Kitchen apixaban (ELIQUIS) 5 MG TABS tablet Take 1 tablet (5 mg total) by mouth 2 (two) times daily.  Marland Kitchen aspirin EC 81 MG tablet Take 1 tablet (81 mg total) by mouth daily.  Marland Kitchen atorvastatin (LIPITOR) 80 MG tablet Take 1 tablet (80 mg total) by mouth at bedtime.  . clopidogrel (PLAVIX) 75 MG tablet Take 1 tablet (75 mg total) by mouth daily with breakfast.  . diltiazem (CARDIZEM CD) 120 MG 24 hr capsule Take 1 capsule (120 mg total) by mouth daily.  . famotidine (PEPCID) 20 MG tablet Take 1 tablet (20 mg total) by mouth at bedtime.  . isosorbide mononitrate (IMDUR) 30 MG 24 hr tablet Take 1 tablet (30 mg total) by mouth daily.  . metoprolol tartrate (LOPRESSOR) 25 MG tablet Take 1 tablet (25 mg total) by mouth 2 (two) times daily.  . pantoprazole (PROTONIX) 40 MG tablet Take 40 mg by mouth every morning.      Allergies  Allergen Reactions  . Lisinopril Swelling    H/o of angioedema   . Chlorthalidone Other (See Comments)    Causes severe hyponatremia  . Flexeril [Cyclobenzaprine]     Unknown reaction  . Hyoscyamine Swelling  . Prevacid [Lansoprazole] Swelling  . Valium [Diazepam] Other (See Comments)    "  makes me feel crazy"  . Potassium-Containing Compounds Palpitations    Social History   Socioeconomic History  . Marital status: Married    Spouse name: Not on file  . Number of children: 1  . Years of education: Not on file  . Highest education level: Not on file  Occupational History  . Not on file  Social Needs  . Financial resource strain: Not hard at all  . Food insecurity:    Worry: Never true    Inability: Never true  . Transportation needs:    Medical: No    Non-medical: No  Tobacco Use  . Smoking status: Never Smoker  . Smokeless tobacco: Never Used  Substance and Sexual Activity  . Alcohol use:  No  . Drug use: No  . Sexual activity: Not on file  Lifestyle  . Physical activity:    Days per week: 3 days    Minutes per session: 10 min  . Stress: Rather much  Relationships  . Social connections:    Talks on phone: Not on file    Gets together: Not on file    Attends religious service: Not on file    Active member of club or organization: Not on file    Attends meetings of clubs or organizations: Not on file    Relationship status: Not on file  . Intimate partner violence:    Fear of current or ex partner: Not on file    Emotionally abused: Not on file    Physically abused: Not on file    Forced sexual activity: Not on file  Other Topics Concern  . Not on file  Social History Narrative   ** Merged History Encounter **         Review of Systems: General: negative for chills, fever, night sweats or weight changes.  Cardiovascular: negative for chest pain, dyspnea on exertion, edema, orthopnea, palpitations, paroxysmal nocturnal dyspnea or shortness of breath Dermatological: negative for rash Respiratory: negative for cough or wheezing Urologic: negative for hematuria Abdominal: negative for nausea, vomiting, diarrhea, bright red blood per rectum, melena, or hematemesis Neurologic: negative for visual changes, syncope, or dizziness All other systems reviewed and are otherwise negative except as noted above.    Blood pressure (!) 164/74, pulse 60, height 5\' 8"  (1.727 m), weight 147 lb (66.7 kg).  General appearance: alert and no distress Neck: no adenopathy, no carotid bruit, no JVD, supple, symmetrical, trachea midline and thyroid not enlarged, symmetric, no tenderness/mass/nodules Lungs: clear to auscultation bilaterally Heart: regular rate and rhythm, S1, S2 normal, no murmur, click, rub or gallop Extremities: extremities normal, atraumatic, no cyanosis or edema Pulses: 2+ and symmetric Skin: Skin color, texture, turgor normal. No rashes or lesions Neurologic:  Alert and oriented X 3, normal strength and tone. Normal symmetric reflexes. Normal coordination and gait  EKG not performed today  ASSESSMENT AND PLAN:   Dyslipidemia History of dyslipidemia on high-dose statin therapy with lipid profile performed 04/11/2018 by his PCP revealing total cholesterol 150, LDL of 82 and HDL of 49.  Essential hypertension History of essential hypertension her blood pressure measured today at 164/74.  He did bring a list of his blood pressures at home which were for all intents and purposes normotensive.  He is on diltiazem, and metoprolol.  PAF (paroxysmal atrial fibrillation) (HCC) History of paroxysmal atrial fibrillation maintaining sinus rhythm on Eliquis oral anticoagulation.  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 History of CAD status post cardiac  cath by myself 02/24/2018 with RCA intervention that was complicated by dissection requiring 4 overlapping synergy drug-eluting stents.  His echo revealed normal LV function with an inferior wall motion abnormality.  His on Plavix at this point along with his Eliquis.  He denies chest pain or shortness of breath.      Lorretta Harp MD FACP,FACC,FAHA, [facility] 05/02/2018 10:47 AM

## 2018-05-02 NOTE — Assessment & Plan Note (Signed)
History of CAD status post cardiac cath by myself 02/24/2018 with RCA intervention that was complicated by dissection requiring 4 overlapping synergy drug-eluting stents.  His echo revealed normal LV function with an inferior wall motion abnormality.  His on Plavix at this point along with his Eliquis.  He denies chest pain or shortness of breath.

## 2018-05-02 NOTE — Telephone Encounter (Signed)
Contacted pt to make aware that two extra boxes of Eliquis 5 mg available for pick up at front desk. Pt verbalized understanding

## 2018-05-02 NOTE — Assessment & Plan Note (Signed)
History of dyslipidemia on high-dose statin therapy with lipid profile performed 04/11/2018 by his PCP revealing total cholesterol 150, LDL of 82 and HDL of 49.

## 2018-05-02 NOTE — Assessment & Plan Note (Signed)
History of paroxysmal atrial fibrillation maintaining sinus rhythm on Eliquis oral anticoagulation. 

## 2018-05-13 ENCOUNTER — Encounter (INDEPENDENT_AMBULATORY_CARE_PROVIDER_SITE_OTHER): Payer: Self-pay | Admitting: Internal Medicine

## 2018-05-13 ENCOUNTER — Ambulatory Visit (INDEPENDENT_AMBULATORY_CARE_PROVIDER_SITE_OTHER): Payer: Medicare Other | Admitting: Internal Medicine

## 2018-05-13 VITALS — BP 163/78 | HR 57 | Temp 98.1°F | Resp 18 | Ht 68.0 in | Wt 148.1 lb

## 2018-05-13 DIAGNOSIS — K219 Gastro-esophageal reflux disease without esophagitis: Secondary | ICD-10-CM | POA: Diagnosis not present

## 2018-05-13 MED ORDER — FAMOTIDINE 20 MG PO TABS: 20.0000 mg | ORAL_TABLET | Freq: Two times a day (BID) | ORAL | 5 refills | Status: DC | PRN

## 2018-05-13 NOTE — Patient Instructions (Addendum)
Please do not take pantoprazole while you are on Plavix/clopidogrel Can take Pepcid/famotidine 20 mg by mouth twice daily or twice daily as needed. Notify if this medication does not controlled heartburn.

## 2018-05-13 NOTE — Progress Notes (Signed)
Presenting complaint;  Follow-up for GERD.  Database and subjective:  Patient is 73 year old Caucasian male who has chronic GERD and 78 history of epigastric pain with negative work-up who was last seen on 02/04/2018.  Prior to that visit he had been seen in emergency room for atypical chest pain.  He has a history of paroxysmal atrial fibrillation and had been on Eliquis. He states back in December he had exertional chest pain and dyspnea and on 1 occasion he broke out in a sweat.  He was seen at cardiology clinic by Kerin Ransom, PA-C on 02/19/2018.  He underwent cardiac cath on 02/24/2018 revealing a high-grade lesion to right coronary artery disease.  He developed spiral dissection during intubation.  Dr. Alvester Chou was eventually able to place a drug-eluting stent.  He suffered a non-STEMI.  He had uneventful recovery and was discharged. Patient states that he has not had any more chest pain or shortness of breath.  He says he is not having epigastric pain anymore.  He is taking pantoprazole intermittently in the morning.  He takes Plavix in the morning 2.  He is taking famotidine on as-needed basis usually in the evening.  He denies nausea vomiting melena or rectal bleeding.  He says since he has been using fiber supplement his bowels have become more regular. His appetite is good and he has not lost any weight since his last visit.   Current Medications: Outpatient Encounter Medications as of 05/13/2018  Medication Sig  . acetaminophen (TYLENOL) 500 MG tablet Take 250 mg by mouth 2 (two) times daily as needed for moderate pain or headache.  Marland Kitchen apixaban (ELIQUIS) 5 MG TABS tablet Take 1 tablet (5 mg total) by mouth 2 (two) times daily.  Marland Kitchen atorvastatin (LIPITOR) 80 MG tablet Take 1 tablet (80 mg total) by mouth at bedtime.  . clopidogrel (PLAVIX) 75 MG tablet Take 1 tablet (75 mg total) by mouth daily with breakfast.  . diltiazem (CARDIZEM CD) 120 MG 24 hr capsule Take 1 capsule (120 mg total) by mouth  daily.  . famotidine (PEPCID) 20 MG tablet Take 1 tablet (20 mg total) by mouth at bedtime.  . isosorbide mononitrate (IMDUR) 30 MG 24 hr tablet Take 1 tablet (30 mg total) by mouth daily.  . metoprolol tartrate (LOPRESSOR) 25 MG tablet Take 1 tablet (25 mg total) by mouth 2 (two) times daily.  . pantoprazole (PROTONIX) 40 MG tablet Take 40 mg by mouth every morning.   . [DISCONTINUED] aspirin EC 81 MG tablet Take 1 tablet (81 mg total) by mouth daily. (Patient not taking: Reported on 05/13/2018)   No facility-administered encounter medications on file as of 05/13/2018.      Objective: Blood pressure (!) 163/78, pulse (!) 57, temperature 98.1 F (36.7 C), temperature source Oral, resp. rate 18, height 5\' 8"  (1.727 m), weight 148 lb 1.6 oz (67.2 kg). Patient is alert and in no acute distress. Conjunctiva is pink. Sclera is nonicteric Oropharyngeal mucosa is normal. He has upper and lower dentures in place. No neck masses or thyromegaly noted. Cardiac exam with regular rhythm normal S1 and S2. No murmur or gallop noted. Lungs are clear to auscultation. Abdomen is symmetrical soft and nontender with organomegaly or masses. No LE edema or clubbing noted.   Assessment:  #1.  Chronic GERD.  No history of erosive esophagitis.  Although he is not taking pantoprazole daily but he is taking in the morning along with clopidogrel.  Since his symptoms are mild he should  discontinue pantoprazole and just take famotidine twice daily as needed.  It is interesting to note that he has not had epigastric pain anymore since cardiac intervention.  He has had this pain intermittently for the past few years.  It may well have been ischemic pain.   Plan:  Discontinue pantoprazole. Famotidine 20 mg p.o. twice daily PRN. Patient will call office if famotidine does not control GERD symptoms. Office visit in 1 year.

## 2018-06-12 ENCOUNTER — Telehealth: Payer: Self-pay | Admitting: Adult Health

## 2018-06-12 DIAGNOSIS — I480 Paroxysmal atrial fibrillation: Secondary | ICD-10-CM

## 2018-06-12 DIAGNOSIS — Z79899 Other long term (current) drug therapy: Secondary | ICD-10-CM

## 2018-06-12 NOTE — Telephone Encounter (Signed)
Good plan.  David Harding, MD  

## 2018-06-12 NOTE — Telephone Encounter (Signed)
Pt has questions about a medication he is on. He says he is on 8 pills a day and does not know which medication is causing the SOB.

## 2018-06-12 NOTE — Telephone Encounter (Signed)
I have reviewed his medications. I do not see any reason for him to be short of breath from them. He will need to have CBC, to evaluate for anemia as he is on Plavix and Eliquis. He will need EKG to evaluate for recurrent atrial fib. If breathing gets worse, he may need to be seen in the office.

## 2018-06-12 NOTE — Telephone Encounter (Signed)
Pt notified he states that he is pretty sure that he is not in AFIB but he will come in for labs and for the EKG to make sure will schedule appt fo 10am.  Appt scheduled

## 2018-06-12 NOTE — Telephone Encounter (Signed)
Spoke with patient and he stated that he has been having shortness of breath that comes and goes. It will come on and sometimes last for a few hours and then just go away. Not brought on with exertion or relieved with rest. Patient stated that at his last visit with Arnold Long DNP in January he was having this issue and she said could be related to medications. He thinks may be getting a little worse but he wasn't really sure. Patient denies any swelling, fever, cough, or known exposure to COVID 19. Advised if breathing worse with no relief to go to ED for evaluation. Will forward to [place] for review

## 2018-06-13 ENCOUNTER — Ambulatory Visit (INDEPENDENT_AMBULATORY_CARE_PROVIDER_SITE_OTHER): Payer: Medicare Other

## 2018-06-13 ENCOUNTER — Other Ambulatory Visit: Payer: Self-pay

## 2018-06-13 VITALS — HR 51 | Ht 68.0 in | Wt 145.8 lb

## 2018-06-13 DIAGNOSIS — R0602 Shortness of breath: Secondary | ICD-10-CM

## 2018-06-13 DIAGNOSIS — I480 Paroxysmal atrial fibrillation: Secondary | ICD-10-CM | POA: Diagnosis not present

## 2018-06-13 DIAGNOSIS — Z79899 Other long term (current) drug therapy: Secondary | ICD-10-CM

## 2018-06-13 LAB — CBC
HEMATOCRIT: 37.3 % — AB (ref 37.5–51.0)
Hemoglobin: 12.5 g/dL — ABNORMAL LOW (ref 13.0–17.7)
MCH: 29.4 pg (ref 26.6–33.0)
MCHC: 33.5 g/dL (ref 31.5–35.7)
MCV: 88 fL (ref 79–97)
Platelets: 275 10*3/uL (ref 150–450)
RBC: 4.25 x10E6/uL (ref 4.14–5.80)
RDW: 13.2 % (ref 11.6–15.4)
WBC: 7.8 10*3/uL (ref 3.4–10.8)

## 2018-06-13 MED ORDER — ISOSORBIDE MONONITRATE ER 30 MG PO TB24: 30.0000 mg | ORAL_TABLET | Freq: Every day | ORAL | 1 refills | Status: DC

## 2018-06-13 MED ORDER — APIXABAN 5 MG PO TABS: 5.0000 mg | ORAL_TABLET | Freq: Two times a day (BID) | ORAL | 6 refills | Status: DC

## 2018-06-13 NOTE — Progress Notes (Signed)
PT IS HERE FOR EKG AND LAB. PT STATES THAT HE FEELS SOB INTERMITTENTLY AND CANNOT LINK IT TO ANYTHING. HE STATES THAT IT IS THERE UPON WAKING AND SOMETIMES NOT. HE STATES THAT IT IS THERE SOMETIMES WHEN WATCHING TV AND SOME TIMES NOT. HE STATES THAT HE NEEDS REFILLLS THEY HAVE BEEN SENT. I HAVE DONE HIS EKG AND IT SHOWS BRADYCARDIA AND THIS WAS REVIEWED BY Kerin Ransom, PA-C AND CONFIRMED NO AFIB. --------------- Lendon Colonel, NP to Me .  06/12/18 4:40 PM  Note  I have reviewed his medications. I do not see any reason for him to be short of breath from them. He will need to have CBC, to evaluate for anemia as he is on Plavix and Eliquis. He will need EKG to evaluate for recurrent atrial fib. If breathing gets worse, he may need to be seen in the office.

## 2018-06-15 ENCOUNTER — Other Ambulatory Visit: Payer: Self-pay | Admitting: Adult Health

## 2018-06-19 ENCOUNTER — Telehealth: Payer: Self-pay | Admitting: Cardiovascular Disease

## 2018-06-19 ENCOUNTER — Telehealth: Payer: Self-pay

## 2018-06-19 NOTE — Telephone Encounter (Signed)
He is not on any medications that would cause shortness of breath.  I reviewed his med list.  His EKG showed no acute changes, sinus bradycardia and his CBC was normal as well.  Not sure what is causing his occasional shortness of breath.  I am happy to do a virtual visit with him if he likes or can wait to see him back in the office in several months if he feels he stable.

## 2018-06-19 NOTE — Telephone Encounter (Signed)
Not needed

## 2018-06-19 NOTE — Telephone Encounter (Signed)
Cardiac Questionnaire:    Since your last visit or hospitalization:    1. Have you been having new or worsening chest pain?  NO   2. Have you been having new or worsening shortness of breath? SOB 1-2 WEEKS AGO BUT BETTER NOW 3. Have you been having new or worsening leg swelling, wt gain, or increase in abdominal girth (pants fitting more tightly)? NO   4. Have you had any passing out spells? NO    *A YES to any of these questions would result in the appointment being kept. *If all the answers to these questions are NO, we should indicate that given the current situation regarding the worldwide coronarvirus pandemic, at the recommendation of the CDC, we are looking to limit gatherings in our waiting area, and thus will reschedule their appointment beyond four weeks from today.   _____________   DTOIZ-12 Pre-Screening Questions:  . Do you currently have a fever? NO (yes = cancel and refer to pcp for e-visit) . Have you recently travelled on a cruise, internationally, or to [place], [place], [place], [place], [place], or [place], [place] [employer]) ? NO (yes = cancel, stay home, monitor symptoms, and contact pcp or initiate e-visit if symptoms develop) . Have you been in contact with someone that is currently pending confirmation of Covid19 testing or has been confirmed to have the [place] virus?  NO (yes = cancel, stay home, away from tested individual, monitor symptoms, and contact pcp or initiate e-visit if symptoms develop) . Are you currently experiencing fatigue or cough? NO (yes = pt should be prepared to have a mask placed at the time of their visit).            Virtual Visit Pre-Appointment Phone Call  Steps For Call:  1. Confirm consent - "In the setting of the current Covid19 crisis, you are scheduled for a (phone or video) visit with your provider on (date) at (time).  Just as we do with many in-office visits, in order for you to participate in this visit, we must obtain consent.  If you'd  like, I can send this to your mychart (if signed up) or email for you to review.  Otherwise, I can obtain your verbal consent now.  All virtual visits are billed to your insurance company just like a normal visit would be.  By agreeing to a virtual visit, we'd like you to understand that the technology does not allow for your provider to perform an examination, and thus may limit your provider's ability to fully assess your condition.  Finally, though the technology is pretty good, we cannot assure that it will always work on either your or our end, and in the setting of a video visit, we may have to convert it to a phone-only visit.  In either situation, we cannot ensure that we have a secure connection.  Are you willing to proceed?"  2. Give patient instructions for WebEx download to smartphone as below if video visit  3. Advise patient to be prepared with any vital sign or heart rhythm information, their current medicines, and a piece of paper and pen handy for any instructions they may receive the day of their visit  4. Inform patient they will receive a phone call 15 minutes prior to their appointment time (may be from unknown caller ID) so they should be prepared to answer  5. Confirm that appointment type is correct in Epic appointment notes (video vs telephone)    TELEPHONE CALL NOTE  TEAGEN MCLEARY has been deemed a candidate for a follow-up tele-health visit to limit community exposure during the Covid-19 pandemic. I spoke with the patient via phone to ensure availability of phone/video source, confirm preferred email & phone number, and discuss instructions and expectations.  I reminded ALIC HILBURN to be prepared with any vital sign and/or heart rhythm information that could potentially be obtained via home monitoring, at the time of his visit. I reminded RITCHIE KLEE to expect a phone call at the time of his visit if his visit.  Did the patient verbally acknowledge consent to treatment?  YES  Cydni Reddoch Dawna Part, RN 06/19/2018    DOWNLOADING THE [place], go to App Store and type in WebEx in the search bar. [employer], the blue/green circle. The app is free but as with any other app downloads, their phone may require them to verify saved payment information or Apple password. The patient does NOT have to create an account.  - If Android, ask patient to go to [employer] and type in WebEx in the search bar. [employer], the blue/green circle. The app is free but as with any other app downloads, their phone may require them to verify saved payment information or Android password. The patient does NOT have to create an account.   CONSENT FOR TELE-HEALTH VISIT - PLEASE REVIEW  I hereby voluntarily request, consent and authorize [facility] and its employed or contracted physicians, physician assistants, nurse practitioners or other licensed health care professionals (the Practitioner), to provide me with telemedicine health care services (the "Services") as deemed necessary by the treating Practitioner. I acknowledge and consent to receive the Services by the Practitioner via telemedicine. I understand that the telemedicine visit will involve communicating with the Practitioner through live audiovisual communication technology and the disclosure of certain medical information by electronic transmission. I acknowledge that I have been given the opportunity to request an in-person assessment or other available alternative prior to the telemedicine visit and am voluntarily participating in the telemedicine visit.  I understand that I have the right to withhold or withdraw my consent to the use of telemedicine in the course of my care at any time, without affecting my right to future care or treatment, and that the Practitioner or I may terminate the telemedicine visit at any time. I understand that I have the right  to inspect all information obtained and/or recorded in the course of the telemedicine visit and may receive copies of available information for a reasonable fee.  I understand that some of the potential risks of receiving the Services via telemedicine include:  Marland Kitchen Delay or interruption in medical evaluation due to technological equipment failure or disruption; . Information transmitted may not be sufficient (e.g. poor resolution of images) to allow for appropriate medical decision making by the Practitioner; and/or  . In rare instances, security protocols could fail, causing a breach of personal health information.  Furthermore, I acknowledge that it is my responsibility to provide information about my medical history, conditions and care that is complete and accurate to the best of my ability. I acknowledge that Practitioner's advice, recommendations, and/or decision may be based on factors not within their control, such as incomplete or inaccurate data provided by me or distortions of diagnostic images or specimens that may result from electronic transmissions. I understand that the practice of medicine is not an exact science and that Practitioner  makes no warranties or guarantees regarding treatment outcomes. I acknowledge that I will receive a copy of this consent concurrently upon execution via email to the email address I last provided but may also request a printed copy by calling the office of [facility].    I understand that my insurance will be billed for this visit.   I have read or had this consent read to me. . I understand the contents of this consent, which adequately explains the benefits and risks of the Services being provided via telemedicine.  . I have been provided ample opportunity to ask questions regarding this consent and the Services and have had my questions answered to my satisfaction. . I give my informed consent for the services to be provided through the use of  telemedicine in my medical care  By participating in this telemedicine visit I agree to the above.  Reviewed current medication list, allergies, pharmacy, MyChart status, and address with patient or DPR-approved person. Instructed on set up of virtual telephone or video visit. Advised to collect blood pressure, heart rate, and weight 1 hour prior to appointment if possible.

## 2018-06-19 NOTE — Telephone Encounter (Signed)
Called patient, gave lab results and EKG results.  Patient states he still does have the SOB that comes and goes, and it was mentioned that his medications could be caused by this, I advised patient I would route this message to [facility] and his nurse to make them aware.   Patient was appreciative of the call.

## 2018-06-19 NOTE — Telephone Encounter (Signed)
Called pt and LMTCB.

## 2018-06-19 NOTE — Telephone Encounter (Signed)
New message:  Patient calling concerning some lab work.

## 2018-06-19 NOTE — Telephone Encounter (Signed)
Patient called again wanting his results.

## 2018-06-19 NOTE — Telephone Encounter (Signed)
Follow up:   Patient returning call back concerning results.

## 2018-06-19 NOTE — Telephone Encounter (Signed)
Message sent to MD and nurse, from another message.

## 2018-06-19 NOTE — Telephone Encounter (Signed)
Spoke with pt, aware of dr berry's recommendations. He reports feeling better this week, he is bothered by potassium rich foods, which he is watching and that has helped. He would like to have a telephone visit with Curt Bears lawrence dnp for reassurance and also to discuss medications. Will forward to michele to contact the patient to schedule appointment.

## 2018-06-19 NOTE — Telephone Encounter (Signed)
Called pt he states that he would like to have a telephone visit to discuss "some things" like when he eats shredded wheat and Rasin bran cereals that have "loads of potassium" it feels like this heart is skipping a beat. He states that he has now switched to corn flakes and this no longer happens and has not had any issues since coming in for his EKG visit. He would like to discuss this.  Informed pt that we will be calling the day before the visit to verify medications, etc...and the morning of the visit to get vital signs. He verbalizes understanding and will await our calls

## 2018-06-20 ENCOUNTER — Telehealth (INDEPENDENT_AMBULATORY_CARE_PROVIDER_SITE_OTHER): Payer: Medicare Other | Admitting: Cardiovascular Disease

## 2018-06-20 DIAGNOSIS — Z955 Presence of coronary angioplasty implant and graft: Secondary | ICD-10-CM

## 2018-06-20 DIAGNOSIS — I2510 Atherosclerotic heart disease of native coronary artery without angina pectoris: Secondary | ICD-10-CM | POA: Diagnosis not present

## 2018-06-20 DIAGNOSIS — E785 Hyperlipidemia, unspecified: Secondary | ICD-10-CM | POA: Diagnosis not present

## 2018-06-20 DIAGNOSIS — I480 Paroxysmal atrial fibrillation: Secondary | ICD-10-CM | POA: Diagnosis not present

## 2018-06-20 NOTE — Patient Instructions (Signed)
Medication Instructions:  Your physician recommends that you continue on your current medications as directed. Please refer to the Current Medication list given to you today.  If you need a refill on your cardiac medications before your next appointment, please call your pharmacy.   Lab work: NONE If you have labs (blood work) drawn today and your tests are completely normal, you will receive your results only by: Marland Kitchen MyChart Message (if you have MyChart) OR . A paper copy in the mail If you have any lab test that is abnormal or we need to change your treatment, we will call you to review the results.  Testing/Procedures: NONE  Follow-Up: At [facility], you and your health needs are our priority.  As part of our continuing mission to provide you with exceptional heart care, we have created designated Provider Care Teams.  These Care Teams include your primary Cardiologist (physician) and Advanced Practice Providers (APPs -  Physician Assistants and Nurse Practitioners) who all work together to provide you with the care you need, when you need it. You will need a follow up appointment in 2-3 months with [name], DNP and in February 2021 with Dr. [name].  Please call our office 2 months in advance to schedule this appointment.

## 2018-06-20 NOTE — Progress Notes (Signed)
Virtual Visit via Telephone Note    Evaluation Performed:  Follow-up visit  This visit type was conducted due to national recommendations for restrictions regarding the COVID-19 Pandemic (e.g. social distancing).  This format is felt to be most appropriate for this patient at this time.  All issues noted in this document were discussed and addressed.  No physical exam was performed (except for noted visual exam findings with Video Visits).  Please refer to the patient's chart (MyChart message for video visits and phone note for telephone visits) for the patient's consent to telehealth for [facility].  Date:  06/20/2018   ID:  [name], DOB April 26, 1945, MRN [id number]  Patient Location:  His home  Provider location:   My home  PCP:  Glenda Chroman, MD  Cardiologist:  Quay Burow, MD  Electrophysiologist:  None   Chief Complaint: Shortness of breath  History of Present Illness:    [name] is a 73 y.o. male who presents via audio/video conferencing for a telehealth visit today.    The patient does not have symptoms concerning for COVID-19 infection (fever, chills, cough, or new shortness of breath).   [name] is a 73 y.o.  well-appearing Caucasian male who Ilast saw in the office  05/02/2018. He was accompanied by his wife at our last office visit.Marland Kitchen He has a history of hypertension and hyperlipidemia. He had atypical chest pain here tone with a negative Myoview4/23/14. His pain improved with proton pump inhibition. Because of recurrent chest pain he saw [name], PA-C in the office who did obtain a 2-D echo and Myoview perfusion study all normal. His pain has improved again on a PPI suggesting that it was related to GERD. Since I saw [name] in the office a year ago he has been well until this past Christmas when he was admitted to [facility] with A. fib with RVR. He converted to normal sinus rhythm with IV diltiazem and was converted to by mouth  Cardizem. His troponins were negative and 2-D echo was normal. Because of his elevated CHA2DsVASC2 score of 2 he was begun on Eliquis oral anticoagulation. He saw Roderic Palau in the A. fib clinic 04/19/16 because of palpitations. He says he occasionally feels his heart skip or missed AB. She also had a more prolonged episode of PAF lasting 20 seconds. She gave him when necessary diltiazem 30 mg a day for persistently elevated heart rate.He was just seen in the emergency room 07/07/16 by Dr. Ellender Hose. He was complaining of palpitations and was noted to have PVCs.  He was admitted to the hospital and had cardiac cath by myself 02/24/2018 revealing a high-grade mid RCA stenosis.  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  His EF was preserved without inferior wall motion abnormality.  He was on "triple therapy" for 1 month after which aspirin was discontinued.  He remains on Plavix and Eliquis.  Since I saw him in the office 2 months ago he had noticed some shortness of breath over the last several weeks.  He denies chest pain.  He had no fever or cough.  He did come into the office last Friday and had an EKG that showed sinus bradycardia in the 40s without acute changes and blood work that showed stable CBC.  He stopped taking potassium for some reason his symptoms have resolved.  Prior CV studies:   The following studies were reviewed  today:    Past Medical History:  Diagnosis Date   Anxiety    Atrial fibrillation ([place])    Cancer ([place])    testicular    Chest pain    myoview 07/09/12-normal, nl ef; echo 04/12/05-ef>55%, mild aortic sclerosis   GERD (gastroesophageal reflux disease)    H. pylori infection    Headache    Hypercholesteremia    Hyperlipidemia    Hypertension    Hyponatremia 2013   Palpitations    event monior 04/11/05- SR with occ PAC   Personal history of colonic polyps 2 9 2007   Past  Surgical History:  Procedure Laterality Date   BACK SURGERY     CATARACT EXTRACTION W/PHACO Left 12/08/2012   Procedure: CATARACT EXTRACTION PHACO AND INTRAOCULAR LENS PLACEMENT ([place]);  Surgeon: Tonny Branch, MD;  Location: [facility];  Service: Ophthalmology;  Laterality: Left;  CDE:  23.19   CATARACT EXTRACTION W/PHACO Right 04/15/2014   Procedure: CATARACT EXTRACTION PHACO AND INTRAOCULAR LENS PLACEMENT RIGHT;  Surgeon: Tonny Branch, MD;  Location: [facility];  Service: Ophthalmology;  Laterality: Right;  CDE:10.32   COLONOSCOPY  12/06/2010   Procedure: COLONOSCOPY;  Surgeon: Rogene Houston, MD;  [facility];  Service: Endoscopy;  Laterality: N/A;   COLONOSCOPY N/A 05/23/2016   Procedure: COLONOSCOPY;  Surgeon: Rogene Houston, MD;  [facility];  Service: Endoscopy;  Laterality: N/A;  930   CORONARY STENT INTERVENTION N/A 02/24/2018   Procedure: CORONARY STENT INTERVENTION;  Surgeon: Lorretta Harp, MD;  Location: [place] CV LAB;  Service: Cardiovascular;  Laterality: N/A;   ESOPHAGOGASTRODUODENOSCOPY N/A 07/20/2015   Procedure: ESOPHAGOGASTRODUODENOSCOPY (EGD);  Surgeon: Rogene Houston, MD;  [facility];  Service: Endoscopy;  Laterality: N/A;  2:55 - moved to 1:55 - Ann notified pt   EYE SURGERY     LEFT HEART CATH AND CORONARY ANGIOGRAPHY N/A 02/24/2018   Procedure: LEFT HEART CATH AND CORONARY ANGIOGRAPHY;  Surgeon: Lorretta Harp, MD;  Location: [place] CV LAB;  Service: Cardiovascular;  Laterality: N/A;   LUMBAR DISC SURGERY Left 2007   "L4-5 ruptured discs"   POLYPECTOMY  05/23/2016   Procedure: POLYPECTOMY;  Surgeon: Rogene Houston, MD;  [facility];  Service: Endoscopy;;  colon     No outpatient medications have been marked as taking for the 06/20/18 encounter (Telemedicine) with Lorretta Harp, MD.     Allergies:   Lisinopril; Chlorthalidone; Flexeril [cyclobenzaprine]; Hyoscyamine; Prevacid [lansoprazole]; Valium  [diazepam]; and Potassium-containing compounds   Social History   Tobacco Use   Smoking status: Never Smoker   Smokeless tobacco: Never Used  Substance Use Topics   Alcohol use: No   Drug use: No     Family Hx: The patient's family history includes CAD in his father; Hyperlipidemia in his father; Hypertension in his father.  ROS:   Please see the history of present illness.     All other systems reviewed and are negative.   Labs/Other Tests and Data Reviewed:    Recent Labs: 08/27/2017: ALT 21 03/06/2018: BUN 9; Creatinine, Ser 0.85; Potassium 4.3; Sodium 131 06/13/2018: Hemoglobin 12.5; Platelets 275   Recent Lipid Panel Lab Results  Component Value Date/Time   CHOL 174 08/27/2017 08:59 AM   TRIG 150 (H) 08/27/2017 08:59 AM   HDL 51 08/27/2017 08:59 AM   CHOLHDL 3.4 08/27/2017 08:59 AM   CHOLHDL 3.7 12/20/2014 09:47 AM   LDLCALC 93 08/27/2017 08:59 AM    Wt  Readings from Last 3 Encounters:  06/13/18 145 lb 12.8 oz (66.1 kg)  05/13/18 148 lb 1.6 oz (67.2 kg)  05/02/18 147 lb (66.7 kg)     Objective:    Vital Signs:  BP 136/68    Pulse (!) 48    A physical exam was not performed since this was a telemedicine phone visit.  ASSESSMENT & PLAN:    1.  Dyslipidemia- history of dyslipidemia on statin therapy with lipid profile performed 04/11/2018 by his PCP revealing a total cholesterol 150, LDL of 82 and HDL 49  2.  Essential hypertension- history of essential hypertension blood pressure measured today by himself at home of 136/68 with a pulse of 48.  He is on diltiazem and metoprolol.  3.  Paroxysmal atrial fibrillation- maintaining sinus rhythm on Eliquis oral anticoagulation  4.  Coronary artery disease- history of CAD status post cardiac catheterization performed by myself 02/24/2018 with RCA intervention complicated by long dissection requiring 4 overlapping drug-eluting stents.  He was on "triple therapy" for a month because of the need to be on Eliquis for  PAF and his aspirin was ultimately discontinued.  He denies chest pain.  His EF was normal by 2D echo.    COVID-19 Education: The signs and symptoms of COVID-19 were discussed with the patient and how to seek care for testing (follow up with PCP or arrange E-visit).  The importance of social distancing was discussed today.  Patient Risk:   After full review of this patient's clinical status, I feel that they are at least moderate risk at this time.  Time:   Today, I have spent 11 minutes with the patient with telehealth technology discussing shortness of breath, his history of CAD, COVID-19 precautions..     Medication Adjustments/Labs and Tests Ordered: Current medicines are reviewed at length with the patient today.  Concerns regarding medicines are outlined above.  Tests Ordered: No orders of the defined types were placed in this encounter.  Medication Changes: No orders of the defined types were placed in this encounter.   Disposition:  Follow up in 3 month(s)  Signed, Quay Burow, MD  06/20/2018 11:29 AM    [facility]

## 2018-06-23 ENCOUNTER — Telehealth: Payer: Medicare Other | Admitting: Adult Health

## 2018-07-04 ENCOUNTER — Telehealth: Payer: Self-pay | Admitting: Adult Health

## 2018-07-04 NOTE — Telephone Encounter (Signed)
  Called patient to reschedule appt with [name] in June

## 2018-07-08 ENCOUNTER — Other Ambulatory Visit: Payer: Self-pay | Admitting: Adult Health

## 2018-07-22 ENCOUNTER — Ambulatory Visit (HOSPITAL_COMMUNITY)
Admission: RE | Admit: 2018-07-22 | Discharge: 2018-07-22 | Disposition: A | Discharge status: Home / Self Care | Payer: Medicare Other | Source: Ambulatory Visit | Attending: Nurse Practitioner | Admitting: Nurse Practitioner

## 2018-07-22 ENCOUNTER — Other Ambulatory Visit (HOSPITAL_COMMUNITY): Payer: Self-pay | Admitting: Nurse Practitioner

## 2018-07-22 ENCOUNTER — Other Ambulatory Visit: Payer: Self-pay

## 2018-07-22 DIAGNOSIS — R0602 Shortness of breath: Secondary | ICD-10-CM | POA: Insufficient documentation

## 2018-07-23 ENCOUNTER — Telehealth: Payer: Self-pay | Admitting: Cardiovascular Disease

## 2018-07-23 NOTE — Telephone Encounter (Signed)
I looked at his CXR and there was nothing there that would be causing SOB

## 2018-07-23 NOTE — Telephone Encounter (Signed)
New Message:     Patient calling to state that he is still having some SOB. Patient had a xray and would for doctor [name] to look at it.

## 2018-07-23 NOTE — Telephone Encounter (Signed)
Returned the pt call. Lmom. Message fwd to Dr.Berry and [place]. pt is to call back in the interim if needed.

## 2018-07-24 NOTE — Telephone Encounter (Signed)
Follow up   Pts wife is calling back and is awaiting a call back. They believe he has some fluid in his lungs   Please call

## 2018-07-24 NOTE — Telephone Encounter (Signed)
Pt wanted to let Dr. [name] know that since he is still having SOB with exertion he had a chest X-ray per Dr. Woody Seller and there was a LLL opacity so he will be having a follow up CT. I advised him to talk to Dr. Woody Seller about potential allergies, maybe repeat labs, or any other reason nit cardiac for his SOB. He will contact us after he gets his CT.

## 2018-07-29 ENCOUNTER — Other Ambulatory Visit: Payer: Self-pay | Admitting: Nurse Practitioner

## 2018-07-29 ENCOUNTER — Other Ambulatory Visit (HOSPITAL_COMMUNITY): Payer: Self-pay | Admitting: Nurse Practitioner

## 2018-07-29 DIAGNOSIS — R9389 Abnormal findings on diagnostic imaging of other specified body structures: Secondary | ICD-10-CM

## 2018-08-04 ENCOUNTER — Encounter (HOSPITAL_COMMUNITY): Payer: Self-pay

## 2018-08-04 ENCOUNTER — Ambulatory Visit (HOSPITAL_COMMUNITY): Payer: Medicare Other

## 2018-08-25 ENCOUNTER — Telehealth: Payer: Medicare Other | Admitting: Adult Health

## 2018-09-17 ENCOUNTER — Other Ambulatory Visit: Payer: Self-pay | Admitting: Physician Assistant

## 2018-09-25 ENCOUNTER — Other Ambulatory Visit: Payer: Self-pay

## 2018-09-25 ENCOUNTER — Ambulatory Visit (HOSPITAL_COMMUNITY)
Admission: RE | Admit: 2018-09-25 | Discharge: 2018-09-25 | Disposition: A | Discharge status: Home / Self Care | Payer: Medicare Other | Source: Ambulatory Visit | Attending: Nurse Practitioner | Admitting: Nurse Practitioner

## 2018-09-25 DIAGNOSIS — R9389 Abnormal findings on diagnostic imaging of other specified body structures: Secondary | ICD-10-CM

## 2018-10-27 NOTE — Telephone Encounter (Signed)
This encounter was created in error - please disregard.

## 2018-11-15 ENCOUNTER — Telehealth: Payer: Self-pay | Admitting: Medical

## 2018-11-15 NOTE — Telephone Encounter (Signed)
   Patient called the after hours line with complaints of palpitations. He has a history of paroxysmal atrial fibrillation and PVCs for which he takes diltiazem and metoprolol. Recently he has noticed an increase in frequency of palpitations. He doesn't feel like his is in atrial fibrillation but more likely having increased in PVCs. No complaints of SOB, dizziness, lightheadedness, or syncope. He does report some intermittent chest pain which is non-exertional and frequently occurs at night and improves with PPI. He reports feeling nervous given his heart history. HR has been in the 50s which limits titration of his AV nodal blocking agents. Favor watchful waiting at this time. Will route to scheduling to attempt to get him an in-office visit in the next week to follow-up on his symptoms. Patient in agreement with the plan and given instructions to present to the ED should he experience exertional chest pain, chest pain not relieved with antacid, SOB, or a racing heart beat.   Abigail Butts, PA-C 11/15/18; 3:36 PM

## 2018-11-25 NOTE — Progress Notes (Deleted)
{Choose 1 Note Type (Telehealth Visit or Telephone Visit):[phone]}   Date:  11/25/2018   ID:  [name], DOB [birth year], MRN [id number]  {Patient Location:[id number]::"Home"} {Provider Location:([phone]::"Home"}  PCP:  Glenda Chroman, MD  Cardiologist:  Quay Burow, MD  Electrophysiologist:  None   Evaluation Performed:  {Choose Visit [phone]::"Follow-Up Visit"}  Chief Complaint:  ***  History of Present Illness:    [name] is a 73 y.o. male we are following for ongoing assessment and management of atypical chest pain, most recent Myoview was normal, low risk, it was felt his discomfort was related to GERD and she he was placed on PPI.  He also has a history of atrial fib with admission to [facility] with A. fib RVR converted to normal sinus rhythm with IV diltiazem.  He was started on Eliquis, and did follow-up in the A. fib clinic.  At that time he was having episodes of occasionally feeling racing heart rate lasting 20 seconds or more.  He was told to take diltiazem 30 mg if he had persistent elevation in his heart rate as necessary.  Other cardiac history includes coronary artery disease with cardiac catheterization completed by Dr. [name] on 02/24/2018 which demonstrated a high grade mid RCA stenosis, with significant dissection of his RCA with balloon dilatation requiring 4 overlapping Synergy drug-eluting stents.  He had normal ejection fraction.  Aspirin was discontinued after 1 month and he remained on Plavix and Eliquis thereafter.  He continues to be treated for dyslipidemia, with a goal of LDL of 70, and hypertension.  He was last seen via telemedicine on 06/20/2018 and was doing well with no new testing or medication changes completed at that time.  The patient called our office on 11/15/2018 with complaints of worsening palpitations.  He had no additional complaints of lightheadedness dizziness.  The patient did describe some significant anxiety.  He  was also noted to have some chest discomfort which resolved with PPI.  Due to reduced heart rate in the 50s, titration of metoprolol was not favored.  The patient {does/does not:200015} have symptoms concerning for COVID-19 infection (fever, chills, cough, or new shortness of breath).    Past Medical History:  Diagnosis Date   Anxiety    Atrial fibrillation ([place])    Cancer ([place])    testicular    Chest pain    myoview 07/09/12-normal, nl ef; echo 04/12/05-ef>55%, mild aortic sclerosis   GERD (gastroesophageal reflux disease)    H. pylori infection    Headache    Hypercholesteremia    Hyperlipidemia    Hypertension    Hyponatremia 2013   Palpitations    event monior 04/11/05- SR with occ PAC   Personal history of colonic polyps 2 9 2007   Past Surgical History:  Procedure Laterality Date   BACK SURGERY     CATARACT EXTRACTION W/PHACO Left 12/08/2012   Procedure: CATARACT EXTRACTION PHACO AND INTRAOCULAR LENS PLACEMENT ([place]);  Surgeon: Tonny Branch, MD;  Location: [facility];  Service: Ophthalmology;  Laterality: Left;  CDE:  23.19   CATARACT EXTRACTION W/PHACO Right 04/15/2014   Procedure: CATARACT EXTRACTION PHACO AND INTRAOCULAR LENS PLACEMENT RIGHT;  Surgeon: Tonny Branch, MD;  Location: [facility];  Service: Ophthalmology;  Laterality: Right;  CDE:10.32   COLONOSCOPY  12/06/2010   Procedure: COLONOSCOPY;  Surgeon: Rogene Houston, MD;  [facility];  Service: Endoscopy;  Laterality: N/A;   COLONOSCOPY N/A 05/23/2016   Procedure: COLONOSCOPY;  Surgeon: Rogene Houston,  MD;  [facility];  Service: Endoscopy;  Laterality: N/A;  930   CORONARY STENT INTERVENTION N/A 02/24/2018   Procedure: CORONARY STENT INTERVENTION;  Surgeon: Lorretta Harp, MD;  Location: [place] CV LAB;  Service: Cardiovascular;  Laterality: N/A;   ESOPHAGOGASTRODUODENOSCOPY N/A 07/20/2015   Procedure: ESOPHAGOGASTRODUODENOSCOPY (EGD);  Surgeon: Rogene Houston, MD;  [facility];  Service: Endoscopy;  Laterality: N/A;  2:55 - moved to 1:55 - Ann notified pt   EYE SURGERY     LEFT HEART CATH AND CORONARY ANGIOGRAPHY N/A 02/24/2018   Procedure: LEFT HEART CATH AND CORONARY ANGIOGRAPHY;  Surgeon: Lorretta Harp, MD;  Location: [place] CV LAB;  Service: Cardiovascular;  Laterality: N/A;   LUMBAR DISC SURGERY Left 2007   "L4-5 ruptured discs"   POLYPECTOMY  05/23/2016   Procedure: POLYPECTOMY;  Surgeon: Rogene Houston, MD;  [facility];  Service: Endoscopy;;  colon     No outpatient medications have been marked as taking for the 11/26/18 encounter (Appointment) with Lendon Colonel, NP.     Allergies:   Lisinopril, Chlorthalidone, Flexeril [cyclobenzaprine], Hyoscyamine, Prevacid [lansoprazole], Valium [diazepam], and Potassium-containing compounds   Social History   Tobacco Use   Smoking status: Never Smoker   Smokeless tobacco: Never Used  Substance Use Topics   Alcohol use: No   Drug use: No     Family Hx: The patient's family history includes CAD in his father; Hyperlipidemia in his father; Hypertension in his father.  ROS:   Please see the history of present illness.    *** All other systems reviewed and are negative.   Prior CV studies:   The following studies were reviewed today: Echocardiogram 02/25/2018  Left ventricle: The cavity size was normal. Systolic function was   normal. The estimated ejection fraction was in the range of 55%   to 60%. Hypokinesis of the basal-midinferolateral myocardium;   consistent with ischemia or infarction in the distribution of the   right coronary or left circumflex coronary artery. Left   ventricular diastolic function parameters were normal. - Aortic valve: Valve area (Vmax): 2.45 cm^2. - Mitral valve: There was mild regurgitation.  Left Heart Cath 02/24/2018   Mid RCA lesion is 95% stenosed.  A stent was successfully placed.  Post intervention, there is a 0%  residual stenosis.  Post Atrio lesion is 100% stenosed.  The left ventricular systolic function is normal.  LV end diastolic pressure is normal.  The left ventricular ejection fraction is 55-65% by visual estimate.  Labs/Other Tests and Data Reviewed:    EKG:  {EKG/Telemetry Strips Reviewed:[phone]}  Recent Labs: 03/06/2018: BUN 9; Creatinine, Ser 0.85; Potassium 4.3; Sodium 131 06/13/2018: Hemoglobin 12.5; Platelets 275   Recent Lipid Panel Lab Results  Component Value Date/Time   CHOL 174 08/27/2017 08:59 AM   TRIG 150 (H) 08/27/2017 08:59 AM   HDL 51 08/27/2017 08:59 AM   CHOLHDL 3.4 08/27/2017 08:59 AM   CHOLHDL 3.7 12/20/2014 09:47 AM   LDLCALC 93 08/27/2017 08:59 AM    Wt Readings from Last 3 Encounters:  06/13/18 145 lb 12.8 oz (66.1 kg)  05/13/18 148 lb 1.6 oz (67.2 kg)  05/02/18 147 lb (66.7 kg)     Objective:    Vital Signs:  There were no vitals taken for this visit.   {HeartCare Virtual Exam (Optional):[phone]::"VITAL SIGNS:  reviewed"}  ASSESSMENT & PLAN:    1. ***  COVID-19 Education: The signs and symptoms of  COVID-19 were discussed with the patient and how to seek care for testing (follow up with PCP or arrange E-visit).  ***The importance of social distancing was discussed today.  Time:   Today, I have spent *** minutes with the patient with telehealth technology discussing the above problems.     Medication Adjustments/Labs and Tests Ordered: Current medicines are reviewed at length with the patient today.  Concerns regarding medicines are outlined above.   Tests Ordered: No orders of the defined types were placed in this encounter.   Medication Changes: No orders of the defined types were placed in this encounter.   Disposition:  Follow up {follow up:15908}  Signed, Phill Myron. West Pugh, ANP, AACC  11/25/2018 3:15 PM    [facility]

## 2018-11-26 ENCOUNTER — Ambulatory Visit: Payer: Medicare Other | Admitting: Adult Health

## 2018-12-01 ENCOUNTER — Telehealth: Payer: Self-pay | Admitting: Cardiovascular Disease

## 2018-12-01 NOTE — Telephone Encounter (Signed)
[id number] 

## 2018-12-01 NOTE — Telephone Encounter (Signed)
I am not sure why this is happening. May need to consider a sleep study to check for apnea causing these symptoms or have him wear a cardiac monitor to evaluate what his heart rate is doing with these symptoms. Would talk to PCP about mild dose of antianxiety medication to see if this is helpful.  Would not want to increase his heart rate medication as his HR is normal per his report.   KL

## 2018-12-01 NOTE — Telephone Encounter (Signed)
Pt called back he states that for the last 1-2 weeks ago when he is trying to sleep at night his HR pauses and skips and sometimes races and this wakes him up and states that he cannot go back to sleep. He states that last night when this was happening his BP was 146/67 HR 64 then later it was 138/67HR 52. He states that he does not know how long that this lasts. He follows a low sodium diet because he states that he has a "sodium problem".  He states that he takes all his medications as ordered. He has decreased his caffeine intake. He states that he has an upcoming appointment with Purcell Nails, [place] 12-11-18. What should he do until his scheduled appointment? Please advise

## 2018-12-01 NOTE — Telephone Encounter (Signed)
Pt informed of providers recommendations. Pt verbalized understanding. He will call and discuss with PCP.

## 2018-12-01 NOTE — Telephone Encounter (Signed)
New Message    Patient c/o Palpitations:  High priority if patient c/o lightheadedness, shortness of breath, or chest pain  1) How long have you had palpitations/irregular HR/ Afib? Are you having the symptoms now? For about a week and when he sits still he can feel skipping  2) Are you currently experiencing lightheadedness, SOB or CP? No  3) Do you have a history of afib (atrial fibrillation) or irregular heart rhythm? Yes   Have you checked your BP or HR? (document readings if available): No  4) Are you experiencing any other symptoms? NO

## 2018-12-10 NOTE — Progress Notes (Signed)
Cardiology Office Note   Date:  12/11/2018   ID:  Chano, Rinne 1945-05-07, MRN [id number]  PCP:  Glenda Chroman, MD  Cardiologist:  Avelina Laine  CC: Follow Up   History of Present Illness: [name] is a 73 y.o. male who presents for ongoing assessment and management of atrial fib, on Eliquis, hyperlipidemia, hypertension, atypical chest pain with negative Myoview on 07/09/2012.  He was found to have GERD and symptoms were relieved with PPI.  He is also followed by Roderic Palau, NP, in the A. fib clinic.  He continued to have PAF.  He was told to take an additional diltiazem 30 mg for persistently elevated heart rate.  He goes to the ER for worsening palpitations and was diagnosed with PVCs only.  He had a cardiac catheterization on 02/24/2018 revealing high-grade mid RCA stenosis.  He had significant dissection of his RCA during balloon dilatation requiring 4 overlapping Synergy drug-eluting stents.  He had normal EF and was placed on "triple therapy for 1 month to include aspirin Plavix and Eliquis, and then aspirin was discontinued.  He was seen by Dr. Alvester Chou last in the office on 06/20/2018 without complaints at that time.  He was maintaining normal sinus rhythm, had no complaints of bleeding.  Or chest discomfort.  Unfortunately, the patient called our office on 11/15/2018 with recurrent palpitations which he noticed had increased in frequency.  He had no complaints of shortness of breath or dizziness lightheadedness or syncope.    He did report some intermittent discomfort with the palpitations.  They usually subside after taking a PPI.  This was reported to Dr. [name] who favored watchful waiting.  However the patient continued to have complaints of shortness of breath and palpitations and was scheduled to be seen today.  It was possible that the patient also was having some anxiety associated with his health status with known CAD which is quite worrisome to him.  He has seen his PCP and  has been prescribed Ativan 0.05 mg to take prn anxiety. He has been afraid to take it because he is worried about side effects. He states that he continues to feel palpitations, most notably at night.  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 pylori infection    Headache    Hypercholesteremia    Hyperlipidemia    Hypertension    Hyponatremia 2013   Palpitations    event monior 04/11/05- SR with occ PAC   Personal history of colonic polyps 2 9 2007    Past Surgical History:  Procedure Laterality Date   BACK SURGERY     CATARACT EXTRACTION W/PHACO Left 12/08/2012   Procedure: CATARACT EXTRACTION PHACO AND INTRAOCULAR LENS PLACEMENT ([place]);  Surgeon: Tonny Branch, MD;  Location: [facility];  Service: Ophthalmology;  Laterality: Left;  CDE:  23.19   CATARACT EXTRACTION W/PHACO Right 04/15/2014   Procedure: CATARACT EXTRACTION PHACO AND INTRAOCULAR LENS PLACEMENT RIGHT;  Surgeon: Tonny Branch, MD;  Location: [facility];  Service: Ophthalmology;  Laterality: Right;  CDE:10.32   COLONOSCOPY  12/06/2010   Procedure: COLONOSCOPY;  Surgeon: Rogene Houston, MD;  Location: [facility];  Service: Endoscopy;  Laterality: N/A;   COLONOSCOPY N/A 05/23/2016   Procedure: COLONOSCOPY;  Surgeon: Rogene [place], MD;  Location: [facility];  Service: Endoscopy;  Laterality: N/A;  930   CORONARY STENT INTERVENTION N/A 02/24/2018   Procedure: CORONARY STENT INTERVENTION;  Surgeon: Lorretta Harp, MD;  Location: [facility];  Service: Cardiovascular;  Laterality: N/A;   ESOPHAGOGASTRODUODENOSCOPY N/A 07/20/2015   Procedure: ESOPHAGOGASTRODUODENOSCOPY (EGD);  Surgeon: Rogene [place], MD;  Location: [facility];  Service: Endoscopy;  Laterality: N/A;  2:55 - moved to 1:55 - Ann notified pt     EYE SURGERY     LEFT HEART CATH AND CORONARY ANGIOGRAPHY N/A 02/24/2018   Procedure: LEFT HEART CATH AND CORONARY ANGIOGRAPHY;  Surgeon: Lorretta Harp, MD;  Location: [facility];  Service: Cardiovascular;  Laterality: N/A;   LUMBAR DISC SURGERY Left 2007   "L4-5 ruptured discs"   POLYPECTOMY  05/23/2016   Procedure: POLYPECTOMY;  Surgeon: Rogene [place], MD;  Location: [facility];  Service: Endoscopy;;  colon     Current Outpatient Medications  Medication Sig Dispense Refill   acetaminophen (TYLENOL) 500 MG tablet Take 250 mg by mouth 2 (two) times daily as needed for moderate pain or headache.     apixaban (ELIQUIS) 5 MG TABS tablet Take 1 tablet (5 mg total) by mouth 2 (two) times daily. 180 tablet 3   atorvastatin (LIPITOR) 80 MG tablet Take 1 tablet (80 mg total) by mouth at bedtime. 30 tablet 11   clopidogrel (PLAVIX) 75 MG tablet Take 1 tablet (75 mg total) by mouth daily. 90 tablet 3   diltiazem (CARDIZEM CD) 120 MG 24 hr capsule Take 1 capsule (120 mg total) by mouth daily. 30 capsule 12   famotidine (PEPCID) 20 MG tablet Take 1 tablet (20 mg total) by mouth 2 (two) times daily as needed for heartburn or indigestion. 60 tablet 5   metoprolol tartrate (LOPRESSOR) 25 MG tablet TAKE 1 TABLET BY MOUTH 2 TIMES A DAY 60 tablet 11   isosorbide mononitrate (IMDUR) 30 MG 24 hr tablet Take 1 tablet (30 mg total) by mouth daily. 90 tablet 3   No current facility-administered medications for this visit.     Allergies:   Lisinopril, Chlorthalidone, Flexeril [cyclobenzaprine], Hyoscyamine, Prevacid [lansoprazole], Valium [diazepam], and Potassium-containing compounds    Social History:  The patient  reports that he has never smoked. He has never used smokeless tobacco. He reports that he does not drink alcohol or use drugs.   Family History:  The patient's family history includes CAD in his father; Hyperlipidemia in his father; Hypertension in his father.    ROS:  All other systems are reviewed and negative. Unless otherwise mentioned in H&P    PHYSICAL EXAM: VS:  BP (!) 156/58    Pulse (!) 56    Ht 5\' 8"  (1.727 m)    Wt 147 lb (66.7 kg)    BMI 22.35 kg/m  , BMI Body mass index is 22.35 kg/m. GEN: Well nourished, well developed, in no acute distress HEENT: normal Neck: no JVD, carotid bruits, or masses Cardiac: RRR; occasional extra systole, no murmurs, rubs, or gallops,no edema  Respiratory:  Clear to auscultation bilaterally, normal work of breathing GI: soft, nontender, nondistended, + BS MS: no deformity or atrophy Skin: warm and dry, no rash Neuro:  Strength and sensation are intact Psych: euthymic mood, full affect anxious   EKG: Sinus bradycardia, rate of 56 bpm. Incomplete RBBB.   Recent Labs: 03/06/2018: BUN 9; Creatinine, Ser 0.85; Potassium 4.3; Sodium 131  06/13/2018: Hemoglobin 12.5; Platelets 275    Lipid Panel    Component Value Date/Time   CHOL 174 08/27/2017 0859   TRIG 150 (H) 08/27/2017 0859   HDL 51 08/27/2017 0859   CHOLHDL 3.4 08/27/2017 0859   CHOLHDL 3.7 12/20/2014 0947   VLDL 20 12/20/2014 0947   LDLCALC 93 08/27/2017 0859      Wt Readings from Last 3 Encounters:  12/11/18 147 lb (66.7 kg)  06/13/18 145 lb 12.8 oz (66.1 kg)  05/13/18 148 lb 1.6 oz (67.2 kg)      Other studies Reviewed: Echocardiogram 08-Mar-2018 Left ventricle: The cavity size was normal. Systolic function was   normal. The estimated ejection fraction was in the range of 55%   to 60%. Hypokinesis of the basal-midinferolateral myocardium;   consistent with ischemia or infarction in the distribution of the   right coronary or left circumflex coronary artery. Left   ventricular diastolic function parameters were normal. - Aortic valve: Valve area (Vmax): 2.45 cm^2. - Mitral valve: There was mild regurgitation.  Left heart cath 02/24/2018  Mid RCA lesion is 95% stenosed.  A stent was successfully placed.  Post intervention, there  is a 0% residual stenosis.  Post Atrio lesion is 100% stenosed.  The left ventricular systolic function is normal.  LV end diastolic pressure is normal.  The left ventricular ejection fraction is 55-65% by visual estimate  ASSESSMENT AND PLAN:  1.  CAD: History of 4 overlapping Synergy drug-eluting stents to the RCA.  Continues on Plavix but no aspirin as he is on Eliquis as well.  No recurrent chest pain.  The patient sometimes has heartburn but not related to cardiac issues.  He sometimes gets confused concerning this as he is very anxious about his health.  2.  PAF: He continues on Eliquis and has good rate control on metoprolol 25 mg twice daily.  No bleeding issues.  Followed by A. fib clinic.  3.  Frequent PVCs: Although not symptomatic, the patient is very aware of the heart rate irregularities that occur on occasion.  I have reassured him that these are benign and not likely to affect his heart function or be worsened by his CAD.  4.  Anxiety about health: He has been seen by his primary care physician who is prescribed Ativan.  He has been afraid to take it due to side effects concerning his heart medications.  I have assured him that it is okay for him to take him and it may help him to feel better.  He may take half a tablet to see if he can tolerate it before taking a whole tablet to make him more comfortable concerning his response to the medication.  He is to follow-up with PCP concerning any changes in his medication concerning his anxiety   Current medicines are reviewed at length with the patient today.    Labs/ tests ordered today include: None  Phill Myron. West Pugh, ANP, [facility]   12/11/2018 9:49 AM    [facility] [address] Office [phone] Fax [phone]

## 2018-12-11 ENCOUNTER — Ambulatory Visit (INDEPENDENT_AMBULATORY_CARE_PROVIDER_SITE_OTHER): Payer: Medicare Other | Admitting: Adult Health

## 2018-12-11 ENCOUNTER — Encounter: Payer: Self-pay | Admitting: Adult Health

## 2018-12-11 ENCOUNTER — Other Ambulatory Visit: Payer: Self-pay

## 2018-12-11 VITALS — BP 156/58 | HR 56 | Ht 68.0 in | Wt 147.0 lb

## 2018-12-11 DIAGNOSIS — I10 Essential (primary) hypertension: Secondary | ICD-10-CM

## 2018-12-11 DIAGNOSIS — E7800 Pure hypercholesterolemia, unspecified: Secondary | ICD-10-CM

## 2018-12-11 DIAGNOSIS — I2510 Atherosclerotic heart disease of native coronary artery without angina pectoris: Secondary | ICD-10-CM

## 2018-12-11 DIAGNOSIS — I480 Paroxysmal atrial fibrillation: Secondary | ICD-10-CM | POA: Diagnosis not present

## 2018-12-11 MED ORDER — APIXABAN 5 MG PO TABS: 5.0000 mg | ORAL_TABLET | Freq: Two times a day (BID) | ORAL | 3 refills | Status: DC

## 2018-12-11 MED ORDER — CLOPIDOGREL BISULFATE 75 MG PO TABS: 75.0000 mg | ORAL_TABLET | Freq: Every day | ORAL | 3 refills | Status: DC

## 2018-12-11 MED ORDER — ISOSORBIDE MONONITRATE ER 30 MG PO TB24: 30.0000 mg | ORAL_TABLET | Freq: Every day | ORAL | 3 refills | Status: DC

## 2018-12-11 NOTE — Patient Instructions (Signed)
Medication Instructions:  Continue current medications  If you need a refill on your cardiac medications before your next appointment, please call your pharmacy.  Labwork: None Ordered   Testing/Procedures: None Ordered  Follow-Up: You will need a follow up appointment in 6 months.  Please call our office 2 months in advance to schedule this appointment.  You may see Jonathan Berry, MD or one of the following Advanced Practice Providers on your designated Care Team:   Luke Kilroy, PA-C Krista Kroeger, PA-C . Callie Goodrich, PA-C     At [facility], you and your health needs are our priority.  As part of our continuing mission to provide you with exceptional heart care, we have created designated Provider Care Teams.  These Care Teams include your primary Cardiologist (physician) and Advanced Practice Providers (APPs -  Physician Assistants and Nurse Practitioners) who all work together to provide you with the care you need, when you need it.  Thank you for choosing [facility] at [facility]!!     

## 2019-02-16 ENCOUNTER — Other Ambulatory Visit: Payer: Self-pay | Admitting: Physician Assistant

## 2019-03-20 ENCOUNTER — Other Ambulatory Visit: Payer: Self-pay | Admitting: Adult Health

## 2019-03-24 ENCOUNTER — Other Ambulatory Visit: Payer: Self-pay | Admitting: Adult Health

## 2019-03-24 ENCOUNTER — Other Ambulatory Visit: Payer: Self-pay | Admitting: Pharmacist Clinician (PhC)/ Clinical Pharmacy Specialist

## 2019-03-24 MED ORDER — DILTIAZEM HCL ER COATED BEADS 120 MG PO CP24: 120.0000 mg | ORAL_CAPSULE | Freq: Every day | ORAL | 2 refills | Status: DC

## 2019-03-24 NOTE — Telephone Encounter (Signed)
*  STAT* If patient is at the pharmacy, call can be transferred to refill team.   1. Which medications need to be refilled? (please list name of each medication and dose if known) diltiazem (CARDIZEM CD) 120 MG 24 hr capsule  2. Which pharmacy/location (including street and city if local pharmacy) is medication to be sent to? [place], [place] [id number]  3. Do they need a 30 day or 90 day supply? 30 day  Patient has 1 tablet left

## 2019-03-24 NOTE — Telephone Encounter (Signed)
Patient LM on CVRR VM.  Asked that I call back as he has a medication question.    Returned call, [facility]

## 2019-03-24 NOTE — Telephone Encounter (Signed)
Rx has been sent to the pharmacy electronically. ° °

## 2019-03-25 ENCOUNTER — Other Ambulatory Visit: Payer: Self-pay | Admitting: Adult Health

## 2019-03-25 MED ORDER — DILTIAZEM HCL ER COATED BEADS 120 MG PO CP24: 120.0000 mg | ORAL_CAPSULE | Freq: Every day | ORAL | 3 refills | Status: DC

## 2019-03-25 NOTE — Telephone Encounter (Signed)
Requested Prescriptions   Pending Prescriptions Disp Refills  . diltiazem (CARTIA XT) 120 MG 24 hr capsule 90 capsule 2    Sig: Take 1 capsule (120 mg total) by mouth daily.   30 day supple to [facility] [place]. Will route to nl refills pt has only 1 pill left

## 2019-05-05 ENCOUNTER — Encounter: Payer: Self-pay | Admitting: Cardiovascular Disease

## 2019-05-05 ENCOUNTER — Other Ambulatory Visit: Payer: Self-pay

## 2019-05-05 ENCOUNTER — Ambulatory Visit: Payer: Medicare Other | Admitting: Cardiovascular Disease

## 2019-05-05 DIAGNOSIS — I2510 Atherosclerotic heart disease of native coronary artery without angina pectoris: Secondary | ICD-10-CM | POA: Diagnosis not present

## 2019-05-05 DIAGNOSIS — I10 Essential (primary) hypertension: Secondary | ICD-10-CM | POA: Diagnosis not present

## 2019-05-05 DIAGNOSIS — E785 Hyperlipidemia, unspecified: Secondary | ICD-10-CM

## 2019-05-05 DIAGNOSIS — I480 Paroxysmal atrial fibrillation: Secondary | ICD-10-CM

## 2019-05-05 NOTE — Assessment & Plan Note (Signed)
History of dyslipidemia on statin therapy with recent lipid profile performed by his PCP 04/17/2019 revealing a total cholesterol 144, LDL of 75 and HDL 53.

## 2019-05-05 NOTE — Progress Notes (Signed)
05/05/2019 [name]   23-Oct-1945  [id number]  Primary Physician Glenda Chroman, MD Primary Cardiologist: Lorretta Harp MD [name], [name]  HPI:  [name] is a 74 y.o.  well-appearing Caucasian male who Ilast spoke to for a virtual telemedicine phone visit 06/20/2018.  He has a history of hypertension and hyperlipidemia. He had atypical chest pain here tone with a negative Myoview4/23/14. His pain improved with proton pump inhibition. Because of recurrent chest pain he saw [name], PA-C in the office who did obtain a 2-D echo and Myoview perfusion study all normal. His pain has improved again on a PPI suggesting that it was related to GERD. Since I saw Mr. Lizak in the office a year ago he has been well until this past Christmas when he was admitted to [facility] with A. fib with RVR. He converted to normal sinus rhythm with IV diltiazem and was converted to by mouth Cardizem. His troponins were negative and 2-D echo was normal. Because of his elevated CHA2DsVASC2 score of 2 he was begun on Eliquis oral anticoagulation. He saw Roderic Palau in the A. fib clinic 04/19/16 because of palpitations. He says he occasionally feels his heart skip or missed AB. She also had a more prolonged episode of PAF lasting 20 seconds. She gave him when necessary diltiazem 30 mg a day for persistently elevated heart rate.He was just seen in the emergency room 07/07/16 by Dr. Ellender Hose. He was complaining of palpitations and was noted to have PVCs.  He was admitted to the hospital and had cardiac cath by myself 02/24/2018 revealing a high-grade mid RCA stenosis. 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 His EF was preserved without inferior wall motion abnormality. He was on "triple therapy" for 1 month after which aspirin was discontinued. He remains on Plavix and Eliquis.  Since I spoke to him on the phone  10 months ago he continues to have done well.  He did see [name], NP in the office 12/11/2018 who added a PPI and gave him some Xanax.  This has completely resolved his epigastric symptoms, currently denying chest pain or shortness of breath.  Current Meds  Medication Sig  . acetaminophen (TYLENOL) 500 MG tablet Take 250 mg by mouth 2 (two) times daily as needed for moderate pain or headache.  Marland Kitchen apixaban (ELIQUIS) 5 MG TABS tablet Take 1 tablet (5 mg total) by mouth 2 (two) times daily.  Marland Kitchen atorvastatin (LIPITOR) 80 MG tablet TAKE 1 TABLET BY MOUTH AT BEDTIME  . clopidogrel (PLAVIX) 75 MG tablet Take 1 tablet (75 mg total) by mouth daily.  Marland Kitchen diltiazem (CARTIA XT) 120 MG 24 hr capsule Take 1 capsule (120 mg total) by mouth daily.  . famotidine (PEPCID) 20 MG tablet Take 1 tablet (20 mg total) by mouth 2 (two) times daily as needed for heartburn or indigestion.  . metoprolol tartrate (LOPRESSOR) 25 MG tablet TAKE 1 TABLET BY MOUTH 2 TIMES A DAY     Allergies  Allergen Reactions  . Lisinopril Swelling    H/o of angioedema   . Chlorthalidone Other (See Comments)    Causes severe hyponatremia  . Flexeril [Cyclobenzaprine]     Unknown reaction  . Hyoscyamine Swelling  . Prevacid [Lansoprazole] Swelling  . Valium [Diazepam] Other (See Comments)    "makes me feel crazy"  . Potassium-Containing Compounds Palpitations    Social History  Socioeconomic History  . Marital status: Married    Spouse name: Not on file  . Number of children: 1  . Years of education: Not on file  . Highest education level: Not on file  Occupational History  . Not on file  Tobacco Use  . Smoking status: Never Smoker  . Smokeless tobacco: Never Used  Substance and Sexual Activity  . Alcohol use: No  . Drug use: No  . Sexual activity: Not on file  Other Topics Concern  . Not on file  Social History Narrative   ** Merged History Encounter **       Social Determinants of Health   Financial  Resource Strain:   . Difficulty of Paying Living Expenses: Not on file  Food Insecurity:   . Worried About Charity fundraiser in the Last Year: Not on file  . Ran Out of Food in the Last Year: Not on file  Transportation Needs:   . Lack of Transportation (Medical): Not on file  . Lack of Transportation (Non-Medical): Not on file  Physical Activity:   . Days of Exercise per Week: Not on file  . Minutes of Exercise per Session: Not on file  Stress:   . Feeling of Stress : Not on file  Social Connections:   . Frequency of Communication with Friends and Family: Not on file  . Frequency of Social Gatherings with Friends and Family: Not on file  . Attends Religious Services: Not on file  . Active Member of Clubs or Organizations: Not on file  . Attends Archivist Meetings: Not on file  . Marital Status: Not on file  Intimate Partner Violence:   . Fear of Current or Ex-Partner: Not on file  . Emotionally Abused: Not on file  . Physically Abused: Not on file  . Sexually Abused: Not on file     Review of Systems: General: negative for chills, fever, night sweats or weight changes.  Cardiovascular: negative for chest pain, dyspnea on exertion, edema, orthopnea, palpitations, paroxysmal nocturnal dyspnea or shortness of breath Dermatological: negative for rash Respiratory: negative for cough or wheezing Urologic: negative for hematuria Abdominal: negative for nausea, vomiting, diarrhea, bright red blood per rectum, melena, or hematemesis Neurologic: negative for visual changes, syncope, or dizziness All other systems reviewed and are otherwise negative except as noted above.    Blood pressure (!) 142/88, pulse 70, height 5\' 8"  (1.727 m), weight 150 lb 6.4 oz (68.2 kg).  General appearance: alert and no distress Neck: no adenopathy, no carotid bruit, no JVD, supple, symmetrical, trachea midline and thyroid not enlarged, symmetric, no tenderness/mass/nodules Lungs: clear to  auscultation bilaterally Heart: regular rate and rhythm, S1, S2 normal, no murmur, click, rub or gallop Extremities: extremities normal, atraumatic, no cyanosis or edema Pulses: 2+ and symmetric Skin: Skin color, texture, turgor normal. No rashes or lesions Neurologic: Alert and oriented X 3, normal strength and tone. Normal symmetric reflexes. Normal coordination and gait  EKG not performed today  ASSESSMENT AND PLAN:   Dyslipidemia History of dyslipidemia on statin therapy with recent lipid profile performed by his PCP 04/17/2019 revealing a total cholesterol 144, LDL of 75 and HDL 53.  Essential hypertension History of essential hypertension with blood pressure measured today 142/88.  He is on metoprolol and diltiazem.  PAF (paroxysmal atrial fibrillation) (HCC) History of PAF maintaining sinus rhythm on Eliquis oral anticoagulation.  CAD (coronary artery disease) History of CAD status post cardiac catheterization by myself 02/24/2018 revealing  a high-grade mid RCA stenosis.  He has significant dissection with initial initial balloon predilatation requiring 4 overlapping synergy drug-eluting stents.  He had no other significant CAD.  His EF was preserved.  He remains on clopidogrel.  He has had no recurrent chest pain.  He was having some epigastric pain which resolved with the addition of a PPI.      Lorretta Harp MD FACP,FACC,FAHA, [facility] 05/05/2019 11:39 AM

## 2019-05-05 NOTE — Patient Instructions (Signed)
Medication Instructions:  Your physician recommends that you continue on your current medications as directed. Please refer to the Current Medication list given to you today.  If you need a refill on your cardiac medications before your next appointment, please call your pharmacy.   Lab work: NONE  Testing/Procedures: NONE  Follow-Up: At [facility], you and your health needs are our priority.  As part of our continuing mission to provide you with exceptional heart care, we have created designated Provider Care Teams.  These Care Teams include your primary Cardiologist (physician) and Advanced Practice Providers (APPs -  Physician Assistants and Nurse Practitioners) who all work together to provide you with the care you need, when you need it. You may see Quay Burow, MD or one of the following Advanced Practice Providers on your designated Care Team:    Kerin Ransom, PA-C  [place], [place]  Coletta Memos, [place]  Your physician wants you to follow-up in: 6 months with [name], NP and 1 year with Dr. [name]. You will receive a reminder letter in the mail two months in advance. If you don't receive a letter, please call our office to schedule the follow-up appointment.

## 2019-05-05 NOTE — Assessment & Plan Note (Signed)
History of CAD status post cardiac catheterization by myself 02/24/2018 revealing a high-grade mid RCA stenosis.  He has significant dissection with initial initial balloon predilatation requiring 4 overlapping synergy drug-eluting stents.  He had no other significant CAD.  His EF was preserved.  He remains on clopidogrel.  He has had no recurrent chest pain.  He was having some epigastric pain which resolved with the addition of a PPI.

## 2019-05-05 NOTE — Assessment & Plan Note (Signed)
History of PAF maintaining sinus rhythm on Eliquis oral anticoagulation. 

## 2019-05-05 NOTE — Assessment & Plan Note (Signed)
History of essential hypertension with blood pressure measured today 142/88.  He is on metoprolol and diltiazem.

## 2019-05-12 ENCOUNTER — Encounter (INDEPENDENT_AMBULATORY_CARE_PROVIDER_SITE_OTHER): Payer: Self-pay | Admitting: Internal Medicine

## 2019-05-12 ENCOUNTER — Ambulatory Visit (INDEPENDENT_AMBULATORY_CARE_PROVIDER_SITE_OTHER): Payer: Medicare Other | Admitting: Internal Medicine

## 2019-05-12 ENCOUNTER — Other Ambulatory Visit: Payer: Self-pay

## 2019-05-12 VITALS — BP 159/77 | HR 61 | Temp 97.3°F | Ht 68.0 in | Wt 147.4 lb

## 2019-05-12 DIAGNOSIS — R140 Abdominal distension (gaseous): Secondary | ICD-10-CM

## 2019-05-12 DIAGNOSIS — R1013 Epigastric pain: Secondary | ICD-10-CM

## 2019-05-12 DIAGNOSIS — K219 Gastro-esophageal reflux disease without esophagitis: Secondary | ICD-10-CM | POA: Diagnosis not present

## 2019-05-12 MED ORDER — PANTOPRAZOLE SODIUM 40 MG PO TBEC: 40.0000 mg | DELAYED_RELEASE_TABLET | ORAL | 3 refills | Status: DC

## 2019-05-12 MED ORDER — SIMETHICONE 180 MG PO CAPS: 180.0000 mg | ORAL_CAPSULE | Freq: Three times a day (TID) | ORAL | 0 refills | Status: DC | PRN

## 2019-05-12 NOTE — Progress Notes (Signed)
Presenting complaint;  Follow-up for GERD epigastric pain and bloating.  Database and subjective:  Patient is 74 year old Caucasian male who has history of GERD peptic ulcer disease who is here for scheduled visit.  He was last seen 1 year ago.  He complains of intermittent bloating.  He states he is trying to eat healthy.  He eats a bowl of raisin Bran every day.  He feels defecation has improved.  He denies melena or rectal bleeding.  He remains with good appetite and his weight has been stable.  However he continues to complain of discomfort in epigastrium and pressure like sensation which extends to his upper chest anteriorly below the clavicles.  He wonders if this symptoms it caused by gas.  He also wonders if he can try doubling pantoprazole dose.  However he is not having heartburn often.  He walks 30 min 3-5 times a week.  He works part-time.  He says he generally works 25 to 30 h every week.  He saw Dr. Alvester Chou 1 week ago.  He is wondering if he needs to continue Plavix and Eliquis.  Patient forgot to ask Dr. Alvester Chou this question. He also wonders if anxiety is causing the symptoms.  He has prescription for alprazolam but he has not tried it yet.   Current Medications: Outpatient Encounter Medications as of 05/12/2019  Medication Sig  . acetaminophen (TYLENOL) 500 MG tablet Take 250 mg by mouth 2 (two) times daily as needed for moderate pain or headache.  . ALPRAZolam (XANAX) 0.5 MG tablet Take 0.5 mg by mouth 2 (two) times daily as needed for anxiety.  Marland Kitchen apixaban (ELIQUIS) 5 MG TABS tablet Take 1 tablet (5 mg total) by mouth 2 (two) times daily.  Marland Kitchen atorvastatin (LIPITOR) 80 MG tablet TAKE 1 TABLET BY MOUTH AT BEDTIME  . clopidogrel (PLAVIX) 75 MG tablet Take 1 tablet (75 mg total) by mouth daily.  Marland Kitchen diltiazem (CARTIA XT) 120 MG 24 hr capsule Take 1 capsule (120 mg total) by mouth daily.  . isosorbide mononitrate (IMDUR) 30 MG 24 hr tablet Take 1 tablet (30 mg total) by mouth daily.  .  metoprolol tartrate (LOPRESSOR) 25 MG tablet TAKE 1 TABLET BY MOUTH 2 TIMES A DAY  . pantoprazole (PROTONIX) 40 MG tablet Take 40 mg by mouth daily.  . famotidine (PEPCID) 20 MG tablet Take 1 tablet (20 mg total) by mouth 2 (two) times daily as needed for heartburn or indigestion. (Patient not taking: Reported on 05/12/2019)   No facility-administered encounter medications on file as of 05/12/2019.    Objective: Blood pressure (!) 159/77, pulse 61, temperature (!) 97.3 F (36.3 C), temperature source Temporal, height 5\' 8"  (1.727 m), weight 147 lb 6.4 oz (66.9 kg). Patient is alert and in no acute distress. He is wearing facial mask. Conjunctiva is pink. Sclera is nonicteric Oropharyngeal mucosa is normal. No neck masses or thyromegaly noted. Cardiac exam with regular rhythm normal S1 and S2. No murmur or gallop noted. Lungs are clear to auscultation. Abdomen is symmetrical soft and nontender without organomegaly or masses. No LE edema or clubbing noted.   Assessment:  #1.  GERD.  Patient is on pantoprazole.  Dietary habits appear to be adequate.  I do not believe doubling the dose of pantoprazole necessarily would help his abdominal and chest symptoms.  In fact pantoprazole can result in bloating and some patients.  Therefore decreasing dose to every other day may be more beneficial.  #2.  Epigastric pain.  He  had EGD in Jul 2017.  He did not have peptic ulcer disease or H. pylori gastritis.  This pain may well be due to anxiety or spasm.  #3.  Bloating.  He does not have alarm symptoms.  I wonder if he swallows too much here because he is so anxious.   Plan:  Patient advised to try pantoprazole every other day.  If he starts to have heartburn he can go back to taking it daily otherwise stay on every other day schedule.  Will change his prescription at next refill. He may consider alprazolam as recommended by Dr. Woody Seller and see if it helps with his abdominal and chest symptoms.  He  already has prescription filled. He can also try Phazyme 180 mg 3 times a day for 1 week and thereafter on as-needed basis if it helps. Patient will call if his symptoms worsen. Office visit in 1 year.

## 2019-05-12 NOTE — Patient Instructions (Signed)
Can try taking pantoprazole every other day.  If you start to experience heartburn on off days she can go back on it. Take alprazolam as recommended by her physician and if it does not help with abdominal or chest pain then try Phazyme 180 mg 3 times a day for 1 week and if it helps then use on as-needed basis.

## 2019-05-16 ENCOUNTER — Telehealth: Payer: Self-pay | Admitting: Cardiology

## 2019-05-16 ENCOUNTER — Encounter (HOSPITAL_COMMUNITY): Payer: Self-pay | Admitting: Emergency Medicine

## 2019-05-16 ENCOUNTER — Emergency Department (HOSPITAL_COMMUNITY)
Admission: EM | Admit: 2019-05-16 | Discharge: 2019-05-16 | Disposition: A | Discharge status: Home / Self Care | Payer: Medicare Other | Attending: Emergency Medicine | Admitting: Emergency Medicine

## 2019-05-16 ENCOUNTER — Emergency Department (HOSPITAL_COMMUNITY): Payer: Medicare Other

## 2019-05-16 DIAGNOSIS — I4891 Unspecified atrial fibrillation: Secondary | ICD-10-CM | POA: Diagnosis not present

## 2019-05-16 DIAGNOSIS — Z8547 Personal history of malignant neoplasm of testis: Secondary | ICD-10-CM | POA: Diagnosis not present

## 2019-05-16 DIAGNOSIS — I252 Old myocardial infarction: Secondary | ICD-10-CM | POA: Diagnosis not present

## 2019-05-16 DIAGNOSIS — I10 Essential (primary) hypertension: Secondary | ICD-10-CM | POA: Insufficient documentation

## 2019-05-16 DIAGNOSIS — Z79899 Other long term (current) drug therapy: Secondary | ICD-10-CM | POA: Insufficient documentation

## 2019-05-16 DIAGNOSIS — R0789 Other chest pain: Secondary | ICD-10-CM | POA: Insufficient documentation

## 2019-05-16 DIAGNOSIS — Z7901 Long term (current) use of anticoagulants: Secondary | ICD-10-CM | POA: Insufficient documentation

## 2019-05-16 DIAGNOSIS — R10816 Epigastric abdominal tenderness: Secondary | ICD-10-CM | POA: Insufficient documentation

## 2019-05-16 LAB — HEPATIC FUNCTION PANEL
ALT: 20 U/L (ref 0–44)
AST: 22 U/L (ref 15–41)
Albumin: 3.7 g/dL (ref 3.5–5.0)
Alkaline Phosphatase: 90 U/L (ref 38–126)
Bilirubin, Direct: 0.1 mg/dL (ref 0.0–0.2)
Indirect Bilirubin: 0.5 mg/dL (ref 0.3–0.9)
Total Bilirubin: 0.6 mg/dL (ref 0.3–1.2)
Total Protein: 7.3 g/dL (ref 6.5–8.1)

## 2019-05-16 LAB — CBC
HCT: 38.8 % — ABNORMAL LOW (ref 39.0–52.0)
Hemoglobin: 12.5 g/dL — ABNORMAL LOW (ref 13.0–17.0)
MCH: 29.1 pg (ref 26.0–34.0)
MCHC: 32.2 g/dL (ref 30.0–36.0)
MCV: 90.4 fL (ref 80.0–100.0)
Platelets: 286 10*3/uL (ref 150–400)
RBC: 4.29 MIL/uL (ref 4.22–5.81)
RDW: 12.6 % (ref 11.5–15.5)
WBC: 7 10*3/uL (ref 4.0–10.5)
nRBC: 0 % (ref 0.0–0.2)

## 2019-05-16 LAB — BASIC METABOLIC PANEL
Anion gap: 10 (ref 5–15)
BUN: 12 mg/dL (ref 8–23)
CO2: 23 mmol/L (ref 22–32)
Calcium: 9 mg/dL (ref 8.9–10.3)
Chloride: 104 mmol/L (ref 98–111)
Creatinine, Ser: 0.89 mg/dL (ref 0.61–1.24)
GFR calc Af Amer: >60 mL/min (ref >60)
GFR calc non Af Amer: >60 mL/min (ref >60)
Glucose, Bld: 108 mg/dL — ABNORMAL HIGH (ref 70–99)
Potassium: 4.4 mmol/L (ref 3.5–5.1)
Sodium: 137 mmol/L (ref 135–145)

## 2019-05-16 LAB — TROPONIN I (HIGH SENSITIVITY)
Troponin I (High Sensitivity): 4 ng/L (ref <18)
Troponin I (High Sensitivity): 5 ng/L (ref <18)

## 2019-05-16 LAB — LIPASE, BLOOD: Lipase: 27 U/L (ref 11–51)

## 2019-05-16 MED ORDER — SODIUM CHLORIDE 0.9% FLUSH: 3.0000 mL | Freq: Once | INTRAVENOUS | Status: DC

## 2019-05-16 MED ORDER — AMLODIPINE BESYLATE 2.5 MG PO TABS: 2.5000 mg | ORAL_TABLET | Freq: Every day | ORAL | 0 refills | Status: DC

## 2019-05-16 MED ORDER — ALUM & MAG HYDROXIDE-SIMETH 200-200-20 MG/5ML PO SUSP
30.0000 mL | Freq: Once | ORAL | Status: AC
  Administered 2019-05-16: 14:00:00 30 mL via ORAL
  Filled 2019-05-16: qty 30

## 2019-05-16 MED ORDER — SUCRALFATE 1 GM/10ML PO SUSP
1.0000 g | Freq: Three times a day (TID) | ORAL | Status: DC
  Administered 2019-05-16: 17:00:00 1 g via ORAL
  Filled 2019-05-16 (×3): qty 10

## 2019-05-16 MED ORDER — ASPIRIN 81 MG PO CHEW
324.0000 mg | CHEWABLE_TABLET | Freq: Once | ORAL | Status: AC
  Administered 2019-05-16: 14:00:00 324 mg via ORAL
  Filled 2019-05-16: qty 4

## 2019-05-16 MED ORDER — FAMOTIDINE 20 MG PO TABS
20.0000 mg | ORAL_TABLET | Freq: Once | ORAL | Status: AC
  Administered 2019-05-16: 20 mg via ORAL
  Filled 2019-05-16: qty 1

## 2019-05-16 NOTE — ED Provider Notes (Signed)
[facility] Provider Note   CSN: [id number] Arrival date & time: 05/16/19  1303     History Chief Complaint  Patient presents with  . Chest Pain    [name] is a 74 y.o. male.  HPI   74 year old male with a history of atrial fibrillation (on Eliquis), testicular cancer, GERD, hyperlipidemia, hypertension, CAD, who presents to the ED for eval of chest pain that started this morning around 8-9 AM after eating raison bran for breakfast. Pain located to the middle of the chest and does not radiate. Pain has been waxing and waning. Pain currently rated 5-6/10. States it feels like a "big bubble" in his chest and like he needs to belch. Pain not worse with exertion. Denies associated diaphoresis, nausea, lightheadedness, shortness of breath, pain with inspiration. Denies abd pain.   He has been compliant with his protonix but that did not improve his symptoms today. He tried taking a xanax today, but it also did not change his symptoms.   Reviewed note from patient'Parker visit with cardiology, Dr. Alvester Chou on 05/05/2019.  Note stated that patient had cardiac cath on 02/24/2018 revealing high-grade mid RCA stenosis.  He has dissection present with initial balloon predilatation requiring 4 overlapping synergy drug-eluting stents.  Past Medical History:  Diagnosis Date  . Anxiety   . Atrial fibrillation ([place])   . Cancer ([place])    testicular   . Chest pain    myoview 07/09/12-normal, nl ef; echo 04/12/05-ef>55%, mild aortic sclerosis  . GERD (gastroesophageal reflux disease)   . H. pylori infection   . Headache   . Hypercholesteremia   . Hyperlipidemia   . Hypertension   . Hyponatremia 2013  . Palpitations    event monior 04/11/05- SR with occ PAC  . Personal history of colonic polyps 2 9 2007    Patient Active Problem List   Diagnosis Date Noted  . Bloating 05/12/2019  . Non-ST elevation (NSTEMI) myocardial infarction ([place])   . Dissection of  coronary artery as complication of coronary intervention procedure   . CAD (coronary artery disease) 02/24/2018  . Family history of coronary artery disease in father 02/19/2018  . Chronic anticoagulation 02/18/2018  . PAF (paroxysmal atrial fibrillation) ([place]) 03/13/2016  . Hx of colonic polyps 12/15/2015  . Palpitations 02/25/2015  . Lumbago 01/01/2013  . Unstable angina ([place]) 08/08/2012  . Essential hypertension 08/08/2012  . Diarrhea 10/23/2010  . Epigastric pain 10/23/2010  . Anxiety   . Dyslipidemia   . GERD (gastroesophageal reflux disease)   . COLONIC POLYPS 11/23/2008  . IBS 11/23/2008  . LOSS OF APPETITE 11/23/2008  . EPIGASTRIC PAIN 11/23/2008  . HEARTBURN, HX OF 11/23/2008    Past Surgical History:  Procedure Laterality Date  . BACK SURGERY    . CATARACT EXTRACTION W/PHACO Left 12/08/2012   Procedure: CATARACT EXTRACTION PHACO AND INTRAOCULAR LENS PLACEMENT (IOC);  Surgeon: Tonny Branch, MD;  Location: [facility];  Service: Ophthalmology;  Laterality: Left;  CDE:  23.19  . CATARACT EXTRACTION W/PHACO Right 04/15/2014   Procedure: CATARACT EXTRACTION PHACO AND INTRAOCULAR LENS PLACEMENT RIGHT;  Surgeon: Tonny Branch, MD;  Location: [facility];  Service: Ophthalmology;  Laterality: Right;  CDE:10.32  . COLONOSCOPY  12/06/2010   Procedure: COLONOSCOPY;  Surgeon: Rogene Houston, MD;  Location: [facility];  Service: Endoscopy;  Laterality: N/A;  . COLONOSCOPY N/A 05/23/2016   Procedure: COLONOSCOPY;  Surgeon: Rogene Houston, MD;  Location: [facility];  Service: Endoscopy;  Laterality: N/A;  930  . CORONARY STENT INTERVENTION N/A 02/24/2018   Procedure: CORONARY STENT INTERVENTION;  Surgeon: Lorretta Harp, MD;  Location: [facility];  Service: Cardiovascular;  Laterality: N/A;  . ESOPHAGOGASTRODUODENOSCOPY N/A 07/20/2015   Procedure: ESOPHAGOGASTRODUODENOSCOPY (EGD);  Surgeon: Rogene Houston, MD;  Location: [facility];  Service: Endoscopy;  Laterality: N/A;  2:55 - moved  to 1:55 - Ann notified pt  . EYE SURGERY    . LEFT HEART CATH AND CORONARY ANGIOGRAPHY N/A 02/24/2018   Procedure: LEFT HEART CATH AND CORONARY ANGIOGRAPHY;  Surgeon: Lorretta Harp, MD;  Location: [facility];  Service: Cardiovascular;  Laterality: N/A;  . LUMBAR DISC SURGERY Left 2007   "L4-5 ruptured discs"  . POLYPECTOMY  05/23/2016   Procedure: POLYPECTOMY;  Surgeon: Rogene Houston, MD;  Location: [facility];  Service: Endoscopy;;  colon       Family History  Problem Relation Age of Onset  . Hypertension Father   . Hyperlipidemia Father   . CAD Father        H/O CABG    Social History   Tobacco Use  . Smoking status: Never Smoker  . Smokeless tobacco: Never Used  Substance Use Topics  . Alcohol use: No  . Drug use: No    Home Medications Prior to Admission medications   Medication Sig Start Date End Date Taking? Authorizing Provider  acetaminophen (TYLENOL) 500 MG tablet Take 250 mg by mouth 2 (two) times daily as needed for moderate pain or headache.   Yes [provider]  ALPRAZolam Duanne Moron) 0.5 MG tablet Take 0.5 mg by mouth 2 (two) times daily as needed for anxiety.   Yes [provider]  apixaban (ELIQUIS) 5 MG TABS tablet Take 1 tablet (5 mg total) by mouth 2 (two) times daily. 12/11/18  Yes Lendon Colonel, NP  atorvastatin (LIPITOR) 80 MG tablet TAKE 1 TABLET BY MOUTH AT BEDTIME Patient taking differently: Take 80 mg by mouth at bedtime.  02/17/19  Yes Lorretta Harp, MD  clopidogrel (PLAVIX) 75 MG tablet Take 1 tablet (75 mg total) by mouth daily. 12/11/18  Yes Lendon Colonel, NP  diltiazem (CARTIA XT) 120 MG 24 hr capsule Take 1 capsule (120 mg total) by mouth daily. 03/25/19  Yes Lendon Colonel, NP  isosorbide mononitrate (IMDUR) 30 MG 24 hr tablet Take 1 tablet (30 mg total) by mouth daily. 12/11/18 05/16/19 Yes Lendon Colonel, NP  metoprolol tartrate (LOPRESSOR) 25 MG tablet TAKE 1 TABLET BY MOUTH 2 TIMES A  DAY Patient taking differently: Take 25 mg by mouth 2 (two) times daily.  07/08/18  Yes Lorretta Harp, MD  pantoprazole (PROTONIX) 40 MG tablet Take 1 tablet (40 mg total) by mouth every other day. 05/12/19  Yes Rehman, Mechele Dawley, MD  predniSONE (DELTASONE) 10 MG tablet Take 10 mg by mouth daily as needed. 05/07/19  Yes [provider]  Simethicone 180 MG CAPS Take 1 capsule (180 mg total) by mouth 3 (three) times daily as needed. 05/12/19  Yes Rehman, Mechele Dawley, MD  amLODipine (NORVASC) 2.5 MG tablet Take 1 tablet (2.5 mg total) by mouth daily. 05/16/19   Jeannine Pennisi S, PA-C    Allergies    Lisinopril, Chlorthalidone, Flexeril [cyclobenzaprine], Hyoscyamine, Prevacid [lansoprazole], Valium [diazepam], and Potassium-containing compounds  Review of Systems   Review of Systems  Constitutional: Negative for chills and fever.  HENT: Negative for ear pain and sore throat.  Eyes: Negative for visual disturbance.  Respiratory: Negative for cough and shortness of breath.   Cardiovascular: Positive for chest pain.  Gastrointestinal: Positive for abdominal pain. Negative for constipation, diarrhea, nausea and vomiting.  Genitourinary: Negative for dysuria and hematuria.  Musculoskeletal: Negative for back pain.  Skin: Negative for color change and rash.  Neurological: Negative for headaches.  All other systems reviewed and are negative.   Physical Exam Updated Vital Signs BP (!) 174/78 (BP Location: Right Arm)   Pulse 63   Temp 98.3 F (36.8 C) (Oral)   Resp 16   Ht 5\' 8"  (1.727 m)   Wt 68 kg   SpO2 98%   BMI 22.81 kg/m   Physical Exam Vitals and nursing note reviewed.  Constitutional:      Appearance: He is well-developed.  HENT:     Head: Normocephalic and atraumatic.  Eyes:     Conjunctiva/sclera: Conjunctivae normal.  Cardiovascular:     Rate and Rhythm: Normal rate and regular rhythm.     Heart sounds: No murmur.  Pulmonary:     Effort: Pulmonary effort is  normal. No respiratory distress.     Breath sounds: Normal breath sounds. No decreased breath sounds, wheezing, rhonchi or rales.  Chest:     Chest wall: No tenderness.  Abdominal:     General: Bowel sounds are normal.     Palpations: Abdomen is soft.     Tenderness: There is abdominal tenderness (mild) in the epigastric area. There is no guarding or rebound.  Musculoskeletal:     Cervical back: Neck supple.  Skin:    General: Skin is warm and dry.  Neurological:     Mental Status: He is alert.     ED Results / Procedures / Treatments   Labs (all labs ordered are listed, but only abnormal results are displayed) Labs Reviewed  BASIC METABOLIC PANEL - Abnormal; Notable for the following components:      Result Value   Glucose, Bld 108 (*)    All other components within normal limits  CBC - Abnormal; Notable for the following components:   Hemoglobin 12.5 (*)    HCT 38.8 (*)    All other components within normal limits  HEPATIC FUNCTION PANEL  LIPASE, BLOOD  TROPONIN I (HIGH SENSITIVITY)  TROPONIN I (HIGH SENSITIVITY)    EKG EKG Interpretation  Date/Time:  Saturday May 16 2019 13:39:01 EST Ventricular Rate:  60 PR Interval:  174 QRS Duration: 124 QT Interval:  470 QTC Calculation: 470 R Axis:   82 Text Interpretation: Sinus rhythm Ventricular premature complex Short PR interval Right bundle branch block since last tracing no significant change Confirmed by Malvin Johns [phone]) on 05/16/2019 1:41:37 PM   Radiology DG Chest 2 View  Result Date: 05/16/2019 CLINICAL DATA:  Chest pain for 4 days. The common more intense this morning. EXAM: CHEST - 2 VIEW COMPARISON:  None. FINDINGS: The heart size and mediastinal contours are within normal limits. Both lungs are clear. The visualized skeletal structures are unremarkable. IMPRESSION: No active cardiopulmonary disease. Electronically Signed   By: Kathreen Devoid   On: 05/16/2019 14:28    Procedures Procedures  (including critical care time)  Medications Ordered in ED Medications  sodium chloride flush (NS) 0.9 % injection 3 mL (3 mLs Intravenous Not Given 05/16/19 1331)  sucralfate (CARAFATE) 1 GM/10ML suspension 1 g (1 g Oral Given 05/16/19 1721)  famotidine (PEPCID) tablet 20 mg (20 mg Oral Given 05/16/19 1423)  aspirin chewable tablet  324 mg (324 mg Oral Given 05/16/19 1424)  alum & mag hydroxide-simeth (MAALOX/MYLANTA) 200-200-20 MG/5ML suspension 30 mL (30 mLs Oral Given 05/16/19 1424)    ED Course  I have reviewed the triage vital signs and the nursing notes.  Pertinent labs & imaging results that were available during my care of the patient were reviewed by me and considered in my medical decision making (see chart for details).    MDM Rules/Calculators/A&P                      74 year old male presenting for evaluation of chest pain.  Interpreted labs CBC without leukocytosis, anemia present but stable BMP nonacute Liver enzymes normal Lipase normal Trop neg x2  EKG Normal sinus rhythm Incomplete right bundle branch block Borderline ECG since last tracing no significant change   CXR is is without acute abnormality  4:47 PM CONSULT With Dr. Harrington Challenger with cardiology who reviewed the patient'Parker chart.  She does feel that the patient is appropriate for close outpatient follow-up with cardiology.  She recommends adding amlodipine 2.5 mg as the patient has had several elevated blood pressure readings.  She recommends that the patient continue to follow-up with his gastroenterologist.  [name] patient.  He states he feels somewhat better after the medications given in the ED.  On reassessment he still does have some mild epigastric tenderness.  No tenderness to the right upper quadrant.  He is well-appearing and in no acute distress.  His symptoms seem atypical for ACS.  I doubt other emergent cardiac/pulmonary or intra-abdominal cause at this time.  Advised him to continue his Protonix and  other medications recommended by his GI doctor.  Recommended that he call his cardiologist to schedule appointment for follow-up and return to the ED for new or worsening symptoms.  He voiced understanding plan reasons to return.  All questions answered.  Patient stable for discharge   Final Clinical Impression(Parker) / ED Diagnoses Final diagnoses:  Atypical chest pain    Rx / DC Orders ED Discharge Orders         Ordered    amLODipine (NORVASC) 2.5 MG tablet  Daily     05/16/19 [address] Miguel Ziomek S, PA-C 05/16/19 1733    Malvin Johns, MD 05/16/19 2002

## 2019-05-16 NOTE — ED Triage Notes (Signed)
Pt. Stated, Ive had CP this morning, but its been bother me for over 3 weeks.

## 2019-05-16 NOTE — ED Notes (Signed)
Patient verbalizes understanding of discharge instructions. Opportunity for questioning and answers were provided. Armband removed by staff, pt discharged from ED.  

## 2019-05-16 NOTE — Telephone Encounter (Signed)
pt with ongoing chest pain, most of RCA is stented, pain will not go away.  Headed to ER, triage notified.

## 2019-05-16 NOTE — Discharge Instructions (Signed)
Please take the blood pressure medication as directed.  You should continue taking your Protonix as directed by your GI doctor.  I also recommend starting to take Phazyme as directed by your GI doctor.   You should call your cardiologist on Monday to schedule an appointment for follow-up next week.  Monitor your symptoms closely.  If your symptoms recur or worsen then please return to the emergency department immediately.

## 2019-05-18 ENCOUNTER — Telehealth: Payer: Self-pay | Admitting: Cardiovascular Disease

## 2019-05-18 NOTE — Telephone Encounter (Signed)
Patient went to ER over the weekend, Dr. [name] advised patient to be seen with him for hosp f/u. Scheduled patient for 05/27/19 but wife states that her husband still does not feel good. Patient was willing to see [name] or Kerin Ransom if not Dr. [name], only virtual appointments were available with the PA's and patient wants to come in. Please advise if patient needs to be seen sooner that 05/27/19 with Dr. [name].

## 2019-05-18 NOTE — Telephone Encounter (Signed)
Called pt wife back - per DPR. Moved pt appt up 3/3 at 11:00am. Pt stated that he went to ER for chest pain - stated that it was the worst chest pain he had - pt stated that he is not currently having pain - advised pt to call 911 or go back to ER if chest pain returns.

## 2019-05-20 ENCOUNTER — Ambulatory Visit: Payer: Medicare Other | Admitting: Cardiovascular Disease

## 2019-05-20 ENCOUNTER — Other Ambulatory Visit: Payer: Self-pay

## 2019-05-20 ENCOUNTER — Encounter: Payer: Self-pay | Admitting: Cardiovascular Disease

## 2019-05-20 DIAGNOSIS — I25118 Atherosclerotic heart disease of native coronary artery with other forms of angina pectoris: Secondary | ICD-10-CM

## 2019-05-20 DIAGNOSIS — R072 Precordial pain: Secondary | ICD-10-CM | POA: Diagnosis not present

## 2019-05-20 DIAGNOSIS — I10 Essential (primary) hypertension: Secondary | ICD-10-CM | POA: Diagnosis not present

## 2019-05-20 MED ORDER — AMLODIPINE BESYLATE 5 MG PO TABS: 5.0000 mg | ORAL_TABLET | Freq: Every day | ORAL | 3 refills | Status: DC

## 2019-05-20 NOTE — Assessment & Plan Note (Signed)
History of CAD status post RCA stenting by myself 02/24/2018 resulting in significant dissection requiring placement of 4 overlapping drug-eluting stents.  His EF was preserved.  He is on Plavix in addition to Eliquis for his PAF.  He started developed some atypical chest pain or less several months and was recently seen in the emergency room 05/16/2019 with epigastric pain while waiting to be seated at [employer].  He was seen in the ER.  His EKG showed no acute changes.  His enzymes were negative and he was discharged home.  He is on Protonix 40 mg a day.  I am going to get a Lexiscan Myoview stress test to further evaluate.

## 2019-05-20 NOTE — Patient Instructions (Signed)
Medication Instructions:  Increase Amlodipine to 5mg  Daily  If you need a refill on your cardiac medications before your next appointment, please call your pharmacy.   Lab work: NONE  Testing/Procedures: Your physician has requested that you have a lexiscan myoview. For further information please visit [email or web address]. Please follow instruction sheet, as given.  Follow-Up: At [facility], you and your health needs are our priority.  As part of our continuing mission to provide you with exceptional heart care, we have created designated Provider Care Teams.  These Care Teams include your primary Cardiologist (physician) and Advanced Practice Providers (APPs -  Physician Assistants and Nurse Practitioners) who all work together to provide you with the care you need, when you need it. You may see Quay Burow, MD or one of the following Advanced Practice Providers on your designated Care Team:    Kerin Ransom, PA-C  [place], [place]  Coletta Memos, [place]  Your physician wants you to follow-up in: 6 months with [name], NP and 1 year with Dr. [name]. You will receive a reminder letter in the mail two months in advance. If you don't receive a letter, please call our office to schedule the follow-up appointment.

## 2019-05-20 NOTE — Progress Notes (Signed)
05/20/2019 CROSS GOLDBECK   17-Jan-1946  [id number]  Primary Physician Miguel Chroman, MD Primary Cardiologist: Miguel Harp MD [name], [place], [name]  HPI:  [name] is a 74 y.o.  well-appearing Caucasian male who Ilast  saw him in the office 06/02/2019. He has a history of hypertension and hyperlipidemia. He had atypical chest pain here tone with a negative Myoview4/23/14. His pain improved with proton pump inhibition. Because of recurrent chest pain he saw Miguel Henri, PA-C in the office who did obtain a 2-D echo and Myoview perfusion study all normal. His pain has improved again on a PPI suggesting that it was related to GERD. Since I saw [name] in the office a year ago he has been well until this past Christmas when he was admitted to [facility] with A. fib with RVR. He converted to normal sinus rhythm with IV diltiazem and was converted to by mouth Cardizem. His troponins were negative and 2-D echo was normal. Because of his elevated CHA2DsVASC2 score of 2 he was begun on Eliquis oral anticoagulation. He saw [name] in the A. fib clinic 04/19/16 because of palpitations. He says he occasionally feels his heart skip or missed AB. She also had a more prolonged episode of PAF lasting 20 seconds. She gave him when necessary diltiazem 30 mg a day for persistently elevated heart rate.He was just seen in the emergency room 07/07/16 by [name]. He was complaining of palpitations and was noted to have PVCs.  Hewas admitted to the hospital and had cardiac cath by myself 02/24/2018 revealing a high-grade mid RCA stenosis. 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 His EF was preserved without inferior wall motion abnormality. He was on "triple therapy" for 1 month after which aspirin was discontinued. He remains on Plavix and Eliquis.    He did see Miguel Sims, NP in the office 12/11/2018  who added a PPI and gave him some Xanax.  He was seen in the ER 05/16/2019 with epigastric pain.  His EKG showed no acute changes.  His troponins were negative x2.  He does get occasional episodes of epigastric pain on Protonix.  These episodes began several months ago.  Current Meds  Medication Sig  . acetaminophen (TYLENOL) 500 MG tablet Take 250 mg by mouth 2 (two) times daily as needed for moderate pain or headache.  . ALPRAZolam (XANAX) 0.5 MG tablet Take 0.5 mg by mouth 2 (two) times daily as needed for anxiety.  Marland Kitchen amLODipine (NORVASC) 5 MG tablet Take 1 tablet (5 mg total) by mouth daily.  Marland Kitchen apixaban (ELIQUIS) 5 MG TABS tablet Take 1 tablet (5 mg total) by mouth 2 (two) times daily.  Marland Kitchen atorvastatin (LIPITOR) 80 MG tablet TAKE 1 TABLET BY MOUTH AT BEDTIME (Patient taking differently: Take 80 mg by mouth at bedtime. )  . clopidogrel (PLAVIX) 75 MG tablet Take 1 tablet (75 mg total) by mouth daily.  Marland Kitchen diltiazem (CARTIA XT) 120 MG 24 hr capsule Take 1 capsule (120 mg total) by mouth daily.  . metoprolol tartrate (LOPRESSOR) 25 MG tablet TAKE 1 TABLET BY MOUTH 2 TIMES A DAY (Patient taking differently: Take 25 mg by mouth 2 (two) times daily. )  . pantoprazole (PROTONIX) 40 MG tablet Take 1 tablet (40 mg total) by mouth every other day.  . predniSONE (DELTASONE) 10 MG tablet Take 10 mg by mouth daily as needed.  Marland Kitchen  Simethicone 180 MG CAPS Take 1 capsule (180 mg total) by mouth 3 (three) times daily as needed.  . [DISCONTINUED] amLODipine (NORVASC) 2.5 MG tablet Take 1 tablet (2.5 mg total) by mouth daily.     Allergies  Allergen Reactions  . Lisinopril Swelling    H/o of angioedema   . Chlorthalidone Other (See Comments)    Causes severe hyponatremia  . Flexeril [Cyclobenzaprine]     Unknown reaction  . Hyoscyamine Swelling  . Prevacid [Lansoprazole] Swelling  . Valium [Diazepam] Other (See Comments)    "makes me feel crazy"  . Potassium-Containing Compounds Palpitations    Social  History   Socioeconomic History  . Marital status: Married    Spouse name: Not on file  . Number of children: 1  . Years of education: Not on file  . Highest education level: Not on file  Occupational History  . Not on file  Tobacco Use  . Smoking status: Never Smoker  . Smokeless tobacco: Never Used  Substance and Sexual Activity  . Alcohol use: No  . Drug use: No  . Sexual activity: Not on file  Other Topics Concern  . Not on file  Social History Narrative   ** Merged History Encounter **       Social Determinants of Health   Financial Resource Strain:   . Difficulty of Paying Living Expenses: Not on file  Food Insecurity:   . Worried About Charity fundraiser in the Last Year: Not on file  . Ran Out of Food in the Last Year: Not on file  Transportation Needs:   . Lack of Transportation (Medical): Not on file  . Lack of Transportation (Non-Medical): Not on file  Physical Activity:   . Days of Exercise per Week: Not on file  . Minutes of Exercise per Session: Not on file  Stress:   . Feeling of Stress : Not on file  Social Connections:   . Frequency of Communication with Friends and Family: Not on file  . Frequency of Social Gatherings with Friends and Family: Not on file  . Attends Religious Services: Not on file  . Active Member of Clubs or Organizations: Not on file  . Attends Archivist Meetings: Not on file  . Marital Status: Not on file  Intimate Partner Violence:   . Fear of Current or Ex-Partner: Not on file  . Emotionally Abused: Not on file  . Physically Abused: Not on file  . Sexually Abused: Not on file     Review of Systems: General: negative for chills, fever, night sweats or weight changes.  Cardiovascular: negative for chest pain, dyspnea on exertion, edema, orthopnea, palpitations, paroxysmal nocturnal dyspnea or shortness of breath Dermatological: negative for rash Respiratory: negative for cough or wheezing Urologic: negative for  hematuria Abdominal: negative for nausea, vomiting, diarrhea, bright red blood per rectum, melena, or hematemesis Neurologic: negative for visual changes, syncope, or dizziness All other systems reviewed and are otherwise negative except as noted above.    Blood pressure (!) 167/75, pulse 63, temperature 98.1 F (36.7 C), height 5\' 8"  (1.727 m), weight 148 lb 3.2 oz (67.2 kg), SpO2 98 %.  General appearance: alert and no distress Neck: no adenopathy, no carotid bruit, no JVD, supple, symmetrical, trachea midline and thyroid not enlarged, symmetric, no tenderness/mass/nodules Lungs: clear to auscultation bilaterally Heart: regular rate and rhythm, S1, S2 normal, no murmur, click, rub or gallop Extremities: extremities normal, atraumatic, no cyanosis or edema Pulses: 2+  and symmetric Skin: Skin color, texture, turgor normal. No rashes or lesions Neurologic: Alert and oriented X 3, normal strength and tone. Normal symmetric reflexes. Normal coordination and gait  EKG not performed today  ASSESSMENT AND PLAN:   Essential hypertension History of essential potential blood pressure measured today 167/75.  He is on amlodipine, diltiazem and metoprolol.  He checks his blood pressure at home and systolic blood pressures run in the 140-150 range.  I am going to increase his amlodipine from 2.5 to 5 mg a day.  CAD (coronary artery disease) History of CAD status post RCA stenting by myself 02/24/2018 resulting in significant dissection requiring placement of 4 overlapping drug-eluting stents.  His EF was preserved.  He is on Plavix in addition to Eliquis for his PAF.  He started developed some atypical chest pain or less several months and was recently seen in the emergency room 05/16/2019 with epigastric pain while waiting to be seated at [employer].  He was seen in the ER.  His EKG showed no acute changes.  His enzymes were negative and he was discharged home.  He is on Protonix 40 mg a day.  I am  going to get a Lexiscan Myoview stress test to further evaluate.      Miguel Harp MD FACP,FACC,FAHA, [facility] 05/20/2019 11:39 AM

## 2019-05-20 NOTE — Assessment & Plan Note (Signed)
History of essential potential blood pressure measured today 167/75.  He is on amlodipine, diltiazem and metoprolol.  He checks his blood pressure at home and systolic blood pressures run in the 140-150 range.  I am going to increase his amlodipine from 2.5 to 5 mg a day.

## 2019-05-26 ENCOUNTER — Telehealth (HOSPITAL_COMMUNITY): Payer: Self-pay

## 2019-05-26 NOTE — Telephone Encounter (Signed)
Encounter complete. 

## 2019-05-27 ENCOUNTER — Ambulatory Visit: Payer: Medicare Other | Admitting: Cardiovascular Disease

## 2019-05-29 ENCOUNTER — Ambulatory Visit (HOSPITAL_COMMUNITY)
Admission: RE | Admit: 2019-05-29 | Discharge: 2019-05-29 | Disposition: A | Discharge status: Home / Self Care | Payer: Medicare Other | Source: Ambulatory Visit | Attending: Cardiology | Admitting: Cardiology

## 2019-05-29 ENCOUNTER — Other Ambulatory Visit: Payer: Self-pay

## 2019-05-29 DIAGNOSIS — R072 Precordial pain: Secondary | ICD-10-CM | POA: Insufficient documentation

## 2019-05-29 LAB — MYOCARDIAL PERFUSION IMAGING
LV dias vol: 91 mL (ref 62–150)
LV sys vol: 29 mL
Peak HR: 105 {beats}/min
Rest HR: 68 {beats}/min
SDS: 0
SRS: 0
SSS: 0
TID: 0.94

## 2019-05-29 MED ORDER — TECHNETIUM TC 99M TETROFOSMIN IV KIT
9.6000 | PACK | Freq: Once | INTRAVENOUS | Status: AC | PRN
  Administered 2019-05-29: 9.6 via INTRAVENOUS
  Filled 2019-05-29: qty 10

## 2019-05-29 MED ORDER — TECHNETIUM TC 99M TETROFOSMIN IV KIT
30.8000 | PACK | Freq: Once | INTRAVENOUS | Status: AC | PRN
  Administered 2019-05-29: 30.8 via INTRAVENOUS
  Filled 2019-05-29: qty 31

## 2019-05-29 MED ORDER — REGADENOSON 0.4 MG/5ML IV SOLN
0.4000 mg | Freq: Once | INTRAVENOUS | Status: AC
  Administered 2019-05-29: 11:00:00 0.4 mg via INTRAVENOUS

## 2019-06-02 ENCOUNTER — Telehealth: Payer: Self-pay | Admitting: Cardiovascular Disease

## 2019-06-02 NOTE — Telephone Encounter (Signed)
Pt advised his stress test results.  

## 2019-06-02 NOTE — Telephone Encounter (Signed)
New message   Patient's wife would like to talk to you about test that the patient has had done. Please call to discuss.

## 2019-07-07 ENCOUNTER — Other Ambulatory Visit: Payer: Self-pay | Admitting: Cardiovascular Disease

## 2019-08-04 ENCOUNTER — Telehealth: Payer: Self-pay | Admitting: Cardiovascular Disease

## 2019-08-04 NOTE — Telephone Encounter (Signed)
New Message    Pts wife is calling and is wanting Dr Rachel Bo nurse to call her back. She says her and the pt has questions about the covid vaccine    Please advise

## 2019-08-04 NOTE — Telephone Encounter (Signed)
Pt is wanting to know if he should take the vaccine for Covid Will forward to Dr [name] for review and recommendations./cy

## 2019-08-05 NOTE — Telephone Encounter (Signed)
I'll defer that advice to his PCP

## 2019-08-05 NOTE — Telephone Encounter (Signed)
PT AWARE OF RECOMMENDATIONS ./CY 

## 2019-08-18 NOTE — Progress Notes (Signed)
Virtual Visit via Telephone Note   This visit type was conducted due to national recommendations for restrictions regarding the COVID-19 Pandemic (e.g. social distancing) in an effort to limit this patient's exposure and mitigate transmission in our community.  Due to his co-morbid illnesses, this patient is at least at moderate risk for complications without adequate follow up.  This format is felt to be most appropriate for this patient at this time.  The patient did not have access to video technology/had technical difficulties with video requiring transitioning to audio format only (telephone).  All issues noted in this document were discussed and addressed.  No physical exam could be performed with this format.  Please refer to the patient's chart for his  consent to telehealth for [facility].   Date:  08/19/2019   ID:  [name], DOB [birth year], MRN [id number]  Patient Location: Home Provider Location: Home  PCP:  Glenda Chroman, MD  Cardiologist:  Quay Burow, MD  Electrophysiologist:  None   Evaluation Performed:  Follow-Up Visit  Chief Complaint:  Shortness of Breath   History of Present Illness:    [name] is a 74 y.o. male we are following for ongoing assessment and management of hypertension, hyperlipidemia, chronic chest pain with normal Myoview perfusion study and normal echocardiogram.  He was placed on PPI with improvement in symptoms.  December 2018 the patient was admitted to [facility] with atrial for with RVR he converted to normal sinus rhythm with IV diltiazem and was transitioned to p.o. diltiazem.  He was started on Eliquis.  He followed up with Dr. Arbie Cookey in A. fib clinic.  She noted a more prolonged episode of PAF lasting 20 seconds.  She gave him as needed diltiazem 30 mg a day in conjunction with his normal diltiazem dose.  However he did report recurrent chest pain and was seen on 02/24/2018 by Dr. [name], revealing high-grade mid RCA  stenosis with significant dissection of the RCA with balloon dilatation requiring 4 overlapping Synergy drug-eluting stents.  His EF was preserved without inferior wall motion abnormality.  He was placed on triple therapy for 1 month after which aspirin was discontinued he remains on Plavix and Eliquis.Marland Kitchen  He was last seen by Dr. [name] on 05/20/2019, was found to have elevated blood pressure despite being on amlodipine, diltiazem, metoprolol.  Amlodipine was increased from 2.5 mg daily to 5 mg daily.  He was without significant chest pain but he continued to have some mild atypical chest pain.  He had been seen in the emergency room on 05/16/2019 with epigastric pain.  He was continued on Protonix 40 mg daily.  A repeat Lexiscan Myoview was ordered  This was completed on 05/29/2019.  EF 68%, with no ST segment deviation noted during stress.  It was a small defect of mild severity present in the basal inferior location.  The defect is nonreversible and was consistent with diaphragmatic attenuation artifact there was no ischemia found in listed as a low risk study.   He got his Pfizer vaccine first dose. Its been a few days and he is feeling better. He has been taking PPI again which has been helpful. He his now putting his mother in a nursing home which is stressful.   He complains of shortness of breath when he is anxious. He has a lot of stress and anxiety concerning his family.  He worries about taking the second shot. He reports that when he takes the  prn Xanax, he feels better and his shortness of breath and anxiety improve. He denies coughing, wheezing, edema, PND or orthopnea.   The patient does not have symptoms concerning for COVID-19 infection (fever, chills, cough, or new shortness of breath).    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 pylori infection    Headache    Hypercholesteremia    Hyperlipidemia    Hypertension    Hyponatremia 2013   Palpitations    event monior 04/11/05- SR with occ PAC   Personal history of colonic polyps 2 9 2007   Past Surgical History:  Procedure Laterality Date   BACK SURGERY     CATARACT EXTRACTION W/PHACO Left 12/08/2012   Procedure: CATARACT EXTRACTION PHACO AND INTRAOCULAR LENS PLACEMENT ([place]);  Surgeon: Tonny Branch, MD;  Location: [facility];  Service: Ophthalmology;  Laterality: Left;  CDE:  23.19   CATARACT EXTRACTION W/PHACO Right 04/15/2014   Procedure: CATARACT EXTRACTION PHACO AND INTRAOCULAR LENS PLACEMENT RIGHT;  Surgeon: Tonny Branch, MD;  Location: [facility];  Service: Ophthalmology;  Laterality: Right;  CDE:10.32   COLONOSCOPY  12/06/2010   Procedure: COLONOSCOPY;  Surgeon: Rogene Houston, MD;  Location: [facility];  Service: Endoscopy;  Laterality: N/A;   COLONOSCOPY N/A 05/23/2016   Procedure: COLONOSCOPY;  Surgeon: Rogene Houston, MD;  Location: [facility];  Service: Endoscopy;  Laterality: N/A;  930   CORONARY STENT INTERVENTION N/A 02/24/2018   Procedure: CORONARY STENT INTERVENTION;  Surgeon: Lorretta Harp, MD;  Location: [place] [facility];  Service: Cardiovascular;  Laterality: N/A;   ESOPHAGOGASTRODUODENOSCOPY N/A 07/20/2015   Procedure: ESOPHAGOGASTRODUODENOSCOPY (EGD);  Surgeon: Rogene Houston, MD;  Location: [facility];  Service: Endoscopy;  Laterality: N/A;  2:55 - moved to 1:55 - Ann notified pt   EYE SURGERY     LEFT HEART CATH AND CORONARY ANGIOGRAPHY N/A 02/24/2018   Procedure: LEFT HEART CATH AND CORONARY ANGIOGRAPHY;  Surgeon: Lorretta Harp, MD;  Location: [place] CV [facility];  Service: Cardiovascular;  Laterality: N/A;   LUMBAR DISC SURGERY Left 2007   "L4-5 ruptured discs"   POLYPECTOMY  05/23/2016   Procedure: POLYPECTOMY;  Surgeon: Rogene Houston, MD;  Location: [facility];  Service: Endoscopy;;  colon     Current Meds  Medication Sig   acetaminophen (TYLENOL) 500 MG tablet Take 250 mg by mouth 2 (two) times daily as needed for moderate pain or headache.   ALPRAZolam (XANAX) 0.5 MG tablet Take 0.5 mg by mouth 2 (two) times daily as needed for anxiety.   amLODipine (NORVASC) 5 MG tablet Take 1 tablet (5 mg total) by mouth daily.   apixaban (ELIQUIS) 5 MG TABS tablet Take 1 tablet (5 mg total) by mouth 2 (two) times daily.   atorvastatin (LIPITOR) 80 MG tablet TAKE 1 TABLET BY MOUTH AT BEDTIME (Patient taking differently: Take 80 mg by mouth at bedtime. )   clopidogrel (PLAVIX) 75 MG tablet Take 1 tablet (75 mg total) by mouth daily.   diltiazem (CARTIA XT) 120 MG 24 hr capsule Take 1 capsule (120 mg total) by mouth daily.   isosorbide mononitrate (IMDUR) 30 MG 24 hr tablet Take 1 tablet (30 mg total) by mouth daily.  metoprolol tartrate (LOPRESSOR) 25 MG tablet TAKE 1 TABLET BY MOUTH 2 TIMES A DAY   pantoprazole (PROTONIX) 40 MG tablet Take 1 tablet (40 mg total) by mouth every other day.   predniSONE (DELTASONE) 10 MG tablet Take 10 mg by mouth daily as needed.   Simethicone 180 MG CAPS Take 1 capsule (180 mg total) by mouth 3 (three) times daily as needed.     Allergies:   Lisinopril, Chlorthalidone, Flexeril [cyclobenzaprine], Hyoscyamine, Prevacid [lansoprazole], Valium [diazepam], and Potassium-containing compounds   Social History   Tobacco Use   Smoking status: Never Smoker   Smokeless tobacco: Never Used  Substance Use Topics   Alcohol use: No   Drug use: No     Family Hx: The patient's family history includes CAD in his father; Hyperlipidemia in his father; Hypertension in his father.  ROS:   Please see the history of present illness.    All other systems reviewed and are negative.   Prior CV studies:   The following studies were reviewed today: Echocardiogram 05/29/2019   Nuclear stress EF: 68%.  The left  ventricular ejection fraction is hyperdynamic (>65%).  There was no ST segment deviation noted during stress.  There is a small defect of mild severity present in the basal inferior location. The defect is non-reversible and consistent with diaphragmatic attenuation artifact. No ischemia.  This is a low risk study.     Labs/Other Tests and Data Reviewed:    EKG:  No ECG reviewed.  Recent Labs: 05/16/2019: ALT 20; BUN 12; Creatinine, Ser 0.89; Hemoglobin 12.5; Platelets 286; Potassium 4.4; Sodium 137   Recent Lipid Panel Lab Results  Component Value Date/Time   CHOL 174 08/27/2017 08:59 AM   TRIG 150 (H) 08/27/2017 08:59 AM   HDL 51 08/27/2017 08:59 AM   CHOLHDL 3.4 08/27/2017 08:59 AM   CHOLHDL 3.7 12/20/2014 09:47 AM   LDLCALC 93 08/27/2017 08:59 AM    Wt Readings from Last 3 Encounters:  08/19/19 150 lb (68 kg)  05/29/19 148 lb (67.1 kg)  05/20/19 148 lb 3.2 oz (67.2 kg)     Objective:    Vital Signs:  BP (!) 158/78    Pulse (!) 58    Ht 5\' 8"  (1.727 m)    Wt 150 lb (68 kg)    BMI 22.81 kg/m    VITAL SIGNS:  reviewed GEN:  no acute distress RESPIRATORY:  normal respiratory effort, symmetric expansion NEURO:  alert and oriented x 3, no obvious focal deficit PSYCH:  Anxious  ASSESSMENT & PLAN:    1. Hypertension: BP slightly elevated today. He is worried about his mother and taking the second COVID injection.  Once he calms down BP gets better. He will need to monitor BP for consistent elevations. We may need to increase dose of amlodipine to 7.5 mg or increase isosorbide.   2. CAD: RCA stenosis with 4 overlapping stents. Repeat NM stress test was low risk on 06/07/2019. Continue secondary prevention, DAPT, nitrates, statins.   3. Hyperlipidemia: Goal of LDL < 70 with HDL > 40.  Labs are by Dr. Woody Seller.  Most recent lab in 2019 had LDL at   96. Will need to have these drawn if not completed by PCP.  4. Chronic Anxiety: On Xanax. He is to see Dr. Woody Seller for refills and  management.   COVID-19 Education: The signs and symptoms of COVID-19 were discussed with the patient and how to seek care for testing (follow up with PCP  or arrange E-visit). The importance of social distancing was discussed today.  Time:   Today, I have spent 20 minutes with the patient with telehealth technology discussing the above problems.     Medication Adjustments/Labs and Tests Ordered: Current medicines are reviewed at length with the patient today.  Concerns regarding medicines are outlined above.   Tests Ordered: No orders of the defined types were placed in this encounter.   Medication Changes: No orders of the defined types were placed in this encounter.   Disposition:  Follow up 6 months, Dr. [name].  Signed, Phill Myron. [place] Pugh, ANP, AACC  08/19/2019 8:56 AM    [facility]

## 2019-08-19 ENCOUNTER — Encounter: Payer: Self-pay | Admitting: Adult Health

## 2019-08-19 ENCOUNTER — Telehealth (INDEPENDENT_AMBULATORY_CARE_PROVIDER_SITE_OTHER): Payer: Medicare Other | Admitting: Adult Health

## 2019-08-19 VITALS — BP 158/78 | HR 58 | Ht 68.0 in | Wt 150.0 lb

## 2019-08-19 DIAGNOSIS — F419 Anxiety disorder, unspecified: Secondary | ICD-10-CM

## 2019-08-19 DIAGNOSIS — T81718A Complication of other artery following a procedure, not elsewhere classified, initial encounter: Secondary | ICD-10-CM

## 2019-08-19 DIAGNOSIS — I10 Essential (primary) hypertension: Secondary | ICD-10-CM

## 2019-08-19 DIAGNOSIS — I480 Paroxysmal atrial fibrillation: Secondary | ICD-10-CM | POA: Diagnosis not present

## 2019-08-19 DIAGNOSIS — I2510 Atherosclerotic heart disease of native coronary artery without angina pectoris: Secondary | ICD-10-CM

## 2019-08-19 DIAGNOSIS — I2542 Coronary artery dissection: Secondary | ICD-10-CM

## 2019-08-19 NOTE — Patient Instructions (Signed)

## 2019-09-14 ENCOUNTER — Ambulatory Visit (HOSPITAL_COMMUNITY)
Admission: RE | Admit: 2019-09-14 | Discharge: 2019-09-14 | Disposition: A | Discharge status: Home / Self Care | Payer: Medicare Other | Source: Ambulatory Visit | Attending: Internal Medicine | Admitting: Internal Medicine

## 2019-09-14 ENCOUNTER — Other Ambulatory Visit: Payer: Self-pay

## 2019-09-14 ENCOUNTER — Other Ambulatory Visit (HOSPITAL_COMMUNITY): Payer: Self-pay | Admitting: Internal Medicine

## 2019-09-14 DIAGNOSIS — R059 Cough, unspecified: Secondary | ICD-10-CM

## 2019-09-14 DIAGNOSIS — R05 Cough: Secondary | ICD-10-CM | POA: Insufficient documentation

## 2019-10-29 IMAGING — DX DG CHEST 2V
2 series · 2 of 2 positions shown · non-contrast
Comparison: 01/24/2018

CLINICAL DATA: Chest pain

EXAM:
CHEST - 2 VIEW

[chest pa]
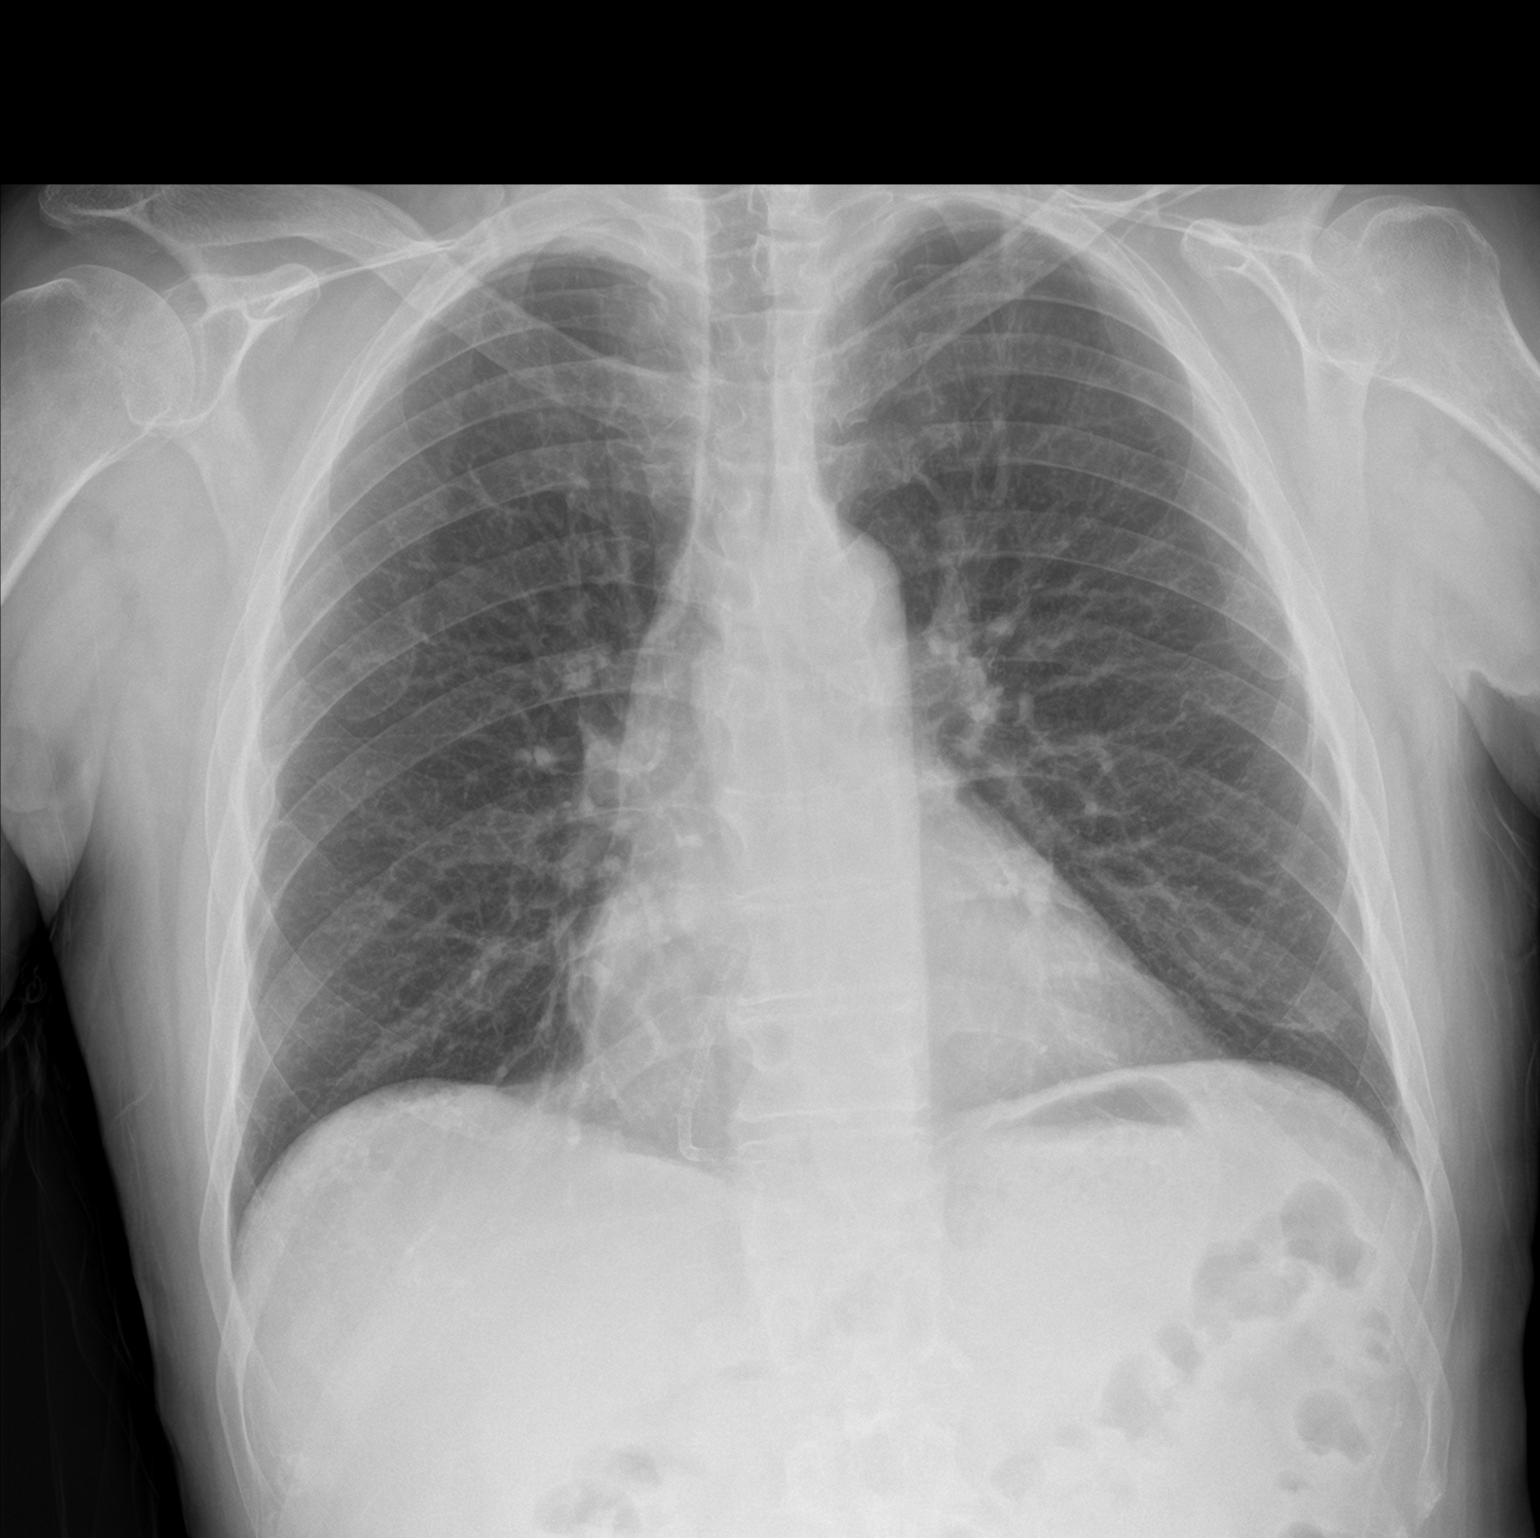

[chest lat]
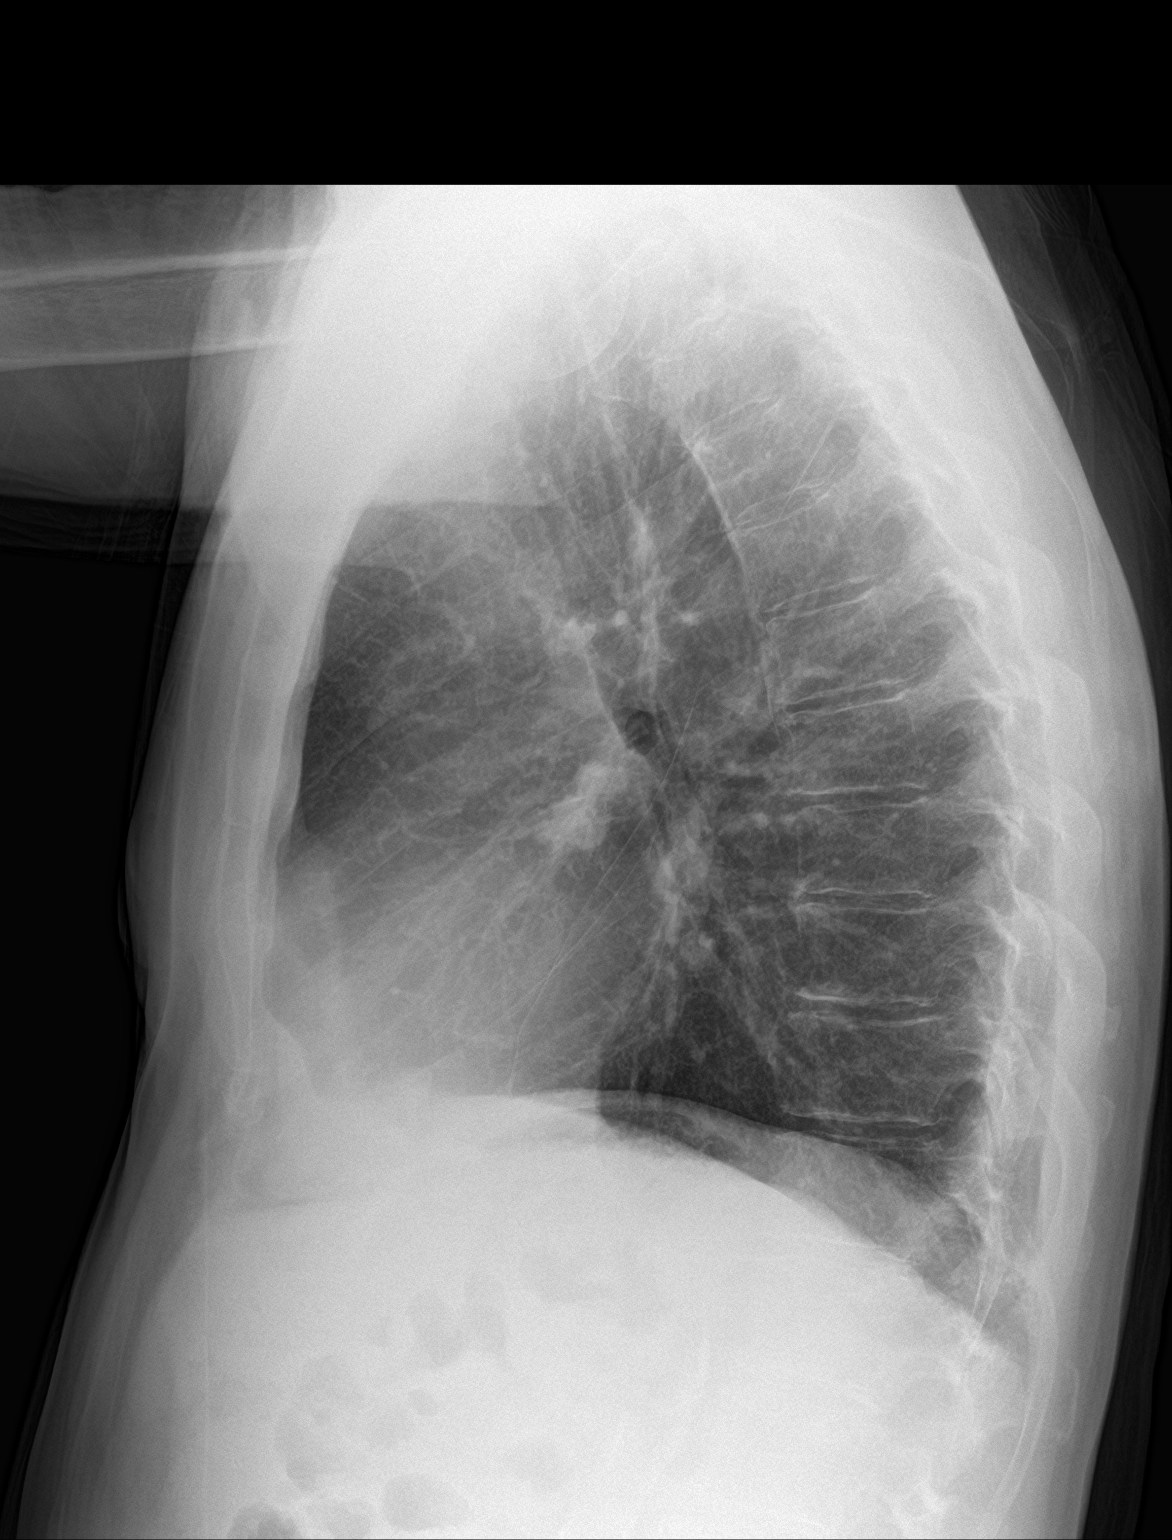

[2 of 2 positions shown; findings below may reference images not displayed]

FINDINGS: Heart size normal. Right coronary stents. Negative for heart
failure. Lungs are clear without infiltrate effusion or mass.
Chronic right rib fractures
IMPRESSION: No active cardiopulmonary disease.

## 2019-11-11 ENCOUNTER — Other Ambulatory Visit: Payer: Self-pay | Admitting: Adult Health

## 2019-11-13 ENCOUNTER — Other Ambulatory Visit: Payer: Self-pay | Admitting: Cardiovascular Disease

## 2019-12-09 ENCOUNTER — Other Ambulatory Visit: Payer: Self-pay | Admitting: Adult Health

## 2019-12-18 ENCOUNTER — Other Ambulatory Visit: Payer: Self-pay

## 2019-12-18 ENCOUNTER — Other Ambulatory Visit: Payer: Medicare Other

## 2019-12-18 DIAGNOSIS — Z20822 Contact with and (suspected) exposure to covid-19: Secondary | ICD-10-CM

## 2019-12-19 LAB — NOVEL CORONAVIRUS, NAA: SARS-CoV-2, NAA: NOT DETECTED

## 2019-12-19 LAB — SARS-COV-2, NAA 2 DAY TAT

## 2019-12-21 ENCOUNTER — Telehealth: Payer: Self-pay | Admitting: Cardiovascular Disease

## 2019-12-21 NOTE — Telephone Encounter (Signed)
Spoke with patient. For the past week patient has been experiencing chest pain off and on. It is located on both sides of the chest under the collar bones. He reports it's a soreness, especially when he mashes on it.   Patient reports sometimes protonix helps the pain.  He reports when it occurs he feels "nervous" and his nerves act up. Patient has Xanax for anxiety but has not taken Xanax during the episodes.   He reports he has put aspercreme on the areas when the pain occurs and it helps a little. He reports sometimes the pain comes in the center of his chest and he has put aspercreme on it too. Patient reports he pressured washed his windows last week as well.   Patient reports his blood pressure and heart rate are okay.   Patient advised to take his prn medication if he feels it is his nerves, otherwise if he develops chest pain or chest pain accompanied by SOB/nausea or sweating to call 911 or report to the nearest emergency room.   Patient placed on schedule for 11/17 with Coletta Memos, NP. Patient to call back if anything changes.

## 2019-12-21 NOTE — Telephone Encounter (Signed)
11/17 is probably to far out to be seen in a pt with CP. You can put him on my schedule next week

## 2019-12-21 NOTE — Telephone Encounter (Signed)
Pt c/o of Chest Pain: STAT if CP now or developed within 24 hours  1. Are you having CP right now? Top on both sides , it feels like a soreness , pt had a stent put in a couple years ago   2. Are you experiencing any other symptoms (ex. SOB, nausea, vomiting, sweating)? No other symptoms right now   3. How long have you been experiencing CP? 1 week   4. Is your CP continuous or coming and going? Coming and going   5. Have you taken Nitroglycerin?  No  ? Pt stated he just started having some on and off top chest pain, pt would like to see if he he needs to get in to see some one?   Best number  [phone]

## 2019-12-22 NOTE — Telephone Encounter (Signed)
Patient placed on schedule for 10/12 with Dr. [name]. Patient updated. Verbalized understanding.

## 2019-12-29 ENCOUNTER — Encounter: Payer: Self-pay | Admitting: Cardiovascular Disease

## 2019-12-29 ENCOUNTER — Ambulatory Visit: Payer: Medicare Other | Admitting: Cardiovascular Disease

## 2019-12-29 ENCOUNTER — Other Ambulatory Visit: Payer: Self-pay

## 2019-12-29 DIAGNOSIS — I10 Essential (primary) hypertension: Secondary | ICD-10-CM | POA: Diagnosis not present

## 2019-12-29 DIAGNOSIS — T81718A Complication of other artery following a procedure, not elsewhere classified, initial encounter: Secondary | ICD-10-CM

## 2019-12-29 DIAGNOSIS — I480 Paroxysmal atrial fibrillation: Secondary | ICD-10-CM | POA: Diagnosis not present

## 2019-12-29 DIAGNOSIS — E785 Hyperlipidemia, unspecified: Secondary | ICD-10-CM

## 2019-12-29 DIAGNOSIS — I2542 Coronary artery dissection: Secondary | ICD-10-CM

## 2019-12-29 NOTE — Patient Instructions (Signed)
Medication Instructions:  Continue current medications  *If you need a refill on your cardiac medications before your next appointment, please call your pharmacy*   Lab Work: None Ordered   Testing/Procedures: None Ordered   Follow-Up: At [facility], you and your health needs are our priority.  As part of our continuing mission to provide you with exceptional heart care, we have created designated Provider Care Teams.  These Care Teams include your primary Cardiologist (physician) and Advanced Practice Providers (APPs -  Physician Assistants and Nurse Practitioners) who all work together to provide you with the care you need, when you need it.  We recommend signing up for the patient portal called "MyChart".  Sign up information is provided on this After Visit Summary.  MyChart is used to connect with patients for Virtual Visits (Telemedicine).  Patients are able to view lab/test results, encounter notes, upcoming appointments, etc.  Non-urgent messages can be sent to your provider as well.   To learn more about what you can do with MyChart, go to [email or web address].    Your next appointment:   1 year(s)  The format for your next appointment:   In Person  Provider:   You may see Quay Burow, MD or one of the following Advanced Practice Providers on your designated Care Team:    Kerin Ransom, PA-C  [place], [place]  Coletta Memos, [place]    Other Instructions Your physician recommends that you schedule a follow-up appointment in: 6 Months with [name], DNP

## 2019-12-29 NOTE — Assessment & Plan Note (Signed)
History of CAD status post cardiac cath by myself 02/24/2018 revealing high-grade mid RCA stenosis.  He had significant dissection with balloon angioplasty requiring 4 overlapping drug-eluting stents.  The remainder of his coronary family was unremarkable and his EF was preserved.  He has had atypical noncardiac sounding chest pain and did have a negative Myoview performed 05/29/2019.

## 2019-12-29 NOTE — Assessment & Plan Note (Signed)
History of dyslipidemia on statin therapy with lipid profile performed 08/27/2017 revealing a total cholesterol 174, LDL of 93 and HDL 51.  It has been 2 years since he has had a lipid profile performed.  Of asked him to have repeat blood work by his primary care doctor.

## 2019-12-29 NOTE — Assessment & Plan Note (Signed)
History of essential hypertension a blood pressure measured today 138/62.  He is on amlodipine, diltiazem, Lopressor.

## 2019-12-29 NOTE — Assessment & Plan Note (Signed)
History of PAF in the past maintaining sinus rhythm on Eliquis oral anticoagulation.

## 2019-12-29 NOTE — Progress Notes (Signed)
12/29/2019 [name]   April 13, 1945  [id number]  Primary Physician Glenda Chroman, MD Primary Cardiologist: Lorretta Harp MD Garret Reddish, [place], [name]  HPI:  LOUDON Parker is a 74 y.o.    well-appearing Caucasian male who Ilast saw him in the office 05/20/2019.He has a history of hypertension and hyperlipidemia. He had atypical chest pain here tone with a negative Myoview4/23/14. His pain improved with proton pump inhibition. Because of recurrent chest pain he saw [name], PA-C in the office who did obtain a 2-D echo and Myoview perfusion study all normal. His pain has improved again on a PPI suggesting that it was related to GERD. Since I saw Mr. Skillern in the office a year ago he has been well until this past Christmas when he was admitted to [facility] with A. fib with RVR. He converted to normal sinus rhythm with IV diltiazem and was converted to by mouth Cardizem. His troponins were negative and 2-D echo was normal. Because of his elevated CHA2DsVASC2 score of 2 he was begun on Eliquis oral anticoagulation. He saw Roderic Palau in the A. fib clinic 04/19/16 because of palpitations. He says he occasionally feels his heart skip or missed AB. She also had a more prolonged episode of PAF lasting 20 seconds. She gave him when necessary diltiazem 30 mg a day for persistently elevated heart rate.He was just seen in the emergency room 07/07/16 by Dr. Ellender Hose. He was complaining of palpitations and was noted to have PVCs.  Hewas admitted to the hospital and had cardiac cath by myself 02/24/2018 revealing a high-grade mid RCA stenosis. 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 His EF was preserved without inferior wall motion abnormality. He was on "triple therapy" for 1 month after which aspirin was discontinued. He remains on Plavix and Eliquis.   He did see [name], NP in the office 12/11/2018  who added a PPI and gave him some Xanax.  He was seen in the ER 05/16/2019 with epigastric pain.  His EKG showed no acute changes.  His troponins were negative x2.  He does get occasional episodes of epigastric pain on Protonix.    He continues to have epigastric pain improved with sucralfate.  I did perform a Myoview stress test on him 05/29/2019 which was entirely normal.   Current Meds  Medication Sig  . acetaminophen (TYLENOL) 500 MG tablet Take 250 mg by mouth 2 (two) times daily as needed for moderate pain or headache.  . ALPRAZolam (XANAX) 0.5 MG tablet Take 0.5 mg by mouth 2 (two) times daily as needed for anxiety.  Marland Kitchen amLODipine (NORVASC) 5 MG tablet Take 1 tablet (5 mg total) by mouth daily.  Marland Kitchen apixaban (ELIQUIS) 5 MG TABS tablet Take 1 tablet (5 mg total) by mouth 2 (two) times daily.  Marland Kitchen atorvastatin (LIPITOR) 80 MG tablet TAKE 1 TABLET BY MOUTH AT BEDTIME.  . clopidogrel (PLAVIX) 75 MG tablet TAKE 1 TABLET BY MOUTH ONCE DAILY.  Marland Kitchen diltiazem (CARTIA XT) 120 MG 24 hr capsule Take 1 capsule (120 mg total) by mouth daily.  . isosorbide mononitrate (IMDUR) 30 MG 24 hr tablet TAKE ONE TABLET BY MOUTH ONCE DAILY.  . metoprolol tartrate (LOPRESSOR) 25 MG tablet TAKE 1 TABLET BY MOUTH 2 TIMES A DAY  . pantoprazole (PROTONIX) 40 MG tablet Take 1 tablet (40 mg total) by mouth every other day.  . predniSONE (DELTASONE) 10  MG tablet Take 10 mg by mouth daily as needed.  . Simethicone 180 MG CAPS Take 1 capsule (180 mg total) by mouth 3 (three) times daily as needed.  . sucralfate (CARAFATE) 1 g tablet Take 1 g by mouth 4 (four) times daily.     Allergies  Allergen Reactions  . Lisinopril Swelling    H/o of angioedema   . Chlorthalidone Other (See Comments)    Causes severe hyponatremia  . Flexeril [Cyclobenzaprine]     Unknown reaction  . Hyoscyamine Swelling  . Prevacid [Lansoprazole] Swelling  . Valium [Diazepam] Other (See Comments)    "makes me feel crazy"  . Potassium-Containing  Compounds Palpitations    Social History   Socioeconomic History  . Marital status: Married    Spouse name: Not on file  . Number of children: 1  . Years of education: Not on file  . Highest education level: Not on file  Occupational History  . Not on file  Tobacco Use  . Smoking status: Never Smoker  . Smokeless tobacco: Never Used  Substance and Sexual Activity  . Alcohol use: No  . Drug use: No  . Sexual activity: Not on file  Other Topics Concern  . Not on file  Social History Narrative   ** Merged History Encounter **       Social Determinants of Health   Financial Resource Strain:   . Difficulty of Paying Living Expenses: Not on file  Food Insecurity:   . Worried About Charity fundraiser in the Last Year: Not on file  . Ran Out of Food in the Last Year: Not on file  Transportation Needs:   . Lack of Transportation (Medical): Not on file  . Lack of Transportation (Non-Medical): Not on file  Physical Activity:   . Days of Exercise per Week: Not on file  . Minutes of Exercise per Session: Not on file  Stress:   . Feeling of Stress : Not on file  Social Connections:   . Frequency of Communication with Friends and Family: Not on file  . Frequency of Social Gatherings with Friends and Family: Not on file  . Attends Religious Services: Not on file  . Active Member of Clubs or Organizations: Not on file  . Attends Archivist Meetings: Not on file  . Marital Status: Not on file  Intimate Partner Violence:   . Fear of Current or Ex-Partner: Not on file  . Emotionally Abused: Not on file  . Physically Abused: Not on file  . Sexually Abused: Not on file     Review of Systems: General: negative for chills, fever, night sweats or weight changes.  Cardiovascular: negative for chest pain, dyspnea on exertion, edema, orthopnea, palpitations, paroxysmal nocturnal dyspnea or shortness of breath Dermatological: negative for rash Respiratory: negative for cough  or wheezing Urologic: negative for hematuria Abdominal: negative for nausea, vomiting, diarrhea, bright red blood per rectum, melena, or hematemesis Neurologic: negative for visual changes, syncope, or dizziness All other systems reviewed and are otherwise negative except as noted above.    Blood pressure 138/62, pulse 62, height 5\' 8"  (1.727 m), weight 151 lb 6.4 oz (68.7 kg), SpO2 98 %.  General appearance: alert and no distress Neck: no adenopathy, no JVD, supple, symmetrical, trachea midline, thyroid not enlarged, symmetric, no tenderness/mass/nodules and Soft left carotid bruit Lungs: clear to auscultation bilaterally Heart: regular rate and rhythm, S1, S2 normal, no murmur, click, rub or gallop Extremities: extremities normal, atraumatic,  no cyanosis or edema Pulses: 2+ and symmetric Skin: Skin color, texture, turgor normal. No rashes or lesions Neurologic: Alert and oriented X 3, normal strength and tone. Normal symmetric reflexes. Normal coordination and gait  EKG sinus rhythm at 62 with right bundle branch block and occasional PVCs.  I personally reviewed this EKG.  ASSESSMENT AND PLAN:   Dyslipidemia History of dyslipidemia on statin therapy with lipid profile performed 08/27/2017 revealing a total cholesterol 174, LDL of 93 and HDL 51.  It has been 2 years since he has had a lipid profile performed.  Of asked him to have repeat blood work by his primary care doctor.  Essential hypertension History of essential hypertension a blood pressure measured today 138/62.  He is on amlodipine, diltiazem, Lopressor.  PAF (paroxysmal atrial fibrillation) (HCC) History of PAF in the past maintaining sinus rhythm on Eliquis oral anticoagulation.  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  He had significant dissection with balloon angioplasty requiring  4 overlapping drug-eluting stents.  The remainder of his coronary family was unremarkable and his EF was preserved.  He has had atypical noncardiac sounding chest pain and did have a negative Myoview performed 05/29/2019.      Lorretta Harp MD FACP,FACC,FAHA, [facility] 12/29/2019 2:07 PM

## 2019-12-31 ENCOUNTER — Other Ambulatory Visit: Payer: Self-pay | Admitting: Adult Health

## 2019-12-31 ENCOUNTER — Other Ambulatory Visit: Payer: Self-pay | Admitting: Cardiovascular Disease

## 2020-01-04 ENCOUNTER — Other Ambulatory Visit: Payer: Self-pay

## 2020-01-04 MED ORDER — METOPROLOL TARTRATE 25 MG PO TABS: 25.0000 mg | ORAL_TABLET | Freq: Two times a day (BID) | ORAL | 1 refills | Status: DC

## 2020-01-04 MED ORDER — CLOPIDOGREL BISULFATE 75 MG PO TABS: 75.0000 mg | ORAL_TABLET | Freq: Every day | ORAL | 1 refills | Status: DC

## 2020-01-04 MED ORDER — APIXABAN 5 MG PO TABS: 5.0000 mg | ORAL_TABLET | Freq: Two times a day (BID) | ORAL | 1 refills | Status: DC

## 2020-01-04 MED ORDER — ATORVASTATIN CALCIUM 80 MG PO TABS
80.0000 mg | ORAL_TABLET | Freq: Every day | ORAL | 1 refills | Status: DC
Start: 2020-01-04 — End: 2020-03-30

## 2020-01-07 ENCOUNTER — Other Ambulatory Visit: Payer: Self-pay | Admitting: Adult Health

## 2020-02-03 ENCOUNTER — Ambulatory Visit: Payer: Medicare Other | Admitting: General Practice

## 2020-02-24 ENCOUNTER — Telehealth: Payer: Self-pay | Admitting: Pharmacist

## 2020-02-24 MED ORDER — APIXABAN 5 MG PO TABS: 5.0000 mg | ORAL_TABLET | Freq: Two times a day (BID) | ORAL | 0 refills | Status: DC

## 2020-02-24 NOTE — Telephone Encounter (Signed)
ELIQUIS SAMPLE

## 2020-03-28 ENCOUNTER — Other Ambulatory Visit: Payer: Self-pay | Admitting: Adult Health

## 2020-03-30 ENCOUNTER — Other Ambulatory Visit: Payer: Self-pay | Admitting: Pharmacist

## 2020-03-30 MED ORDER — METOPROLOL TARTRATE 25 MG PO TABS
25.0000 mg | ORAL_TABLET | Freq: Two times a day (BID) | ORAL | 1 refills | Status: DC
Start: 2020-03-30 — End: 2020-07-22

## 2020-03-30 MED ORDER — ATORVASTATIN CALCIUM 80 MG PO TABS
80.0000 mg | ORAL_TABLET | Freq: Every day | ORAL | 1 refills | Status: DC
Start: 2020-03-30 — End: 2020-08-23

## 2020-03-30 MED ORDER — CLOPIDOGREL BISULFATE 75 MG PO TABS
75.0000 mg | ORAL_TABLET | Freq: Every day | ORAL | 1 refills | Status: DC
Start: 2020-03-30 — End: 2020-08-23

## 2020-03-30 MED ORDER — ISOSORBIDE MONONITRATE ER 30 MG PO TB24
30.0000 mg | ORAL_TABLET | Freq: Every day | ORAL | 1 refills | Status: DC
Start: 2020-03-30 — End: 2020-08-23

## 2020-05-09 ENCOUNTER — Other Ambulatory Visit: Payer: Self-pay | Admitting: Cardiovascular Disease

## 2020-05-09 ENCOUNTER — Other Ambulatory Visit: Payer: Self-pay

## 2020-05-09 MED ORDER — AMLODIPINE BESYLATE 5 MG PO TABS
5.0000 mg | ORAL_TABLET | Freq: Every day | ORAL | 3 refills | Status: DC
Start: 2020-05-09 — End: 2020-05-09

## 2020-05-10 ENCOUNTER — Ambulatory Visit (INDEPENDENT_AMBULATORY_CARE_PROVIDER_SITE_OTHER): Payer: Medicare Other | Admitting: Internal Medicine

## 2020-07-08 ENCOUNTER — Encounter: Payer: Self-pay | Admitting: Cardiovascular Disease

## 2020-07-08 ENCOUNTER — Telehealth: Payer: Self-pay

## 2020-07-08 ENCOUNTER — Other Ambulatory Visit: Payer: Self-pay

## 2020-07-08 ENCOUNTER — Ambulatory Visit: Payer: Medicare Other | Admitting: Cardiovascular Disease

## 2020-07-08 VITALS — BP 154/66 | HR 64 | Ht 68.0 in | Wt 154.0 lb

## 2020-07-08 DIAGNOSIS — E785 Hyperlipidemia, unspecified: Secondary | ICD-10-CM

## 2020-07-08 DIAGNOSIS — I480 Paroxysmal atrial fibrillation: Secondary | ICD-10-CM

## 2020-07-08 DIAGNOSIS — I10 Essential (primary) hypertension: Secondary | ICD-10-CM | POA: Diagnosis not present

## 2020-07-08 DIAGNOSIS — I25118 Atherosclerotic heart disease of native coronary artery with other forms of angina pectoris: Secondary | ICD-10-CM

## 2020-07-08 NOTE — Assessment & Plan Note (Signed)
History of CAD status post cardiac catheterization by myself 02/24/2018 revealing high-grade mid RCA stenosis.  He ultimately had spiral dissection after dilating the mid RCA requiring 4 overlapping Synergy drug-eluting stents.  His EF was preserved without any inferior wall motion abnormality.  He remains on "triple therapy" for 1 month after which aspirin was discontinued.  Plavix and Eliquis were continued.  He has had no recurrent chest pain.

## 2020-07-08 NOTE — Telephone Encounter (Signed)
Medication samples have been provided to the patient while here for office visit.  Drug name: Eliquis 5mg   Qty: 3 boxes  LOT: [id number]  Exp.Date: 10/17/22

## 2020-07-08 NOTE — Progress Notes (Signed)
07/08/2020 [name]   11/19/1945  [id number]  Primary Physician Miguel Chroman, MD Primary Cardiologist: Miguel Harp MD FACP, [place], [place], [name]  HPI:  [name] is a 75 y.o.  well-appearing Caucasian male who Ilastsaw him in the office  12/29/2019.He has a history of hypertension and hyperlipidemia. He had atypical chest pain here tone with a negative Myoview4/23/14. His pain improved with proton pump inhibition. Because of recurrent chest pain he saw Miguel Henri, PA-C in the office who did obtain a 2-D echo and Myoview perfusion study all normal. His pain has improved again on a PPI suggesting that it was related to GERD. Since I saw [name] in the office a year ago he has been well until this past Christmas when he was admitted to [facility] with A. fib with RVR. He converted to normal sinus rhythm with IV diltiazem and was converted to by mouth Cardizem. His troponins were negative and 2-D echo was normal. Because of his elevated CHA2DsVASC2 score of 2 he was begun on Eliquis oral anticoagulation. He saw [name] in the A. fib clinic 04/19/16 because of palpitations. He says he occasionally feels his heart skip or missed AB. She also had a more prolonged episode of PAF lasting 20 seconds. She gave him when necessary diltiazem 30 mg a day for persistently elevated heart rate.He was just seen in the emergency room 07/07/16 by Dr. Ellender Parker. He was complaining of palpitations and was noted to have PVCs.  Hewas admitted to the hospital and had cardiac cath by myself 02/24/2018 revealing a high-grade mid RCA stenosis. 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 His EF was preserved without inferior wall motion abnormality. He was on "triple therapy" for 1 month after which aspirin was discontinued. He remains on Plavix and Eliquis.  He did see Miguel Sims, NP in the office 12/11/2018  who added a PPI and gave him some Xanax.He was seen in the ER 05/16/2019 with epigastric pain. His EKG showed no acute changes. His troponins were negative x2. He does get occasional episodes of epigastric pain on Protonix.   Since I saw him 6 months ago he continues to do well.  He did have a negative Myoview stress test 05/29/2019.  He currently denies chest pain or shortness of breath.  He scheduled for a colonoscopy in the near future.   Current Meds  Medication Sig  . acetaminophen (TYLENOL) 500 MG tablet Take 250 mg by mouth 2 (two) times daily as needed for moderate pain or headache.  . ALPRAZolam (XANAX) 0.5 MG tablet Take 0.5 mg by mouth 2 (two) times daily as needed for anxiety.  Marland Kitchen amLODipine (NORVASC) 5 MG tablet TAKE ONE TABLET BY MOUTH ONCE DAILY.  Marland Kitchen apixaban (ELIQUIS) 5 MG TABS tablet Take 1 tablet (5 mg total) by mouth 2 (two) times daily.  Marland Kitchen atorvastatin (LIPITOR) 80 MG tablet Take 1 tablet (80 mg total) by mouth at bedtime.  Marland Kitchen CARTIA XT 120 MG 24 hr capsule TAKE ONE CAPSULE BY MOUTH ONCE DAILY.  Marland Kitchen clopidogrel (PLAVIX) 75 MG tablet Take 1 tablet (75 mg total) by mouth daily.  . isosorbide mononitrate (IMDUR) 30 MG 24 hr tablet Take 1 tablet (30 mg total) by mouth daily.  . metoprolol tartrate (LOPRESSOR) 25 MG tablet Take 1 tablet (25 mg total) by mouth 2 (two) times daily.  . pantoprazole (PROTONIX) 40 MG tablet Take 1  tablet (40 mg total) by mouth every other day.  . predniSONE (DELTASONE) 10 MG tablet Take 10 mg by mouth daily as needed.  . Simethicone 180 MG CAPS Take 1 capsule (180 mg total) by mouth 3 (three) times daily as needed.  . sucralfate (CARAFATE) 1 g tablet Take 1 g by mouth 4 (four) times daily.     Allergies  Allergen Reactions  . Lisinopril Swelling    H/o of angioedema   . Chlorthalidone Other (See Comments)    Causes severe hyponatremia  . Flexeril [Cyclobenzaprine]     Unknown reaction  . Hyoscyamine Swelling  . Prevacid [Lansoprazole]  Swelling  . Valium [Diazepam] Other (See Comments)    "makes me feel crazy"  . Potassium-Containing Compounds Palpitations    Social History   Socioeconomic History  . Marital status: Married    Spouse name: Not on file  . Number of children: 1  . Years of education: Not on file  . Highest education level: Not on file  Occupational History  . Not on file  Tobacco Use  . Smoking status: Never Smoker  . Smokeless tobacco: Never Used  Substance and Sexual Activity  . Alcohol use: No  . Drug use: No  . Sexual activity: Not on file  Other Topics Concern  . Not on file  Social History Narrative   ** Merged History Encounter **       Social Determinants of Health   Financial Resource Strain: Not on file  Food Insecurity: Not on file  Transportation Needs: Not on file  Physical Activity: Not on file  Stress: Not on file  Social Connections: Not on file  Intimate Partner Violence: Not on file     Review of Systems: General: negative for chills, fever, night sweats or weight changes.  Cardiovascular: negative for chest pain, dyspnea on exertion, edema, orthopnea, palpitations, paroxysmal nocturnal dyspnea or shortness of breath Dermatological: negative for rash Respiratory: negative for cough or wheezing Urologic: negative for hematuria Abdominal: negative for nausea, vomiting, diarrhea, bright red blood per rectum, melena, or hematemesis Neurologic: negative for visual changes, syncope, or dizziness All other systems reviewed and are otherwise negative except as noted above.    Blood pressure (!) 154/66, pulse 64, height 5\' 8"  (1.727 m), weight 154 lb (69.9 kg).  General appearance: alert and no distress Neck: no adenopathy, no carotid bruit, no JVD, supple, symmetrical, trachea midline and thyroid not enlarged, symmetric, no tenderness/mass/nodules Lungs: clear to auscultation bilaterally Heart: regular rate and rhythm, S1, S2 normal, no murmur, click, rub or  gallop Extremities: extremities normal, atraumatic, no cyanosis or edema Pulses: 2+ and symmetric Skin: Skin color, texture, turgor normal. No rashes or lesions Neurologic: Alert and oriented X 3, normal strength and tone. Normal symmetric reflexes. Normal coordination and gait  EKG sinus rhythm at 64 with right bundle branch block.  I personally reviewed this EKG.  ASSESSMENT AND PLAN:   Dyslipidemia History of hyperlipidemia on statin therapy with lipid profile performed 04/21/2020 revealing total cholesterol of 140, LDL of 78 and HDL 48.   Essential hypertension History of essential hypertension with blood pressure measured today 154/66.  He is on amlodipine, Cardizem and metoprolol.  PAF (paroxysmal atrial fibrillation) (HCC) History of PAF maintaining sinus rhythm on Eliquis oral anticoagulation.  CAD (coronary artery disease) History of CAD status post cardiac catheterization by myself 02/24/2018 revealing high-grade mid RCA stenosis.  He ultimately had spiral dissection after dilating the mid RCA requiring 4 overlapping Synergy  drug-eluting stents.  His EF was preserved without any inferior wall motion abnormality.  He remains on "triple therapy" for 1 month after which aspirin was discontinued.  Plavix and Eliquis were continued.  He has had no recurrent chest pain.      Miguel Harp MD FACP,FACC,FAHA, [facility] 07/08/2020 10:21 AM

## 2020-07-08 NOTE — Patient Instructions (Signed)

## 2020-07-08 NOTE — Assessment & Plan Note (Signed)
History of PAF maintaining sinus rhythm on Eliquis oral anticoagulation. 

## 2020-07-08 NOTE — Assessment & Plan Note (Signed)
History of hyperlipidemia on statin therapy with lipid profile performed 04/21/2020 revealing total cholesterol of 140, LDL of 78 and HDL 48.

## 2020-07-08 NOTE — Assessment & Plan Note (Signed)
History of essential hypertension with blood pressure measured today 154/66.  He is on amlodipine, Cardizem and metoprolol.

## 2020-07-22 ENCOUNTER — Other Ambulatory Visit: Payer: Self-pay | Admitting: Cardiovascular Disease

## 2020-07-22 NOTE — Telephone Encounter (Signed)
60m, 69.9kg, Creatinine, Serum 0.890 mg/ 05/16/2019, lovw/berry 07/08/20

## 2020-08-10 ENCOUNTER — Other Ambulatory Visit (HOSPITAL_COMMUNITY)
Admission: RE | Admit: 2020-08-10 | Discharge: 2020-08-10 | Disposition: A | Discharge status: Home / Self Care | Payer: Medicare Other | Source: Ambulatory Visit | Attending: Internal Medicine | Admitting: Internal Medicine

## 2020-08-10 ENCOUNTER — Other Ambulatory Visit (HOSPITAL_COMMUNITY): Payer: Self-pay | Admitting: Internal Medicine

## 2020-08-10 ENCOUNTER — Other Ambulatory Visit: Payer: Self-pay | Admitting: Internal Medicine

## 2020-08-10 ENCOUNTER — Other Ambulatory Visit: Payer: Self-pay

## 2020-08-10 ENCOUNTER — Ambulatory Visit (HOSPITAL_COMMUNITY)
Admission: RE | Admit: 2020-08-10 | Discharge: 2020-08-10 | Disposition: A | Discharge status: Home / Self Care | Payer: Medicare Other | Source: Ambulatory Visit | Attending: Internal Medicine | Admitting: Internal Medicine

## 2020-08-10 DIAGNOSIS — R109 Unspecified abdominal pain: Secondary | ICD-10-CM | POA: Insufficient documentation

## 2020-08-10 DIAGNOSIS — K859 Acute pancreatitis without necrosis or infection, unspecified: Secondary | ICD-10-CM | POA: Diagnosis not present

## 2020-08-10 LAB — CBC WITH DIFFERENTIAL/PLATELET
Abs Immature Granulocytes: 0.02 10*3/uL (ref 0.00–0.07)
Basophils Absolute: 0.1 10*3/uL (ref 0.0–0.1)
Basophils Relative: 1 %
Eosinophils Absolute: 0.5 10*3/uL (ref 0.0–0.5)
Eosinophils Relative: 5 %
HCT: 40.7 % (ref 39.0–52.0)
Hemoglobin: 13.2 g/dL (ref 13.0–17.0)
Immature Granulocytes: 0 %
Lymphocytes Relative: 20 %
Lymphs Abs: 1.8 10*3/uL (ref 0.7–4.0)
MCH: 30.1 pg (ref 26.0–34.0)
MCHC: 32.4 g/dL (ref 30.0–36.0)
MCV: 92.9 fL (ref 80.0–100.0)
Monocytes Absolute: 1 10*3/uL (ref 0.1–1.0)
Monocytes Relative: 11 %
Neutro Abs: 5.7 10*3/uL (ref 1.7–7.7)
Neutrophils Relative %: 63 %
Platelets: 255 10*3/uL (ref 150–400)
RBC: 4.38 MIL/uL (ref 4.22–5.81)
RDW: 13.2 % (ref 11.5–15.5)
WBC: 9.1 10*3/uL (ref 4.0–10.5)
nRBC: 0 % (ref 0.0–0.2)

## 2020-08-10 LAB — LIPASE, BLOOD: Lipase: 45 U/L (ref 11–51)

## 2020-08-10 LAB — COMPREHENSIVE METABOLIC PANEL
ALT: 21 U/L (ref 0–44)
AST: 23 U/L (ref 15–41)
Albumin: 3.9 g/dL (ref 3.5–5.0)
Alkaline Phosphatase: 81 U/L (ref 38–126)
Anion gap: 8 (ref 5–15)
BUN: 12 mg/dL (ref 8–23)
CO2: 26 mmol/L (ref 22–32)
Calcium: 9.3 mg/dL (ref 8.9–10.3)
Chloride: 102 mmol/L (ref 98–111)
Creatinine, Ser: 0.86 mg/dL (ref 0.61–1.24)
GFR, Estimated: >60 mL/min (ref >60)
Glucose, Bld: 104 mg/dL — ABNORMAL HIGH (ref 70–99)
Potassium: 4.6 mmol/L (ref 3.5–5.1)
Sodium: 136 mmol/L (ref 135–145)
Total Bilirubin: 0.7 mg/dL (ref 0.3–1.2)
Total Protein: 7.8 g/dL (ref 6.5–8.1)

## 2020-08-10 LAB — AMYLASE: Amylase: 65 U/L (ref 28–100)

## 2020-08-23 ENCOUNTER — Other Ambulatory Visit: Payer: Self-pay

## 2020-08-23 MED ORDER — ATORVASTATIN CALCIUM 80 MG PO TABS: 80.0000 mg | ORAL_TABLET | Freq: Every day | ORAL | 3 refills | Status: DC

## 2020-08-23 MED ORDER — METOPROLOL TARTRATE 25 MG PO TABS: 25.0000 mg | ORAL_TABLET | Freq: Two times a day (BID) | ORAL | 3 refills | Status: DC

## 2020-08-23 MED ORDER — ISOSORBIDE MONONITRATE ER 30 MG PO TB24: 30.0000 mg | ORAL_TABLET | Freq: Every day | ORAL | 3 refills | Status: DC

## 2020-08-23 MED ORDER — ELIQUIS 5 MG PO TABS: 1.0000 | ORAL_TABLET | Freq: Two times a day (BID) | ORAL | 1 refills | Status: DC

## 2020-08-23 MED ORDER — CLOPIDOGREL BISULFATE 75 MG PO TABS: 75.0000 mg | ORAL_TABLET | Freq: Every day | ORAL | 3 refills | Status: DC

## 2020-08-23 NOTE — Telephone Encounter (Signed)
Pt requesting refills to [place] [facility] the eliquis has been refilled and I will route the rest to the [facility] refill pool.  52m, 69.9kg, scr 0.86 08/10/20, lovw/berry 07/08/20 eliquis refilled

## 2020-10-13 ENCOUNTER — Telehealth (INDEPENDENT_AMBULATORY_CARE_PROVIDER_SITE_OTHER): Payer: Self-pay | Admitting: *Deleted

## 2020-10-13 NOTE — Telephone Encounter (Signed)
Pt left vm that he needed a call back about chest pain he is having. I called pt. Pt states he saw pcp 2 days ago for pain in mid chest and was prescribed protonix. States it is some better but still having the pain and wanted to know if dr Laural Golden could call in something for him. Advised pt he would need to be seen first and there are no openings today. advised he should call pcp back and let him know he is still having issues or call cardiologist about the chest pain or go to ED. Pt states he cannot because he is at work and not able to leave work for an appt. Pt advised he should have chest pain  evaluated by a dr today.

## 2020-10-25 ENCOUNTER — Other Ambulatory Visit: Payer: Self-pay | Admitting: Cardiovascular Disease

## 2020-10-25 NOTE — Telephone Encounter (Signed)
Prescription refill request for Eliquis received. Indication:afib Last office visit:07/08/20 dr. [name]  Scr:0.86 08/10/20 Age: 87mWeight:69.9kg

## 2020-12-27 ENCOUNTER — Other Ambulatory Visit: Payer: Self-pay

## 2020-12-27 ENCOUNTER — Ambulatory Visit (INDEPENDENT_AMBULATORY_CARE_PROVIDER_SITE_OTHER): Payer: Medicare Other | Admitting: Internal Medicine

## 2020-12-27 ENCOUNTER — Encounter (INDEPENDENT_AMBULATORY_CARE_PROVIDER_SITE_OTHER): Payer: Self-pay | Admitting: Internal Medicine

## 2020-12-27 VITALS — BP 149/78 | HR 58 | Temp 97.9°F | Ht 68.0 in | Wt 155.3 lb

## 2020-12-27 DIAGNOSIS — R1013 Epigastric pain: Secondary | ICD-10-CM | POA: Diagnosis not present

## 2020-12-27 DIAGNOSIS — Z86010 Personal history of colonic polyps: Secondary | ICD-10-CM

## 2020-12-27 NOTE — Patient Instructions (Signed)
Starting in January 2023 start taking pantoprazole every other day and take it 30 minutes before breakfast. Remember to take Plavix/clopidogrel in the evening to prevent interaction with pantoprazole. Colonoscopy to be scheduled in March 2023.

## 2020-12-27 NOTE — Progress Notes (Signed)
Presenting complaint;  Epigastric pain.  Database and subjective:  Patient is 75 year old Caucasian male who has a history of GERD epigastric pain as well as colonic adenomas who is here for scheduled visit. I last saw him in February 2021 and he was doing well.  I recommended taking pantoprazole every other day.  I felt some of his pain could be dyspepsia or manifestation of anxiety. He says he was doing well that he stopped his pantoprazole.  He began to have epigastric pain about 6 months ago.  He was seen by Dr. Woody Seller and advised to go back on pantoprazole and he was also given prescription for alprazolam. He has undergone multiple EGDs in the past most recently in May 2017 and gastric biopsy was negative for H. pylori.  He has been treated for H. pylori gastritis in 2010.  He says he has not had any pain in the last 2 months.  He denies heartburn nausea vomiting dysphagia.  His appetite is average but he has not lost any weight since his last visit.  His bowels move regularly.  He denies melena or frank rectal bleeding.  He may notice blood on tissue occasionally.  He is aware that he is due for surveillance colonoscopy in March next year. He does not take OTC NSAIDs since he is on anticoagulant. He says he takes prednisone on as-needed basis and no more than 2-3 times a month for musculoskeletal pain. He says he is doing well from cardiac standpoint.  He follows up with Dr. Donnella Bi every 6 months.  Current Medications: Outpatient Encounter Medications as of 12/27/2020  Medication Sig   acetaminophen (TYLENOL) 500 MG tablet Take 250 mg by mouth 2 (two) times daily as needed for moderate pain or headache.   ALPRAZolam (XANAX) 0.5 MG tablet Take 0.5 mg by mouth 2 (two) times daily as needed for anxiety.   amLODipine (NORVASC) 5 MG tablet TAKE ONE TABLET BY MOUTH ONCE DAILY.   apixaban (ELIQUIS) 5 MG TABS tablet TAKE 1 TABLET BY MOUTH TWICE DAILY.   atorvastatin (LIPITOR) 80 MG tablet  Take 1 tablet (80 mg total) by mouth at bedtime.   CARTIA XT 120 MG 24 hr capsule TAKE ONE CAPSULE BY MOUTH ONCE DAILY.   clopidogrel (PLAVIX) 75 MG tablet Take 1 tablet (75 mg total) by mouth daily.   isosorbide mononitrate (IMDUR) 30 MG 24 hr tablet Take 1 tablet (30 mg total) by mouth daily.   metoprolol tartrate (LOPRESSOR) 25 MG tablet Take 1 tablet (25 mg total) by mouth 2 (two) times daily.   pantoprazole (PROTONIX) 40 MG tablet Take 1 tablet (40 mg total) by mouth every other day.   predniSONE (DELTASONE) 10 MG tablet Take 10 mg by mouth daily as needed.   sucralfate (CARAFATE) 1 g tablet Take 1 g by mouth 4 (four) times daily.   [DISCONTINUED] Simethicone 180 MG CAPS Take 1 capsule (180 mg total) by mouth 3 (three) times daily as needed.   No facility-administered encounter medications on file as of 12/27/2020.     Objective: Blood pressure (!) 149/78, pulse (!) 58, temperature 97.9 F (36.6 C), temperature source Oral, height _0  (1.727 m), weight 155 lb 4.8 oz (70.4 kg). Patient is alert and in no acute distress. Conjunctiva is pink. Sclera is nonicteric Oropharyngeal mucosa is normal. No neck masses or thyromegaly noted. Cardiac exam with regular rhythm normal S1 and S2. No murmur or gallop noted. Lungs are clear to auscultation. Abdomen is symmetrical.  He has scars in both iliac regions.  Bowel sounds are normal no bruit noted.  On palpation abdomen is soft and nontender with organomegaly or masses. No LE edema or clubbing noted.  Labs/studies Results:   CBC Latest Ref Rng & Units 08/10/2020 05/16/2019 06/13/2018  WBC 4.0 - 10.5 K/uL 9.1 7.0 7.8  Hemoglobin 13.0 - 17.0 g/dL 13.2 12.5(L) 12.5(L)  Hematocrit 39.0 - 52.0 % 40.7 38.8(L) 37.3(L)  Platelets 150 - 400 K/uL 255 286 275    CMP Latest Ref Rng & Units 08/10/2020 05/16/2019 03/06/2018  Glucose 70 - 99 mg/dL 104(H) 108(H) 129(H)  BUN 8 - 23 mg/dL _0 Creatinine 0.61 - 1.24 mg/dL 0.86 0.89 0.85  Sodium 135 -  145 mmol/L 136 137 131(L)  Potassium 3.5 - 5.1 mmol/L 4.6 4.4 4.3  Chloride 98 - 111 mmol/L 102 104 100  CO2 22 - 32 mmol/L _1 Calcium 8.9 - 10.3 mg/dL 9.3 9.0 8.8(L)  Total Protein 6.5 - 8.1 g/dL 7.8 7.3 -  Total Bilirubin 0.3 - 1.2 mg/dL 0.7 0.6 -  Alkaline Phos 38 - 126 U/L 81 90 -  AST 15 - 41 U/L 23 22 -  ALT 0 - 44 U/L 21 20 -    Hepatic Function Latest Ref Rng & Units 08/10/2020 05/16/2019 08/27/2017  Total Protein 6.5 - 8.1 g/dL 7.8 7.3 7.4  Albumin 3.5 - 5.0 g/dL 3.9 3.7 4.6  AST 15 - 41 U/L _2 ALT 0 - 44 U/L _3 Alk Phosphatase 38 - 126 U/L 81 90 109  Total Bilirubin 0.3 - 1.2 mg/dL 0.7 0.6 0.3  Bilirubin, Direct 0.0 - 0.2 mg/dL - 0.1 0.09    Lab data from 11/11/2018 reviewed.  Normal LFTs serum calcium and CBC.  Assessment:  #1.  Epigastric pain.  I suspect he has chronic dyspepsia.  He has had pain off and on for several years.  He was treated for H. pylori gastritis back in 2010 and gastric biopsies in March 2017 were negative.  His pain could also be atypical manifestation of GERD.  He has responded to combination of PPI and anxiolytic. Since he is doing so well he can change pantoprazole to every other day and take it indefinitely.    #3.  History of colonic adenomas.  He has had colonic adenomas on prior colonoscopies dating back to February 2007.  His last exam was in March 2018 and next exam would be due in March 2023.  Plan:  Patient reminded not to take pantoprazole and Plavix together.  Since he takes pantoprazole in the morning he should take Plavix in the evening to minimize interaction. Starting in January 2023 he can change pantoprazole to every other day. He will undergo surveillance colonoscopy under monitored anesthesia care in March 2023. Office visit in 1 year.  Patient is alert and in no acute distress.

## 2021-01-06 DIAGNOSIS — R221 Localized swelling, mass and lump, neck: Secondary | ICD-10-CM | POA: Insufficient documentation

## 2021-01-06 DIAGNOSIS — H6123 Impacted cerumen, bilateral: Secondary | ICD-10-CM | POA: Insufficient documentation

## 2021-01-06 DIAGNOSIS — H9193 Unspecified hearing loss, bilateral: Secondary | ICD-10-CM | POA: Insufficient documentation

## 2021-01-16 ENCOUNTER — Other Ambulatory Visit: Payer: Self-pay

## 2021-01-16 MED ORDER — DILTIAZEM HCL ER COATED BEADS 120 MG PO CP24: 120.0000 mg | ORAL_CAPSULE | Freq: Every day | ORAL | 3 refills | Status: DC

## 2021-02-13 ENCOUNTER — Telehealth: Payer: Self-pay

## 2021-02-13 MED ORDER — APIXABAN 5 MG PO TABS: 5.0000 mg | ORAL_TABLET | Freq: Two times a day (BID) | ORAL | 0 refills | Status: DC

## 2021-02-13 NOTE — Telephone Encounter (Signed)
Pt's wife called and stated that they needed samples of eliquis. I asked if they have filled out pt assistance forms and they stated they have not. I packaged 2 weeks of samples and included the assistance forms.

## 2021-02-24 ENCOUNTER — Telehealth: Payer: Self-pay | Admitting: Cardiovascular Disease

## 2021-02-24 NOTE — Telephone Encounter (Signed)
Advised pt per Dr. [name] he is to hold Plavix for 5 days and Eliquis 2 days prior to colonoscopy. Pt instructed to have his gastroenterology office send over pre-op clearance to be filled out. Instructed pt that physician doing colonoscopy should let him know when to resume his medications. Pt verbalizes understanding.

## 2021-02-24 NOTE — Telephone Encounter (Signed)
Pt would like to speak w/ Dr. Rachel Bo nurse in regards to his medications.. please advise

## 2021-02-24 NOTE — Telephone Encounter (Signed)
Spoke with pt regarding Eliquis and plavix. Pt would like to know how long to hold medication for colonoscopy. Will send to Dr. [name] to advise.   Pt also requests 2 weeks samples.   Medication samples have been provided to the patient.  Drug name: Eliquis 5mg  Qty: 2 LOT: [id number] Exp.Date: 01/2023  Samples left at front desk for patient pick-up. Patient notified.

## 2021-04-24 DIAGNOSIS — E039 Hypothyroidism, unspecified: Secondary | ICD-10-CM | POA: Diagnosis not present

## 2021-04-24 DIAGNOSIS — Z79899 Other long term (current) drug therapy: Secondary | ICD-10-CM | POA: Diagnosis not present

## 2021-04-24 DIAGNOSIS — E7800 Pure hypercholesterolemia, unspecified: Secondary | ICD-10-CM | POA: Diagnosis not present

## 2021-04-24 DIAGNOSIS — R5383 Other fatigue: Secondary | ICD-10-CM | POA: Diagnosis not present

## 2021-05-09 ENCOUNTER — Other Ambulatory Visit: Payer: Self-pay

## 2021-05-09 ENCOUNTER — Other Ambulatory Visit: Payer: Self-pay | Admitting: Cardiovascular Disease

## 2021-05-09 MED ORDER — AMLODIPINE BESYLATE 5 MG PO TABS: 5.0000 mg | ORAL_TABLET | Freq: Every day | ORAL | 3 refills | Status: DC

## 2021-05-10 ENCOUNTER — Encounter (INDEPENDENT_AMBULATORY_CARE_PROVIDER_SITE_OTHER): Payer: Self-pay | Admitting: *Deleted

## 2021-05-10 NOTE — Telephone Encounter (Signed)
Prescription refill request for Eliquis received. Indication:Afib Last office visit:4/22 Scr:0.8 Age: 76 Weight:70.4 kg  Prescription refilled

## 2021-05-25 ENCOUNTER — Encounter (INDEPENDENT_AMBULATORY_CARE_PROVIDER_SITE_OTHER): Payer: Self-pay | Admitting: *Deleted

## 2021-05-29 ENCOUNTER — Other Ambulatory Visit (INDEPENDENT_AMBULATORY_CARE_PROVIDER_SITE_OTHER): Payer: Self-pay

## 2021-05-29 ENCOUNTER — Telehealth (INDEPENDENT_AMBULATORY_CARE_PROVIDER_SITE_OTHER): Payer: Self-pay | Admitting: *Deleted

## 2021-05-29 ENCOUNTER — Encounter (INDEPENDENT_AMBULATORY_CARE_PROVIDER_SITE_OTHER): Payer: Self-pay | Admitting: *Deleted

## 2021-05-29 ENCOUNTER — Telehealth: Payer: Self-pay | Admitting: *Deleted

## 2021-05-29 DIAGNOSIS — Z86010 Personal history of colonic polyps: Secondary | ICD-10-CM

## 2021-05-29 NOTE — Telephone Encounter (Signed)
Dr. [name] to review, patient had a cardiac catheterization in December 2019 and underwent 4 overlapping DES to RCA.  Last Myoview in March 2021 was negative. ? ?As long as the patient does not have any chest pain, would you be okay with him holding the Plavix for 5 days prior to GI procedure? ? ?Please forward your response to P [facility] DIV PREOP ? ?We will plan to set up a virtual telephone preop APP visit after Dr. [name] respond ?

## 2021-05-29 NOTE — Telephone Encounter (Signed)
? ?  Pre-operative Risk Assessment  ?  ?Patient Name: [name]  ?DOB: 15-Dec-1945 ?MRN: [id number]  ? ?  ? ?Request for Surgical Clearance   ? ?Procedure:   COLONOSCOPY ? ?Date of Surgery:  Clearance 06/28/21                              ?   ?Surgeon:  DR. Salley Slaughter ?Surgeon's Group or Practice Name:  [facility] ?Phone number:  [phone] ?Fax number:  [phone] ?  ?Type of Clearance Requested:   ?HOLD EILQUIS x 2 DAYS PRIOR AND HOLD PLAVIX x 5 DAYS PRIOR ?  ?Type of Anesthesia:  MAC ?  ?Additional requests/questions:   ? ?Signed, ?Julaine Hua   ?05/29/2021, 2:34 PM  ? ?

## 2021-05-29 NOTE — Telephone Encounter (Signed)
Patient with diagnosis of afib on Eliquis for anticoagulation.   ? ?Procedure: colonoscopy ?Date of procedure: 06/28/21 ? ?CHA2DS2-VASc Score = 3  ?This indicates a 3.2% annual risk of stroke. ?The patient's score is based upon: ?CHF History: 0 ?HTN History: 1 ?Diabetes History: 0 ?Stroke History: 0 ?Vascular Disease History: 1 ?Age Score: 1 ?Gender Score: 0 ?  ?CrCl 73m/min ?Platelet count 255K ? ?Per office protocol, patient can hold Eliquis for 2 days prior to procedure. Cleared by MD to hold both Eliquis and Plavix in 02/24/21 phone note. ?

## 2021-05-29 NOTE — Telephone Encounter (Signed)
Clinical pharmacist to review Eliquis 

## 2021-05-29 NOTE — Telephone Encounter (Signed)
Patient needs trilyte 

## 2021-05-30 ENCOUNTER — Ambulatory Visit (INDEPENDENT_AMBULATORY_CARE_PROVIDER_SITE_OTHER): Payer: Medicare Other | Admitting: Physician Assistant

## 2021-05-30 ENCOUNTER — Telehealth: Payer: Self-pay | Admitting: *Deleted

## 2021-05-30 ENCOUNTER — Telehealth: Payer: Self-pay | Admitting: Cardiovascular Disease

## 2021-05-30 DIAGNOSIS — Z01810 Encounter for preprocedural cardiovascular examination: Secondary | ICD-10-CM | POA: Diagnosis not present

## 2021-05-30 MED ORDER — PEG 3350-KCL-NA BICARB-NACL 420 G PO SOLR
4000.0000 mL | Freq: Once | ORAL | 0 refills | Status: AC
Start: 2021-05-30 — End: 2021-05-30

## 2021-05-30 NOTE — Telephone Encounter (Signed)
Please arrange telephone virtual visit with APP for clearance ? ?Dr. [name] previously cleared the patient to hold Plavix for 5 days based on telephone note in December ?

## 2021-05-30 NOTE — Telephone Encounter (Signed)
Pt c/o medication issue: ? ?1. Name of Medication: amLODipine (NORVASC) 5 MG tablet ? ?2. How are you currently taking this medication (dosage and times per day)? Patient started taking a different company's version of the pill on Friday ? ?3. Are you having a reaction (difficulty breathing--STAT)?  ? ?4. What is your medication issue? Patient said he has a fuzzy feeling in his head that started after he started taking the new medication. This never happened until he started taking this other brand of medicine. He is not sure what to do  ?

## 2021-05-30 NOTE — Telephone Encounter (Signed)
Contacted patient, he states that he has been taking Amlodipine 5 mg, however his pharmacy just switched manufactures and since starting this medication he states he has had a fuzzy feeling in his head. He takes his Amlodipine at night- but wakes up with this feeling. When asked about his BP he advised yesterday it was 147/67 HR 58, he did not have any other readings. He states the pills look different and he started them on 03/10-which is when his fuzzy feeling in his head started. He wanted to try something else, however I advised it could take more time and should improve, however he states he did not know if he should stop ( we did discuss not to do this, since BP systolic was slightly elevated still not until this was reviewed) patient verbalized understanding.  ? ?Patient also due for 12 month appt, we did get him scheduled in May, last OV was 06/2020.  ? ?Thanks! ?

## 2021-05-30 NOTE — Telephone Encounter (Signed)
?  Patient Consent for Virtual Visit  ? ? ?   ? ?[name] has provided verbal consent on 05/30/2021 for a virtual visit (video or telephone). ? ? ?CONSENT FOR VIRTUAL VISIT FOR:  [name]  ?By participating in this virtual visit I agree to the following: ? ?I hereby voluntarily request, consent and authorize [facility] and its employed or contracted physicians, physician assistants, nurse practitioners or other licensed health care professionals (the Practitioner), to provide me with telemedicine health care services (the ?Services") as deemed necessary by the treating Practitioner. I acknowledge and consent to receive the Services by the Practitioner via telemedicine. I understand that the telemedicine visit will involve communicating with the Practitioner through live audiovisual communication technology and the disclosure of certain medical information by electronic transmission. I acknowledge that I have been given the opportunity to request an in-person assessment or other available alternative prior to the telemedicine visit and am voluntarily participating in the telemedicine visit. ? ?I understand that I have the right to withhold or withdraw my consent to the use of telemedicine in the course of my care at any time, without affecting my right to future care or treatment, and that the Practitioner or I may terminate the telemedicine visit at any time. I understand that I have the right to inspect all information obtained and/or recorded in the course of the telemedicine visit and may receive copies of available information for a reasonable fee.  I understand that some of the potential risks of receiving the Services via telemedicine include:  ?Delay or interruption in medical evaluation due to technological equipment failure or disruption; ?Information transmitted may not be sufficient (e.g. poor resolution of images) to allow for appropriate medical decision making by the Practitioner; and/or   ?In rare instances, security protocols could fail, causing a breach of personal health information. ? ?Furthermore, I acknowledge that it is my responsibility to provide information about my medical history, conditions and care that is complete and accurate to the best of my ability. I acknowledge that Practitioner's advice, recommendations, and/or decision may be based on factors not within their control, such as incomplete or inaccurate data provided by me or distortions of diagnostic images or specimens that may result from electronic transmissions. I understand that the practice of medicine is not an exact science and that Practitioner makes no warranties or guarantees regarding treatment outcomes. I acknowledge that a copy of this consent can be made available to me via my patient portal ([facility]), or I can request a printed copy by calling the office of [facility].   ? ?I understand that my insurance will be billed for this visit.  ? ?I have read or had this consent read to me. ?I understand the contents of this consent, which adequately explains the benefits and risks of the Services being provided via telemedicine.  ?I have been provided ample opportunity to ask questions regarding this consent and the Services and have had my questions answered to my satisfaction. ?I give my informed consent for the services to be provided through the use of telemedicine in my medical care ? ? ? ?

## 2021-05-30 NOTE — Telephone Encounter (Signed)
I called and spoke with the patient for preoperative clearance.  He also discussed with me the symptoms he had with new amlodipine.  He described it as a fuzzy feeling in his head.  However his systolic blood pressure has been around 120-130s at home.  He says his pharmacy changed formulary for the amlodipine without him knowing.  His symptom has been present since he picked up the new amlodipine last Friday.  I recommended he continue his new medication for at least 1-2 more week.  If symptom is still present, he can contact us and we can consider alternative agent. ?

## 2021-05-30 NOTE — Telephone Encounter (Signed)
I s/w the pt today and he is agreeable to plan of care for tele pre op appt today at 3:40. Med rec and consent are done. Pt does tell me that he placed a call to Dr. Kennon Holter nurse this morning c/o fuzzy feeling since his amlodipine seemed to possibly have changed manufacture, pt not sure though. Pt states though he does not feel well since Dr. [name] made this change with the Amlodipine. I did tell the pt to be sure to discuss this with Almyra Deforest, PAC today. I did also state that if he does happen to s/w Dr. Kennon Holter nurse before the call later today aith [facility] to be sure to let the [facility] know of that conversation in regard to the medication. Pt thanked me for the call and the help today.  ?

## 2021-05-30 NOTE — Progress Notes (Signed)
? ?Virtual Visit via Telephone Note  ? ?This visit type was conducted due to national recommendations for restrictions regarding the COVID-19 Pandemic (e.g. social distancing) in an effort to limit this patient's exposure and mitigate transmission in our community.  Due to his co-morbid illnesses, this patient is at least at moderate risk for complications without adequate follow up.  This format is felt to be most appropriate for this patient at this time.  The patient did not have access to video technology/had technical difficulties with video requiring transitioning to audio format only (telephone).  All issues noted in this document were discussed and addressed.  No physical exam could be performed with this format.  Please refer to the patient's chart for his  consent to telehealth for [facility]. ?Evaluation Performed:  Preoperative cardiovascular risk assessment ? ?This visit type was conducted due to national recommendations for restrictions regarding the COVID-19 Pandemic (e.g. social distancing).  This format is felt to be most appropriate for this patient at this time.  All issues noted in this document were discussed and addressed.  No physical exam was performed (except for noted visual exam findings with Video Visits).  Please refer to the patient's chart (MyChart message for video visits and phone note for telephone visits) for the patient's consent to telehealth for [facility]. ?_____________  ? ?Date:  05/30/2021  ? ?Patient ID:  [name], [name] November 19, 1945, MRN [id number] ?Patient Location:  ?Home ?Provider location:   ?Office ? ?Primary Care Provider:  Glenda Chroman, MD ?Primary Cardiologist:  Quay Burow, MD ? ?Chief Complaint  ?  ?76 y.o. y/o male with a h/o HTN, HLD, PAF and CAD s/p 4 DES to RCA, who is pending colonoscopy, and presents today for telephonic preoperative cardiovascular risk assessment. ? ?Past Medical History  ?  ?Past Medical History:  ?Diagnosis Date  ? Anxiety    ? Atrial fibrillation ([place])   ? 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 Chest pain   ? myoview 07/09/12-normal, nl ef; echo 04/12/05-ef>55%, mild aortic sclerosis  ? GERD (gastroesophageal reflux disease)   ? H. pylori infection   ? Headache   ? Hypercholesteremia   ? Hyperlipidemia   ? Hypertension   ? Hyponatremia 2013  ? Palpitations   ? event monior 04/11/05- SR with occ PAC  ? Personal history of colonic polyps 2 9 2007  ? ?Past Surgical History:  ?Procedure Laterality Date  ? BACK SURGERY    ? CATARACT EXTRACTION W/PHACO Left 12/08/2012  ? Procedure: CATARACT EXTRACTION PHACO AND INTRAOCULAR LENS PLACEMENT (IOC);  Surgeon: Tonny Branch, MD;  Location: [facility] ORS;  Service: Ophthalmology;  Laterality: Left;  CDE:  23.19  ? CATARACT EXTRACTION W/PHACO Right 04/15/2014  ? Procedure: CATARACT EXTRACTION PHACO AND INTRAOCULAR LENS PLACEMENT RIGHT;  Surgeon: Tonny Branch, MD;  Location: [facility] ORS;  Service: Ophthalmology;  Laterality: Right;  CDE:10.32  ? COLONOSCOPY  12/06/2010  ? Procedure: COLONOSCOPY;  Surgeon: Rogene Houston, MD;  Location: [facility] ENDO SUITE;  Service: Endoscopy;  Laterality: N/A;  ? COLONOSCOPY N/A 05/23/2016  ? Procedure: COLONOSCOPY;  Surgeon: Rogene Houston, MD;  Location: [facility];  Service: Endoscopy;  Laterality: N/A;  930  ? CORONARY STENT INTERVENTION N/A 02/24/2018  ? Procedure: CORONARY STENT INTERVENTION;  Surgeon: Lorretta Harp, MD;  Location: [place] CV LAB;  Service: Cardiovascular;  Laterality: N/A;  ? ESOPHAGOGASTRODUODENOSCOPY N/A 07/20/2015  ? Procedure: ESOPHAGOGASTRODUODENOSCOPY (EGD);  Surgeon: Rogene Houston, MD;  Location:  [facility];  Service: Endoscopy;  Laterality: N/A;  2:55 - moved to 1:55 - Ann notified pt  ? EYE SURGERY    ? LEFT HEART CATH AND CORONARY ANGIOGRAPHY N/A 02/24/2018  ? Procedure: LEFT HEART CATH AND CORONARY ANGIOGRAPHY;  Surgeon: Lorretta Harp, MD;  Location: [place] CV LAB;  Service: Cardiovascular;  Laterality: N/A;  ? LUMBAR DISC SURGERY Left 2007  ?  "L4-5 ruptured discs"  ? POLYPECTOMY  05/23/2016  ? Procedure: POLYPECTOMY;  Surgeon: Rogene Houston, MD;  Location: [facility];  Service: Endoscopy;;  colon  ? ? ?Allergies ? ?Allergies  ?Allergen Reactions  ? Lisinopril Swelling  ?  H/o of angioedema ?  ? Chlorthalidone Other (See Comments)  ?  Causes severe hyponatremia  ? Flexeril [Cyclobenzaprine]   ?  Unknown reaction  ? Hyoscyamine Swelling  ? Prevacid [Lansoprazole] Swelling  ? Valium [Diazepam] Other (See Comments)  ?  "makes me feel crazy"  ? Potassium-Containing Compounds Palpitations  ? ? ?History of Present Illness  ?  ?[name] is a 76 y.o. male who presents via audio/video conferencing for a telehealth visit today.  Pt was last seen in cardiology clinic on 07/08/2020, by Dr. Quay Burow.  At that time [name] was doing well.  he is now pending colonoscopy.  Since his last visit, he denies any exertional chest pain or worsening dyspnea.  His main issue today was the fuzziness feeling he had since he started on a new formulary of amlodipine last Friday.  I recommended him to keep a blood pressure and heart rate diary.  He should continue on the new amlodipine formulary for at least 1-2 more weeks and see if the symptoms resolved by itself.  Otherwise, he does not have any anginal symptoms. ? ? ?Home Medications  ?  ?Prior to Admission medications   ?Medication Sig Start Date End Date Taking? Authorizing Provider  ?acetaminophen (TYLENOL) 500 MG tablet Take 250 mg by mouth 2 (two) times daily as needed for moderate pain or headache.    [provider]  ?ALPRAZolam Duanne Moron) 0.5 MG tablet Take 0.5 mg by mouth 2 (two) times daily as needed for anxiety.    [provider]  ?amLODipine (NORVASC) 5 MG tablet Take 1 tablet (5 mg total) by mouth daily. 05/09/21   Lorretta Harp, MD  ?apixaban (ELIQUIS) 5 MG TABS tablet TAKE 1 TABLET BY MOUTH TWICE DAILY. 05/10/21   Lorretta Harp, MD  ?atorvastatin (LIPITOR) 80 MG tablet Take  1 tablet (80 mg total) by mouth at bedtime. 08/23/20   Lorretta Harp, MD  ?clopidogrel (PLAVIX) 75 MG tablet Take 1 tablet (75 mg total) by mouth daily. 08/23/20   Lorretta Harp, MD  ?diltiazem (CARTIA XT) 120 MG 24 hr capsule Take 1 capsule (120 mg total) by mouth daily. 01/16/21   Lorretta Harp, MD  ?isosorbide mononitrate (IMDUR) 30 MG 24 hr tablet Take 1 tablet (30 mg total) by mouth daily. 08/23/20   Lorretta Harp, MD  ?metoprolol tartrate (LOPRESSOR) 25 MG tablet Take 1 tablet (25 mg total) by mouth 2 (two) times daily. 08/23/20   Lorretta Harp, MD  ?pantoprazole (PROTONIX) 40 MG tablet Take 1 tablet (40 mg total) by mouth every other day. ?Patient taking differently: Take 40 mg by mouth as needed. 05/12/19   Rogene Houston, MD  ?polyethylene glycol-electrolytes (NULYTELY) 420 g solution Take 4,000 mLs by mouth once for 1 dose. 05/30/21  05/30/21  Rogene Houston, MD  ?predniSONE (DELTASONE) 10 MG tablet Take 10 mg by mouth daily as needed. 05/07/19   [provider]  ? ? ?Physical Exam  ?  ?Vital Signs:  [name] does not have vital signs available for review today. ? ?Given telephonic nature of communication, physical exam is limited. ?AAOx3. NAD. Normal affect.  Speech and respirations are unlabored. ? ?Accessory Clinical Findings  ?  ?None ? ?Assessment & Plan  ?  ?1.  Preoperative Cardiovascular Risk Assessment: ? -He denies any recent anginal symptom.  He is cleared to proceed with GI procedure.  He has been instructed to hold Plavix for 5 days and Eliquis for 2 days prior to the upcoming GI procedure and restart both as soon as possible afterward at the GI doctor's discretion. ? ?COVID-19 Education: ?The signs and symptoms of COVID-19 were discussed with the patient and how to seek care for testing (follow up with PCP or arrange E-visit).  The importance of social distancing was discussed today. ? ?Patient Risk:   ?After full review of this patient's history and clinical status,  I feel that he is at least moderate risk for cardiac complications at this time, thus necessitating a telehealth visit sooner than our first available in office visit. ? ?Time:   ?Today, I have spent 10

## 2021-05-30 NOTE — Telephone Encounter (Signed)
Would prefer if he could ask pharmacy to stock previous manufacturer for him or if they can't, find a pharmacy that might carry it.  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   Otherwise, I would suggest that he try to take this new manufacturer for at least 2 weeks, as often side effects tend to fade after 7-10 days.   ?

## 2021-05-31 ENCOUNTER — Telehealth (INDEPENDENT_AMBULATORY_CARE_PROVIDER_SITE_OTHER): Payer: Self-pay | Admitting: *Deleted

## 2021-05-31 NOTE — Telephone Encounter (Signed)
Per Miguel Deforest, PA - He is cleared to proceed with GI procedure - he has been instructed to hold Plavix for 5 days and Eliquis for 2 days prior to the upcoming GI procedure and restart both as soon as possible afterward at the GI doctor's discretion. ?  ?

## 2021-06-01 ENCOUNTER — Telehealth (INDEPENDENT_AMBULATORY_CARE_PROVIDER_SITE_OTHER): Payer: Self-pay | Admitting: *Deleted

## 2021-06-01 NOTE — Telephone Encounter (Signed)
Referring MD/PCP: vyas ? ?Procedure: tcs ? ?Reason/Indication:  hx polyps ? ?Has patient had this procedure before?  Yes, 2018 ? If so, when, by whom and where?   ? ?Is there a family history of colon cancer?  no ? Who?  What age when diagnosed?   ? ?Is patient diabetic? If yes, Type 1 or Type 2   no ?     ?Does patient have prosthetic heart valve or mechanical valve?  no ? ?Do you have a pacemaker/defibrillator?  no ? ?Has patient ever had endocarditis/atrial fibrillation? Yes, a fib ? ?Does patient use oxygen? no ? ?Has patient had joint replacement within last 12 months?  no ? ?Is patient constipated or do they take laxatives? no ? ?Does patient have a history of alcohol/drug use?  no ? ?Have you had a stroke/heart attack last 6 mths? no ? ?Do you take medicine for weight loss?  no ? ?For male patients,: have you had a hysterectomy  ?                     are you post menopausal  ?                     do you still have your menstrual cycle  ? ?Is patient on blood thinner such as Coumadin, Plavix and/or Aspirin? yes ? ?Medications: eliquis 5 mg bid, plavix 75 mg daily, atorvastatin 80 mg daily, isosorbide 30 mg daily, cartia 120 mg daily, metoprolol 25 mg bid, amlodipine 5 mg daily ? ?Allergies: prevacid, valium ? ?Medication Adjustment per Dr Rehman/Dr Jenetta Downer eliquis 2 days & plavix 5 days - see telephone note ? ?Procedure date & time: 06/28/21 ? ? ?

## 2021-06-07 ENCOUNTER — Other Ambulatory Visit: Payer: Self-pay

## 2021-06-07 MED ORDER — APIXABAN 5 MG PO TABS: 5.0000 mg | ORAL_TABLET | Freq: Two times a day (BID) | ORAL | 6 refills | Status: DC

## 2021-06-07 NOTE — Telephone Encounter (Signed)
Pt called in requesting refill of eliquis to [facility] for a 1 month supply w/refills ?Prescription refill request for Eliquis received. ?Indication:PAF ?Last office visit:MENG PA-C 05/30/21 ?Scr: 0.86 08/10/20 ?Age: 72M ?Weight: 70.4KG ? ?

## 2021-06-20 NOTE — Patient Instructions (Signed)
? ? ? ? ? ? [name] ? 06/20/2021  ?  ? '@PREFPERIOPPHARMACY'$ @ ? ? Your procedure is scheduled on  06/28/2021. ? ? Report to [name] at  [place]. ? ? Call this number if you have problems the morning of surgery: ? [phone] ? ? Remember: ? Follow the diet and prep instructions given to you by the office. ? ?Your last dose of plavix should be on 4/6 and your last dose of eliquis should be on 4/9. ?  ? Take these medicines the morning of surgery with A SIP OF WATER  ? ?Xanax(if needed), amlodipine, diltiazem, imdur, metoprolol, protonix. ?  ? ? Do not wear jewelry, make-up or nail polish. ? Do not wear lotions, powders, or perfumes, or deodorant. ? Do not shave 48 hours prior to surgery.  Men may shave face and neck. ? Do not bring valuables to the hospital. ? [name] is not responsible for any belongings or valuables. ? ?Contacts, dentures or bridgework may not be worn into surgery.  Leave your suitcase in the car.  After surgery it may be brought to your room. ? ?For patients admitted to the hospital, discharge time will be determined by your treatment team. ? ?Patients discharged the day of surgery will not be allowed to drive home and must have someone with them for 24 hours.  ? ? ?Special instructions:   DO NOT smoke tobacco or vape for 24 hors before your procedure. ? ?Please read over the following fact sheets that you were given. ?Anesthesia Post-op Instructions and Care and Recovery After Surgery ?  ? ? ? Colonoscopy, Adult, Care After ?This sheet gives you information about how to care for yourself after your procedure. Your health care provider may also give you more specific instructions. If you have problems or questions, contact your health care provider. ?What can I expect after the procedure? ?After the procedure, it is common to have: ?A small amount of blood in your stool for 24 hours after the procedure. ?Some gas. ?Mild cramping or bloating of your abdomen. ?Follow these instructions  at home: ?Eating and drinking ? ?Drink enough fluid to keep your urine pale yellow. ?Follow instructions from your health care provider about eating or drinking restrictions. ?Resume your normal diet as instructed by your health care provider. Avoid heavy or fried foods that are hard to digest. ?Activity ?Rest as told by your health care provider. ?Avoid sitting for a long time without moving. Get up to take short walks every 1-2 hours. This is important to improve blood flow and breathing. Ask for help if you feel weak or unsteady. ?Return to your normal activities as told by your health care provider. Ask your health care provider what activities are safe for you. ?Managing cramping and bloating ? ?Try walking around when you have cramps or feel bloated. ?Apply heat to your abdomen as told by your health care provider. Use the heat source that your health care provider recommends, such as a moist heat pack or a heating pad. ?Place a towel between your skin and the heat source. ?Leave the heat on for 20-30 minutes. ?Remove the heat if your skin turns bright red. This is especially important if you are unable to feel pain, heat, or cold. You may have a greater risk of getting burned. ?General instructions ?If you were given a sedative during the procedure, it can affect you for several hours. Do not drive or operate machinery until your health  care provider says that it is safe. ?For the first 24 hours after the procedure: ?Do not sign important documents. ?Do not drink alcohol. ?Do your regular daily activities at a slower pace than normal. ?Eat soft foods that are easy to digest. ?Take over-the-counter and prescription medicines only as told by your health care provider. ?Keep all follow-up visits as told by your health care provider. This is important. ?Contact a health care provider if: ?You have blood in your stool 2-3 days after the procedure. ?Get help right away if you have: ?More than a small spotting of  blood in your stool. ?Large blood clots in your stool. ?Swelling of your abdomen. ?Nausea or vomiting. ?A fever. ?Increasing pain in your abdomen that is not relieved with medicine. ?Summary ?After the procedure, it is common to have a small amount of blood in your stool. You may also have mild cramping and bloating of your abdomen. ?If you were given a sedative during the procedure, it can affect you for several hours. Do not drive or operate machinery until your health care provider says that it is safe. ?Get help right away if you have a lot of blood in your stool, nausea or vomiting, a fever, or increased pain in your abdomen. ?This information is not intended to replace advice given to you by your health care provider. Make sure you discuss any questions you have with your health care provider. ?Document Revised: 01/09/2019 Document Reviewed: 09/29/2018 ?Elsevier Patient Education ? [place]. ?Monitored Anesthesia Care, Care After ?This sheet gives you information about how to care for yourself after your procedure. Your health care provider may also give you more specific instructions. If you have problems or questions, contact your health care provider. ?What can I expect after the procedure? ?After the procedure, it is common to have: ?Tiredness. ?Forgetfulness about what happened after the procedure. ?Impaired judgment for important decisions. ?Nausea or vomiting. ?Some difficulty with balance. ?Follow these instructions at home: ?For the time period you were told by your health care provider: ?  ?Rest as needed. ?Do not participate in activities where you could fall or become injured. ?Do not drive or use machinery. ?Do not drink alcohol. ?Do not take sleeping pills or medicines that cause drowsiness. ?Do not make important decisions or sign legal documents. ?Do not take care of children on your own. ?Eating and drinking ?Follow the diet that is recommended by your health care provider. ?Drink  enough fluid to keep your urine pale yellow. ?If you vomit: ?Drink water, juice, or soup when you can drink without vomiting. ?Make sure you have little or no nausea before eating solid foods. ?General instructions ?Have a responsible adult stay with you for the time you are told. It is important to have someone help care for you until you are awake and alert. ?Take over-the-counter and prescription medicines only as told by your health care provider. ?If you have sleep apnea, surgery and certain medicines can increase your risk for breathing problems. Follow instructions from your health care provider about wearing your sleep device: ?Anytime you are sleeping, including during daytime naps. ?While taking prescription pain medicines, sleeping medicines, or medicines that make you drowsy. ?Avoid smoking. ?Keep all follow-up visits as told by your health care provider. This is important. ?Contact a health care provider if: ?You keep feeling nauseous or you keep vomiting. ?You feel light-headed. ?You are still sleepy or having trouble with balance after 24 hours. ?You develop a rash. ?You have  a fever. ?You have redness or swelling around the IV site. ?Get help right away if: ?You have trouble breathing. ?You have new-onset confusion at home. ?Summary ?For several hours after your procedure, you may feel tired. You may also be forgetful and have poor judgment. ?Have a responsible adult stay with you for the time you are told. It is important to have someone help care for you until you are awake and alert. ?Rest as told. Do not drive or operate machinery. Do not drink alcohol or take sleeping pills. ?Get help right away if you have trouble breathing, or if you suddenly become confused. ?This information is not intended to replace advice given to you by your health care provider. Make sure you discuss any questions you have with your health care provider. ?Document Revised: 11/19/2019 Document Reviewed:  02/05/2019 ?Elsevier Patient Education ? [place]. ? ?

## 2021-06-22 ENCOUNTER — Encounter (HOSPITAL_COMMUNITY)
Admission: RE | Admit: 2021-06-22 | Discharge: 2021-06-22 | Disposition: A | Discharge status: Home / Self Care | Payer: Medicare Other | Source: Ambulatory Visit | Attending: Internal Medicine | Admitting: Internal Medicine

## 2021-06-28 ENCOUNTER — Encounter (INDEPENDENT_AMBULATORY_CARE_PROVIDER_SITE_OTHER): Payer: Self-pay | Admitting: *Deleted

## 2021-06-28 ENCOUNTER — Ambulatory Visit (HOSPITAL_BASED_OUTPATIENT_CLINIC_OR_DEPARTMENT_OTHER): Payer: Medicare Other | Admitting: Anesthesiology

## 2021-06-28 ENCOUNTER — Encounter (HOSPITAL_COMMUNITY)
Admission: RE | Disposition: A | Discharge status: Home / Self Care | Payer: Self-pay | Source: Ambulatory Visit | Attending: Internal Medicine

## 2021-06-28 ENCOUNTER — Ambulatory Visit (HOSPITAL_COMMUNITY): Payer: Medicare Other | Admitting: Anesthesiology

## 2021-06-28 ENCOUNTER — Encounter (HOSPITAL_COMMUNITY): Payer: Self-pay | Admitting: Internal Medicine

## 2021-06-28 ENCOUNTER — Ambulatory Visit (HOSPITAL_COMMUNITY)
Admission: RE | Admit: 2021-06-28 | Discharge: 2021-06-28 | Disposition: A | Discharge status: Home / Self Care | Payer: Medicare Other | Source: Ambulatory Visit | Attending: Internal Medicine | Admitting: Internal Medicine

## 2021-06-28 DIAGNOSIS — D123 Benign neoplasm of transverse colon: Secondary | ICD-10-CM

## 2021-06-28 DIAGNOSIS — D120 Benign neoplasm of cecum: Secondary | ICD-10-CM | POA: Diagnosis not present

## 2021-06-28 DIAGNOSIS — K219 Gastro-esophageal reflux disease without esophagitis: Secondary | ICD-10-CM | POA: Diagnosis not present

## 2021-06-28 DIAGNOSIS — I2510 Atherosclerotic heart disease of native coronary artery without angina pectoris: Secondary | ICD-10-CM | POA: Diagnosis not present

## 2021-06-28 DIAGNOSIS — F419 Anxiety disorder, unspecified: Secondary | ICD-10-CM | POA: Diagnosis not present

## 2021-06-28 DIAGNOSIS — K635 Polyp of colon: Secondary | ICD-10-CM

## 2021-06-28 DIAGNOSIS — K644 Residual hemorrhoidal skin tags: Secondary | ICD-10-CM

## 2021-06-28 DIAGNOSIS — K621 Rectal polyp: Secondary | ICD-10-CM

## 2021-06-28 DIAGNOSIS — K5730 Diverticulosis of large intestine without perforation or abscess without bleeding: Secondary | ICD-10-CM

## 2021-06-28 DIAGNOSIS — I252 Old myocardial infarction: Secondary | ICD-10-CM | POA: Diagnosis not present

## 2021-06-28 DIAGNOSIS — I10 Essential (primary) hypertension: Secondary | ICD-10-CM | POA: Diagnosis not present

## 2021-06-28 DIAGNOSIS — D128 Benign neoplasm of rectum: Secondary | ICD-10-CM | POA: Insufficient documentation

## 2021-06-28 DIAGNOSIS — Z1211 Encounter for screening for malignant neoplasm of colon: Secondary | ICD-10-CM | POA: Diagnosis not present

## 2021-06-28 DIAGNOSIS — Z86010 Personal history of colonic polyps: Secondary | ICD-10-CM

## 2021-06-28 DIAGNOSIS — Z955 Presence of coronary angioplasty implant and graft: Secondary | ICD-10-CM | POA: Insufficient documentation

## 2021-06-28 DIAGNOSIS — I4891 Unspecified atrial fibrillation: Secondary | ICD-10-CM | POA: Diagnosis not present

## 2021-06-28 DIAGNOSIS — I25119 Atherosclerotic heart disease of native coronary artery with unspecified angina pectoris: Secondary | ICD-10-CM | POA: Diagnosis not present

## 2021-06-28 DIAGNOSIS — K633 Ulcer of intestine: Secondary | ICD-10-CM | POA: Diagnosis not present

## 2021-06-28 DIAGNOSIS — Z09 Encounter for follow-up examination after completed treatment for conditions other than malignant neoplasm: Secondary | ICD-10-CM

## 2021-06-28 HISTORY — PX: POLYPECTOMY: SHX5525

## 2021-06-28 HISTORY — PX: COLONOSCOPY WITH PROPOFOL: SHX5780

## 2021-06-28 LAB — HM COLONOSCOPY

## 2021-06-28 SURGERY — COLONOSCOPY WITH PROPOFOL
Anesthesia: General

## 2021-06-28 MED ORDER — SPOT INK MARKER SYRINGE KIT
PACK | SUBMUCOSAL | Status: AC
  Filled 2021-06-28: qty 5

## 2021-06-28 MED ORDER — PROPOFOL 500 MG/50ML IV EMUL
INTRAVENOUS | Status: DC | PRN
  Administered 2021-06-28: 150 ug/kg/min via INTRAVENOUS

## 2021-06-28 MED ORDER — MIDAZOLAM HCL 2 MG/2ML IJ SOLN: 2.0000 mg | Freq: Once | INTRAMUSCULAR | Status: AC

## 2021-06-28 MED ORDER — SPOT INK MARKER SYRINGE KIT
PACK | SUBMUCOSAL | Status: DC | PRN
  Administered 2021-06-28: 2 mL via SUBMUCOSAL

## 2021-06-28 MED ORDER — SPOT INK MARKER SYRINGE KIT
PACK | SUBMUCOSAL | Status: AC
  Filled 2021-06-28: qty 10

## 2021-06-28 MED ORDER — LIDOCAINE HCL (CARDIAC) PF 100 MG/5ML IV SOSY
PREFILLED_SYRINGE | INTRAVENOUS | Status: DC | PRN
  Administered 2021-06-28: 50 mg via INTRAVENOUS

## 2021-06-28 MED ORDER — MIDAZOLAM HCL 2 MG/2ML IJ SOLN
INTRAMUSCULAR | Status: AC
  Administered 2021-06-28: 2 mg via INTRAVENOUS
  Filled 2021-06-28: qty 2

## 2021-06-28 MED ORDER — PHENYLEPHRINE HCL (PRESSORS) 10 MG/ML IV SOLN
INTRAVENOUS | Status: DC | PRN
  Administered 2021-06-28 (×5): 80 ug via INTRAVENOUS

## 2021-06-28 MED ORDER — LACTATED RINGERS IV SOLN: INTRAVENOUS | Status: DC

## 2021-06-28 MED ORDER — SODIUM CHLORIDE FLUSH 0.9 % IV SOLN
INTRAVENOUS | Status: AC
  Filled 2021-06-28: qty 10

## 2021-06-28 MED ORDER — PHENYLEPHRINE 40 MCG/ML (10ML) SYRINGE FOR IV PUSH (FOR BLOOD PRESSURE SUPPORT)
PREFILLED_SYRINGE | INTRAVENOUS | Status: AC
  Filled 2021-06-28: qty 10

## 2021-06-28 MED ORDER — PROPOFOL 10 MG/ML IV BOLUS
INTRAVENOUS | Status: DC | PRN
  Administered 2021-06-28: 100 mg via INTRAVENOUS

## 2021-06-28 NOTE — Discharge Instructions (Signed)
Resume clopidogrel and Eliquis on 07/01/2021 ?Resume other medications as before ?High-fiber diet ?No driving for 24 hours ?Physician will call with biopsy results. ?

## 2021-06-28 NOTE — Op Note (Addendum)
[facility] ?Patient Name: [name] ?Procedure Date: 06/28/2021 10:11 AM ?MRN: [id number] ?Date of Birth: 07-28-1945 ?Attending MD: Hildred Laser , MD ?CSN: [id number] ?Age: 76 ?Admit Type: Outpatient ?Procedure:                Colonoscopy ?Indications:              High risk colon cancer surveillance: Personal  ?                          history of colonic polyps ?Providers:                Hildred Laser, MD, Lurline Del, RN, Wynonia Musty  ?                          Tech, Technician ?Referring MD:             Glenda Chroman, MD ?Medicines:                Propofol per Anesthesia ?Complications:            No immediate complications. ?Estimated Blood Loss:     Estimated blood loss was minimal. ?Procedure:                Pre-Anesthesia Assessment: ?                          - Prior to the procedure, a History and Physical  ?                          was performed, and patient medications and  ?                          allergies were reviewed. The patient's tolerance of  ?                          previous anesthesia was also reviewed. The risks  ?                          and benefits of the procedure and the sedation  ?                          options and risks were discussed with the patient.  ?                          All questions were answered, and informed consent  ?                          was obtained. Prior Anticoagulants: The patient  ?                          last took Eliquis (apixaban) 2 days and Plavix  ?                          (clopidogrel) 5 days prior to the procedure. ASA  ?  Grade Assessment: III - A patient with severe  ?                          systemic disease. After reviewing the risks and  ?                          benefits, the patient was deemed in satisfactory  ?                          condition to undergo the procedure. ?                          After obtaining informed consent, the colonoscope  ?                          was passed under direct  vision. Throughout the  ?                          procedure, the patient's blood pressure, pulse, and  ?                          oxygen saturations were monitored continuously. The  ?                          PCF-HQ190L ([id number]) scope was introduced through  ?                          the anus and advanced to the the cecum, identified  ?                          by appendiceal orifice and ileocecal valve. The  ?                          colonoscopy was performed without difficulty. The  ?                          patient tolerated the procedure well. The quality  ?                          of the bowel preparation was good. The ileocecal  ?                          valve, appendiceal orifice, and rectum were  ?                          photographed. ?Scope In: 10:26:32 AM ?Scope Out: 10:57:29 AM ?Scope Withdrawal Time: 0 hours 24 minutes 57 seconds  ?Total Procedure Duration: 0 hours 30 minutes 57 seconds  ?Findings: ?     Skin tags were found on perianal exam. ?     A small polyp was found in the splenic flexure. Biopsies were taken with  ?     a cold forceps for histology. The pathology specimen was placed into  ?     Bottle Number 1. ?     Four polyps  were found in the rectum, transverse colon and hepatic  ?     flexure. The polyps were small in size. These polyps were removed with a  ?     cold snare. Resection and retrieval were complete. The pathology  ?     specimen was placed into Bottle Number 1. ?     A 12 mm polyp was found in the hepatic flexure. The polyp was  ?     multi-lobulated with ulcerated surface. Area was successfully injected  ?     with 6 mL Eleview for lesion assessment, and this injection appeared to  ?     lift the lesion adequately. The polyp was removed with a hot snare.  ?     Resection and retrieval were complete. The pathology specimen was placed  ?     into Bottle Number 2. Area was tattooed with an injection of 2 mL of  ?     Spot (carbon black). ?     Scattered diverticula were  found in the sigmoid colon. ?     External hemorrhoids were found during retroflexion. The hemorrhoids  ?     were medium-sized. ?Impression:               - Perianal skin tags found on perianal exam. ?                          - One small polyp at the splenic flexure. Biopsied. ?                          - Four small polyps in the rectum, in the  ?                          transverse colon and at the hepatic flexure,  ?                          removed with a cold snare. Resected and retrieved. ?                          - One 12 mm ulcerated polyp at the hepatic flexure,  ?                          removed with a hot snare. Resected and retrieved.  ?                          Injected. Tattooed. ?                          - Diverticulosis in the sigmoid colon. ?                          - Scar noted in mid sigmoid colon from prior  ?                          polypectomy with tattoo proximally. ?                          - External hemorrhoids. ?Moderate Sedation: ?  Per Anesthesia Care ?Recommendation:           - Patient has a contact number available for  ?                          emergencies. The signs and symptoms of potential  ?                          delayed complications were discussed with the  ?                          patient. Return to normal activities tomorrow.  ?                          Written discharge instructions were provided to the  ?                          patient. ?                          - High fiber diet today. ?                          - Continue present medications. ?                          - Resume Eliquis (apixaban) and Plavix  ?                          (clopidogrel) in 3 days at prior doses. ?                          - Await pathology results. ?                          - Repeat colonoscopy is recommended. The  ?                          colonoscopy date will be determined after pathology  ?                          results from today's exam become available for  ?                           review. ?Procedure Code(s):        --- Professional --- ?                          [phone], Colonoscopy, flexible; with removal of  ?                          tumor(s), polyp(s), or other lesion(s) by snare  ?                          technique ?  45380, 59, Colonoscopy, flexible; with biopsy,  ?                          single or multiple ?                          45381, Colonoscopy, flexible; with directed  ?                          submucosal injection(s), any substance ?Diagnosis Code(s):        --- Professional --- ?                          K64.4, Residual hemorrhoidal skin tags ?                          Z86.010, Personal history of colonic polyps ?                          K63.5, Polyp of colon ?                          K62.1, Rectal polyp ?                          K57.30, Diverticulosis of large intestine without  ?                          perforation or abscess without bleeding ?CPT copyright 2019 American Medical Association. All rights reserved. ?The codes documented in this report are preliminary and upon coder review may  ?be revised to meet current compliance requirements. ?Hildred Laser, MD ?Hildred Laser, MD ?06/28/2021 11:13:23 AM ?This report has been signed electronically. ?Number of Addenda: 0 ?

## 2021-06-28 NOTE — Anesthesia Preprocedure Evaluation (Signed)
Anesthesia Evaluation  ?Patient identified by MRN, date of birth, ID band ?Patient awake ? ? ? ?Reviewed: ?Allergy & Precautions, NPO status , Patient's Chart, lab work & pertinent test results, reviewed documented beta blocker date and time  ? ?Airway ?Mallampati: II ? ?TM Distance: >3 FB ?Neck ROM: Full ? ? ? Dental ? ?(+) Edentulous Upper, Edentulous Lower ?  ?Pulmonary ?neg pulmonary ROS,  ?  ?Pulmonary exam normal ?breath sounds clear to auscultation ? ? ? ? ? ? Cardiovascular ?Exercise Tolerance: Good ?hypertension, Pt. on medications and Pt. on home beta blockers ?+ angina + CAD, + Past MI and + Cardiac Stents  ?Normal cardiovascular exam+ dysrhythmias Atrial Fibrillation  ?Rhythm:Regular Rate:Normal ? ? ?  ?Neuro/Psych ? Headaches, PSYCHIATRIC DISORDERS Anxiety   ? GI/Hepatic ?Neg liver ROS, GERD  Medicated and Controlled,  ?Endo/Other  ?negative endocrine ROS ? Renal/GU ?negative Renal ROS  ?negative genitourinary ?  ?Musculoskeletal ?negative musculoskeletal ROS ?(+)  ? Abdominal ?  ?Peds ?negative pediatric ROS ?(+)  Hematology ?negative hematology ROS ?(+)   ?Anesthesia Other Findings ? ? Reproductive/Obstetrics ?negative OB ROS ? ?  ? ? ? ? ? ? ? ? ? ? ? ? ? ?  ?  ? ? ? ? ? ? ? ?Anesthesia Physical ?Anesthesia Plan ? ?ASA: 3 ? ?Anesthesia Plan: General  ? ?Post-op Pain Management: Minimal or no pain anticipated  ? ?Induction: Intravenous ? ?PONV Risk Score and Plan: Propofol infusion ? ?Airway Management Planned: Nasal Cannula and Natural Airway ? ?Additional Equipment:  ? ?Intra-op Plan:  ? ?Post-operative Plan:  ? ?Informed Consent: I have reviewed the patients History and Physical, chart, labs and discussed the procedure including the risks, benefits and alternatives for the proposed anesthesia with the patient or authorized representative who has indicated his/her understanding and acceptance.  ? ? ? ?Dental advisory given ? ?Plan Discussed with: CRNA and  Surgeon ? ?Anesthesia Plan Comments:   ? ? ? ? ? ? ?Anesthesia Quick Evaluation ? ?

## 2021-06-28 NOTE — H&P (Signed)
[name] is an 76 y.o. male.   ?Chief Complaint: Patient is here for colonoscopy ?HPI: Patient is 76 year old Caucasian male who has a history of colonic adenomas and sessile serrated polyps who is here for surveillance colonoscopy.  He has no GI complaints.  He has been off Eliquis for 2 days. ?Family history is negative for colon cancer. ? ?Past Medical History:  ?Diagnosis Date  ? Anxiety   ? Atrial fibrillation ([place])   ? 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 Chest pain   ? myoview 07/09/12-normal, nl ef; echo 04/12/05-ef>55%, mild aortic sclerosis  ? GERD (gastroesophageal reflux disease)   ? H. pylori infection   ? Headache   ? Hypercholesteremia   ? Hyperlipidemia   ? Hypertension   ? Hyponatremia 2013  ? Palpitations   ? event monior 04/11/05- SR with occ PAC  ? Personal history of colonic polyps 2 9 2007  ? ? ?Past Surgical History:  ?Procedure Laterality Date  ? BACK SURGERY    ? CATARACT EXTRACTION W/PHACO Left 12/08/2012  ? Procedure: CATARACT EXTRACTION PHACO AND INTRAOCULAR LENS PLACEMENT (IOC);  Surgeon: Tonny Branch, MD;  Location: [facility];  Service: Ophthalmology;  Laterality: Left;  CDE:  23.19  ? CATARACT EXTRACTION W/PHACO Right 04/15/2014  ? Procedure: CATARACT EXTRACTION PHACO AND INTRAOCULAR LENS PLACEMENT RIGHT;  Surgeon: Tonny Branch, MD;  Location: [facility];  Service: Ophthalmology;  Laterality: Right;  CDE:10.32  ? COLONOSCOPY  12/06/2010  ? Procedure: COLONOSCOPY;  Surgeon: Rogene Houston, MD;  Location: [facility];  Service: Endoscopy;  Laterality: N/A;  ? COLONOSCOPY N/A 05/23/2016  ? Procedure: COLONOSCOPY;  Surgeon: Rogene Houston, MD;  Location: [facility];  Service: Endoscopy;  Laterality: N/A;  930  ? CORONARY STENT INTERVENTION N/A 02/24/2018  ? Procedure: CORONARY STENT INTERVENTION;  Surgeon: Lorretta Harp, MD;  Location: [place] [facility];  Service: Cardiovascular;  Laterality: N/A;  ? ESOPHAGOGASTRODUODENOSCOPY N/A 07/20/2015  ? Procedure: ESOPHAGOGASTRODUODENOSCOPY (EGD);  Surgeon:  Rogene Houston, MD;  [facility];  Service: Endoscopy;  Laterality: N/A;  2:55 - moved to 1:55 - Ann notified pt  ? EYE SURGERY    ? LEFT HEART CATH AND CORONARY ANGIOGRAPHY N/A 02/24/2018  ? Procedure: LEFT HEART CATH AND CORONARY ANGIOGRAPHY;  Surgeon: Lorretta Harp, MD;  Location: [place] [facility];  Service: Cardiovascular;  Laterality: N/A;  ? LUMBAR DISC SURGERY Left 2007  ? "L4-5 ruptured discs"  ? POLYPECTOMY  05/23/2016  ? Procedure: POLYPECTOMY;  Surgeon: Rogene Houston, MD;  [facility];  Service: Endoscopy;;  colon  ? ? ?Family History  ?Problem Relation Age of Onset  ? Hypertension Father   ? Hyperlipidemia Father   ? CAD Father   ?     H/O CABG  ? ?Social History:  reports that he has never smoked. He has never used smokeless tobacco. He reports that he does not drink alcohol and does not use drugs. ? ?Allergies:  ?Allergies  ?Allergen Reactions  ? Lisinopril Swelling  ?  H/o of angioedema ?  ? Chlorthalidone Other (See Comments)  ?  Causes severe hyponatremia  ? Flexeril [Cyclobenzaprine]   ?  Didn't feel right  ? Hyoscyamine Swelling  ? Prevacid [Lansoprazole] Swelling  ? Valium [Diazepam] Other (See Comments)  ?  "makes me feel crazy"  ? Potassium-Containing Compounds Palpitations  ? ? ?Medications Prior to Admission  ?Medication Sig Dispense Refill  ? acetaminophen (TYLENOL)  500 MG tablet Take 250 mg by mouth 2 (two) times daily as needed for moderate pain or headache.    ? ALPRAZolam (XANAX) 0.5 MG tablet Take 0.5 mg by mouth 3 (three) times daily as needed for anxiety.    ? amLODipine (NORVASC) 5 MG tablet Take 1 tablet (5 mg total) by mouth daily. 90 tablet 3  ? apixaban (ELIQUIS) 5 MG TABS tablet Take 1 tablet (5 mg total) by mouth 2 (two) times daily. 60 tablet 6  ? atorvastatin (LIPITOR) 80 MG tablet Take 1 tablet (80 mg total) by mouth at bedtime. 90 tablet 3  ? clopidogrel (PLAVIX) 75 MG tablet Take 1 tablet (75 mg total) by mouth daily. 90 tablet 3  ? diltiazem  (CARTIA XT) 120 MG 24 hr capsule Take 1 capsule (120 mg total) by mouth daily. 90 capsule 3  ? isosorbide mononitrate (IMDUR) 30 MG 24 hr tablet Take 1 tablet (30 mg total) by mouth daily. 90 tablet 3  ? metoprolol tartrate (LOPRESSOR) 25 MG tablet Take 1 tablet (25 mg total) by mouth 2 (two) times daily. 180 tablet 3  ? pantoprazole (PROTONIX) 40 MG tablet Take 1 tablet (40 mg total) by mouth every other day. (Patient taking differently: Take 40 mg by mouth daily as needed (acid reflux).) 45 tablet 3  ? ? ?No results found for this or any previous visit (from the past 48 hour(s)). ?No results found. ? ?Review of Systems ? ?Blood pressure 130/69, pulse 68, temperature 98 ?F (36.7 ?C), temperature source Oral, resp. rate 13, SpO2 99 %. ?Physical Exam ?HENT:  ?   Mouth/Throat:  ?   Mouth: Mucous membranes are moist.  ?   Pharynx: Oropharynx is clear.  ?Eyes:  ?   General: No scleral icterus. ?   Conjunctiva/sclera: Conjunctivae normal.  ?Cardiovascular:  ?   Rate and Rhythm: Normal rate and regular rhythm.  ?   Heart sounds: Normal heart sounds. No murmur heard. ?Pulmonary:  ?   Effort: Pulmonary effort is normal.  ?   Breath sounds: Normal breath sounds.  ?Abdominal:  ?   General: There is no distension.  ?   Palpations: Abdomen is soft. There is no mass.  ?   Tenderness: There is no abdominal tenderness.  ?Musculoskeletal:     ?   General: No swelling.  ?   Cervical back: Neck supple.  ?Lymphadenopathy:  ?   Cervical: No cervical adenopathy.  ?Skin: ?   General: Skin is warm and dry.  ?Neurological:  ?   Mental Status: He is alert.  ?  ? ?Assessment/Plan ? ?History of colonic adenomas ?Surveillance colonoscopy. ? ?Hildred Laser, MD ?06/28/2021, 10:22 AM ? ? ? ?

## 2021-06-28 NOTE — Transfer of Care (Signed)
Immediate Anesthesia Transfer of Care Note ? ?Patient: [name] ? ?Procedure(s) Performed: COLONOSCOPY WITH PROPOFOL ?POLYPECTOMY ? ?Patient Location: Short Stay ? ?Anesthesia Type:General ? ?Level of Consciousness: drowsy ? ?Airway & Oxygen Therapy: Patient Spontanous Breathing ? ?Post-op Assessment: Report given to RN and Post -op Vital signs reviewed and stable ? ?Post vital signs: Reviewed and stable ? ?Last Vitals:  ?Vitals Value Taken Time  ?BP 99/55 06/28/21 1104  ?Temp 36.5 ?C 06/28/21 1104  ?Pulse 62 06/28/21 1104  ?Resp 13 06/28/21 1104  ?SpO2 96 % 06/28/21 1104  ? ? ?Last Pain:  ?Vitals:  ? 06/28/21 1104  ?TempSrc: Axillary  ?PainSc: Asleep  ?   ? ?Patients Stated Pain Goal: 5 (06/28/21 4961) ? ?Complications: No notable events documented. ?

## 2021-06-28 NOTE — Anesthesia Postprocedure Evaluation (Signed)
Anesthesia Post Note ? ?Patient: [name] ? ?Procedure(s) Performed: COLONOSCOPY WITH PROPOFOL ?POLYPECTOMY ? ?Patient location during evaluation: Endoscopy ?Anesthesia Type: General ?Level of consciousness: awake and alert and lethargic ?Pain management: pain level controlled ?Vital Signs Assessment: post-procedure vital signs reviewed and stable ?Respiratory status: spontaneous breathing, nonlabored ventilation and respiratory function stable ?Cardiovascular status: blood pressure returned to baseline and stable ?Postop Assessment: no apparent nausea or vomiting ?Anesthetic complications: no ? ? ?No notable events documented. ? ? ?Last Vitals:  ?Vitals:  ? 06/28/21 1102 06/28/21 1104  ?BP: (!) 88/39 (!) 99/55  ?Pulse:  62  ?Resp:  13  ?Temp:  36.5 ?C  ?SpO2:  96%  ?  ?Last Pain:  ?Vitals:  ? 06/28/21 1111  ?TempSrc:   ?PainSc: 0-No pain  ? ? ?  ?  ?  ?  ?  ?  ? ?[name] C Auset Fritzler ? ? ? ? ?

## 2021-06-29 LAB — SURGICAL PATHOLOGY

## 2021-06-30 ENCOUNTER — Encounter (HOSPITAL_COMMUNITY): Payer: Self-pay | Admitting: Internal Medicine

## 2021-07-12 ENCOUNTER — Other Ambulatory Visit: Payer: Self-pay | Admitting: Cardiovascular Disease

## 2021-08-08 ENCOUNTER — Ambulatory Visit: Payer: Medicare Other | Admitting: Cardiovascular Disease

## 2021-08-08 ENCOUNTER — Encounter: Payer: Self-pay | Admitting: Cardiovascular Disease

## 2021-08-08 DIAGNOSIS — I2542 Coronary artery dissection: Secondary | ICD-10-CM | POA: Diagnosis not present

## 2021-08-08 DIAGNOSIS — I10 Essential (primary) hypertension: Secondary | ICD-10-CM

## 2021-08-08 DIAGNOSIS — T81718D Complication of other artery following a procedure, not elsewhere classified, subsequent encounter: Secondary | ICD-10-CM | POA: Diagnosis not present

## 2021-08-08 DIAGNOSIS — E785 Hyperlipidemia, unspecified: Secondary | ICD-10-CM

## 2021-08-08 NOTE — Assessment & Plan Note (Signed)
History of PAF maintaining sinus rhythm on Eliquis oral anticoagulation. 

## 2021-08-08 NOTE — Assessment & Plan Note (Signed)
History of dyslipidemia on statin therapy with lipid profile performed by his PCP 04/24/2021 revealing total cholesterol 52, LDL of 83 and HDL 47.

## 2021-08-08 NOTE — Assessment & Plan Note (Signed)
History of essential hypertension a blood pressure measured today at 146/70.  He is on amlodipine, metoprolol and diltiazem.

## 2021-08-08 NOTE — Progress Notes (Signed)
08/08/2021 [name]   14-Oct-1945  [id number]  Primary Physician Glenda Chroman, MD Primary Cardiologist: Lorretta Harp MD Garret Reddish, [place], [name]  HPI:  [name] is a 76 y.o.  well-appearing Caucasian male who I last  saw him in the office 07/08/2020. He has a history of hypertension and hyperlipidemia. He had atypical chest pain here tone with a negative Myoview 07/09/12. His pain improved with proton pump inhibition. Because of recurrent chest pain he saw [name], PA-C in the office who did obtain a 2-D echo and Myoview perfusion study all normal. His pain has improved again on a PPI suggesting that it was related to GERD. Since I saw Mr. [name] in the office a year ago he has been well until this past Christmas when he was admitted to [facility] with A. fib with RVR. He converted to normal sinus rhythm with IV diltiazem and was converted to by mouth Cardizem. His troponins were negative and 2-D echo was normal. Because of his elevated CHA2DsVASC2 score of 2 he was begun on Eliquis oral anticoagulation. He saw Roderic Palau in the A. fib clinic 04/19/16 because of palpitations. He says he occasionally feels his heart skip or missed AB. She also had a more prolonged episode of PAF lasting 20 seconds. She gave him when necessary diltiazem 30 mg a day for persistently elevated heart rate. He was just seen in the emergency room 07/07/16 by Dr. Ellender Hose. He was complaining of palpitations and was noted to have PVCs.   He was admitted to the hospital and had cardiac cath by myself 02/24/2018 revealing a high-grade mid RCA stenosis.  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  His EF was preserved without inferior wall motion abnormality.  He was on "triple therapy" for 1 month after which aspirin was discontinued.  He remains on Plavix and Eliquis.     He did see [name], NP in the office 12/11/2018  who added a PPI and gave him some Xanax.  He was seen in the ER 05/16/2019 with epigastric pain.  His EKG showed no acute changes.  His troponins were negative x2.  He does get occasional episodes of epigastric pain on Protonix.     Since I saw him 6 months ago he continues to do well.  He did have a negative Myoview stress test 05/29/2019.  He currently denies chest pain or shortness of breath.  He had colonoscopy by Dr. Melony Overly at which time he had 4 polyps removed that were benign.  Current Meds  Medication Sig   acetaminophen (TYLENOL) 500 MG tablet Take 250 mg by mouth 2 (two) times daily as needed for moderate pain or headache.   ALPRAZolam (XANAX) 0.5 MG tablet Take 0.5 mg by mouth 3 (three) times daily as needed for anxiety.   amLODipine (NORVASC) 5 MG tablet Take 1 tablet (5 mg total) by mouth daily.   apixaban (ELIQUIS) 5 MG TABS tablet Take 1 tablet (5 mg total) by mouth 2 (two) times daily.   atorvastatin (LIPITOR) 80 MG tablet Take 1 tablet (80 mg total) by mouth at bedtime.   clopidogrel (PLAVIX) 75 MG tablet TAKE 1 TABLET BY MOUTH ONCE DAILY.   diltiazem (CARTIA XT) 120 MG 24 hr capsule Take 1 capsule (120 mg total) by mouth daily.   isosorbide mononitrate (IMDUR) 30 MG 24 hr tablet Take 1 tablet (30 mg total) by  mouth daily.   metoprolol tartrate (LOPRESSOR) 25 MG tablet Take 1 tablet (25 mg total) by mouth 2 (two) times daily.   pantoprazole (PROTONIX) 40 MG tablet Take 1 tablet (40 mg total) by mouth every other day. (Patient taking differently: Take 40 mg by mouth daily as needed (acid reflux).)     Allergies  Allergen Reactions   Lisinopril Swelling    H/o of angioedema    Chlorthalidone Other (See Comments)    Causes severe hyponatremia   Flexeril [Cyclobenzaprine]     Didn't feel right   Hyoscyamine Swelling   Prevacid [Lansoprazole] Swelling   Valium [Diazepam] Other (See Comments)    "makes me feel crazy"   Potassium-Containing Compounds Palpitations    Social  History   Socioeconomic History   Marital status: Married    Spouse name: Not on file   Number of children: 1   Years of education: Not on file   Highest education level: Not on file  Occupational History   Not on file  Tobacco Use   Smoking status: Never   Smokeless tobacco: Never  Substance and Sexual Activity   Alcohol use: No   Drug use: No   Sexual activity: Not on file  Other Topics Concern   Not on file  Social History Narrative   ** Merged History Encounter **       Social Determinants of Health   Financial Resource Strain: Not on file  Food Insecurity: Not on file  Transportation Needs: Not on file  Physical Activity: Not on file  Stress: Not on file  Social Connections: Not on file  Intimate Partner Violence: Not on file     Review of Systems: General: negative for chills, fever, night sweats or weight changes.  Cardiovascular: negative for chest pain, dyspnea on exertion, edema, orthopnea, palpitations, paroxysmal nocturnal dyspnea or shortness of breath Dermatological: negative for rash Respiratory: negative for cough or wheezing Urologic: negative for hematuria Abdominal: negative for nausea, vomiting, diarrhea, bright red blood per rectum, melena, or hematemesis Neurologic: negative for visual changes, syncope, or dizziness All other systems reviewed and are otherwise negative except as noted above.    Blood pressure (!) 146/70, pulse 60, height '5\' 8"'$  (1.727 m), weight 148 lb 6.4 oz (67.3 kg), SpO2 98 %.  General appearance: alert and no distress Neck: no adenopathy, no carotid bruit, no JVD, supple, symmetrical, trachea midline, and thyroid not enlarged, symmetric, no tenderness/mass/nodules Lungs: clear to auscultation bilaterally Heart: regular rate and rhythm, S1, S2 normal, no murmur, click, rub or gallop Extremities: extremities normal, atraumatic, no cyanosis or edema Pulses: 2+ and symmetric Skin: Skin color, texture, turgor normal. No rashes  or lesions Neurologic: Grossly normal  EKG sinus rhythm at 60 with incomplete right bundle branch block.  I personally reviewed this EKG.  ASSESSMENT AND PLAN:   Dyslipidemia History of dyslipidemia on statin therapy with lipid profile performed by his PCP 04/24/2021 revealing total cholesterol 52, LDL of 83 and HDL 47.  Essential hypertension History of essential hypertension a blood pressure measured today at 146/70.  He is on amlodipine, metoprolol and diltiazem.  PAF (paroxysmal atrial fibrillation) (HCC) History of PAF maintaining sinus rhythm on Eliquis oral anticoagulation.  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  Unfortunately he had significant dissection with balloon Preet predilatation requiring 4 overlapping stents.  He remains on clopidogrel.  He is completely asymptomatic.  Lorretta Harp MD FACP,FACC,FAHA, [facility] 08/08/2021 9:20 AM

## 2021-08-08 NOTE — Patient Instructions (Signed)

## 2021-08-08 NOTE — Assessment & Plan Note (Signed)
History of CAD status post RCA intervention by myself 02/24/2018.  Unfortunately he had significant dissection with balloon Preet predilatation requiring 4 overlapping stents.  He remains on clopidogrel.  He is completely asymptomatic.

## 2021-09-01 ENCOUNTER — Other Ambulatory Visit: Payer: Self-pay | Admitting: Cardiovascular Disease

## 2021-09-11 DIAGNOSIS — S62522A Displaced fracture of distal phalanx of left thumb, initial encounter for closed fracture: Secondary | ICD-10-CM | POA: Diagnosis not present

## 2021-09-11 DIAGNOSIS — Z789 Other specified health status: Secondary | ICD-10-CM | POA: Diagnosis not present

## 2021-09-11 DIAGNOSIS — M25532 Pain in left wrist: Secondary | ICD-10-CM | POA: Diagnosis not present

## 2021-09-11 DIAGNOSIS — Z299 Encounter for prophylactic measures, unspecified: Secondary | ICD-10-CM | POA: Diagnosis not present

## 2021-09-11 DIAGNOSIS — S6992XA Unspecified injury of left wrist, hand and finger(s), initial encounter: Secondary | ICD-10-CM | POA: Diagnosis not present

## 2021-09-11 DIAGNOSIS — I10 Essential (primary) hypertension: Secondary | ICD-10-CM | POA: Diagnosis not present

## 2021-09-13 ENCOUNTER — Encounter: Payer: Self-pay | Admitting: Orthopaedic Surgery

## 2021-09-13 ENCOUNTER — Ambulatory Visit (INDEPENDENT_AMBULATORY_CARE_PROVIDER_SITE_OTHER): Payer: Medicare Other | Admitting: Orthopaedic Surgery

## 2021-09-13 DIAGNOSIS — S62522A Displaced fracture of distal phalanx of left thumb, initial encounter for closed fracture: Secondary | ICD-10-CM | POA: Insufficient documentation

## 2021-09-13 DIAGNOSIS — S62525B Nondisplaced fracture of distal phalanx of left thumb, initial encounter for open fracture: Secondary | ICD-10-CM

## 2021-09-13 NOTE — Progress Notes (Signed)
Office Visit Note   Patient: [name]           Date of Birth: 06/26/1945           MRN: [id number] Visit Date: 09/13/2021              Requested by: Glenda Chroman, MD [place],  [place] [id number] PCP: Glenda Chroman, MD   Assessment & Plan: Visit Diagnoses:  1. Open nondisplaced fracture of distal phalanx of left thumb, initial encounter     Plan: Mr. Hudgins fell 2 days ago breaking his fall with an outstretched left upper extremity.  He had immediate onset of thumb pain and was seen by his primary care physician, Dr. Woody Seller.  He was placed on amoxicillin and asked to follow-up in the office notes the swelling is significantly reduced.  He did not had any fever chills or any present drainage.  He is on Eliquis with a history of atrial fibrillation and cardiac stents.  Did review his x-rays.  He has a nondisplaced transverse fracture at the metaphyseal portion of the distal phalanx of the left thumb.  We will apply an aluminum splint, finish the course of antibiotics and return to the office for 1 further evaluation in 2 weeks.  Should not need any further x-rays.  Follow-Up Instructions: Return in about 2 weeks (around 09/27/2021).   Orders:  No orders of the defined types were placed in this encounter.  No orders of the defined types were placed in this encounter.     Procedures: No procedures performed   Clinical Data: No additional findings.   Subjective: Chief Complaint  Patient presents with   Left Hand - Injury, Pain, Open Wound    Fell Monday, 09/11/21 on concrete steps and hurt my thumb. It is bruised and swollen some. Sore around my nail. My PCP sent me to [facility] to get x-rays done.  Placed on antibiotics by his primary care physician-amoxicillin.  Notes the swelling has significantly decreased.  No loss of sensation.  Has a history of atrial fibrillation and cardiac stents and is on Eliquis.  Initially had some bleeding at the base of the nail but no drainage in  the past 24 hours  HPI  Review of Systems   Objective: Vital Signs: Ht '5\' 8"'$  (1.727 m)   Wt 147 lb 6 oz (66.8 kg)   BMI 22.41 kg/m   Physical Exam Constitutional:      Appearance: He is well-developed.  Eyes:     Pupils: Pupils are equal, round, and reactive to light.  Pulmonary:     Effort: Pulmonary effort is normal.  Skin:    General: Skin is warm and dry.  Neurological:     Mental Status: He is alert and oriented to person, place, and time.  Psychiatric:        Behavior: Behavior normal.     Ortho Exam awake alert and oriented x3.  Comfortable sitting left thumb with mild diffuse swelling from the MP joint to the tip of the finger.  There is some crusted blood at the base of the nail.  No deformity.  Good sensation and capillary refill.  Very minimal tenderness at the metacarpal phalangeal joint.  Most tenderness at the base of the nail.  No subungual hematoma  Specialty Comments:  No specialty comments available.  Imaging: No results found.   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    CAD (coronary artery disease) 02/24/2018   Family history of coronary artery disease in father 02/19/2018   Chronic anticoagulation 02/18/2018   PAF (paroxysmal atrial fibrillation) ([place]) 03/13/2016   History of colonic polyps 12/15/2015   Palpitations 02/25/2015   Lumbago 01/01/2013   Unstable angina ([place]) 08/08/2012   Essential hypertension 08/08/2012   Diarrhea 10/23/2010   Epigastric pain 10/23/2010   Anxiety    Dyslipidemia    GERD (gastroesophageal reflux disease)    COLONIC POLYPS 11/23/2008   IBS 11/23/2008   LOSS OF APPETITE 11/23/2008   EPIGASTRIC PAIN 11/23/2008   HEARTBURN, HX OF 11/23/2008   Past Medical History:   Diagnosis Date   Anxiety    Atrial fibrillation (HCC)    Cancer (HCC)    testicular    Chest pain    myoview 07/09/12-normal, nl ef; echo 04/12/05-ef>55%, mild aortic sclerosis   GERD (gastroesophageal reflux disease)    H. pylori infection    Headache    Hypercholesteremia    Hyperlipidemia    Hypertension    Hyponatremia 2013   Palpitations    event monior 04/11/05- SR with occ PAC   Personal history of colonic polyps 2 9 2007    Family History  Problem Relation Age of Onset   Hypertension Father    Hyperlipidemia Father    CAD Father        H/O CABG    Past Surgical History:  Procedure Laterality Date   BACK SURGERY     CATARACT EXTRACTION W/PHACO Left 12/08/2012   Procedure: CATARACT EXTRACTION PHACO AND INTRAOCULAR LENS PLACEMENT ([place]);  Surgeon: Tonny Branch, MD;  Location: [facility];  Service: Ophthalmology;  Laterality: Left;  CDE:  23.19   CATARACT EXTRACTION W/PHACO Right 04/15/2014   Procedure: CATARACT EXTRACTION PHACO AND INTRAOCULAR LENS PLACEMENT RIGHT;  Surgeon: Tonny Branch, MD;  Location: [facility];  Service: Ophthalmology;  Laterality: Right;  CDE:10.32   COLONOSCOPY  12/06/2010   Procedure: COLONOSCOPY;  Surgeon: Rogene Houston, MD;  [facility];  Service: Endoscopy;  Laterality: N/A;   COLONOSCOPY N/A 05/23/2016   Procedure: COLONOSCOPY;  Surgeon: Rogene Houston, MD;  Location[facility];  Service: Endoscopy;  Laterality: N/A;  930   COLONOSCOPY WITH PROPOFOL N/A 06/28/2021   Procedure: COLONOSCOPY WITH PROPOFOL;  Surgeon: Rogene Houston, MD;  [facility];  Service: Endoscopy;  Laterality: N/A;  1045   CORONARY STENT INTERVENTION N/A 02/24/2018   Procedure: CORONARY STENT INTERVENTION;  Surgeon: Lorretta Harp, MD;  Location: [place] [facility];  Service: Cardiovascular;  Laterality: N/A;   ESOPHAGOGASTRODUODENOSCOPY N/A 07/20/2015   Procedure: ESOPHAGOGASTRODUODENOSCOPY (EGD);  Surgeon: Rogene Houston, MD;  [facility];   Service: Endoscopy;  Laterality: N/A;  2:55 - moved to 1:55 - Ann notified pt   EYE SURGERY     LEFT HEART CATH AND CORONARY ANGIOGRAPHY N/A 02/24/2018   Procedure: LEFT HEART CATH AND CORONARY ANGIOGRAPHY;  Surgeon: Lorretta Harp, MD;  Location: [place] [facility];  Service: Cardiovascular;  Laterality: N/A;   LUMBAR DISC SURGERY Left 2007   "L4-5 ruptured discs"   POLYPECTOMY  05/23/2016   Procedure: POLYPECTOMY;  Surgeon: Rogene Houston, MD;  [facility];  Service: Endoscopy;;  colon   POLYPECTOMY  06/28/2021   Procedure: POLYPECTOMY;  Surgeon: Rogene Houston, MD;  [facility];  Service: Endoscopy;;  splenic flexure;hepatic flexurex2; cecal polyp;transverse colon polyp;rectal polyp;   Social History   Occupational History   Not on file  Tobacco Use   Smoking status: Never   Smokeless tobacco: Never  Substance and Sexual Activity   Alcohol use: No   Drug use: No   Sexual activity: Not on file     Garald Balding, MD   Note - This record has been created using [employer].  Chart creation errors have been sought, but may not always  have been located. Such creation errors do not reflect on  the standard of medical care.

## 2021-09-15 DIAGNOSIS — I10 Essential (primary) hypertension: Secondary | ICD-10-CM | POA: Diagnosis not present

## 2021-09-15 DIAGNOSIS — R7309 Other abnormal glucose: Secondary | ICD-10-CM | POA: Diagnosis not present

## 2021-09-15 DIAGNOSIS — Z789 Other specified health status: Secondary | ICD-10-CM | POA: Diagnosis not present

## 2021-09-15 DIAGNOSIS — I700 Atherosclerosis of aorta: Secondary | ICD-10-CM | POA: Diagnosis not present

## 2021-10-09 ENCOUNTER — Other Ambulatory Visit: Payer: Self-pay

## 2021-10-09 ENCOUNTER — Emergency Department (HOSPITAL_COMMUNITY)
Admission: EM | Admit: 2021-10-09 | Discharge: 2021-10-10 | Disposition: A | Discharge status: Home / Self Care | Payer: Medicare Other | Attending: Emergency Medicine | Admitting: Emergency Medicine

## 2021-10-09 ENCOUNTER — Emergency Department (HOSPITAL_COMMUNITY): Payer: Medicare Other

## 2021-10-09 DIAGNOSIS — I2510 Atherosclerotic heart disease of native coronary artery without angina pectoris: Secondary | ICD-10-CM | POA: Insufficient documentation

## 2021-10-09 DIAGNOSIS — Z7901 Long term (current) use of anticoagulants: Secondary | ICD-10-CM | POA: Diagnosis not present

## 2021-10-09 DIAGNOSIS — R0789 Other chest pain: Secondary | ICD-10-CM | POA: Diagnosis not present

## 2021-10-09 DIAGNOSIS — R1013 Epigastric pain: Secondary | ICD-10-CM | POA: Diagnosis not present

## 2021-10-09 DIAGNOSIS — I4891 Unspecified atrial fibrillation: Secondary | ICD-10-CM | POA: Diagnosis not present

## 2021-10-09 DIAGNOSIS — S2231XA Fracture of one rib, right side, initial encounter for closed fracture: Secondary | ICD-10-CM | POA: Diagnosis not present

## 2021-10-09 DIAGNOSIS — R079 Chest pain, unspecified: Secondary | ICD-10-CM | POA: Diagnosis not present

## 2021-10-09 LAB — BASIC METABOLIC PANEL
Anion gap: 9 (ref 5–15)
BUN: 10 mg/dL (ref 8–23)
CO2: 23 mmol/L (ref 22–32)
Calcium: 8.9 mg/dL (ref 8.9–10.3)
Chloride: 105 mmol/L (ref 98–111)
Creatinine, Ser: 0.94 mg/dL (ref 0.61–1.24)
GFR, Estimated: >60 mL/min (ref >60)
Glucose, Bld: 129 mg/dL — ABNORMAL HIGH (ref 70–99)
Potassium: 3.8 mmol/L (ref 3.5–5.1)
Sodium: 137 mmol/L (ref 135–145)

## 2021-10-09 LAB — CBC
HCT: 38.6 % — ABNORMAL LOW (ref 39.0–52.0)
Hemoglobin: 12.7 g/dL — ABNORMAL LOW (ref 13.0–17.0)
MCH: 29.9 pg (ref 26.0–34.0)
MCHC: 32.9 g/dL (ref 30.0–36.0)
MCV: 90.8 fL (ref 80.0–100.0)
Platelets: 229 10*3/uL (ref 150–400)
RBC: 4.25 MIL/uL (ref 4.22–5.81)
RDW: 13.1 % (ref 11.5–15.5)
WBC: 8.7 10*3/uL (ref 4.0–10.5)
nRBC: 0 % (ref 0.0–0.2)

## 2021-10-09 LAB — TROPONIN I (HIGH SENSITIVITY): Troponin I (High Sensitivity): 6 ng/L (ref <18)

## 2021-10-09 NOTE — ED Triage Notes (Signed)
Pt here for diffuse chest pain since Friday. Pt has hx of stents placed 4 years ago, recently started back on protonix w/o relief. Pt reports 1 episode of diaphoresis on Friday but denies shob, N/V. States pain stays in his chest.

## 2021-10-09 NOTE — ED Provider Triage Note (Signed)
Emergency Medicine Provider Triage Evaluation Note  [name] , a 76 y.o. male  was evaluated in triage.  Pt complains of chest pain.  History of A-fib, paroxysmal on Eliquis.  Patient has had pain across the chest for the past 3 days.  He has been taking Protonix and antianxiety medications without relief of symptoms.  He did not take nitroglycerin.  He had "a little sweat" 3 days ago.  He denies nausea vomiting shortness of breath, history of stents 4 years ago.  Review of Systems  Positive: Chest pain Negative: Shortness of breath  Physical Exam  BP (!) 160/78 (BP Location: Right Arm)   Pulse 63   Temp 97.8 F (36.6 C) (Oral)   Resp 18   SpO2 99%  Gen:   Awake, no distress   Resp:  Normal effort  MSK:   Moves extremities without difficulty  Other:  Anxious, regular rate and rhythm  Medical Decision Making  Medically screening exam initiated at 3:53 PM.  Appropriate orders placed.  [name] was informed that the remainder of the evaluation will be completed by another provider, this initial triage assessment does not replace that evaluation, and the importance of remaining in the ED until their evaluation is complete.  Work-up initiated   Margarita Mail, PA-C 10/09/21 1556

## 2021-10-10 LAB — TROPONIN I (HIGH SENSITIVITY)
Troponin I (High Sensitivity): 7 ng/L (ref <18)
Troponin I (High Sensitivity): 7 ng/L (ref <18)

## 2021-10-10 NOTE — ED Provider Notes (Signed)
[facility] Provider Note   CSN: [id number] Arrival date & time: 10/09/21  1529     History  Chief Complaint  Patient presents with   Chest Pain    [name] is a 76 y.o. male.  The history is provided by the patient and medical records. No language interpreter was used.  Chest Pain   76 year old male significant history of CAD status post drug eluding stents, anxiety, A-fib currently on Eliquis, GERD, presenting complaint of chest pain.  Patient report 3 days ago he developed pain to his epigastric region in which he described more as a indigestion discomfort sensation that lasted for several hours.  Pain is waxing waning sometimes radiates towards his mid chest and then back to his epigastric region.  Pain is not associated with lightheadedness, dizziness, nausea, shortness of breath, or diaphoresis.  He did take Protonix initially without relief.  However, throughout the day today while waiting in the ER he reported his pain is mostly subsided.  Patient denies fever chills productive cough.  No nausea vomiting or diarrhea.  Does have history of cardiac disease.  Home Medications Prior to Admission medications   Medication Sig Start Date End Date Taking? Authorizing Provider  acetaminophen (TYLENOL) 500 MG tablet Take 250 mg by mouth 2 (two) times daily as needed for moderate pain or headache.    [provider]  ALPRAZolam Duanne Moron) 0.5 MG tablet Take 0.5 mg by mouth 3 (three) times daily as needed for anxiety.    [provider]  amLODipine (NORVASC) 5 MG tablet Take 1 tablet (5 mg total) by mouth daily. 05/09/21   Lorretta Harp, MD  apixaban (ELIQUIS) 5 MG TABS tablet Take 1 tablet (5 mg total) by mouth 2 (two) times daily. 07/01/21   Rogene Houston, MD  atorvastatin (LIPITOR) 80 MG tablet TAKE 1 TABLET BY MOUTH AT BEDTIME. 09/04/21   Lorretta Harp, MD  clopidogrel (PLAVIX) 75 MG tablet TAKE 1 TABLET BY MOUTH ONCE  DAILY. 07/13/21   Lorretta Harp, MD  diltiazem (CARTIA XT) 120 MG 24 hr capsule Take 1 capsule (120 mg total) by mouth daily. 01/16/21   Lorretta Harp, MD  isosorbide mononitrate (IMDUR) 30 MG 24 hr tablet Take 1 tablet (30 mg total) by mouth daily. 08/23/20   Lorretta Harp, MD  metoprolol tartrate (LOPRESSOR) 25 MG tablet Take 1 tablet (25 mg total) by mouth 2 (two) times daily. 08/23/20   Lorretta Harp, MD  pantoprazole (PROTONIX) 40 MG tablet Take 1 tablet (40 mg total) by mouth every other day. Patient taking differently: Take 40 mg by mouth daily as needed (acid reflux). 05/12/19   Rogene Houston, MD      Allergies    Lisinopril, Chlorthalidone, Flexeril [cyclobenzaprine], Hyoscyamine, Prevacid [lansoprazole], Valium [diazepam], and Potassium-containing compounds    Review of Systems   Review of Systems  Cardiovascular:  Positive for chest pain.  All other systems reviewed and are negative.   Physical Exam Updated Vital Signs BP (!) 154/76 (BP Location: Right Arm)   Pulse 65   Temp 97.7 F (36.5 C) (Oral)   Resp 18   SpO2 97%  Physical Exam Vitals and nursing note reviewed.  Constitutional:      General: He is not in acute distress.    Appearance: He is well-developed.  HENT:     Head: Atraumatic.  Eyes:     Conjunctiva/sclera: Conjunctivae normal.  Cardiovascular:  Rate and Rhythm: Normal rate and regular rhythm.     Pulses: Normal pulses.     Heart sounds: Normal heart sounds.  Pulmonary:     Effort: Pulmonary effort is normal.     Breath sounds: Normal breath sounds.  Abdominal:     Palpations: Abdomen is soft.  Musculoskeletal:     Cervical back: Neck supple.  Skin:    Findings: No rash.  Neurological:     Mental Status: He is alert.     ED Results / Procedures / Treatments   Labs (all labs ordered are listed, but only abnormal results are displayed) Labs Reviewed  BASIC METABOLIC PANEL - Abnormal; Notable for the following components:       Result Value   Glucose, Bld 129 (*)    All other components within normal limits  CBC - Abnormal; Notable for the following components:   Hemoglobin 12.7 (*)    HCT 38.6 (*)    All other components within normal limits  TROPONIN I (HIGH SENSITIVITY)  TROPONIN I (HIGH SENSITIVITY)  TROPONIN I (HIGH SENSITIVITY)  TROPONIN I (HIGH SENSITIVITY)    EKG EKG Interpretation  Date/Time:  Monday October 09 2021 15:50:48 EDT Ventricular Rate:  68 PR Interval:  196 QRS Duration: 118 QT Interval:  456 QTC Calculation: 484 R Axis:   24 Text Interpretation: Sinus rhythm with Premature atrial complexes Right bundle branch block Abnormal ECG When compared with ECG of 16-May-2019 13:39, Premature atrial complexes are now present Premature ventricular complexes are no longer present Confirmed by Delora Fuel ([id number]) on 10/09/2021 11:12:43 PM  Radiology DG Chest 2 View  Result Date: 10/09/2021 CLINICAL DATA:  Chest pain. EXAM: CHEST - 2 VIEW COMPARISON:  Chest x-ray September 14, 2019. FINDINGS: The heart size and mediastinal contours are within normal limits. Both lungs are clear. No visible pleural effusions or pneumothorax. No acute osseous abnormality. Remote right rib fracture. Coronary artery atherosclerosis. IMPRESSION: No active cardiopulmonary disease. Electronically Signed   By: Margaretha Sheffield M.D.   On: 10/09/2021 16:36    Procedures Procedures    Medications Ordered in ED Medications - No data to display  ED Course/ Medical Decision Making/ A&P                           Medical Decision Making  BP (!) 154/76 (BP Location: Right Arm)   Pulse 65   Temp 97.7 F (36.5 C) (Oral)   Resp 18   SpO2 97%   8:30 AM This is a 76 year old male significant history of CAD status post drug-eluting stents, A-fib on Eliquis, and history of GERD who presents with epigastric abdominal discomfort which radiates to his chest ongoing for the past several days.  His pain is atypical of ACS.  He did  try taking Protonix for 2 days but report symptoms still present prompting the ER visit.  He has been in the ER waiting room for the past 16 hours.  While waiting he reported his symptom has subsided and currently not having any active chest pain.  He does not endorse any symptoms to suggest infectious etiology.  No shortness of breath to suggest PE.  He describes discomfort as "the sensation I feel after eating a tomato having trouble burping afterward".  Symptoms suggestive of GI source more so than cardiac source.  Labs EKG and imaging independently viewed interpreted by me and I agree with radiologist interpretation.  His electrolyte panels are reassuring,  normal WBC, normal H&H, initial troponin is normal, chest x-ray without acute cardiopulmonary disease and EKG showing sinus rhythm with premature atrial complex along with a right bundle branch block but unchanged from prior.  Awaits delta troponin.  9:40 AM Fortunately normal delta trop.  After discussion with attending Dr. Alvino Chapel, we felt pt can be discharge with outpt f/u.  Return precaution given.  This patient presents to the ED for concern of chest pain, this involves an extensive number of treatment options, and is a complaint that carries with it a high risk of complications and morbidity.  The differential diagnosis includes ACS, GERD, gastritis, PNA, PTX, costochondritis, dissection, PE  Co morbidities that complicate the patient evaluation CAD  GERD  Afib on anticoagulant Additional history obtained:  Additional history obtained from wife External records from outside source obtained and reviewed including EMR including prior labs and imaging  Lab Tests:  I Ordered, and personally interpreted labs.  The pertinent results include:  as above  Imaging Studies ordered:  I ordered imaging studies including CXR I independently visualized and interpreted imaging which showed no acute finding I agree with the radiologist  interpretation  Cardiac Monitoring:  The patient was maintained on a cardiac monitor.  I personally viewed and interpreted the cardiac monitored which showed an underlying rhythm of: NSR  Medicines ordered and prescription drug management:    Test Considered: as above  Critical Interventions: as above  Consultations Obtained:  I requested consultation with the attending Dr. Alvino Chapel,  and discussed lab and imaging findings as well as pertinent plan - they recommend: outpt f/u   Problem List / ED Course: chest pain  Reevaluation:  After the interventions noted above, I reevaluated the patient and found that they have :resolved  Social Determinants of Health: stress  Dispostion:  After consideration of the diagnostic results and the patients response to treatment, I feel that the patent would benefit from outpt f/u.         Final Clinical Impression(s) / ED Diagnoses Final diagnoses:  Atypical chest pain    Rx / DC Orders ED Discharge Orders     None         Domenic Moras, PA-C 10/10/21 1008    Davonna Belling, MD 10/11/21 1601

## 2021-10-10 NOTE — Discharge Instructions (Signed)
You have been evaluated for your chest pain.  This is likely due to heartburn.  Please continue to take Protonix however please call and follow-up closely with your cardiologist for outpatient evaluation of your condition.  You may benefit from a cardiac stress test.  Return if your symptoms worsen or if you have any other concern.

## 2021-10-13 DIAGNOSIS — I10 Essential (primary) hypertension: Secondary | ICD-10-CM | POA: Diagnosis not present

## 2021-10-13 DIAGNOSIS — R079 Chest pain, unspecified: Secondary | ICD-10-CM | POA: Diagnosis not present

## 2021-10-13 DIAGNOSIS — K219 Gastro-esophageal reflux disease without esophagitis: Secondary | ICD-10-CM | POA: Diagnosis not present

## 2021-10-13 DIAGNOSIS — Z299 Encounter for prophylactic measures, unspecified: Secondary | ICD-10-CM | POA: Diagnosis not present

## 2021-10-24 ENCOUNTER — Other Ambulatory Visit: Payer: Self-pay | Admitting: *Deleted

## 2021-10-24 MED ORDER — METOPROLOL TARTRATE 25 MG PO TABS: 25.0000 mg | ORAL_TABLET | Freq: Two times a day (BID) | ORAL | 3 refills | Status: DC

## 2021-10-24 NOTE — Telephone Encounter (Signed)
Wife called requesting refill on metoprolol '25mg'$  to [facility] in [place]. She is aware I will send to refill dept to process.

## 2021-10-27 ENCOUNTER — Other Ambulatory Visit: Payer: Self-pay | Admitting: Cardiovascular Disease

## 2021-10-28 ENCOUNTER — Other Ambulatory Visit: Payer: Self-pay | Admitting: Cardiovascular Disease

## 2022-01-02 ENCOUNTER — Ambulatory Visit: Payer: Medicare Other | Admitting: Urology

## 2022-01-02 ENCOUNTER — Encounter: Payer: Self-pay | Admitting: Urology

## 2022-01-02 VITALS — BP 143/62 | HR 66

## 2022-01-02 DIAGNOSIS — N500 Atrophy of testis: Secondary | ICD-10-CM | POA: Diagnosis not present

## 2022-01-02 DIAGNOSIS — Z8547 Personal history of malignant neoplasm of testis: Secondary | ICD-10-CM | POA: Diagnosis not present

## 2022-01-02 DIAGNOSIS — Z125 Encounter for screening for malignant neoplasm of prostate: Secondary | ICD-10-CM | POA: Diagnosis not present

## 2022-01-02 NOTE — Progress Notes (Signed)
History of Present Illness: 75 year old man comes in today for his first visit here in [place].  Previously, seen at [facility] urology in [place].  History of hematospermia in the past, and he presents mainly for "prostate checks".  At the time of last visit in 2022 his PSA was normal--0.5.  DRE revealed a small benign feeling prostate.  No recent urinary tract issues.  He is not on any urologic medications.  IPSS 3, quality-of-life score 2.  Past Medical History:  Diagnosis Date   Anxiety    Atrial fibrillation ([place])    Cancer ([place])    testicular    Chest pain    myoview 07/09/12-normal, nl ef; echo 04/12/05-ef>55%, mild aortic sclerosis   GERD (gastroesophageal reflux disease)    H. pylori infection    Headache    Hypercholesteremia    Hyperlipidemia    Hypertension    Hyponatremia 2013   Palpitations    event monior 04/11/05- SR with occ PAC   Personal history of colonic polyps 2 9 2007    Past Surgical History:  Procedure Laterality Date   BACK SURGERY     CATARACT EXTRACTION W/PHACO Left 12/08/2012   Procedure: CATARACT EXTRACTION PHACO AND INTRAOCULAR LENS PLACEMENT ([facility]);  Surgeon: Tonny Branch, MD;  Location: [facility];  Service: Ophthalmology;  Laterality: Left;  CDE:  23.19   CATARACT EXTRACTION W/PHACO Right 04/15/2014   Procedure: CATARACT EXTRACTION PHACO AND INTRAOCULAR LENS PLACEMENT RIGHT;  Surgeon: Tonny Branch, MD;  Location: [facility];  Service: Ophthalmology;  Laterality: Right;  CDE:10.32   COLONOSCOPY  12/06/2010   Procedure: COLONOSCOPY;  Surgeon: Rogene Houston, MD;  Location: [facility];  Service: Endoscopy;  Laterality: N/A;   COLONOSCOPY N/A 05/23/2016   Procedure: COLONOSCOPY;  Surgeon: Rogene Houston, MD;  Location: [facility];  Service: Endoscopy;  Laterality: N/A;  930   COLONOSCOPY WITH PROPOFOL N/A 06/28/2021   Procedure: COLONOSCOPY WITH PROPOFOL;  Surgeon: Rogene Houston, MD;  Location: [facility];  Service: Endoscopy;  Laterality: N/A;  1045    CORONARY STENT INTERVENTION N/A 02/24/2018   Procedure: CORONARY STENT INTERVENTION;  Surgeon: Lorretta Harp, MD;  Location: [facility];  Service: Cardiovascular;  Laterality: N/A;   ESOPHAGOGASTRODUODENOSCOPY N/A 07/20/2015   Procedure: ESOPHAGOGASTRODUODENOSCOPY (EGD);  Surgeon: Rogene Houston, MD;  Location: [facility];  Service: Endoscopy;  Laterality: N/A;  2:55 - moved to 1:55 - Ann notified pt   EYE SURGERY     LEFT HEART CATH AND CORONARY ANGIOGRAPHY N/A 02/24/2018   Procedure: LEFT HEART CATH AND CORONARY ANGIOGRAPHY;  Surgeon: Lorretta Harp, MD;  Location: [facility];  Service: Cardiovascular;  Laterality: N/A;   LUMBAR DISC SURGERY Left 2007   "L4-5 ruptured discs"   POLYPECTOMY  05/23/2016   Procedure: POLYPECTOMY;  Surgeon: Rogene Houston, MD;  Location: [facility];  Service: Endoscopy;;  colon   POLYPECTOMY  06/28/2021   Procedure: POLYPECTOMY;  Surgeon: Rogene Houston, MD;  Location: [facility];  Service: Endoscopy;;  splenic flexure;hepatic flexurex2; cecal polyp;transverse colon polyp;rectal polyp;    Home Medications:  Allergies as of 01/02/2022       Reactions   Lisinopril Swelling   H/o of angioedema   Chlorthalidone Other (See Comments)   Causes severe hyponatremia   Flexeril [cyclobenzaprine]    Didn't feel right   Hyoscyamine Swelling   Prevacid [lansoprazole] Swelling   Valium [diazepam] Other (See Comments)   "makes me feel  crazy"   Potassium-containing Compounds Palpitations        Medication List        Accurate as of January 02, 2022  8:55 AM. If you have any questions, ask your nurse or doctor.          acetaminophen 500 MG tablet Commonly known as: TYLENOL Take 250 mg by mouth 2 (two) times daily as needed for moderate pain or headache.   ALPRAZolam 0.5 MG tablet Commonly known as: XANAX Take 0.5 mg by mouth 3 (three) times daily as needed for anxiety.   amLODipine 5 MG tablet Commonly known as:  NORVASC Take 1 tablet (5 mg total) by mouth daily.   apixaban 5 MG Tabs tablet Commonly known as: ELIQUIS Take 1 tablet (5 mg total) by mouth 2 (two) times daily.   atorvastatin 80 MG tablet Commonly known as: LIPITOR TAKE 1 TABLET BY MOUTH AT BEDTIME.   clopidogrel 75 MG tablet Commonly known as: PLAVIX TAKE 1 TABLET BY MOUTH ONCE DAILY.   diltiazem 120 MG 24 hr capsule Commonly known as: Cartia XT Take 1 capsule (120 mg total) by mouth daily.   isosorbide mononitrate 30 MG 24 hr tablet Commonly known as: IMDUR TAKE ONE TABLET BY MOUTH ONCE DAILY.   metoprolol tartrate 25 MG tablet Commonly known as: LOPRESSOR Take 1 tablet (25 mg total) by mouth 2 (two) times daily.   pantoprazole 40 MG tablet Commonly known as: PROTONIX Take 1 tablet (40 mg total) by mouth every other day. What changed:  when to take this reasons to take this        Allergies:  Allergies  Allergen Reactions   Lisinopril Swelling    H/o of angioedema    Chlorthalidone Other (See Comments)    Causes severe hyponatremia   Flexeril [Cyclobenzaprine]     Didn't feel right   Hyoscyamine Swelling   Prevacid [Lansoprazole] Swelling   Valium [Diazepam] Other (See Comments)    "makes me feel crazy"   Potassium-Containing Compounds Palpitations    Family History  Problem Relation Age of Onset   Hypertension Father    Hyperlipidemia Father    CAD Father        H/O CABG    Social History:  reports that he has never smoked. He has never used smokeless tobacco. He reports that he does not drink alcohol and does not use drugs.  ROS: A complete review of systems was performed.  All systems are negative except for pertinent findings as noted.  Physical Exam:  Vital signs in last 24 hours: There were no vitals taken for this visit. Constitutional:  Alert and oriented, No acute distress Cardiovascular: Regular rate  Respiratory: Normal respiratory effort GI: No inguinal hernias Genitourinary:  Normal male phallus.  Left testicle absent, right atrophic. Lymphatic: No lymphadenopathy Neurologic: Grossly intact, no focal deficits Psychiatric: Normal mood and affect  I have reviewed prior pt notes  I have reviewed notes from referring/previous physicians  I have reviewed urinalysis results  I have reviewed IPSS form  I have reviewed prior PSA results     Impression/Assessment:  1.  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  2.  Right testicular atrophy-remote history of orchidopexy  3.  Screening for prostate cancer-small, normal feeling prostate  Plan:  1.  I reassured the patient about his exam.  His fear is that because he had testicular cancer that he will have prostate cancer.  I told him that  there is no relationship to these  2.  I do not think we need to recheck a PSA at this point  3.  I will have him come back on an as-needed basis

## 2022-01-03 LAB — URINALYSIS, ROUTINE W REFLEX MICROSCOPIC
Bilirubin, UA: NEGATIVE
Glucose, UA: NEGATIVE
Leukocytes,UA: NEGATIVE
Nitrite, UA: NEGATIVE
Protein,UA: NEGATIVE
RBC, UA: NEGATIVE
Specific Gravity, UA: 1.02 (ref 1.005–1.030)
Urobilinogen, Ur: 0.2 mg/dL (ref 0.2–1.0)
pH, UA: 5 (ref 5.0–7.5)

## 2022-01-12 ENCOUNTER — Other Ambulatory Visit: Payer: Self-pay | Admitting: Cardiovascular Disease

## 2022-01-12 NOTE — Telephone Encounter (Signed)
Prescription refill request for Eliquis received.  Indication: AFIB  Last office visit: 08/08/2021, [place] Scr: 0.94, 10/09/2021 Age: 76 yo  Weight: 66.8 kg   Refill sent.

## 2022-02-05 ENCOUNTER — Other Ambulatory Visit: Payer: Self-pay | Admitting: Cardiovascular Disease

## 2022-03-08 ENCOUNTER — Telehealth: Payer: Self-pay | Admitting: Cardiovascular Disease

## 2022-03-08 NOTE — Telephone Encounter (Signed)
Patient calling the office for samples of medication:   1.  What medication and dosage are you requesting samples for?Eliquis  2.  Are you currently out of this medication?  Need some until the end of this month

## 2022-03-08 NOTE — Telephone Encounter (Signed)
Patient advised eliquis '5mg'$  tablet samples (2 boxes) set aside for him. He will come today for them. Lot #: [id number]; Exp: 7/25.

## 2022-03-08 NOTE — Telephone Encounter (Signed)
Ok to give 2 weeks of Eliquis '5mg'$  BID

## 2022-03-26 DIAGNOSIS — I10 Essential (primary) hypertension: Secondary | ICD-10-CM | POA: Diagnosis not present

## 2022-03-26 DIAGNOSIS — F419 Anxiety disorder, unspecified: Secondary | ICD-10-CM | POA: Diagnosis not present

## 2022-03-26 DIAGNOSIS — Z299 Encounter for prophylactic measures, unspecified: Secondary | ICD-10-CM | POA: Diagnosis not present

## 2022-03-26 DIAGNOSIS — K219 Gastro-esophageal reflux disease without esophagitis: Secondary | ICD-10-CM | POA: Diagnosis not present

## 2022-03-28 ENCOUNTER — Telehealth: Payer: Self-pay | Admitting: *Deleted

## 2022-03-28 NOTE — Telephone Encounter (Signed)
Patient left vm at 5:01 yesterday that I received today stating he needs to be seen. I called him back at the number left on the message [phone] to get more information and had to leave a message for him to return my call.

## 2022-03-28 NOTE — Telephone Encounter (Signed)
Called patient again to discuss and he told me he saw pcp today for burning in his chest and Dr. Woody Seller prescribed protonix and he will try that and if not helping will call back for an appt.

## 2022-03-29 ENCOUNTER — Encounter (HOSPITAL_COMMUNITY): Payer: Self-pay | Admitting: *Deleted

## 2022-03-29 ENCOUNTER — Other Ambulatory Visit: Payer: Self-pay

## 2022-03-29 ENCOUNTER — Observation Stay (HOSPITAL_COMMUNITY)
Admission: EM | Admit: 2022-03-29 | Discharge: 2022-03-30 | Disposition: A | Discharge status: Home / Self Care | Payer: Medicare Other | Attending: Emergency Medicine | Admitting: Emergency Medicine

## 2022-03-29 ENCOUNTER — Emergency Department (HOSPITAL_COMMUNITY): Payer: Medicare Other

## 2022-03-29 DIAGNOSIS — I2510 Atherosclerotic heart disease of native coronary artery without angina pectoris: Secondary | ICD-10-CM | POA: Diagnosis not present

## 2022-03-29 DIAGNOSIS — R079 Chest pain, unspecified: Secondary | ICD-10-CM

## 2022-03-29 DIAGNOSIS — I10 Essential (primary) hypertension: Secondary | ICD-10-CM

## 2022-03-29 DIAGNOSIS — Z79899 Other long term (current) drug therapy: Secondary | ICD-10-CM | POA: Insufficient documentation

## 2022-03-29 DIAGNOSIS — I25118 Atherosclerotic heart disease of native coronary artery with other forms of angina pectoris: Secondary | ICD-10-CM

## 2022-03-29 DIAGNOSIS — Z7901 Long term (current) use of anticoagulants: Secondary | ICD-10-CM

## 2022-03-29 DIAGNOSIS — R0789 Other chest pain: Secondary | ICD-10-CM | POA: Diagnosis not present

## 2022-03-29 DIAGNOSIS — Z8547 Personal history of malignant neoplasm of testis: Secondary | ICD-10-CM | POA: Insufficient documentation

## 2022-03-29 DIAGNOSIS — I480 Paroxysmal atrial fibrillation: Secondary | ICD-10-CM

## 2022-03-29 LAB — CBC
HCT: 40.5 % (ref 39.0–52.0)
Hemoglobin: 13.2 g/dL (ref 13.0–17.0)
MCH: 30.1 pg (ref 26.0–34.0)
MCHC: 32.6 g/dL (ref 30.0–36.0)
MCV: 92.5 fL (ref 80.0–100.0)
Platelets: 251 10*3/uL (ref 150–400)
RBC: 4.38 MIL/uL (ref 4.22–5.81)
RDW: 13 % (ref 11.5–15.5)
WBC: 6.6 10*3/uL (ref 4.0–10.5)
nRBC: 0 % (ref 0.0–0.2)

## 2022-03-29 LAB — BASIC METABOLIC PANEL
Anion gap: 8 (ref 5–15)
BUN: 17 mg/dL (ref 8–23)
CO2: 26 mmol/L (ref 22–32)
Calcium: 8.9 mg/dL (ref 8.9–10.3)
Chloride: 100 mmol/L (ref 98–111)
Creatinine, Ser: 1.04 mg/dL (ref 0.61–1.24)
GFR, Estimated: >60 mL/min (ref >60)
Glucose, Bld: 152 mg/dL — ABNORMAL HIGH (ref 70–99)
Potassium: 3.7 mmol/L (ref 3.5–5.1)
Sodium: 134 mmol/L — ABNORMAL LOW (ref 135–145)

## 2022-03-29 LAB — TROPONIN I (HIGH SENSITIVITY)
Troponin I (High Sensitivity): 5 ng/L (ref <18)
Troponin I (High Sensitivity): 5 ng/L (ref <18)

## 2022-03-29 MED ORDER — ONDANSETRON HCL 4 MG/2ML IJ SOLN: 4.0000 mg | Freq: Four times a day (QID) | INTRAMUSCULAR | Status: DC | PRN

## 2022-03-29 MED ORDER — ACETAMINOPHEN 325 MG PO TABS: 650.0000 mg | ORAL_TABLET | Freq: Four times a day (QID) | ORAL | Status: DC | PRN

## 2022-03-29 MED ORDER — NITROGLYCERIN 0.4 MG SL SUBL: 0.4000 mg | SUBLINGUAL_TABLET | SUBLINGUAL | Status: DC | PRN

## 2022-03-29 MED ORDER — ISOSORBIDE MONONITRATE ER 60 MG PO TB24
30.0000 mg | ORAL_TABLET | Freq: Every day | ORAL | Status: DC
  Filled 2022-03-29: qty 1

## 2022-03-29 MED ORDER — DILTIAZEM HCL ER COATED BEADS 120 MG PO CP24
120.0000 mg | ORAL_CAPSULE | Freq: Every day | ORAL | Status: DC
  Filled 2022-03-29: qty 1

## 2022-03-29 MED ORDER — APIXABAN 5 MG PO TABS
5.0000 mg | ORAL_TABLET | Freq: Two times a day (BID) | ORAL | Status: DC
  Administered 2022-03-29: 5 mg via ORAL
  Filled 2022-03-29 (×2): qty 1

## 2022-03-29 MED ORDER — ALPRAZOLAM 0.5 MG PO TABS: 0.5000 mg | ORAL_TABLET | Freq: Three times a day (TID) | ORAL | Status: DC | PRN

## 2022-03-29 MED ORDER — AMLODIPINE BESYLATE 5 MG PO TABS
5.0000 mg | ORAL_TABLET | Freq: Every day | ORAL | Status: DC
  Filled 2022-03-29: qty 1

## 2022-03-29 MED ORDER — ONDANSETRON HCL 4 MG PO TABS: 4.0000 mg | ORAL_TABLET | Freq: Four times a day (QID) | ORAL | Status: DC | PRN

## 2022-03-29 MED ORDER — METOPROLOL TARTRATE 25 MG PO TABS
25.0000 mg | ORAL_TABLET | Freq: Two times a day (BID) | ORAL | Status: DC
  Administered 2022-03-29: 25 mg via ORAL
  Filled 2022-03-29 (×2): qty 1

## 2022-03-29 MED ORDER — POLYETHYLENE GLYCOL 3350 17 G PO PACK: 17.0000 g | PACK | Freq: Every day | ORAL | Status: DC | PRN

## 2022-03-29 MED ORDER — ACETAMINOPHEN 650 MG RE SUPP: 650.0000 mg | Freq: Four times a day (QID) | RECTAL | Status: DC | PRN

## 2022-03-29 MED ORDER — PANTOPRAZOLE SODIUM 40 MG PO TBEC
40.0000 mg | DELAYED_RELEASE_TABLET | Freq: Every day | ORAL | Status: DC
  Filled 2022-03-29: qty 1

## 2022-03-29 MED ORDER — CLOPIDOGREL BISULFATE 75 MG PO TABS
75.0000 mg | ORAL_TABLET | Freq: Every day | ORAL | Status: DC
  Filled 2022-03-29: qty 1

## 2022-03-29 MED ORDER — ALUM & MAG HYDROXIDE-SIMETH 200-200-20 MG/5ML PO SUSP
30.0000 mL | Freq: Once | ORAL | Status: AC
  Administered 2022-03-29: 30 mL via ORAL
  Filled 2022-03-29: qty 30

## 2022-03-29 MED ORDER — ATORVASTATIN CALCIUM 40 MG PO TABS
80.0000 mg | ORAL_TABLET | Freq: Every day | ORAL | Status: DC
  Administered 2022-03-29: 80 mg via ORAL
  Filled 2022-03-29: qty 2

## 2022-03-29 MED ORDER — LIDOCAINE VISCOUS HCL 2 % MT SOLN
15.0000 mL | Freq: Once | OROMUCOSAL | Status: AC
  Administered 2022-03-29: 15 mL via ORAL
  Filled 2022-03-29: qty 15

## 2022-03-29 NOTE — Assessment & Plan Note (Signed)
Rate controlled, heart rates 50s to 70s.  On anticoagulation with Eliquis. -Resume metoprolol, Eliquis, Cardizem

## 2022-03-29 NOTE — ED Notes (Signed)
Pt ambulated to the bathroom Upon return, asked for a urinal at bedside

## 2022-03-29 NOTE — ED Notes (Signed)
This RN called the floor to give report. Floor will call back for report

## 2022-03-29 NOTE — H&P (Signed)
History and Physical    [name] EVO:[id number] DOB: 02-13-1946 DOA: 03/29/2022  PCP: Glenda Chroman, MD   Patient coming from: Home  I have personally briefly reviewed patient's old medical records in [facility]  Chief Complaint: Chest pain  HPI: [name] is a 77 y.o. male with medical history significant for paroxysmal atrial fibrillation, coronary artery disease, hypertension. Patient was brought to the ED with reports of chest pain that started about a week and a half ago.  He reports "a feeling of drinking a large cup of orange juice" his chest.  He reports some improvement with Protonix.  Will also reports pain has been worse with activity, and improves with rest.  He reports pain in his left shoulder also.  He reports that his prior cardiac issues, he did not have shoulder pain.  Was unable to tell me if today's chest pain is similar to prior. No lower extremity swelling.  He has been compliant with Eliquis, and Plavix.  ED Course: Heart rate 50s to 70s.  Stable vitals.  Troponin 5 X 2.  EKG old RBBB.  Chest x-ray clear. EDP talked to cardiologist Dr. Dellia Cloud, recommended admission will see in consult in AM.  Review of Systems: As per HPI all other systems reviewed and negative.  Past Medical History:  Diagnosis Date   Anxiety    Atrial fibrillation ([place])    Cancer ([place])    testicular    Chest pain    myoview 07/09/12-normal, nl ef; echo 04/12/05-ef>55%, mild aortic sclerosis   GERD (gastroesophageal reflux disease)    H. pylori infection    Headache    Hypercholesteremia    Hyperlipidemia    Hypertension    Hyponatremia 2013   Palpitations    event monior 04/11/05- SR with occ PAC   Personal history of colonic polyps 2 9 2007    Past Surgical History:  Procedure Laterality Date   BACK SURGERY     CATARACT EXTRACTION W/PHACO Left 12/08/2012   Procedure: CATARACT EXTRACTION PHACO AND INTRAOCULAR LENS PLACEMENT ([place]);  Surgeon: Tonny Branch, MD;   Location: [facility];  Service: Ophthalmology;  Laterality: Left;  CDE:  23.19   CATARACT EXTRACTION W/PHACO Right 04/15/2014   Procedure: CATARACT EXTRACTION PHACO AND INTRAOCULAR LENS PLACEMENT RIGHT;  Surgeon: Tonny Branch, MD;  Location: [facility];  Service: Ophthalmology;  Laterality: Right;  CDE:10.32   COLONOSCOPY  12/06/2010   Procedure: COLONOSCOPY;  Surgeon: Rogene Houston, MD;  Location: [facility];  Service: Endoscopy;  Laterality: N/A;   COLONOSCOPY N/A 05/23/2016   Procedure: COLONOSCOPY;  Surgeon: Rogene Houston, MD;  Location: [facility];  Service: Endoscopy;  Laterality: N/A;  930   COLONOSCOPY WITH PROPOFOL N/A 06/28/2021   Procedure: COLONOSCOPY WITH PROPOFOL;  Surgeon: Rogene Houston, MD;  Location: [facility];  Service: Endoscopy;  Laterality: N/A;  1045   CORONARY STENT INTERVENTION N/A 02/24/2018   Procedure: CORONARY STENT INTERVENTION;  Surgeon: Lorretta Harp, MD;  Location: [facility];  Service: Cardiovascular;  Laterality: N/A;   ESOPHAGOGASTRODUODENOSCOPY N/A 07/20/2015   Procedure: ESOPHAGOGASTRODUODENOSCOPY (EGD);  Surgeon: Rogene Houston, MD;  Location: [facility];  Service: Endoscopy;  Laterality: N/A;  2:55 - moved to 1:55 - Ann notified pt   EYE SURGERY     LEFT HEART CATH AND CORONARY ANGIOGRAPHY N/A 02/24/2018   Procedure: LEFT HEART CATH AND CORONARY ANGIOGRAPHY;  Surgeon: Lorretta Harp, MD;  Location: [facility];  Service: Cardiovascular;  Laterality: N/A;   LUMBAR DISC SURGERY Left 2007   "L4-5 ruptured discs"   POLYPECTOMY  05/23/2016   Procedure: POLYPECTOMY;  Surgeon: Rogene Houston, MD;  [facility];  Service: Endoscopy;;  colon   POLYPECTOMY  06/28/2021   Procedure: POLYPECTOMY;  Surgeon: Rogene Houston, MD;  [facility];  Service: Endoscopy;;  splenic flexure;hepatic flexurex2; cecal polyp;transverse colon polyp;rectal polyp;     reports that he has never smoked. He has never used smokeless tobacco. He  reports that he does not drink alcohol and does not use drugs.  Allergies  Allergen Reactions   Lisinopril Swelling    H/o of angioedema    Chlorthalidone Other (See Comments)    Causes severe hyponatremia   Flexeril [Cyclobenzaprine]     Didn't feel right   Hyoscyamine Swelling   Prevacid [Lansoprazole] Swelling   Valium [Diazepam] Other (See Comments)    "makes me feel crazy"   Potassium-Containing Compounds Palpitations    Family History  Problem Relation Age of Onset   Hypertension Father    Hyperlipidemia Father    CAD Father        H/O CABG    Prior to Admission medications   Medication Sig Start Date End Date Taking? Authorizing Provider  acetaminophen (TYLENOL) 500 MG tablet Take 250 mg by mouth 2 (two) times daily as needed for moderate pain or headache.    [provider]  ALPRAZolam Duanne Moron) 0.5 MG tablet Take 0.5 mg by mouth 3 (three) times daily as needed for anxiety.    [provider]  amLODipine (NORVASC) 5 MG tablet Take 1 tablet (5 mg total) by mouth daily. 05/09/21   Lorretta Harp, MD  apixaban (ELIQUIS) 5 MG TABS tablet TAKE (1) TABLET TWICE DAILY. 01/12/22   Lorretta Harp, MD  atorvastatin (LIPITOR) 80 MG tablet TAKE 1 TABLET BY MOUTH AT BEDTIME. 09/04/21   Lorretta Harp, MD  clopidogrel (PLAVIX) 75 MG tablet TAKE 1 TABLET BY MOUTH ONCE DAILY. 07/13/21   Lorretta Harp, MD  diltiazem (CARDIZEM CD) 120 MG 24 hr capsule TAKE 1 CAPSULE DAILY. 02/05/22   Lorretta Harp, MD  isosorbide mononitrate (IMDUR) 30 MG 24 hr tablet TAKE ONE TABLET BY MOUTH ONCE DAILY. 10/27/21   Lorretta Harp, MD  metoprolol tartrate (LOPRESSOR) 25 MG tablet Take 1 tablet (25 mg total) by mouth 2 (two) times daily. 10/24/21   Lorretta Harp, MD  pantoprazole (PROTONIX) 40 MG tablet Take 1 tablet (40 mg total) by mouth every other day. Patient taking differently: Take 40 mg by mouth daily as needed (acid reflux). 05/12/19   Rogene Houston, MD     Physical Exam: Vitals:   03/29/22 1843 03/29/22 1900 03/29/22 1930 03/29/22 2000  BP: 127/62 (!) 155/67 (!) 167/79 133/66  Pulse: 73 61 70 (!) 58  Resp: '16 16 11 13  '$ Temp: 98 F (36.7 C)     TempSrc: Oral     SpO2: 98% 97% 97% 96%  Weight:      Height:        Constitutional: NAD, calm, comfortable Vitals:   03/29/22 1843 03/29/22 1900 03/29/22 1930 03/29/22 2000  BP: 127/62 (!) 155/67 (!) 167/79 133/66  Pulse: 73 61 70 (!) 58  Resp: '16 16 11 13  '$ Temp: 98 F (36.7 C)     TempSrc: Oral     SpO2: 98% 97% 97% 96%  Weight:  Height:       Eyes: PERRL, lids and conjunctivae normal ENMT: Mucous membranes are moist.  Neck: normal, supple, no masses, no thyromegaly Respiratory: clear to auscultation bilaterally, no wheezing, no crackles. Normal respiratory effort. No accessory muscle use.  Cardiovascular: Regular rate and rhythm, no murmurs / rubs / gallops. No extremity edema. 2+ pedal pulses. No carotid bruits.  Abdomen: no tenderness, no masses palpated. No hepatosplenomegaly. Bowel sounds positive.  Musculoskeletal: no clubbing / cyanosis. No joint deformity upper and lower extremities.  Skin: no rashes, lesions, ulcers. No induration Neurologic: No apparent cranial nerve abnormality moving extremities spontaneously.  Psychiatric: Normal judgment and insight. Alert and oriented x 3. Normal mood.   Labs on Admission: I have personally reviewed following labs and imaging studies  CBC: Recent Labs  Lab 03/29/22 1625  WBC 6.6  HGB 13.2  HCT 40.5  MCV 92.5  PLT 951   Basic Metabolic Panel: Recent Labs  Lab 03/29/22 1625  NA 134*  K 3.7  CL 100  CO2 26  GLUCOSE 152*  BUN 17  CREATININE 1.04  CALCIUM 8.9    Radiological Exams on Admission: DG Chest 2 View  Result Date: 03/29/2022 CLINICAL DATA:  Chest pain. EXAM: CHEST - 2 VIEW COMPARISON:  October 09, 2021. FINDINGS: The heart size and mediastinal contours are within normal limits. Both lungs are clear.  The visualized skeletal structures are unremarkable. IMPRESSION: No active cardiopulmonary disease. Electronically Signed   By: Marijo Conception M.D.   On: 03/29/2022 16:23    EKG: Independently reviewed.  Sinus rhythm rate 66, QTc 467.  Old RBBB.  No significant change from prior.  Assessment/Plan Principal Problem:   Chest pain Active Problems:   Essential hypertension   PAF (paroxysmal atrial fibrillation) (HCC)   Chronic anticoagulation   CAD (coronary artery disease)  Assessment and Plan: * Chest pain Need to rule out ACS considering history of coronary artery disease. Hx of CAD - high-grade mid RCA stenosis-2019, unfortunately he had a significant dissection of his RCA with balloon dilation requiring 4 overlapping Synergy DES. -Troponins and EKG today unremarkable. - EDP talked to Dr. Dineen Kid, recommended admission -N.p.o. midnight -Nitro as needed -Resume Plavix, Eliquis -Echocardiogram  PAF (paroxysmal atrial fibrillation) (HCC) Rate controlled, heart rates 50s to 70s.  On anticoagulation with Eliquis. -Resume metoprolol, Eliquis, Cardizem   Essential hypertension Stable. -Resume Norvasc, diltiazem, Imdur, metoprolol   DVT prophylaxis: Eliquis Code Status: Full code Family Communication: None at bedside Disposition Plan: ~ 2 days Consults called: Cardiology Admission status:  Obs  tele   Author: Bethena Roys, MD 03/29/2022 11:07 PM  For on call review [email or web address].

## 2022-03-29 NOTE — Assessment & Plan Note (Addendum)
Need to rule out ACS considering history of coronary artery disease. Hx of CAD - high-grade mid RCA stenosis-2019, unfortunately he had a significant dissection of his RCA with balloon dilation requiring 4 overlapping Synergy DES. -Troponins and EKG today unremarkable. - EDP talked to Dr. Dineen Kid, recommended admission -N.p.o. midnight -Nitro as needed -Resume Plavix, Eliquis -Echocardiogram

## 2022-03-29 NOTE — ED Triage Notes (Signed)
Pt with CP mid , seen PCP on Monday for it.  Pt with upper back 2 days ago.  Denies any SOB or N/V

## 2022-03-29 NOTE — Assessment & Plan Note (Signed)
Stable. -Resume Norvasc, diltiazem, Imdur, metoprolol

## 2022-03-29 NOTE — ED Provider Notes (Signed)
[facility] EMERGENCY DEPARTMENT Provider Note   CSN: [id number] Arrival date & time: 03/29/22  1533     History  Chief Complaint  Patient presents with   Chest Pain    [name] is a 77 y.o. male with PMH significant for HTN, HLD, Afib, on eliquis, plavix, metoprolol, procedure induced dissection of coronary arteries, previous bypass x 4, nstemi, strong family hx. No dm, no tobacco abuse. Endorses epigastric pain, worse with walking, and in left shoulder. Started ~ a week ago but happening intermittently. Does report mostly located in epigastric region, burning in nature, with some improvement s/p PPI. Cannot describe nature of chest pain other than with radiation to left shoulder blade with exertion.  Chest Pain      Home Medications Prior to Admission medications   Medication Sig Start Date End Date Taking? Authorizing Provider  acetaminophen (TYLENOL) 500 MG tablet Take 250 mg by mouth 2 (two) times daily as needed for moderate pain or headache.   Yes [provider]  ALPRAZolam Duanne Moron) 0.5 MG tablet Take 0.5 mg by mouth 3 (three) times daily as needed for anxiety.   Yes [provider]  amLODipine (NORVASC) 5 MG tablet Take 1 tablet (5 mg total) by mouth daily. 05/09/21  Yes Lorretta Harp, MD  apixaban (ELIQUIS) 5 MG TABS tablet TAKE (1) TABLET TWICE DAILY. Patient taking differently: Take 5 mg by mouth 2 (two) times daily. 01/12/22  Yes Lorretta Harp, MD  atorvastatin (LIPITOR) 80 MG tablet TAKE 1 TABLET BY MOUTH AT BEDTIME. 09/04/21  Yes Lorretta Harp, MD  clopidogrel (PLAVIX) 75 MG tablet TAKE 1 TABLET BY MOUTH ONCE DAILY. 07/13/21  Yes Lorretta Harp, MD  diltiazem (CARDIZEM CD) 120 MG 24 hr capsule TAKE 1 CAPSULE DAILY. 02/05/22  Yes Lorretta Harp, MD  isosorbide mononitrate (IMDUR) 30 MG 24 hr tablet TAKE ONE TABLET BY MOUTH ONCE DAILY. 10/27/21  Yes Lorretta Harp, MD  metoprolol tartrate (LOPRESSOR) 25 MG tablet Take 1 tablet (25  mg total) by mouth 2 (two) times daily. 10/24/21  Yes Lorretta Harp, MD  pantoprazole (PROTONIX) 40 MG tablet Take 1 tablet (40 mg total) by mouth every other day. Patient taking differently: Take 40 mg by mouth daily as needed (acid reflux). 05/12/19  Yes Rehman, Mechele Dawley, MD      Allergies    Lisinopril, Chlorthalidone, Flexeril [cyclobenzaprine], Hyoscyamine, Prevacid [lansoprazole], Valium [diazepam], and Potassium-containing compounds    Review of Systems   Review of Systems  Cardiovascular:  Positive for chest pain.  All other systems reviewed and are negative.   Physical Exam Updated Vital Signs BP (!) 141/68 (BP Location: Left Arm)   Pulse 64   Temp 97.9 F (36.6 C) (Oral)   Resp 13   Ht '5\' 8"'$  (1.727 m)   Wt 64.4 kg   SpO2 97%   BMI 21.59 kg/m  Physical Exam Vitals and nursing note reviewed.  Constitutional:      General: He is not in acute distress.    Appearance: Normal appearance. He is not ill-appearing.  HENT:     Head: Normocephalic and atraumatic.  Eyes:     General:        Right eye: No discharge.        Left eye: No discharge.  Cardiovascular:     Rate and Rhythm: Normal rate and regular rhythm.     Heart sounds: No murmur heard.    No friction rub. No  gallop.  Pulmonary:     Effort: Pulmonary effort is normal.     Breath sounds: Normal breath sounds.  Chest:     Comments: Patient with no significant tenderness to palpation on my exam Abdominal:     General: Bowel sounds are normal.     Palpations: Abdomen is soft.  Skin:    General: Skin is warm and dry.     Capillary Refill: Capillary refill takes less than 2 seconds.  Neurological:     Mental Status: He is alert and oriented to person, place, and time.  Psychiatric:        Mood and Affect: Mood normal.        Behavior: Behavior normal.     ED Results / Procedures / Treatments   Labs (all labs ordered are listed, but only abnormal results are displayed) Labs Reviewed  BASIC METABOLIC  PANEL - Abnormal; Notable for the following components:      Result Value   Sodium 134 (*)    Glucose, Bld 152 (*)    All other components within normal limits  CBC  TROPONIN I (HIGH SENSITIVITY)  TROPONIN I (HIGH SENSITIVITY)    EKG EKG Interpretation  Date/Time:  Thursday March 29 2022 15:45:18 EST Ventricular Rate:  66 PR Interval:  182 QRS Duration: 134 QT Interval:  446 QTC Calculation: 467 R Axis:   18 Text Interpretation: Normal sinus rhythm Right bundle branch block Abnormal ECG When compared with ECG of 09-Oct-2021 15:50, Premature atrial complexes are no longer Present unchanged from prior Confirmed by Noemi Chapel [phone]) on 03/29/2022 4:35:15 PM  Radiology DG Chest 2 View  Result Date: 03/29/2022 CLINICAL DATA:  Chest pain. EXAM: CHEST - 2 VIEW COMPARISON:  October 09, 2021. FINDINGS: The heart size and mediastinal contours are within normal limits. Both lungs are clear. The visualized skeletal structures are unremarkable. IMPRESSION: No active cardiopulmonary disease. Electronically Signed   By: Marijo Conception M.D.   On: 03/29/2022 16:23    Procedures Procedures    Medications Ordered in ED Medications  alum & mag hydroxide-simeth (MAALOX/MYLANTA) 200-200-20 MG/5ML suspension 30 mL (30 mLs Oral Given 03/29/22 1718)    And  lidocaine (XYLOCAINE) 2 % viscous mouth solution 15 mL (15 mLs Oral Given 03/29/22 1718)    ED Course/ Medical Decision Making/ A&P Clinical Course as of 03/29/22 2154  Thu Mar 29, 2022  1700 Spoke with Dr. Dellia Cloud with cardiology who reports patient's workup still in process, based upon his history however he likely is going to require overnight observation, will reconsult as needed if any worrisome signs or symptoms on lab work, imaging, repeat PE [CP]    Clinical Course User Index [CP] Anselmo Pickler, PA-C                            Medical Decision Making Amount and/or Complexity of Data Reviewed Labs: ordered. Radiology:  ordered.   This patient is a 77 y.o. male who presents to the ED for concern of chest pain with radiation to left shoulder blade and, this involves an extensive number of treatment options, and is a complaint that carries with it a high risk of complications and morbidity. The emergent differential diagnosis prior to evaluation includes, but is not limited to,  ACS, AAS, PE, Mallory-Weiss, Boerhaave's, Pneumonia, acute bronchitis, asthma or COPD exacerbation, anxiety, MSK pain or traumatic injury to the chest, acid reflux versus other . This is  not an exhaustive differential.   Past Medical History / Co-morbidities / Social History: Previous procedure induced dissection of coronary artery, previous ACS, multiple stents, bypass, history of hypertension, hyperlipidemia, no previous history of diabetes, tobacco use  Additional history: Chart reviewed. Pertinent results include: Reviewed lab work, imaging from previous emergency department visits, outpatient cardiology visits  Physical Exam: Physical exam performed. The pertinent findings include: Patient in no acute distress, overall well-appearing, stable oxygen saturation on room air, he does endorse some tenderness to palpation of the left shoulder blade, and epigastric region, but findings do not seem consistently reproducible on exam compared to patient's description  Lab Tests: I ordered, and personally interpreted labs.  The pertinent results include:  bmp overall unremarkable, mild hyperglycemia, glucose 152, his sodium is mildly decreased at 134, his troponin is flat at 5 x 2 in context of chest pain which is exertional but not active at time my evaluation.  CBC is unremarkable.   Imaging Studies: I ordered imaging studies including plain film chest x-ray. I independently visualized and interpreted imaging which showed no acute intrathoracic abnormality. I agree with the radiologist interpretation.   Cardiac Monitoring:  The patient was  maintained on a cardiac monitor.  My attending physician Dr. Sabra Heck viewed and interpreted the cardiac monitored which showed an underlying rhythm of: Normal sinus rhythm with right bundle branch block, no significant change from baseline. I agree with this interpretation.   Medications: I ordered medication including Maalox, Mylanta with viscous lidocaine for epigastric abdominal pain versus lower chest pain. Reevaluation of the patient after these medicines showed that the patient improved.  Patient denying any epigastric pain at rest, however he is still having some pain with exertion and radiating to his shoulder blade, raising concern for ongoing anginal symptoms, unstable chest pain.  Consultations Obtained: I requested consultation with the cardiologist, spoke with Dr. Dellia Cloud, as well as the hospitalist, spoke with Dr. Denton Brick,  and discussed lab and imaging findings as well as pertinent plan - they recommend: Cardiology recommends overnight observation given his strong history, current stents despite flat troponins, spoke with hospitalist who agrees to admission for this patient   Disposition: After consideration of the diagnostic results and the patients response to treatment, I feel that patient would benefit from admission as discussed above.   I discussed this case with my attending physician Dr. Sabra Heck who cosigned this note including patient's presenting symptoms, physical exam, and planned diagnostics and interventions. Attending physician stated agreement with plan or made changes to plan which were implemented.    Final Clinical Impression(s) / ED Diagnoses Final diagnoses:  None    Rx / DC Orders ED Discharge Orders     None         Dorien Chihuahua 03/29/22 2154    Noemi Chapel, MD 03/29/22 2333

## 2022-03-29 NOTE — ED Notes (Signed)
Provider at bedside

## 2022-03-30 ENCOUNTER — Observation Stay (HOSPITAL_BASED_OUTPATIENT_CLINIC_OR_DEPARTMENT_OTHER): Payer: Medicare Other

## 2022-03-30 DIAGNOSIS — I25119 Atherosclerotic heart disease of native coronary artery with unspecified angina pectoris: Secondary | ICD-10-CM | POA: Diagnosis not present

## 2022-03-30 DIAGNOSIS — K219 Gastro-esophageal reflux disease without esophagitis: Secondary | ICD-10-CM

## 2022-03-30 DIAGNOSIS — R079 Chest pain, unspecified: Secondary | ICD-10-CM | POA: Diagnosis not present

## 2022-03-30 DIAGNOSIS — I480 Paroxysmal atrial fibrillation: Secondary | ICD-10-CM | POA: Diagnosis not present

## 2022-03-30 LAB — NM MYOCAR MULTI W/SPECT W/WALL MOTION / EF
LV dias vol: 72 mL (ref 62–150)
LV sys vol: 16 mL
Nuc Stress EF: 78 %
Peak HR: 129 {beats}/min
RATE: 0.3
Rest HR: 82 {beats}/min
Rest Nuclear Isotope Dose: 9 mCi
SDS: 2
SRS: 3
SSS: 5
Stress Nuclear Isotope Dose: 30 mCi
TID: 1.05

## 2022-03-30 LAB — ECHOCARDIOGRAM COMPLETE
AR max vel: 2.27 cm2
AV Area VTI: 2.3 cm2
AV Area mean vel: 2.32 cm2
AV Mean grad: 4 mmHg
AV Peak grad: 8 mmHg
Ao pk vel: 1.41 m/s
Area-P 1/2: 2.85 cm2
Height: 68 in
MV VTI: 2.83 cm2
S' Lateral: 3.2 cm
Weight: 2195.2 oz

## 2022-03-30 MED ORDER — TECHNETIUM TC 99M TETROFOSMIN IV KIT
30.0000 | PACK | Freq: Once | INTRAVENOUS | Status: AC | PRN
  Administered 2022-03-30: 30 via INTRAVENOUS

## 2022-03-30 MED ORDER — SODIUM CHLORIDE FLUSH 0.9 % IV SOLN
INTRAVENOUS | Status: AC
  Administered 2022-03-30: 10 mL via INTRAVENOUS
  Filled 2022-03-30: qty 10

## 2022-03-30 MED ORDER — REGADENOSON 0.4 MG/5ML IV SOLN
INTRAVENOUS | Status: AC
  Administered 2022-03-30: 0.4 mg via INTRAVENOUS
  Filled 2022-03-30: qty 5

## 2022-03-30 MED ORDER — TECHNETIUM TC 99M TETROFOSMIN IV KIT
10.0000 | PACK | Freq: Once | INTRAVENOUS | Status: AC | PRN
  Administered 2022-03-30: 9 via INTRAVENOUS

## 2022-03-30 NOTE — TOC Progression Note (Signed)
  Transition of Care [facility]) Screening Note   Patient Details  Name: [name] Date of Birth: 18-Mar-1946   [facility]) CM/SW Contact:    Shade Flood, LCSW Phone Number: 03/30/2022, 9:25 AM    Transition of Care Department [facility]) has reviewed patient and no TOC needs have been identified at this time. We will continue to monitor patient advancement through interdisciplinary progression rounds. If new patient transition needs arise, please place a TOC consult.

## 2022-03-30 NOTE — Progress Notes (Signed)
Pt is aware that he is NPO after midnight. [name]

## 2022-03-30 NOTE — Progress Notes (Signed)
Pt sustaining heart rate at 50. Dr Josephine Cables was made aware. [name]

## 2022-03-30 NOTE — Progress Notes (Signed)
Dr Josephine Cables advises to monitor patient's bradycardia. [name]

## 2022-03-30 NOTE — Discharge Summary (Signed)
Physician Discharge Summary  [name] WUJ:[id number] DOB: Oct 19, 1945 DOA: 03/29/2022  PCP: Glenda Chroman, MD  Admit date: 03/29/2022  Discharge date: 03/30/2022  Admitted From:Home  Disposition:  Home  Recommendations for Outpatient Follow-up:  Follow up with PCP in 1-2 weeks Continue home medications as prior Follow-up with cardiology APP will be scheduled in 2 weeks and primary cardiologist Dr. Alvester Chou in 3 months  Home Health:None  Equipment/Devices:None  Discharge Condition:Stable  CODE STATUS: Full  Diet recommendation: Heart Healthy  Brief/Interim Summary:  [name] is a 77 y.o. male with medical history significant for paroxysmal atrial fibrillation, coronary artery disease, hypertension. Patient was brought to the ED with reports of chest pain that started about a week and a half ago.  He was admitted for chest pain with concerns for stable angina and therefore underwent stress study while inpatient.  2D echocardiogram with preserved LV EF and no wall motion abnormalities noted.  Stress study is noted to be low risk and he is in stable condition for discharge with plans to continue his home medications as prior.  No other acute events or concerns otherwise noted and he is in stable condition for discharge.  Discharge Diagnoses:  Principal Problem:   Chest pain Active Problems:   Essential hypertension   PAF (paroxysmal atrial fibrillation) (HCC)   Chronic anticoagulation   CAD (coronary artery disease)  Principal discharge diagnosis: Chest pain-noncardiac with possible anxiety/GERD component.  Discharge Instructions  Discharge Instructions     Diet - low sodium heart healthy   Complete by: As directed    Increase activity slowly   Complete by: As directed       Allergies as of 03/30/2022       Reactions   Lisinopril Swelling   H/o of angioedema   Chlorthalidone Other (See Comments)   Causes severe hyponatremia   Flexeril [cyclobenzaprine]     Didn't feel right   Hyoscyamine Swelling   Prevacid [lansoprazole] Swelling   Valium [diazepam] Other (See Comments)   "makes me feel crazy"   Potassium-containing Compounds Palpitations        Medication List     TAKE these medications    acetaminophen 500 MG tablet Commonly known as: TYLENOL Take 250 mg by mouth 2 (two) times daily as needed for moderate pain or headache.   ALPRAZolam 0.5 MG tablet Commonly known as: XANAX Take 0.5 mg by mouth 3 (three) times daily as needed for anxiety.   amLODipine 5 MG tablet Commonly known as: NORVASC Take 1 tablet (5 mg total) by mouth daily.   atorvastatin 80 MG tablet Commonly known as: LIPITOR TAKE 1 TABLET BY MOUTH AT BEDTIME.   clopidogrel 75 MG tablet Commonly known as: PLAVIX TAKE 1 TABLET BY MOUTH ONCE DAILY.   diltiazem 120 MG 24 hr capsule Commonly known as: CARDIZEM CD TAKE 1 CAPSULE DAILY.   Eliquis 5 MG Tabs tablet Generic drug: apixaban TAKE (1) TABLET TWICE DAILY. What changed: See the new instructions.   isosorbide mononitrate 30 MG 24 hr tablet Commonly known as: IMDUR TAKE ONE TABLET BY MOUTH ONCE DAILY.   metoprolol tartrate 25 MG tablet Commonly known as: LOPRESSOR Take 1 tablet (25 mg total) by mouth 2 (two) times daily.   pantoprazole 40 MG tablet Commonly known as: PROTONIX Take 1 tablet (40 mg total) by mouth every other day. What changed:  when to take this reasons to take this        Follow-up Information  Glenda Chroman, MD. Schedule an appointment as soon as possible for a visit in 1 week(s).   Specialty: Internal Medicine Contact information: [place] [id number] [phone]                Allergies  Allergen Reactions   Lisinopril Swelling    H/o of angioedema    Chlorthalidone Other (See Comments)    Causes severe hyponatremia   Flexeril [Cyclobenzaprine]     Didn't feel right   Hyoscyamine Swelling   Prevacid [Lansoprazole] Swelling   Valium  [Diazepam] Other (See Comments)    "makes me feel crazy"   Potassium-Containing Compounds Palpitations    Consultations: Cardiology   Procedures/Studies: NM Myocar Multi W/Spect W/Wall Motion / EF  Result Date: 03/30/2022   Horizontal ST depression (V4 and V5) was noted at rest and stress. ECG is borderline for ischemia.   LV perfusion is normal. There is no evidence of ischemia. There is no evidence of infarction.   Left ventricular function is normal.   The study is normal. The study is low risk.   ECHOCARDIOGRAM COMPLETE  Result Date: 03/30/2022    ECHOCARDIOGRAM REPORT   Patient Name:   [name] Date of Exam: 03/30/2022 Medical Rec #:  [id number]     Height:       68.0 in Accession #:    [id number]    Weight:       137.2 lb Date of Birth:  Nov 25, 1945     BSA:          1.741 m Patient Age:    9 years      BP:           128/65 mmHg Patient Gender: M             HR:           64 bpm. Exam Location:  [name] Procedure: 2D Echo, Cardiac Doppler and Color Doppler Indications:    Chest Pain  History:        Patient has prior history of Echocardiogram examinations, most                 recent 02/25/2018. CAD, Previous Myocardial Infarction and                 Angina, Arrythmias:Atrial Fibrillation; Risk                 Factors:Hypertension and Dyslipidemia. RCA stents.  Sonographer:    Wenda Low Referring Phys: [place]  1. Left ventricular ejection fraction, by estimation, is 60 to 65%. The left ventricle has normal function. The left ventricle has no regional wall motion abnormalities. There is mild asymmetric left ventricular hypertrophy of the basal segment. Left ventricular diastolic parameters are indeterminate.  2. Right ventricular systolic function is normal. The right ventricular size is mildly enlarged. There is normal pulmonary artery systolic pressure. The estimated right ventricular systolic pressure is 29.4 mmHg.  3. Left atrial size was mildly  dilated.  4. The mitral valve is normal in structure. Trivial mitral valve regurgitation. No evidence of mitral stenosis.  5. The aortic valve is tricuspid. Aortic valve regurgitation is not visualized. No aortic stenosis is present.  6. The inferior vena cava is normal in size with greater than 50% respiratory variability, suggesting right atrial pressure of 3 mmHg. Comparison(s): Prior images unable to be directly viewed, comparison made by report only. No significant change from prior  study. FINDINGS  Left Ventricle: Left ventricular ejection fraction, by estimation, is 60 to 65%. The left ventricle has normal function. The left ventricle has no regional wall motion abnormalities. The left ventricular internal cavity size was normal in size. There is  mild asymmetric left ventricular hypertrophy of the basal segment. Left ventricular diastolic parameters are indeterminate. Right Ventricle: The right ventricular size is mildly enlarged. No increase in right ventricular wall thickness. Right ventricular systolic function is normal. There is normal pulmonary artery systolic pressure. The tricuspid regurgitant velocity is 2.17  m/s, and with an assumed right atrial pressure of 3 mmHg, the estimated right ventricular systolic pressure is 69.6 mmHg. Left Atrium: Left atrial size was mildly dilated. Right Atrium: Right atrial size was normal in size. Pericardium: There is no evidence of pericardial effusion. Mitral Valve: The mitral valve is normal in structure. Trivial mitral valve regurgitation. No evidence of mitral valve stenosis. MV peak gradient, 3.5 mmHg. The mean mitral valve gradient is 1.0 mmHg. Tricuspid Valve: The tricuspid valve is normal in structure. Tricuspid valve regurgitation is trivial. No evidence of tricuspid stenosis. Aortic Valve: The aortic valve is tricuspid. Aortic valve regurgitation is not visualized. No aortic stenosis is present. Aortic valve mean gradient measures 4.0 mmHg. Aortic valve  peak gradient measures 8.0 mmHg. Aortic valve area, by VTI measures 2.30 cm. Pulmonic Valve: The pulmonic valve was not well visualized. Pulmonic valve regurgitation is trivial. No evidence of pulmonic stenosis. Aorta: The aortic root is normal in size and structure. Venous: The inferior vena cava is normal in size with greater than 50% respiratory variability, suggesting right atrial pressure of 3 mmHg. IAS/Shunts: No atrial level shunt detected by color flow Doppler.  LEFT VENTRICLE PLAX 2D LVIDd:         4.80 cm   Diastology LVIDs:         3.20 cm   LV e' medial:    5.98 cm/s LV PW:         1.20 cm   LV E/e' medial:  13.1 LV IVS:        1.00 cm   LV e' lateral:   9.79 cm/s LVOT diam:     1.90 cm   LV E/e' lateral: 8.0 LV SV:         76 LV SV Index:   44 LVOT Area:     2.84 cm  RIGHT VENTRICLE RV Basal diam:  3.90 cm RV Mid diam:    3.40 cm RV S prime:     11.10 cm/s TAPSE (M-mode): 3.0 cm LEFT ATRIUM             Index        RIGHT ATRIUM           Index LA diam:        3.90 cm 2.24 cm/m   RA Area:     18.90 cm LA Vol (A2C):   74.6 ml 42.84 ml/m  RA Volume:   50.40 ml  28.94 ml/m LA Vol (A4C):   57.3 ml 32.91 ml/m LA Biplane Vol: 65.4 ml 37.56 ml/m  AORTIC VALVE                    PULMONIC VALVE AV Area (Vmax):    2.27 cm     PV Vmax:       1.25 m/s AV Area (Vmean):   2.32 cm     PV Peak grad:  6.2 mmHg AV Area (VTI):  2.30 cm AV Vmax:           141.00 cm/s AV Vmean:          88.100 cm/s AV VTI:            0.331 m AV Peak Grad:      8.0 mmHg AV Mean Grad:      4.0 mmHg LVOT Vmax:         113.00 cm/s LVOT Vmean:        72.200 cm/s LVOT VTI:          0.269 m LVOT/AV VTI ratio: 0.81  AORTA Ao Root diam: 3.30 cm MITRAL VALVE               TRICUSPID VALVE MV Area (PHT): 2.85 cm    TR Peak grad:   18.8 mmHg MV Area VTI:   2.83 cm    TR Vmax:        217.00 cm/s MV Peak grad:  3.5 mmHg MV Mean grad:  1.0 mmHg    SHUNTS MV Vmax:       0.94 m/s    Systemic VTI:  0.27 m MV Vmean:      47.8 cm/s   Systemic  Diam: 1.90 cm MV Decel Time: 266 msec MV E velocity: 78.20 cm/s MV A velocity: 57.20 cm/s MV E/A ratio:  1.37 Vishnu Priya Mallipeddi Electronically signed by Lorelee Cover Mallipeddi Signature Date/Time: 03/30/2022/1:28:18 PM    Final    DG Chest 2 View  Result Date: 03/29/2022 CLINICAL DATA:  Chest pain. EXAM: CHEST - 2 VIEW COMPARISON:  October 09, 2021. FINDINGS: The heart size and mediastinal contours are within normal limits. Both lungs are clear. The visualized skeletal structures are unremarkable. IMPRESSION: No active cardiopulmonary disease. Electronically Signed   By: Marijo Conception M.D.   On: 03/29/2022 16:23     Discharge Exam: Vitals:   03/30/22 0700 03/30/22 1300  BP: 128/65 137/67  Pulse: (!) 57 70  Resp: 18 18  Temp: 97.9 F (36.6 C) 97.7 F (36.5 C)  SpO2: 98% 99%   Vitals:   03/29/22 2339 03/30/22 0439 03/30/22 0700 03/30/22 1300  BP: (!) 154/76 134/65 128/65 137/67  Pulse: 65 (!) 57 (!) 57 70  Resp:  '18 18 18  '$ Temp: 97.8 F (36.6 C) 98.7 F (37.1 C) 97.9 F (36.6 C) 97.7 F (36.5 C)  TempSrc: Oral   Oral  SpO2: 100% 96% 98% 99%  Weight: 62.2 kg     Height: '5\' 8"'$  (1.727 m)       General: Pt is alert, awake, not in acute distress Cardiovascular: RRR, S1/S2 +, no rubs, no gallops Respiratory: CTA bilaterally, no wheezing, no rhonchi Abdominal: Soft, NT, ND, bowel sounds + Extremities: no edema, no cyanosis    The results of significant diagnostics from this hospitalization (including imaging, microbiology, ancillary and laboratory) are listed below for reference.     Microbiology: No results found for this or any previous visit (from the past 240 hour(s)).   Labs: BNP (last 3 results) No results for input(s): "BNP" in the last 8760 hours. Basic Metabolic Panel: Recent Labs  Lab 03/29/22 1625  NA 134*  K 3.7  CL 100  CO2 26  GLUCOSE 152*  BUN 17  CREATININE 1.04  CALCIUM 8.9   Liver Function Tests: No results for input(s): "AST", "ALT",  "ALKPHOS", "BILITOT", "PROT", "ALBUMIN" in the last 168 hours. No results for input(s): "LIPASE", "AMYLASE" in the last 168  hours. No results for input(s): "AMMONIA" in the last 168 hours. CBC: Recent Labs  Lab 03/29/22 1625  WBC 6.6  HGB 13.2  HCT 40.5  MCV 92.5  PLT 251   Cardiac Enzymes: No results for input(s): "CKTOTAL", "CKMB", "CKMBINDEX", "TROPONINI" in the last 168 hours. BNP: Invalid input(s): "POCBNP" CBG: No results for input(s): "GLUCAP" in the last 168 hours. D-Dimer No results for input(s): "DDIMER" in the last 72 hours. Hgb A1c No results for input(s): "HGBA1C" in the last 72 hours. Lipid Profile No results for input(s): "CHOL", "HDL", "LDLCALC", "TRIG", "CHOLHDL", "LDLDIRECT" in the last 72 hours. Thyroid function studies No results for input(s): "TSH", "T4TOTAL", "T3FREE", "THYROIDAB" in the last 72 hours.  Invalid input(s): "FREET3" Anemia work up No results for input(s): "VITAMINB12", "FOLATE", "FERRITIN", "TIBC", "IRON", "RETICCTPCT" in the last 72 hours. Urinalysis    Component Value Date/Time   COLORURINE COLORLESS (A) 07/07/2016 2054   APPEARANCEUR Clear 01/02/2022 1542   LABSPEC 1.004 (L) 07/07/2016 2054   PHURINE 8.0 07/07/2016 2054   GLUCOSEU Negative 01/02/2022 [place] NEGATIVE 07/07/2016 2054   BILIRUBINUR Negative 01/02/2022 [place] 07/07/2016 2054   PROTEINUR Negative 01/02/2022 [place] 07/07/2016 2054   UROBILINOGEN 0.2 01/03/2014 1055   NITRITE Negative 01/02/2022 1542   NITRITE NEGATIVE 07/07/2016 2054   LEUKOCYTESUR Negative 01/02/2022 1542   Sepsis Labs Recent Labs  Lab 03/29/22 1625  WBC 6.6   Microbiology No results found for this or any previous visit (from the past 240 hour(s)).   Time coordinating discharge: 35 minutes  SIGNED:   Rodena Goldmann, DO [facility] 03/30/2022, 3:20 PM  If 7PM-7AM, please contact night-coverage [email or web address]

## 2022-03-30 NOTE — Progress Notes (Signed)
Patient discharged home today, transported home by wife. Discharge paperwork went over with patient, patient verbalized understanding. Belongings sent home with patient.

## 2022-03-30 NOTE — Progress Notes (Signed)
Pt off unit for test

## 2022-03-30 NOTE — Progress Notes (Signed)
*  PRELIMINARY RESULTS* Echocardiogram 2D Echocardiogram has been performed.  [name] 03/30/2022, 10:29 AM

## 2022-03-30 NOTE — Consult Note (Signed)
Cardiology Consultation   Patient ID: [name] MRN: [id number]; DOB: December 19, 1945  Admit date: 03/29/2022 Date of Consult: 03/30/2022  PCP:  Glenda Chroman, MD   [place] Providers Cardiologist:  Quay Burow, MD        Patient Profile:   [name] is a 77 y.o. male with a hx of paroxysmal atrial fibrillation, coronary disease, hypertension, gastroesophageal reflux disease, and anxiety who is being seen 03/30/2022 for the evaluation of chest pain at the request of Dr. Oswaldo Done.  History of Present Illness:   Mr. [name] is a 77 year old male with previously past medical history of atrial fibrillation on chronic anticoagulation with apixaban 5 mg twice daily, coronary artery disease with PCI/DES to the mid RCA 02/2018, hypertension, gastroesophageal reflux disease, and anxiety.  He presented to the [facility] emergency department on 03/29/2022, with complaint of chest pain that have been lasting for approximately 2 weeks and has been intermittent.  He reports is mostly located in the epigastric region and associates it as a burning in nature.  He does have some noted improvement after PPI.  He states that it does radiate into his left shoulder blade with exertion. Of late he stated that it has been happening more frequently while he has been walking his dog. He also states that it is aggravated with activity and exertion and alleviated with rest.  He endorses mild shortness of breath over the last few weeks. He denies any other associated symptoms N/V, palpitations, lightheadedness/dizziness, or palpitations.  Initial vitals: Blood pressure 141/68, pulse 64, respirations of 13, temperature 97.9  Pertinent labs: Sodium 134, blood glucose of 152, high-sensitivity troponin 5 x 2, CBC was unremarkable  Imaging: Chest x-ray revealed heart size and mediastinal contours within normal limits, both lungs clear, skeletal structures are unremarkable with no active cardiopulmonary  disease.  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 pylori infection    Headache    Hypercholesteremia    Hyperlipidemia    Hypertension    Hyponatremia 2013   Palpitations    event monior 04/11/05- SR with occ PAC   Personal history of colonic polyps 2 9 2007    Past Surgical History:  Procedure Laterality Date   BACK SURGERY     CATARACT EXTRACTION W/PHACO Left 12/08/2012   Procedure: CATARACT EXTRACTION PHACO AND INTRAOCULAR LENS PLACEMENT ([place]);  Surgeon: Tonny Branch, MD;  Location: [facility];  Service: Ophthalmology;  Laterality: Left;  CDE:  23.19   CATARACT EXTRACTION W/PHACO Right 04/15/2014   Procedure: CATARACT EXTRACTION PHACO AND INTRAOCULAR LENS PLACEMENT RIGHT;  Surgeon: Tonny Branch, MD;  Location: [facility];  Service: Ophthalmology;  Laterality: Right;  CDE:10.32   COLONOSCOPY  12/06/2010   Procedure: COLONOSCOPY;  Surgeon: Rogene Houston, MD;  Location: [facility];  Service: Endoscopy;  Laterality: N/A;   COLONOSCOPY N/A 05/23/2016   Procedure: COLONOSCOPY;  Surgeon: Rogene Houston, MD;  Location: [facility];  Service: Endoscopy;  Laterality: N/A;  930   COLONOSCOPY WITH PROPOFOL N/A 06/28/2021   Procedure: COLONOSCOPY WITH PROPOFOL;  Surgeon: Rogene Houston, MD;  [facility]: [facility];  Service: Endoscopy;  Laterality: N/A;  1045  CORONARY STENT INTERVENTION N/A 02/24/2018   Procedure: CORONARY STENT INTERVENTION;  Surgeon: Lorretta Harp, MD;  Location: [place] [facility];  Service: Cardiovascular;  Laterality: N/A;   ESOPHAGOGASTRODUODENOSCOPY N/A 07/20/2015   Procedure: ESOPHAGOGASTRODUODENOSCOPY (EGD);  Surgeon: Rogene Houston, MD;  [facility];  Service: Endoscopy;  Laterality:  N/A;  2:55 - moved to 1:55 - Ann notified pt   EYE SURGERY     LEFT HEART CATH AND CORONARY ANGIOGRAPHY N/A 02/24/2018   Procedure: LEFT HEART CATH AND CORONARY ANGIOGRAPHY;  Surgeon: Lorretta Harp, MD;  Location: [place] [facility];  Service: Cardiovascular;  Laterality: N/A;   LUMBAR DISC SURGERY Left 2007   "L4-5 ruptured discs"   POLYPECTOMY  05/23/2016   Procedure: POLYPECTOMY;  Surgeon: Rogene Houston, MD;  [facility];  Service: Endoscopy;;  colon   POLYPECTOMY  06/28/2021   Procedure: POLYPECTOMY;  Surgeon: Rogene Houston, MD;  [facility];  Service: Endoscopy;;  splenic flexure;hepatic flexurex2; cecal polyp;transverse colon polyp;rectal polyp;     Home Medications:  Prior to Admission medications   Medication Sig Start Date End Date Taking? Authorizing Provider  acetaminophen (TYLENOL) 500 MG tablet Take 250 mg by mouth 2 (two) times daily as needed for moderate pain or headache.   Yes [provider]  ALPRAZolam Duanne Moron) 0.5 MG tablet Take 0.5 mg by mouth 3 (three) times daily as needed for anxiety.   Yes [provider]  amLODipine (NORVASC) 5 MG tablet Take 1 tablet (5 mg total) by mouth daily. 05/09/21  Yes Lorretta Harp, MD  apixaban (ELIQUIS) 5 MG TABS tablet TAKE (1) TABLET TWICE DAILY. Patient taking differently: Take 5 mg by mouth 2 (two) times daily. 01/12/22  Yes Lorretta Harp, MD  atorvastatin (LIPITOR) 80 MG tablet TAKE 1 TABLET BY MOUTH AT BEDTIME. 09/04/21  Yes Lorretta Harp, MD  clopidogrel (PLAVIX) 75 MG tablet TAKE 1 TABLET BY MOUTH ONCE DAILY. 07/13/21  Yes Lorretta Harp, MD  diltiazem (CARDIZEM CD) 120 MG 24 hr capsule TAKE 1 CAPSULE DAILY. 02/05/22  Yes Lorretta Harp, MD  isosorbide mononitrate (IMDUR) 30 MG 24 hr tablet TAKE ONE TABLET BY MOUTH ONCE DAILY. 10/27/21  Yes Lorretta Harp, MD  metoprolol tartrate (LOPRESSOR) 25 MG tablet Take 1 tablet (25 mg total) by mouth 2 (two) times daily. 10/24/21   Yes Lorretta Harp, MD  pantoprazole (PROTONIX) 40 MG tablet Take 1 tablet (40 mg total) by mouth every other day. Patient taking differently: Take 40 mg by mouth daily as needed (acid reflux). 05/12/19  Yes Rehman, Mechele Dawley, MD    Inpatient Medications: Scheduled Meds:  amLODipine  5 mg Oral Daily   apixaban  5 mg Oral BID   atorvastatin  80 mg Oral QHS   clopidogrel  75 mg Oral Daily   diltiazem  120 mg Oral Daily   isosorbide mononitrate  30 mg Oral Daily   metoprolol tartrate  25 mg Oral BID   pantoprazole  40 mg Oral Daily   Continuous Infusions:  PRN Meds: acetaminophen **OR** acetaminophen, ALPRAZolam, nitroGLYCERIN, ondansetron **OR** ondansetron (ZOFRAN) IV, polyethylene glycol  Allergies:    Allergies  Allergen Reactions   Lisinopril Swelling    H/o of angioedema    Chlorthalidone Other (See Comments)    Causes severe hyponatremia   Flexeril [Cyclobenzaprine]     Didn't feel right   Hyoscyamine Swelling   Prevacid [Lansoprazole] Swelling   Valium [  Diazepam] Other (See Comments)    "makes me feel crazy"   Potassium-Containing Compounds Palpitations    Social History:   Social History   Socioeconomic History   Marital status: Married    Spouse name: Not on file   Number of children: 1   Years of education: Not on file   Highest education level: Not on file  Occupational History   Not on file  Tobacco Use   Smoking status: Never   Smokeless tobacco: Never  Substance and Sexual Activity   Alcohol use: No   Drug use: No   Sexual activity: Not on file  Other Topics Concern   Not on file  Social History Narrative   ** Merged History Encounter **       Social Determinants of Health   Financial Resource Strain: Low Risk  (02/25/2018)   Overall Financial Resource Strain (CARDIA)    Difficulty of Paying Living Expenses: Not hard at all  Food Insecurity: No Food Insecurity (03/29/2022)   Hunger Vital Sign    Worried About Running Out of Food in the  Last Year: Never true    Ran Out of Food in the Last Year: Never true  Transportation Needs: No Transportation Needs (03/29/2022)   PRAPARE - Hydrologist (Medical): No    Lack of Transportation (Non-Medical): No  Physical Activity: Insufficiently Active (02/25/2018)   Exercise Vital Sign    Days of Exercise per Week: 3 days    Minutes of Exercise per Session: 10 min  Stress: Stress Concern Present (02/25/2018)   [place]    Feeling of Stress : Rather much  Social Connections: Not on file  Intimate Partner Violence: Not At Risk (03/29/2022)   Humiliation, Afraid, Rape, and Kick questionnaire    Fear of Current or Ex-Partner: No    Emotionally Abused: No    Physically Abused: No    Sexually Abused: No    Family History:    Family History  Problem Relation Age of Onset   Hypertension Father    Hyperlipidemia Father    CAD Father        H/O CABG     ROS:  Please see the history of present illness.  Review of Systems  Respiratory:  Positive for shortness of breath.   Cardiovascular:  Positive for chest pain.    All other ROS reviewed and negative.     Physical Exam/Data:   Vitals:   03/29/22 2300 03/29/22 2339 03/30/22 0439 03/30/22 0700  BP: 115/62 (!) 154/76 134/65 128/65  Pulse: (!) 53 65 (!) 57 (!) 57  Resp: '14  18 18  '$ Temp:  97.8 F (36.6 C) 98.7 F (37.1 C) 97.9 F (36.6 C)  TempSrc:  Oral    SpO2: 97% 100% 96% 98%  Weight:  62.2 kg    Height:  '5\' 8"'$  (1.727 m)     No intake or output data in the 24 hours ending 03/30/22 0755    03/29/2022   11:39 PM 03/29/2022    3:47 PM 09/13/2021    9:21 AM  Last 3 Weights  Weight (lbs) 137 lb 3.2 oz 142 lb 147 lb 6 oz  Weight (kg) 62.234 kg 64.411 kg 66.849 kg     Body mass index is 20.86 kg/m.  General:  Well nourished, well developed, in no acute distress, sitting upright in bed HEENT: normal Neck: no JVD Vascular:  No carotid bruits;  Distal pulses 2+ bilaterally Cardiac:  normal S1, S2; RRR; no murmur  Lungs:  clear to auscultation bilaterally, no wheezing, rhonchi or rales, respirations are unlabored on room air Abd: soft, nontender, no hepatomegaly  Ext: no edema Musculoskeletal:  No deformities, BUE and BLE strength normal and equal Skin: warm and dry  Neuro:  CNs 2-12 intact, no focal abnormalities noted Psych:  Normal affect   EKG:  The EKG was personally reviewed and demonstrates:  sinus rate of 66, LVH, and chronic RBBB Telemetry:  Telemetry was personally reviewed and demonstrates:  sinus brady rate of 50 to sinus rate of 65, chronic RBBB and LVH  Relevant CV Studies: Stress 05/2019 Nuclear stress EF: 68%. The left ventricular ejection fraction is hyperdynamic (>65%). There was no ST segment deviation noted during stress. There is a small defect of mild severity present in the basal inferior location. The defect is non-reversible and consistent with diaphragmatic attenuation artifact. No ischemia. This is a low risk study.  TTE 02/2018 Left ventricle: The cavity size was normal. Systolic function was    normal. The estimated ejection fraction was in the range of 55%    to 60%. Hypokinesis of the basal-midinferolateral myocardium;    consistent with ischemia or infarction in the distribution of the    right coronary or left circumflex coronary artery. Left    ventricular diastolic function parameters were normal.  - Aortic valve: Valve area (Vmax): 2.45 cm^2.  - Mitral valve: There was mild regurgitation.   LHC 02/2018 Mid RCA lesion is 95% stenosed. A stent was successfully placed. Post intervention, there is a 0% residual stenosis. Post Atrio lesion is 100% stenosed. The left ventricular systolic function is normal. LV end diastolic pressure is normal. The left ventricular ejection fraction is 55-65% by visual estimate.    Laboratory Data:  High Sensitivity Troponin:   Recent  Labs  Lab 03/29/22 1625 03/29/22 1835  TROPONINIHS 5 5     Chemistry Recent Labs  Lab 03/29/22 1625  NA 134*  K 3.7  CL 100  CO2 26  GLUCOSE 152*  BUN 17  CREATININE 1.04  CALCIUM 8.9  GFRNONAA >60  ANIONGAP 8    No results for input(s): "PROT", "ALBUMIN", "AST", "ALT", "ALKPHOS", "BILITOT" in the last 168 hours. Lipids No results for input(s): "CHOL", "TRIG", "HDL", "LABVLDL", "LDLCALC", "CHOLHDL" in the last 168 hours.  Hematology Recent Labs  Lab 03/29/22 1625  WBC 6.6  RBC 4.38  HGB 13.2  HCT 40.5  MCV 92.5  MCH 30.1  MCHC 32.6  RDW 13.0  PLT 251   Thyroid No results for input(s): "TSH", "FREET4" in the last 168 hours.  BNPNo results for input(s): "BNP", "PROBNP" in the last 168 hours.  DDimer No results for input(s): "DDIMER" in the last 168 hours.   Radiology/Studies:  DG Chest 2 View  Result Date: 03/29/2022 CLINICAL DATA:  Chest pain. EXAM: CHEST - 2 VIEW COMPARISON:  October 09, 2021. FINDINGS: The heart size and mediastinal contours are within normal limits. Both lungs are clear. The visualized skeletal structures are unremarkable. IMPRESSION: No active cardiopulmonary disease. Electronically Signed   By: Marijo Conception M.D.   On: 03/29/2022 16:23     Assessment and Plan:   Atypical chest pain with longstanding history of coronary artery disease with PCI/DES to the RCA in 2019 -Last heart catheterization with stent placement was in 02/2018 -Stress testing completed in 2021 with no ischemia considered low risk scan -High-sensitivity troponins trended negative  x 2 -No ischemic changes noted to EKG -He is continued on apixaban in lieu of aspirin, clopidogrel, atorvastatin, Imdur, and as needed Nitrostat -Continue cardiac monitoring -EKG as needed for pain or changes -Patient has remained n.p.o. since midnight -Will order stress testing for today  Paroxysmal atrial fibrillation -Currently sinus and sinus bradycardia on telemetry monitoring -He is  continued on apixaban 5 mg twice daily for CHA2DS2-VASc score of at least -Continue with cardiac monitoring -Continue with diltiazem 120 mg a daily for rate control and metoprolol titrate 25 mg twice daily  Essential hypertension -Blood pressure 128/65 -Continued on amlodipine, diltiazem, isosorbide, and metoprolol -Vital signs per unit protocol  Longstanding history of gastroesophageal reflux disease -Epigastric discomfort -Improvement with PPI -Continued on Protonix 40 mg daily  Shared Decision Making/Informed Consent The risks [chest pain, shortness of breath, cardiac arrhythmias, dizziness, blood pressure fluctuations, myocardial infarction, stroke/transient ischemic attack, nausea, vomiting, allergic reaction, radiation exposure, metallic taste sensation and life-threatening complications (estimated to be 1 in 10,000)], benefits (risk stratification, diagnosing coronary artery disease, treatment guidance) and alternatives of a nuclear stress test were discussed in detail with Mr. Macrae and he agrees to proceed.    Risk Assessment/Risk Scores:  {     For questions or updates, please contact [place] Please consult [email or web address] for contact info under    Signed, Advik Weatherspoon, NP  03/30/2022 7:55 AM

## 2022-04-03 DIAGNOSIS — D6869 Other thrombophilia: Secondary | ICD-10-CM | POA: Diagnosis not present

## 2022-04-03 DIAGNOSIS — I700 Atherosclerosis of aorta: Secondary | ICD-10-CM | POA: Diagnosis not present

## 2022-04-03 DIAGNOSIS — Z299 Encounter for prophylactic measures, unspecified: Secondary | ICD-10-CM | POA: Diagnosis not present

## 2022-04-03 DIAGNOSIS — I10 Essential (primary) hypertension: Secondary | ICD-10-CM | POA: Diagnosis not present

## 2022-04-03 DIAGNOSIS — M79674 Pain in right toe(s): Secondary | ICD-10-CM | POA: Diagnosis not present

## 2022-04-03 DIAGNOSIS — M109 Gout, unspecified: Secondary | ICD-10-CM | POA: Diagnosis not present

## 2022-04-03 DIAGNOSIS — R079 Chest pain, unspecified: Secondary | ICD-10-CM | POA: Diagnosis not present

## 2022-04-03 DIAGNOSIS — Z09 Encounter for follow-up examination after completed treatment for conditions other than malignant neoplasm: Secondary | ICD-10-CM | POA: Diagnosis not present

## 2022-04-09 ENCOUNTER — Telehealth (INDEPENDENT_AMBULATORY_CARE_PROVIDER_SITE_OTHER): Payer: Self-pay | Admitting: *Deleted

## 2022-04-09 ENCOUNTER — Ambulatory Visit (INDEPENDENT_AMBULATORY_CARE_PROVIDER_SITE_OTHER): Payer: Medicare Other | Admitting: Gastroenterology

## 2022-04-09 ENCOUNTER — Encounter (INDEPENDENT_AMBULATORY_CARE_PROVIDER_SITE_OTHER): Payer: Self-pay | Admitting: Gastroenterology

## 2022-04-09 VITALS — BP 170/78 | HR 50 | Temp 97.5°F | Ht 68.0 in | Wt 142.3 lb

## 2022-04-09 DIAGNOSIS — R1013 Epigastric pain: Secondary | ICD-10-CM | POA: Diagnosis not present

## 2022-04-09 DIAGNOSIS — K589 Irritable bowel syndrome without diarrhea: Secondary | ICD-10-CM

## 2022-04-09 NOTE — Telephone Encounter (Signed)
    04/09/22  [name] 11-09-1945  What type of surgery is being performed? EGD  When is surgery scheduled? TBD  What type of clearance is required (medical or pharmacy to hold medication or both? Medication  Are there any medications that need to be held prior to surgery and how long? Plavix x 5 days, Eliquis x 2 days  Name of physician performing surgery?  Dr. Maylon Peppers [facility] at [facility] Phone: [phone] Fax: [phone]  Anethesia type (none, local, MAC, general)? MAC

## 2022-04-09 NOTE — Progress Notes (Signed)
[name], M.D. Gastroenterology & Hepatology [facility] [address] [place], [place] [address]  Primary Care Physician: Glenda Chroman, MD [place] [address]  I will communicate my assessment and recommendations to the referring MD via EMR.  Problems: GERD Chronic epigastric pain, possibly related to functional dyspepsia  History of Present Illness: [name] is a 77 y.o. male with past medical history of GERD, anxiety, testicular cancer, atrial fibrillation, hyperlipidemia, hypertension, who presents for follow up of abdominal pain.  The patient was last seen on 12/27/2020 (Dr. Laural Golden). At that time, the patient was continued on pantoprazole every other day for management of GERD.  Based on previous notes from Dr. Laural Golden, he has presented intermittent episodes of abdominal pain for multiple years which were thought to be related to chronic dyspepsia/hypersensitivity.  Notably, the patient went to the ER on 03/29/2022 after he presented recurrent episodes of abdominal pain in his epigastric and lower retrosternal area for 1 month, which sometimes radiated to his back. He went to the ER on 03/29/2022 - had a TTE which showed normal EF 60 to 65%, no valvulopathy.  He did not have hepatic function or lipase checked at that time.  The patient reports that he was taking Protonix 40 mg as needed in the past. He states that he restarted Protonix 40 mg after he went to the hospital. He does not know if he can take it twice a day. He has been taking the Protonix 40 mg in the AM at least 30 minutes before breakfast. States that sometimes he may have some mild pain even if he takes the medicine compliantly.  The patient denies having any nausea, vomiting, fever, chills, hematochezia, melena, hematemesis, abdominal distention, diarrhea, jaundice, pruritus or weight loss.  Last EGD: 2017 Nonerosive gastritis, biopsies negative for H.  pylori or dysplasia.  Normal duodenum and esophagus.  Last Colonoscopy:06/28/2021 There was presence of a small polyp in the splenic flexure, 4 polyps in the rectum, transverse colon and a 12 mm serrated polyp in the hepatic flexure.  All polyps were removed, the largest polyp was removed with a hot snare, tattoo was placed next to this polypectomy..  Scar was noted in the mid sigmoid from previous polypectomy.  A. COLON, SPLENIC FLEXURE, HEPATIC FLEXURE, RECTUM, CECAL, TRANSVERSE,  POLYPECTOMY:  Tubular adenomas, a sessile serrated polyp without cytologic dysplasia,  hyperplastic polyp and a benign hamartomatous polyp with.  Negative for high-grade dysplasia.   B. COLON, HEPATIC FLEXURE, POLYPECTOMY:  Inflammatory pseudopolyp (polypoid granulation tissue with ulceration of  the overlying epithelium) with an associated hyperplastic polyp.  Negative for dysplasia and malignancy.   Recommended repeat colonoscopy in 5 years  Past Medical History: Past Medical History:  Diagnosis Date   Anxiety    Atrial fibrillation ([place])    Cancer ([place])    testicular    Chest pain    myoview 07/09/12-normal, nl ef; echo 04/12/05-ef>55%, mild aortic sclerosis   GERD (gastroesophageal reflux disease)    H. pylori infection    Headache    Hypercholesteremia    Hyperlipidemia    Hypertension    Hyponatremia 2013   Palpitations    event monior 04/11/05- SR with occ PAC   Personal history of colonic polyps 2 9 2007    Past Surgical History: Past Surgical History:  Procedure Laterality Date   BACK SURGERY     CATARACT EXTRACTION W/PHACO Left 12/08/2012   Procedure: CATARACT EXTRACTION PHACO AND  INTRAOCULAR LENS PLACEMENT (IOC);  Surgeon: Tonny Branch, MD;  Location: [facility];  Service: Ophthalmology;  Laterality: Left;  CDE:  23.19   CATARACT EXTRACTION W/PHACO Right 04/15/2014   Procedure: CATARACT EXTRACTION PHACO AND INTRAOCULAR LENS PLACEMENT RIGHT;  Surgeon: Tonny Branch, MD;  Location: [facility];   Service: Ophthalmology;  Laterality: Right;  CDE:10.32   COLONOSCOPY  12/06/2010   Procedure: COLONOSCOPY;  Surgeon: Rogene Houston, MD;  [facility];  Service: Endoscopy;  Laterality: N/A;   COLONOSCOPY N/A 05/23/2016   Procedure: COLONOSCOPY;  Surgeon: Rogene Houston, MD;  [facility];  Service: Endoscopy;  Laterality: N/A;  930   COLONOSCOPY WITH PROPOFOL N/A 06/28/2021   Procedure: COLONOSCOPY WITH PROPOFOL;  Surgeon: Rogene Houston, MD;  [facility];  Service: Endoscopy;  Laterality: N/A;  1045   CORONARY STENT INTERVENTION N/A 02/24/2018   Procedure: CORONARY STENT INTERVENTION;  Surgeon: Lorretta Harp, MD;  Location: [place] [facility];  Service: Cardiovascular;  Laterality: N/A;   ESOPHAGOGASTRODUODENOSCOPY N/A 07/20/2015   Procedure: ESOPHAGOGASTRODUODENOSCOPY (EGD);  Surgeon: Rogene Houston, MD;  [facility];  Service: Endoscopy;  Laterality: N/A;  2:55 - moved to 1:55 - Ann notified pt   EYE SURGERY     LEFT HEART CATH AND CORONARY ANGIOGRAPHY N/A 02/24/2018   Procedure: LEFT HEART CATH AND CORONARY ANGIOGRAPHY;  Surgeon: Lorretta Harp, MD;  Location: [place] [facility];  Service: Cardiovascular;  Laterality: N/A;   LUMBAR DISC SURGERY Left 2007   "L4-5 ruptured discs"   POLYPECTOMY  05/23/2016   Procedure: POLYPECTOMY;  Surgeon: Rogene Houston, MD;  [facility];  Service: Endoscopy;;  colon   POLYPECTOMY  06/28/2021   Procedure: POLYPECTOMY;  Surgeon: Rogene Houston, MD;  [facility];  Service: Endoscopy;;  splenic flexure;hepatic flexurex2; cecal polyp;transverse colon polyp;rectal polyp;    Family History: Family History  Problem Relation Age of Onset   Hypertension Father    Hyperlipidemia Father    CAD Father        H/O CABG    Social History: Social History   Tobacco Use  Smoking Status Never  Smokeless Tobacco Never   Social History   Substance and Sexual Activity  Alcohol Use No    Social History   Substance and Sexual Activity  Drug Use No    Allergies: Allergies  Allergen Reactions   Lisinopril Swelling    H/o of angioedema    Chlorthalidone Other (See Comments)    Causes severe hyponatremia   Flexeril [Cyclobenzaprine]     Didn't feel right   Hyoscyamine Swelling   Prevacid [Lansoprazole] Swelling   Valium [Diazepam] Other (See Comments)    "makes me feel crazy"   Potassium-Containing Compounds Palpitations    Medications: Current Outpatient Medications  Medication Sig Dispense Refill   acetaminophen (TYLENOL) 500 MG tablet Take 250 mg by mouth 2 (two) times daily as needed for moderate pain or headache.     ALPRAZolam (XANAX) 0.5 MG tablet Take 0.5 mg by mouth 3 (three) times daily as needed for anxiety.     amLODipine (NORVASC) 5 MG tablet Take 1 tablet (5 mg total) by mouth daily. 90 tablet 3   apixaban (ELIQUIS) 5 MG TABS tablet TAKE (1) TABLET TWICE DAILY. (Patient taking differently: Take 5 mg by mouth 2 (two) times daily.) 60 tablet 5   atorvastatin (LIPITOR) 80 MG tablet TAKE 1 TABLET BY MOUTH AT BEDTIME. [place]  tablet 0   clopidogrel (PLAVIX) 75 MG tablet TAKE 1 TABLET BY MOUTH ONCE DAILY. 90 tablet 3   diltiazem (CARDIZEM CD) 120 MG 24 hr capsule TAKE 1 CAPSULE DAILY. 90 capsule 1   isosorbide mononitrate (IMDUR) 30 MG 24 hr tablet TAKE ONE TABLET BY MOUTH ONCE DAILY. 90 tablet 3   metoprolol tartrate (LOPRESSOR) 25 MG tablet Take 1 tablet (25 mg total) by mouth 2 (two) times daily. 180 tablet 3   pantoprazole (PROTONIX) 40 MG tablet Take 1 tablet (40 mg total) by mouth every other day. (Patient taking differently: Take 40 mg by mouth daily as needed (acid reflux).) 45 tablet 3   No current facility-administered medications for this visit.    Review of Systems: GENERAL: negative for malaise, night sweats HEENT: No changes in hearing or vision, no nose bleeds or other nasal problems. NECK: Negative for lumps, goiter, pain and significant  neck swelling RESPIRATORY: Negative for cough, wheezing CARDIOVASCULAR: Negative for chest pain, leg swelling, palpitations, orthopnea GI: SEE HPI MUSCULOSKELETAL: Negative for joint pain or swelling, back pain, and muscle pain. SKIN: Negative for lesions, rash PSYCH: Negative for sleep disturbance, mood disorder and recent psychosocial stressors. HEMATOLOGY Negative for prolonged bleeding, bruising easily, and swollen nodes. ENDOCRINE: Negative for cold or heat intolerance, polyuria, polydipsia and goiter. NEURO: negative for tremor, gait imbalance, syncope and seizures. The remainder of the review of systems is noncontributory.   Physical Exam: BP (!) 170/78 (BP Location: Left Arm, Patient Position: Sitting, Cuff Size: Small)   Pulse (!) 50   Temp (!) 97.5 F (36.4 C) (Temporal)   Ht '5\' 8"'$  (1.727 m)   Wt 142 lb 4.8 oz (64.5 kg)   BMI 21.64 kg/m  GENERAL: The patient is AO x3, in no acute distress. HEENT: Head is normocephalic and atraumatic. EOMI are intact. Mouth is well hydrated and without lesions. NECK: Supple. No masses LUNGS: Clear to auscultation. No presence of rhonchi/wheezing/rales. Adequate chest expansion HEART: RRR, normal s1 and s2. ABDOMEN: mildly tender in the epigastric area and xyphoid process area, no guarding, no peritoneal signs, and nondistended. BS +. No masses. EXTREMITIES: Without any cyanosis, clubbing, rash, lesions or edema. NEUROLOGIC: AOx3, no focal motor deficit. SKIN: no jaundice, no rashes  Imaging/Labs: as above  I personally reviewed and interpreted the available labs, imaging and endoscopic files.  Impression and Plan: CHIEF WALKUP is a 76 y.o. male with past medical history of GERD, anxiety, testicular cancer, atrial fibrillation, hyperlipidemia, hypertension, who presents for follow up of abdominal pain.  The patient has presented recurrent abdominal pain that has mildly improved with the use of PPI.  He had some cardiac workup recently  that was negative for any significant alterations concerning for cardiac ischemia.  Despite the fact he has improved some with the use of PPI, advised to evaluate him symptoms further with an EGD given that his last procedure was performed in 2017.  We discussed that this episode could be similar to his previous episodes of epigastric pain which were not related to functional dyspepsia.  He will benefit from taking Pepcid at night as well.  Ultimately, if his EGD is unremarkable, we will check a CMP and lipase to evaluate pancreatic etiologies.  The patient was found to have elevated blood pressure when vital signs were checked in the office. The blood pressure was rechecked by the nursing staff and it was found be persistently elevated >140/90 mmHg. I personally advised to the patient to follow up closely  with PCP for hypertension control.  - Schedule EGD -Continue pantoprazole 40 mg in AM -Start Pepcid 20 mg at night. - If negative EGD will check CMP and lipase  All questions were answered.      Miguel Peppers, MD Gastroenterology and Hepatology [facility]

## 2022-04-09 NOTE — Patient Instructions (Signed)
Schedule EGD Continue pantoprazole 40 mg in AM Start Pepcid 20 mg at night. The patient was found to have elevated blood pressure when vital signs were checked in the office. The blood pressure was rechecked by the nursing staff and it was found be persistently elevated >140/90 mmHg. I personally advised to the patient to follow up closely with PCP for hypertension control.

## 2022-04-09 NOTE — H&P (View-Only) (Signed)
Maylon Peppers, M.D. Gastroenterology & Hepatology [facility] [address] [place], [place] [address]  Primary Care Physician: Glenda Chroman, MD [place] [id number]  I will communicate my assessment and recommendations to the referring MD via EMR.  Problems: GERD Chronic epigastric pain, possibly related to functional dyspepsia  History of Present Illness: [name] is a 77 y.o. male with past medical history of GERD, anxiety, testicular cancer, atrial fibrillation, hyperlipidemia, hypertension, who presents for follow up of abdominal pain.  The patient was last seen on 12/27/2020 (Dr. Laural Golden). At that time, the patient was continued on pantoprazole every other day for management of GERD.  Based on previous notes from Dr. Laural Golden, he has presented intermittent episodes of abdominal pain for multiple years which were thought to be related to chronic dyspepsia/hypersensitivity.  Notably, the patient went to the ER on 03/29/2022 after he presented recurrent episodes of abdominal pain in his epigastric and lower retrosternal area for 1 month, which sometimes radiated to his back. He went to the ER on 03/29/2022 - had a TTE which showed normal EF 60 to 65%, no valvulopathy.  He did not have hepatic function or lipase checked at that time.  The patient reports that he was taking Protonix 40 mg as needed in the past. He states that he restarted Protonix 40 mg after he went to the hospital. He does not know if he can take it twice a day. He has been taking the Protonix 40 mg in the AM at least 30 minutes before breakfast. States that sometimes he may have some mild pain even if he takes the medicine compliantly.  The patient denies having any nausea, vomiting, fever, chills, hematochezia, melena, hematemesis, abdominal distention, diarrhea, jaundice, pruritus or weight loss.  Last EGD: 2017 Nonerosive gastritis, biopsies negative for H.  pylori or dysplasia.  Normal duodenum and esophagus.  Last Colonoscopy:06/28/2021 There was presence of a small polyp in the splenic flexure, 4 polyps in the rectum, transverse colon and a 12 mm serrated polyp in the hepatic flexure.  All polyps were removed, the largest polyp was removed with a hot snare, tattoo was placed next to this polypectomy..  Scar was noted in the mid sigmoid from previous polypectomy.  A. COLON, SPLENIC FLEXURE, HEPATIC FLEXURE, RECTUM, CECAL, TRANSVERSE,  POLYPECTOMY:  Tubular adenomas, a sessile serrated polyp without cytologic dysplasia,  hyperplastic polyp and a benign hamartomatous polyp with.  Negative for high-grade dysplasia.   B. COLON, HEPATIC FLEXURE, POLYPECTOMY:  Inflammatory pseudopolyp (polypoid granulation tissue with ulceration of  the overlying epithelium) with an associated hyperplastic polyp.  Negative for dysplasia and malignancy.   Recommended repeat colonoscopy in 5 years  Past Medical History: Past Medical History:  Diagnosis Date   Anxiety    Atrial fibrillation ([place])    Cancer ([place])    testicular    Chest pain    myoview 07/09/12-normal, nl ef; echo 04/12/05-ef>55%, mild aortic sclerosis   GERD (gastroesophageal reflux disease)    H. pylori infection    Headache    Hypercholesteremia    Hyperlipidemia    Hypertension    Hyponatremia 2013   Palpitations    event monior 04/11/05- SR with occ PAC   Personal history of colonic polyps 2 9 2007    Past Surgical History: Past Surgical History:  Procedure Laterality Date   BACK SURGERY     CATARACT EXTRACTION W/PHACO Left 12/08/2012   Procedure: CATARACT EXTRACTION PHACO AND  INTRAOCULAR LENS PLACEMENT (IOC);  Surgeon: Tonny Branch, MD;  Location: [facility];  Service: Ophthalmology;  Laterality: Left;  CDE:  23.19   CATARACT EXTRACTION W/PHACO Right 04/15/2014   Procedure: CATARACT EXTRACTION PHACO AND INTRAOCULAR LENS PLACEMENT RIGHT;  Surgeon: Tonny Branch, MD;  Location: [facility];   Service: Ophthalmology;  Laterality: Right;  CDE:10.32   COLONOSCOPY  12/06/2010   Procedure: COLONOSCOPY;  Surgeon: Rogene Houston, MD;  [facility];  Service: Endoscopy;  Laterality: N/A;   COLONOSCOPY N/A 05/23/2016   Procedure: COLONOSCOPY;  Surgeon: Rogene Houston, MD;  [facility];  Service: Endoscopy;  Laterality: N/A;  930   COLONOSCOPY WITH PROPOFOL N/A 06/28/2021   Procedure: COLONOSCOPY WITH PROPOFOL;  Surgeon: Rogene Houston, MD;  [facility];  Service: Endoscopy;  Laterality: N/A;  1045   CORONARY STENT INTERVENTION N/A 02/24/2018   Procedure: CORONARY STENT INTERVENTION;  Surgeon: Lorretta Harp, MD;  Location: [place] [facility];  Service: Cardiovascular;  Laterality: N/A;   ESOPHAGOGASTRODUODENOSCOPY N/A 07/20/2015   Procedure: ESOPHAGOGASTRODUODENOSCOPY (EGD);  Surgeon: Rogene Houston, MD;  [facility];  Service: Endoscopy;  Laterality: N/A;  2:55 - moved to 1:55 - Ann notified pt   EYE SURGERY     LEFT HEART CATH AND CORONARY ANGIOGRAPHY N/A 02/24/2018   Procedure: LEFT HEART CATH AND CORONARY ANGIOGRAPHY;  Surgeon: Lorretta Harp, MD;  Location: [place] [facility];  Service: Cardiovascular;  Laterality: N/A;   LUMBAR DISC SURGERY Left 2007   "L4-5 ruptured discs"   POLYPECTOMY  05/23/2016   Procedure: POLYPECTOMY;  Surgeon: Rogene Houston, MD;  [facility];  Service: Endoscopy;;  colon   POLYPECTOMY  06/28/2021   Procedure: POLYPECTOMY;  Surgeon: Rogene Houston, MD;  [facility];  Service: Endoscopy;;  splenic flexure;hepatic flexurex2; cecal polyp;transverse colon polyp;rectal polyp;    Family History: Family History  Problem Relation Age of Onset   Hypertension Father    Hyperlipidemia Father    CAD Father        H/O CABG    Social History: Social History   Tobacco Use  Smoking Status Never  Smokeless Tobacco Never   Social History   Substance and Sexual Activity  Alcohol Use No    Social History   Substance and Sexual Activity  Drug Use No    Allergies: Allergies  Allergen Reactions   Lisinopril Swelling    H/o of angioedema    Chlorthalidone Other (See Comments)    Causes severe hyponatremia   Flexeril [Cyclobenzaprine]     Didn't feel right   Hyoscyamine Swelling   Prevacid [Lansoprazole] Swelling   Valium [Diazepam] Other (See Comments)    "makes me feel crazy"   Potassium-Containing Compounds Palpitations    Medications: Current Outpatient Medications  Medication Sig Dispense Refill   acetaminophen (TYLENOL) 500 MG tablet Take 250 mg by mouth 2 (two) times daily as needed for moderate pain or headache.     ALPRAZolam (XANAX) 0.5 MG tablet Take 0.5 mg by mouth 3 (three) times daily as needed for anxiety.     amLODipine (NORVASC) 5 MG tablet Take 1 tablet (5 mg total) by mouth daily. 90 tablet 3   apixaban (ELIQUIS) 5 MG TABS tablet TAKE (1) TABLET TWICE DAILY. (Patient taking differently: Take 5 mg by mouth 2 (two) times daily.) 60 tablet 5   atorvastatin (LIPITOR) 80 MG tablet TAKE 1 TABLET BY MOUTH AT BEDTIME. [place]  tablet 0   clopidogrel (PLAVIX) 75 MG tablet TAKE 1 TABLET BY MOUTH ONCE DAILY. 90 tablet 3   diltiazem (CARDIZEM CD) 120 MG 24 hr capsule TAKE 1 CAPSULE DAILY. 90 capsule 1   isosorbide mononitrate (IMDUR) 30 MG 24 hr tablet TAKE ONE TABLET BY MOUTH ONCE DAILY. 90 tablet 3   metoprolol tartrate (LOPRESSOR) 25 MG tablet Take 1 tablet (25 mg total) by mouth 2 (two) times daily. 180 tablet 3   pantoprazole (PROTONIX) 40 MG tablet Take 1 tablet (40 mg total) by mouth every other day. (Patient taking differently: Take 40 mg by mouth daily as needed (acid reflux).) 45 tablet 3   No current facility-administered medications for this visit.    Review of Systems: GENERAL: negative for malaise, night sweats HEENT: No changes in hearing or vision, no nose bleeds or other nasal problems. NECK: Negative for lumps, goiter, pain and significant  neck swelling RESPIRATORY: Negative for cough, wheezing CARDIOVASCULAR: Negative for chest pain, leg swelling, palpitations, orthopnea GI: SEE HPI MUSCULOSKELETAL: Negative for joint pain or swelling, back pain, and muscle pain. SKIN: Negative for lesions, rash PSYCH: Negative for sleep disturbance, mood disorder and recent psychosocial stressors. HEMATOLOGY Negative for prolonged bleeding, bruising easily, and swollen nodes. ENDOCRINE: Negative for cold or heat intolerance, polyuria, polydipsia and goiter. NEURO: negative for tremor, gait imbalance, syncope and seizures. The remainder of the review of systems is noncontributory.   Physical Exam: BP (!) 170/78 (BP Location: Left Arm, Patient Position: Sitting, Cuff Size: Small)   Pulse (!) 50   Temp (!) 97.5 F (36.4 C) (Temporal)   Ht '5\' 8"'$  (1.727 m)   Wt 142 lb 4.8 oz (64.5 kg)   BMI 21.64 kg/m  GENERAL: The patient is AO x3, in no acute distress. HEENT: Head is normocephalic and atraumatic. EOMI are intact. Mouth is well hydrated and without lesions. NECK: Supple. No masses LUNGS: Clear to auscultation. No presence of rhonchi/wheezing/rales. Adequate chest expansion HEART: RRR, normal s1 and s2. ABDOMEN: mildly tender in the epigastric area and xyphoid process area, no guarding, no peritoneal signs, and nondistended. BS +. No masses. EXTREMITIES: Without any cyanosis, clubbing, rash, lesions or edema. NEUROLOGIC: AOx3, no focal motor deficit. SKIN: no jaundice, no rashes  Imaging/Labs: as above  I personally reviewed and interpreted the available labs, imaging and endoscopic files.  Impression and Plan: [name] is a 77 y.o. male with past medical history of GERD, anxiety, testicular cancer, atrial fibrillation, hyperlipidemia, hypertension, who presents for follow up of abdominal pain.  The patient has presented recurrent abdominal pain that has mildly improved with the use of PPI.  He had some cardiac workup recently  that was negative for any significant alterations concerning for cardiac ischemia.  Despite the fact he has improved some with the use of PPI, advised to evaluate him symptoms further with an EGD given that his last procedure was performed in 2017.  We discussed that this episode could be similar to his previous episodes of epigastric pain which were not related to functional dyspepsia.  He will benefit from taking Pepcid at night as well.  Ultimately, if his EGD is unremarkable, we will check a CMP and lipase to evaluate pancreatic etiologies.  The patient was found to have elevated blood pressure when vital signs were checked in the office. The blood pressure was rechecked by the nursing staff and it was found be persistently elevated >140/90 mmHg. I personally advised to the patient to follow up closely  with PCP for hypertension control.  - Schedule EGD -Continue pantoprazole 40 mg in AM -Start Pepcid 20 mg at night. - If negative EGD will check CMP and lipase  All questions were answered.      Maylon Peppers, MD Gastroenterology and Hepatology [facility]

## 2022-04-10 ENCOUNTER — Telehealth: Payer: Self-pay | Admitting: *Deleted

## 2022-04-10 NOTE — Telephone Encounter (Signed)
I s/w the pt and his wife and they are agreeable to plan of care for tele pre op add on due to med hold and procedure date.04/11/22 @ 3:20. Med rec and consent are done.      Patient Consent for Virtual Visit        [name] has provided verbal consent on 04/10/2022 for a virtual visit (video or telephone).   CONSENT FOR VIRTUAL VISIT FOR:  [name]  By participating in this virtual visit I agree to the following:  I hereby voluntarily request, consent and authorize [facility] and its employed or contracted physicians, physician assistants, nurse practitioners or other licensed health care professionals (the Practitioner), to provide me with telemedicine health care services (the "Services") as deemed necessary by the treating Practitioner. I acknowledge and consent to receive the Services by the Practitioner via telemedicine. I understand that the telemedicine visit will involve communicating with the Practitioner through live audiovisual communication technology and the disclosure of certain medical information by electronic transmission. I acknowledge that I have been given the opportunity to request an in-person assessment or other available alternative prior to the telemedicine visit and am voluntarily participating in the telemedicine visit.  I understand that I have the right to withhold or withdraw my consent to the use of telemedicine in the course of my care at any time, without affecting my right to future care or treatment, and that the Practitioner or I may terminate the telemedicine visit at any time. I understand that I have the right to inspect all information obtained and/or recorded in the course of the telemedicine visit and may receive copies of available information for a reasonable fee.  I understand that some of the potential risks of receiving the Services via telemedicine include:  Delay or interruption in medical evaluation due to technological equipment  failure or disruption; Information transmitted may not be sufficient (e.g. poor resolution of images) to allow for appropriate medical decision making by the Practitioner; and/or  In rare instances, security protocols could fail, causing a breach of personal health information.  Furthermore, I acknowledge that it is my responsibility to provide information about my medical history, conditions and care that is complete and accurate to the best of my ability. I acknowledge that Practitioner's advice, recommendations, and/or decision may be based on factors not within their control, such as incomplete or inaccurate data provided by me or distortions of diagnostic images or specimens that may result from electronic transmissions. I understand that the practice of medicine is not an exact science and that Practitioner makes no warranties or guarantees regarding treatment outcomes. I acknowledge that a copy of this consent can be made available to me via my patient portal ([facility]), or I can request a printed copy by calling the office of [facility].    I understand that my insurance will be billed for this visit.   I have read or had this consent read to me. I understand the contents of this consent, which adequately explains the benefits and risks of the Services being provided via telemedicine.  I have been provided ample opportunity to ask questions regarding this consent and the Services and have had my questions answered to my satisfaction. I give my informed consent for the services to be provided through the use of telemedicine in my medical care

## 2022-04-10 NOTE — Telephone Encounter (Signed)
Called spouse, LMOVM to advise of pre-op appt details for procedure

## 2022-04-10 NOTE — Telephone Encounter (Signed)
I s/w the pt and his wife and they are agreeable to plan of care for tele pre op add on due to med hold and procedure date.04/11/22 @ 3:20. Med rec and consent are done.

## 2022-04-10 NOTE — Telephone Encounter (Signed)
Spoke spouse. Pt scheduled for EGD 1/30 at [facility] will need to hold plavix x 5 days and eliquis x 2 days. She voiced understanding. Advised will call back with pre-op appt.

## 2022-04-10 NOTE — Telephone Encounter (Signed)
Pt also needs clearance for the plavix x 5 days

## 2022-04-10 NOTE — Telephone Encounter (Signed)
Patient with diagnosis of afib on Eliquis for anticoagulation.    Procedure: EGD Date of procedure: TBD  CHA2DS2-VASc Score = 4  This indicates a 4.8% annual risk of stroke. The patient's score is based upon: CHF History: 0 HTN History: 1 Diabetes History: 0 Stroke History: 0 Vascular Disease History: 1 Age Score: 2 Gender Score: 0   CrCl 32m/min Platelet count 251K  Per office protocol, patient can hold Eliquis for 2 days prior to procedure as requested.  **This guidance is not considered finalized until pre-operative APP has relayed final recommendations.**

## 2022-04-10 NOTE — Telephone Encounter (Signed)
   Name: [name]  DOB: Jun 26, 1945  MRN: [id number]  Primary Cardiologist: Quay Burow, MD   Preoperative team, please contact this patient and set up a phone call appointment for further preoperative risk assessment. Please obtain consent and complete medication review. Thank you for your help.  I confirm that guidance regarding antiplatelet and oral anticoagulation therapy has been completed and, if necessary, noted below.  Per office protocol, patient can hold Eliquis for 2 days prior to procedure as requested. Additionally, he may hold Plavix for 5 days prior to procedure (pt was cleared to hold Plavix for prior procedure in 05/2021). Please resume Eliquis and Plavix as soon as possible postprocedure, at the discretion of the surgeon.    Lenna Sciara, NP 04/10/2022, 11:09 AM [place]

## 2022-04-11 ENCOUNTER — Ambulatory Visit: Payer: Medicare Other | Attending: Cardiovascular Disease | Admitting: Nurse Practitioner

## 2022-04-11 ENCOUNTER — Encounter: Payer: Self-pay | Admitting: Nurse Practitioner

## 2022-04-11 DIAGNOSIS — Z01810 Encounter for preprocedural cardiovascular examination: Secondary | ICD-10-CM | POA: Diagnosis not present

## 2022-04-11 NOTE — Progress Notes (Signed)
Virtual Visit via Telephone Note   Because of [name]'s co-morbid illnesses, he is at least at moderate risk for complications without adequate follow up.  This format is felt to be most appropriate for this patient at this time.  The patient did not have access to video technology/had technical difficulties with video requiring transitioning to audio format only (telephone).  All issues noted in this document were discussed and addressed.  No physical exam could be performed with this format.  Please refer to the patient's chart for his consent to telehealth for [facility].  Evaluation Performed:  Preoperative cardiovascular risk assessment _____________   Date:  04/11/2022   Patient ID:  [name], [name] 1945-05-19, MRN [id number] Patient Location:  Home Provider location:   Office  Primary Care Provider:  Glenda Chroman, MD Primary Cardiologist:  Quay Burow, MD  Chief Complaint / Patient Profile   77 y.o. 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 Maylon Peppers of [facility] gastroenterology and presents today for telephonic preoperative cardiovascular risk assessment.  History of Present Illness    [name] is a 77 y.o. male who presents via audio/video conferencing for a telehealth visit today.  Pt was last seen in cardiology clinic on 08/08/2021 by Dr. [name].  At that time [name] was doing well.   The patient is now pending procedure as outlined above. Since his last visit, he stable overall from a cardiac standpoint.  He did have a recent ED visit in the setting of epigastric pain.  Troponin was negative, stress test was low risk.  He denies chest pain, palpitations, dyspnea, pnd, orthopnea, n, v, dizziness, syncope, edema, weight gain, or early satiety. All other systems reviewed and are otherwise  negative except as noted above.   Past Medical History    Past Medical History:  Diagnosis Date   Anxiety    Atrial fibrillation ([place])    Cancer ([place])    testicular    Chest pain    myoview 07/09/12-normal, nl ef; echo 04/12/05-ef>55%, mild aortic sclerosis   GERD (gastroesophageal reflux disease)    H. pylori infection    Headache    Hypercholesteremia    Hyperlipidemia    Hypertension    Hyponatremia 2013   Palpitations    event monior 04/11/05- SR with occ PAC   Personal history of colonic polyps 2 9 2007   Past Surgical History:  Procedure Laterality Date   BACK SURGERY     CATARACT EXTRACTION W/PHACO Left 12/08/2012   Procedure: CATARACT EXTRACTION PHACO AND INTRAOCULAR LENS PLACEMENT ([place]);  Surgeon: Tonny Branch, MD;  Location: [facility] OR[facility];  Service: Ophthalmology;  Laterality: Left;  CDE:  23.19   CATARACT EXTRACTION W/PHACO Right 04/15/2014   Procedure: CATARACT EXTRACTION PHACO AND INTRAOCULAR LENS PLACEMENT RIGHT;  Surgeon: Tonny Branch, MD;  Location: [facility];  Service: Ophthalmology;  Laterality: Right;  CDE:10.32   COLONOSCOPY  12/06/2010   Procedure: COLONOSCOPY;  Surgeon: Rogene Houston, MD;  Location: [facility];  Service: Endoscopy;  Laterality: N/A;   COLONOSCOPY N/A 05/23/2016   Procedure: COLONOSCOPY;  Surgeon: Rogene Houston, MD;  Location: [facility];  Service: Endoscopy;  Laterality: N/A;  930   COLONOSCOPY WITH PROPOFOL N/A 06/28/2021   Procedure: COLONOSCOPY WITH PROPOFOL;  Surgeon: Rogene Houston, MD;  Location: [facility];  Service: Endoscopy;  Laterality: N/A;  1045   CORONARY STENT INTERVENTION N/A 02/24/2018   Procedure: CORONARY STENT INTERVENTION;  Surgeon: Lorretta Harp, MD;  Location: [place] CV LAB;  Service: Cardiovascular;  Laterality: N/A;   ESOPHAGOGASTRODUODENOSCOPY N/A 07/20/2015   Procedure: ESOPHAGOGASTRODUODENOSCOPY (EGD);  Surgeon: Rogene Houston, MD;  [facility];  Service: Endoscopy;  Laterality: N/A;  2:55 - moved to  1:55 - Ann notified pt   EYE SURGERY     LEFT HEART CATH AND CORONARY ANGIOGRAPHY N/A 02/24/2018   Procedure: LEFT HEART CATH AND CORONARY ANGIOGRAPHY;  Surgeon: Lorretta Harp, MD;  Location: [place] CV LAB;  Service: Cardiovascular;  Laterality: N/A;   LUMBAR DISC SURGERY Left 2007   "L4-5 ruptured discs"   POLYPECTOMY  05/23/2016   Procedure: POLYPECTOMY;  Surgeon: Rogene Houston, MD;  [facility];  Service: Endoscopy;;  colon   POLYPECTOMY  06/28/2021   Procedure: POLYPECTOMY;  Surgeon: Rogene Houston, MD;  [facility];  Service: Endoscopy;;  splenic flexure;hepatic flexurex2; cecal polyp;transverse colon polyp;rectal polyp;    Allergies  Allergies  Allergen Reactions   Lisinopril Swelling    H/o of angioedema    Chlorthalidone Other (See Comments)    Causes severe hyponatremia   Flexeril [Cyclobenzaprine]     Didn't feel right   Hyoscyamine Swelling   Prevacid [Lansoprazole] Swelling   Valium [Diazepam] Other (See Comments)    "makes me feel crazy"   Potassium-Containing Compounds Palpitations    Home Medications    Prior to Admission medications   Medication Sig Start Date End Date Taking? Authorizing Provider  acetaminophen (TYLENOL) 500 MG tablet Take 250 mg by mouth 2 (two) times daily as needed for moderate pain or headache.    [provider]  ALPRAZolam Duanne Moron) 0.5 MG tablet Take 0.5 mg by mouth 3 (three) times daily as needed for anxiety.    [provider]  amLODipine (NORVASC) 5 MG tablet Take 1 tablet (5 mg total) by mouth daily. 05/09/21   Lorretta Harp, MD  apixaban (ELIQUIS) 5 MG TABS tablet TAKE (1) TABLET TWICE DAILY. Patient taking differently: Take 5 mg by mouth 2 (two) times daily. 01/12/22   Lorretta Harp, MD  atorvastatin (LIPITOR) 80 MG tablet TAKE 1 TABLET BY MOUTH AT BEDTIME. 09/04/21   Lorretta Harp, MD  clopidogrel (PLAVIX) 75 MG tablet TAKE 1 TABLET BY MOUTH ONCE DAILY. 07/13/21   Lorretta Harp, MD  diltiazem (CARDIZEM CD) 120 MG 24 hr capsule TAKE 1 CAPSULE DAILY. 02/05/22   Lorretta Harp, MD  isosorbide mononitrate (IMDUR) 30 MG 24 hr tablet TAKE ONE TABLET BY MOUTH ONCE DAILY. 10/27/21   Lorretta Harp, MD  metoprolol tartrate (LOPRESSOR) 25 MG tablet Take 1 tablet (25 mg total) by mouth 2 (two) times daily. 10/24/21   Lorretta Harp, MD  pantoprazole (PROTONIX) 40 MG tablet Take 1 tablet (40 mg total) by mouth every other day. Patient taking differently: Take 40 mg by mouth daily as needed (acid reflux). 05/12/19   Rogene Houston, MD    Physical Exam    Vital Signs:  [name] does not have vital signs available for review today.  Given telephonic nature of communication, physical exam is limited. AAOx3. NAD. Normal affect.  Speech and respirations are unlabored.  Accessory Clinical Findings    None  Assessment & Plan    1.  Preoperative Cardiovascular Risk Assessment:  According to the Revised Cardiac Risk Index (RCRI), his Perioperative Risk of Major Cardiac Event is (%): 0.9. His Functional Capacity in METs is: 7.01 according to the [place] Activity Status Index (DASI).Therefore, based on ACC/AHA guidelines, patient would be at acceptable risk for the planned procedure without further cardiovascular testing.   The patient was advised that if he develops new symptoms prior to surgery to contact our office to arrange for a follow-up visit, and he verbalized understanding.  Per office protocol, patient can hold Eliquis for 2 days prior to procedure as requested. Additionally, he may hold Plavix for 5 days prior to procedure (pt was cleared to hold Plavix for prior procedure in 05/2021). Please resume Eliquis and Plavix as soon as possible postprocedure, at the discretion of the surgeon.    A copy of this note will be routed to requesting surgeon.  Time:   Today, I have spent 8 minutes with the patient with telehealth technology discussing medical  history, symptoms, and management plan.     Lenna Sciara, NP  04/11/2022, 3:30 PM

## 2022-04-11 NOTE — Telephone Encounter (Signed)
Spoke with pt spouse. She is aware of pre-op appt. Discussed EGD instructions in details

## 2022-04-12 ENCOUNTER — Other Ambulatory Visit: Payer: Self-pay

## 2022-04-12 ENCOUNTER — Encounter (HOSPITAL_COMMUNITY)
Admission: RE | Admit: 2022-04-12 | Discharge: 2022-04-12 | Disposition: A | Discharge status: Home / Self Care | Payer: Medicare Other | Source: Ambulatory Visit | Attending: Gastroenterology | Admitting: Gastroenterology

## 2022-04-12 ENCOUNTER — Encounter (HOSPITAL_COMMUNITY): Payer: Self-pay

## 2022-04-12 ENCOUNTER — Ambulatory Visit: Payer: Medicare Other | Admitting: Medical

## 2022-04-12 NOTE — Pre-Procedure Instructions (Signed)
Attempted pre-op phone call. Left VM for him to call us back. 

## 2022-04-12 NOTE — Patient Instructions (Signed)
[name]  04/12/2022     '@PREFPERIOPPHARMACY'$ @   Your procedure is scheduled on  04/17/2022.   Report to [name] at  0700  A.M.   Call this number if you have problems the morning of surgery:  [phone]  If you experience any cold or flu symptoms such as cough, fever, chills, shortness of breath, etc. between now and your scheduled surgery, please notify us at the above number.   Remember:  Follow the diet instructions given to you by the office.        Your last dose of plavix should have been 04/11/2022.       Your last dose of eliquis should be on 04/14/2022.      Take these medicines the morning of surgery with A SIP OF WATER         xanax(if needed), amlodipine, diltiazem, isosorbide, metoprolol, pantoprazole.     Do not wear jewelry, make-up or nail polish.  Do not wear lotions, powders, or perfumes, or deodorant.  Do not shave 48 hours prior to surgery.  Men may shave face and neck.  Do not bring valuables to the hospital.  [facility] is not responsible for any belongings or valuables.  Contacts, dentures or bridgework may not be worn into surgery.  Leave your suitcase in the car.  After surgery it may be brought to your room.  For patients admitted to the hospital, discharge time will be determined by your treatment team.  Patients discharged the day of surgery will not be allowed to drive home and must have someone with them for 24 hours.    Special instructions:   DO NOT smoke tobacco or vape for 24 hours before your procedure.  Please read over the following fact sheets that you were given. Anesthesia Post-op Instructions and Care and Recovery After Surgery      Upper Endoscopy, Adult, Care After After the procedure, it is common to have a sore throat. It is also common to have: Mild stomach pain or discomfort. Bloating. Nausea. Follow these instructions at home: The instructions below may help you care for yourself at home. Your  health care provider may give you more instructions. If you have questions, ask your health care provider. If you were given a sedative during the procedure, it can affect you for several hours. Do not drive or operate machinery until your health care provider says that it is safe. If you will be going home right after the procedure, plan to have a responsible adult: Take you home from the hospital or clinic. You will not be allowed to drive. Care for you for the time you are told. Follow instructions from your health care provider about what you may eat and drink. Return to your normal activities as told by your health care provider. Ask your health care provider what activities are safe for you. Take over-the-counter and prescription medicines only as told by your health care provider. Contact a health care provider if you: Have a sore throat that lasts longer than one day. Have trouble swallowing. Have a fever. Get help right away if you: Vomit blood or your vomit looks like coffee grounds. Have bloody, black, or tarry stools. Have a very bad sore throat or you cannot swallow. Have difficulty breathing or very bad pain in your chest or abdomen. These symptoms may be an emergency. Get help right away. Call 911. Do not wait to see if the symptoms will  go away. Do not drive yourself to the hospital. Summary After the procedure, it is common to have a sore throat, mild stomach discomfort, bloating, and nausea. If you were given a sedative during the procedure, it can affect you for several hours. Do not drive until your health care provider says that it is safe. Follow instructions from your health care provider about what you may eat and drink. Return to your normal activities as told by your health care provider. This information is not intended to replace advice given to you by your health care provider. Make sure you discuss any questions you have with your health care provider. Document  Revised: 06/14/2021 Document Reviewed: 06/14/2021 Elsevier Patient Education  [place] After The following information offers guidance on how to care for yourself after your procedure. Your health care provider may also give you more specific instructions. If you have problems or questions, contact your health care provider. What can I expect after the procedure? After the procedure, it is common to have: Tiredness. Little or no memory about what happened during or after the procedure. Impaired judgment when it comes to making decisions. Nausea or vomiting. Some trouble with balance. Follow these instructions at home: For the time period you were told by your health care provider:  Rest. Do not participate in activities where you could fall or become injured. Do not drive or use machinery. Do not drink alcohol. Do not take sleeping pills or medicines that cause drowsiness. Do not make important decisions or sign legal documents. Do not take care of children on your own. Medicines Take over-the-counter and prescription medicines only as told by your health care provider. If you were prescribed antibiotics, take them as told by your health care provider. Do not stop using the antibiotic even if you start to feel better. Eating and drinking Follow instructions from your health care provider about what you may eat and drink. Drink enough fluid to keep your urine pale yellow. If you vomit: Drink clear fluids slowly and in small amounts as you are able. Clear fluids include water, ice chips, low-calorie sports drinks, and fruit juice that has water added to it (diluted fruit juice). Eat light and bland foods in small amounts as you are able. These foods include bananas, applesauce, rice, lean meats, toast, and crackers. General instructions  Have a responsible adult stay with you for the time you are told. It is important to have someone help care  for you until you are awake and alert. If you have sleep apnea, surgery and some medicines can increase your risk for breathing problems. Follow instructions from your health care provider about wearing your sleep device: When you are sleeping. This includes during daytime naps. While taking prescription pain medicines, sleeping medicines, or medicines that make you drowsy. Do not use any products that contain nicotine or tobacco. These products include cigarettes, chewing tobacco, and vaping devices, such as e-cigarettes. If you need help quitting, ask your health care provider. Contact a health care provider if: You feel nauseous or vomit every time you eat or drink. You feel light-headed. You are still sleepy or having trouble with balance after 24 hours. You get a rash. You have a fever. You have redness or swelling around the IV site. Get help right away if: You have trouble breathing. You have new confusion after you get home. These symptoms may be an emergency. Get help right away. Call 911. Do not wait  to see if the symptoms will go away. Do not drive yourself to the hospital. This information is not intended to replace advice given to you by your health care provider. Make sure you discuss any questions you have with your health care provider. Document Revised: 07/31/2021 Document Reviewed: 07/31/2021 Elsevier Patient Education  [place].

## 2022-04-17 ENCOUNTER — Other Ambulatory Visit: Payer: Self-pay

## 2022-04-17 ENCOUNTER — Ambulatory Visit (HOSPITAL_BASED_OUTPATIENT_CLINIC_OR_DEPARTMENT_OTHER): Payer: Medicare Other | Admitting: Anesthesiology

## 2022-04-17 ENCOUNTER — Encounter (HOSPITAL_COMMUNITY): Payer: Self-pay | Admitting: Gastroenterology

## 2022-04-17 ENCOUNTER — Encounter (HOSPITAL_COMMUNITY)
Admission: RE | Disposition: A | Discharge status: Home / Self Care | Payer: Self-pay | Source: Ambulatory Visit | Attending: Gastroenterology

## 2022-04-17 ENCOUNTER — Ambulatory Visit (HOSPITAL_COMMUNITY)
Admission: RE | Admit: 2022-04-17 | Discharge: 2022-04-17 | Disposition: A | Discharge status: Home / Self Care | Payer: Medicare Other | Source: Ambulatory Visit | Attending: Gastroenterology | Admitting: Gastroenterology

## 2022-04-17 ENCOUNTER — Ambulatory Visit (HOSPITAL_COMMUNITY): Payer: Medicare Other | Admitting: Anesthesiology

## 2022-04-17 DIAGNOSIS — I25119 Atherosclerotic heart disease of native coronary artery with unspecified angina pectoris: Secondary | ICD-10-CM | POA: Diagnosis not present

## 2022-04-17 DIAGNOSIS — R0789 Other chest pain: Secondary | ICD-10-CM

## 2022-04-17 DIAGNOSIS — Z955 Presence of coronary angioplasty implant and graft: Secondary | ICD-10-CM | POA: Insufficient documentation

## 2022-04-17 DIAGNOSIS — K2950 Unspecified chronic gastritis without bleeding: Secondary | ICD-10-CM | POA: Diagnosis not present

## 2022-04-17 DIAGNOSIS — Z8547 Personal history of malignant neoplasm of testis: Secondary | ICD-10-CM | POA: Diagnosis not present

## 2022-04-17 DIAGNOSIS — Z8249 Family history of ischemic heart disease and other diseases of the circulatory system: Secondary | ICD-10-CM | POA: Diagnosis not present

## 2022-04-17 DIAGNOSIS — E785 Hyperlipidemia, unspecified: Secondary | ICD-10-CM | POA: Insufficient documentation

## 2022-04-17 DIAGNOSIS — K2100 Gastro-esophageal reflux disease with esophagitis, without bleeding: Secondary | ICD-10-CM | POA: Insufficient documentation

## 2022-04-17 DIAGNOSIS — I4891 Unspecified atrial fibrillation: Secondary | ICD-10-CM | POA: Insufficient documentation

## 2022-04-17 DIAGNOSIS — Z79899 Other long term (current) drug therapy: Secondary | ICD-10-CM | POA: Insufficient documentation

## 2022-04-17 DIAGNOSIS — I10 Essential (primary) hypertension: Secondary | ICD-10-CM | POA: Diagnosis not present

## 2022-04-17 DIAGNOSIS — R1013 Epigastric pain: Secondary | ICD-10-CM

## 2022-04-17 DIAGNOSIS — I2510 Atherosclerotic heart disease of native coronary artery without angina pectoris: Secondary | ICD-10-CM | POA: Diagnosis not present

## 2022-04-17 DIAGNOSIS — K2289 Other specified disease of esophagus: Secondary | ICD-10-CM | POA: Insufficient documentation

## 2022-04-17 DIAGNOSIS — B3781 Candidal esophagitis: Secondary | ICD-10-CM | POA: Insufficient documentation

## 2022-04-17 DIAGNOSIS — Z8349 Family history of other endocrine, nutritional and metabolic diseases: Secondary | ICD-10-CM | POA: Diagnosis not present

## 2022-04-17 DIAGNOSIS — F419 Anxiety disorder, unspecified: Secondary | ICD-10-CM | POA: Insufficient documentation

## 2022-04-17 DIAGNOSIS — I252 Old myocardial infarction: Secondary | ICD-10-CM | POA: Diagnosis not present

## 2022-04-17 HISTORY — PX: BIOPSY: SHX5522

## 2022-04-17 HISTORY — PX: ESOPHAGOGASTRODUODENOSCOPY (EGD) WITH PROPOFOL: SHX5813

## 2022-04-17 SURGERY — ESOPHAGOGASTRODUODENOSCOPY (EGD) WITH PROPOFOL
Anesthesia: General

## 2022-04-17 MED ORDER — LIDOCAINE HCL (CARDIAC) PF 50 MG/5ML IV SOSY
PREFILLED_SYRINGE | INTRAVENOUS | Status: DC | PRN
  Administered 2022-04-17: 100 mg via INTRAVENOUS

## 2022-04-17 MED ORDER — LACTATED RINGERS IV SOLN: INTRAVENOUS | Status: DC

## 2022-04-17 MED ORDER — PROPOFOL 10 MG/ML IV BOLUS
INTRAVENOUS | Status: DC | PRN
  Administered 2022-04-17: 120 mg via INTRAVENOUS
  Administered 2022-04-17: 40 mg via INTRAVENOUS

## 2022-04-17 NOTE — Anesthesia Postprocedure Evaluation (Signed)
Anesthesia Post Note  Patient: [name]  Procedure(s) Performed: ESOPHAGOGASTRODUODENOSCOPY (EGD) WITH PROPOFOL BIOPSY  Patient location during evaluation: Phase II Anesthesia Type: General Level of consciousness: awake and alert and oriented Pain management: pain level controlled Vital Signs Assessment: post-procedure vital signs reviewed and stable Respiratory status: spontaneous breathing, nonlabored ventilation and respiratory function stable Cardiovascular status: blood pressure returned to baseline and stable Postop Assessment: no apparent nausea or vomiting Anesthetic complications: no  No notable events documented.   Last Vitals:  Vitals:   04/17/22 0923 04/17/22 0929  BP: (!) 99/42 (!) 112/57  Pulse: (!) 53   Resp: 13   Temp: 36.7 C   SpO2: 99%     Last Pain:  Vitals:   04/17/22 0923  TempSrc: Oral  PainSc: Asleep                 Katee Wentland C [place]

## 2022-04-17 NOTE — Anesthesia Procedure Notes (Signed)
Date/Time: 04/17/2022 9:06 AM  Performed by: Vista Deck, CRNAPre-anesthesia Checklist: Patient identified, Emergency Drugs available, Suction available, Timeout performed and Patient being monitored Patient Re-evaluated:Patient Re-evaluated prior to induction Oxygen Delivery Method: Nasal Cannula

## 2022-04-17 NOTE — Anesthesia Preprocedure Evaluation (Signed)
Anesthesia Evaluation  Patient identified by MRN, date of birth, ID band Patient awake    Reviewed: Allergy & Precautions, NPO status , Patient's Chart, lab work & pertinent test results, reviewed documented beta blocker date and time   Airway Mallampati: II  TM Distance: >3 FB Neck ROM: Full    Dental  (+) Edentulous Upper, Edentulous Lower   Pulmonary neg pulmonary ROS   Pulmonary exam normal breath sounds clear to auscultation       Cardiovascular Exercise Tolerance: Good hypertension, Pt. on medications and Pt. on home beta blockers + angina  + CAD, + Past MI and + Cardiac Stents  Normal cardiovascular exam+ dysrhythmias Atrial Fibrillation  Rhythm:Regular Rate:Normal     Neuro/Psych  Headaches PSYCHIATRIC DISORDERS Anxiety        GI/Hepatic Neg liver ROS,GERD  Medicated and Controlled,,  Endo/Other  negative endocrine ROS    Renal/GU negative Renal ROS  negative genitourinary   Musculoskeletal negative musculoskeletal ROS (+)    Abdominal   Peds negative pediatric ROS (+)  Hematology negative hematology ROS (+)   Anesthesia Other Findings   Reproductive/Obstetrics negative OB ROS                              Anesthesia Physical Anesthesia Plan  ASA: 3  Anesthesia Plan: General   Post-op Pain Management: Minimal or no pain anticipated   Induction: Intravenous  PONV Risk Score and Plan: Propofol infusion  Airway Management Planned: Nasal Cannula and Natural Airway  Additional Equipment:   Intra-op Plan:   Post-operative Plan:   Informed Consent: I have reviewed the patients History and Physical, chart, labs and discussed the procedure including the risks, benefits and alternatives for the proposed anesthesia with the patient or authorized representative who has indicated his/her understanding and acceptance.     Dental advisory given  Plan Discussed with: CRNA and  Surgeon  Anesthesia Plan Comments:          Anesthesia Quick Evaluation

## 2022-04-17 NOTE — Discharge Instructions (Addendum)
You are being discharged to home.  Resume your previous diet.  We are waiting for your pathology results.  Continue your present medications.  Restart Eliquis and Plavix today.

## 2022-04-17 NOTE — Transfer of Care (Signed)
Immediate Anesthesia Transfer of Care Note  Patient: [name]  Procedure(s) Performed: ESOPHAGOGASTRODUODENOSCOPY (EGD) WITH PROPOFOL BIOPSY  Patient Location: Shortstay  Anesthesia Type:General  Level of Consciousness: drowsy  Airway & Oxygen Therapy: Patient Spontanous Breathing  Post-op Assessment: Report given to RN and Post -op Vital signs reviewed and stable  Post vital signs: Reviewed and stable  Last Vitals:  Vitals Value Taken Time  BP 99/42 04/17/22 0923  Temp 36.7 C 04/17/22 0923  Pulse 53 04/17/22 0923  Resp 13 04/17/22 0923  SpO2 99 % 04/17/22 0923    Last Pain:  Vitals:   04/17/22 0923  TempSrc: Oral  PainSc: Asleep         Complications: No notable events documented.

## 2022-04-17 NOTE — Interval H&P Note (Signed)
History and Physical Interval Note:  04/17/2022 8:18 AM  [name]  has presented today for surgery, with the diagnosis of epigastric pain.  The various methods of treatment have been discussed with the patient and family. After consideration of risks, benefits and other options for treatment, the patient has consented to  Procedure(s) with comments: ESOPHAGOGASTRODUODENOSCOPY (EGD) WITH PROPOFOL (N/A) - 215pm, asa 3 as a surgical intervention.  The patient's history has been reviewed, patient examined, no change in status, stable for surgery.  I have reviewed the patient's chart and labs.  Questions were answered to the patient's satisfaction.     [name]

## 2022-04-17 NOTE — Op Note (Signed)
[facility] Patient Name: [name] Procedure Date: 04/17/2022 8:59 AM MRN: [id number] Date of Birth: 05/14/1945 Attending MD: Maylon Peppers , , [id number] CSN: [id number] Age: 77 Admit Type: Outpatient Procedure:                Upper GI endoscopy Indications:              Epigastric abdominal pain, Chest pain (non cardiac) Providers:                Maylon Peppers, Caprice Kluver, Raphael Gibney,                            Technician Referring MD:              Medicines:                Monitored Anesthesia Care Complications:            No immediate complications. Estimated Blood Loss:     Estimated blood loss: none. Procedure:                Pre-Anesthesia Assessment:                           - Prior to the procedure, a History and Physical                            was performed, and patient medications, allergies                            and sensitivities were reviewed. The patient's                            tolerance of previous anesthesia was reviewed.                           - The risks and benefits of the procedure and the                            sedation options and risks were discussed with the                            patient. All questions were answered and informed                            consent was obtained.                           - ASA Grade Assessment: II - A patient with mild                            systemic disease.                           After obtaining informed consent, the endoscope was                            passed under direct vision. Throughout the  procedure, the patient's blood pressure, pulse, and                            oxygen saturations were monitored continuously. The                            [id number]-H190 ([id number]) scope was introduced through the                            mouth, and advanced to the second part of duodenum.                            The upper GI endoscopy was accomplished  without                            difficulty. The patient tolerated the procedure                            well. Scope In: 9:12:38 AM Scope Out: 9:19:52 AM Total Procedure Duration: 0 hours 7 minutes 14 seconds  Findings:      White nummular lesions were noted in the lower third of the esophagus.       Biopsies were taken with a cold forceps for histology.      The entire examined stomach was normal. Biopsies were taken with a cold       forceps for Helicobacter pylori testing.      The examined duodenum was normal. Impression:               - White nummular lesions in esophageal mucosa.                            Biopsied.                           - Normal stomach. Biopsied.                           - Normal examined duodenum. Moderate Sedation:      Per Anesthesia Care Recommendation:           - Discharge patient to home (ambulatory).                           - Resume previous diet.                           - Await pathology results.                           - Continue present medications.                           -Restart Eliquis and Plavix today. Procedure Code(s):        --- Professional ---                           [id number], Esophagogastroduodenoscopy, flexible,  transoral; with biopsy, single or multiple Diagnosis Code(s):        --- Professional ---                           K22.89, Other specified disease of esophagus                           R10.13, Epigastric pain                           R07.89, Other chest pain CPT copyright 2022 American Medical Association. All rights reserved. The codes documented in this report are preliminary and upon coder review may  be revised to meet current compliance requirements. Maylon Peppers, MD Maylon Peppers,  04/17/2022 9:27:49 AM This report has been signed electronically. Number of Addenda: 0

## 2022-04-19 ENCOUNTER — Other Ambulatory Visit (INDEPENDENT_AMBULATORY_CARE_PROVIDER_SITE_OTHER): Payer: Self-pay | Admitting: Gastroenterology

## 2022-04-19 DIAGNOSIS — B3781 Candidal esophagitis: Secondary | ICD-10-CM

## 2022-04-19 LAB — SURGICAL PATHOLOGY

## 2022-04-19 MED ORDER — FLUCONAZOLE 150 MG PO TABS: 150.0000 mg | ORAL_TABLET | Freq: Every day | ORAL | 0 refills | Status: AC

## 2022-04-23 ENCOUNTER — Encounter (HOSPITAL_COMMUNITY): Payer: Self-pay | Admitting: Gastroenterology

## 2022-04-30 DIAGNOSIS — Z79899 Other long term (current) drug therapy: Secondary | ICD-10-CM | POA: Diagnosis not present

## 2022-04-30 DIAGNOSIS — Z299 Encounter for prophylactic measures, unspecified: Secondary | ICD-10-CM | POA: Diagnosis not present

## 2022-04-30 DIAGNOSIS — Z7189 Other specified counseling: Secondary | ICD-10-CM | POA: Diagnosis not present

## 2022-04-30 DIAGNOSIS — Z0000 Encounter for general adult medical examination without abnormal findings: Secondary | ICD-10-CM | POA: Diagnosis not present

## 2022-04-30 DIAGNOSIS — I25118 Atherosclerotic heart disease of native coronary artery with other forms of angina pectoris: Secondary | ICD-10-CM | POA: Diagnosis not present

## 2022-04-30 DIAGNOSIS — E039 Hypothyroidism, unspecified: Secondary | ICD-10-CM | POA: Diagnosis not present

## 2022-04-30 DIAGNOSIS — I779 Disorder of arteries and arterioles, unspecified: Secondary | ICD-10-CM | POA: Diagnosis not present

## 2022-04-30 DIAGNOSIS — I10 Essential (primary) hypertension: Secondary | ICD-10-CM | POA: Diagnosis not present

## 2022-04-30 DIAGNOSIS — Z1331 Encounter for screening for depression: Secondary | ICD-10-CM | POA: Diagnosis not present

## 2022-04-30 DIAGNOSIS — Z1339 Encounter for screening examination for other mental health and behavioral disorders: Secondary | ICD-10-CM | POA: Diagnosis not present

## 2022-04-30 DIAGNOSIS — Z125 Encounter for screening for malignant neoplasm of prostate: Secondary | ICD-10-CM | POA: Diagnosis not present

## 2022-04-30 DIAGNOSIS — R5383 Other fatigue: Secondary | ICD-10-CM | POA: Diagnosis not present

## 2022-04-30 DIAGNOSIS — E7800 Pure hypercholesterolemia, unspecified: Secondary | ICD-10-CM | POA: Diagnosis not present

## 2022-05-07 ENCOUNTER — Telehealth (INDEPENDENT_AMBULATORY_CARE_PROVIDER_SITE_OTHER): Payer: Self-pay

## 2022-05-07 ENCOUNTER — Other Ambulatory Visit (INDEPENDENT_AMBULATORY_CARE_PROVIDER_SITE_OTHER): Payer: Self-pay | Admitting: Gastroenterology

## 2022-05-07 MED ORDER — PANTOPRAZOLE SODIUM 40 MG PO TBEC: 40.0000 mg | DELAYED_RELEASE_TABLET | Freq: Two times a day (BID) | ORAL | 3 refills | Status: DC

## 2022-05-07 NOTE — Telephone Encounter (Signed)
Patient wants to know if he can increase his Pantoprazole 40 to bid. He says he has been having issues with burning sensation in chest and he is supposed to be on pantoprazole 40 mg once per day and use famotidine prn. He wants to know if we can increase the pantoprazole to 40 mg bid and if needed for break through symptoms take famotidine? He says he has increased the medication to bid and it has already helped. Patient uses [facility] and would like a increased dose sent in to them, if this is ok'd can he use the famotidine prn also? Please advise.

## 2022-05-07 NOTE — Telephone Encounter (Signed)
Should be ok to increase to BID, but should not take famotidine. I sent the prescription to his pharmacy

## 2022-05-07 NOTE — Telephone Encounter (Signed)
Patient aware increased medication sent to pharmacy as he requested and he should not take famotidine if on pantoprazole bid.

## 2022-05-10 DIAGNOSIS — H04123 Dry eye syndrome of bilateral lacrimal glands: Secondary | ICD-10-CM | POA: Diagnosis not present

## 2022-06-04 ENCOUNTER — Telehealth (INDEPENDENT_AMBULATORY_CARE_PROVIDER_SITE_OTHER): Payer: Self-pay | Admitting: *Deleted

## 2022-06-04 NOTE — Telephone Encounter (Signed)
error 

## 2022-06-04 NOTE — Telephone Encounter (Signed)
Patient left voicemail asking for a call back to move up appt. I called pt back and he has appt on 4/22. He is having frequent stools he thinks is coming from protonix. He quit med two days ago but states still having frequent stools. He is asking to stay with Dr. Jenetta Downer but if he has a cancellation to move up his appt with Dr. Loletha Grayer. Advised to call back if symptoms change or get worse.   Mitzie please add to cancellation list with Dr. Jenetta Downer only please.  [phone]

## 2022-06-05 ENCOUNTER — Telehealth (INDEPENDENT_AMBULATORY_CARE_PROVIDER_SITE_OTHER): Payer: Self-pay | Admitting: *Deleted

## 2022-06-05 DIAGNOSIS — M2540 Effusion, unspecified joint: Secondary | ICD-10-CM | POA: Diagnosis not present

## 2022-06-05 DIAGNOSIS — R7982 Elevated C-reactive protein (CRP): Secondary | ICD-10-CM | POA: Diagnosis not present

## 2022-06-05 DIAGNOSIS — M1991 Primary osteoarthritis, unspecified site: Secondary | ICD-10-CM | POA: Diagnosis not present

## 2022-06-05 DIAGNOSIS — M79671 Pain in right foot: Secondary | ICD-10-CM | POA: Diagnosis not present

## 2022-06-05 NOTE — Progress Notes (Unsigned)
GI Office Note    Referring Provider: Glenda Chroman, MD Primary Care Physician:  Glenda Chroman, MD  Primary Gastroenterologist: Dr. Jenetta Downer  Chief Complaint   No chief complaint on file.   History of Present Illness   [name] is a 77 y.o. male presenting today for semiurgent office visit.  Wife called in yesterday with 2-week history of diarrhea.  Last seen in January 2024.  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  Notably, the patient went to the ER on 03/29/2022 after he presented recurrent episodes of abdominal pain in his epigastric and lower retrosternal area for 1 month, which sometimes radiated to his back. He went to the ER on 03/29/2022 - had a TTE which showed normal EF 60 to 65%, no valvulopathy.  He did not have hepatic function or lipase checked at that time.   Patient restarted pantoprazole 40 mg daily after recent ED visit.  Completed EGD as outlined below.  Recently increase pantoprazole to twice daily on February 19 for burning sensation in the chest.  He found twice daily PPI helpful.    EGD January 2024: -White nummular lesions in the esophageal mucosa, Candida esophagitis (treated with fluconazole) -Normal stomach status post biopsy, chronic gastritis, H. pylori negative -Normal duodenum  Last EGD: 2017 Nonerosive gastritis, biopsies negative for H. pylori or dysplasia.  Normal duodenum and esophagus.   Last Colonoscopy:06/28/2021 There was presence of a small polyp in the splenic flexure, 4 polyps in the rectum, transverse colon and a 12 mm serrated polyp in the hepatic flexure.  All polyps were removed, the largest polyp was removed with a hot snare, tattoo was placed next to this polypectomy..  Scar was noted in the mid sigmoid from previous polypectomy.   A. COLON, SPLENIC FLEXURE, HEPATIC FLEXURE, RECTUM, CECAL, TRANSVERSE,  POLYPECTOMY:  Tubular adenomas, a sessile serrated polyp without cytologic dysplasia,   hyperplastic polyp and a benign hamartomatous polyp with.  Negative for high-grade dysplasia.   B. COLON, HEPATIC FLEXURE, POLYPECTOMY:  Inflammatory pseudopolyp (polypoid granulation tissue with ulceration of  the overlying epithelium) with an associated hyperplastic polyp.  Negative for dysplasia and malignancy.    Recommended repeat colonoscopy in 5 years      Medications   Current Outpatient Medications  Medication Sig Dispense Refill   acetaminophen (TYLENOL) 500 MG tablet Take 250 mg by mouth 2 (two) times daily as needed for moderate pain or headache.     ALPRAZolam (XANAX) 0.5 MG tablet Take 0.5 mg by mouth 3 (three) times daily as needed for anxiety.     amLODipine (NORVASC) 5 MG tablet Take 1 tablet (5 mg total) by mouth daily. 90 tablet 3   apixaban (ELIQUIS) 5 MG TABS tablet TAKE (1) TABLET TWICE DAILY. (Patient taking differently: Take 5 mg by mouth 2 (two) times daily.) 60 tablet 5   atorvastatin (LIPITOR) 80 MG tablet TAKE 1 TABLET BY MOUTH AT BEDTIME. 90 tablet 0   clopidogrel (PLAVIX) 75 MG tablet TAKE 1 TABLET BY MOUTH ONCE DAILY. 90 tablet 3   diltiazem (CARDIZEM CD) 120 MG 24 hr capsule TAKE 1 CAPSULE DAILY. 90 capsule 1   isosorbide mononitrate (IMDUR) 30 MG 24 hr tablet TAKE ONE TABLET BY MOUTH ONCE DAILY. 90 tablet 3   metoprolol tartrate (LOPRESSOR) 25 MG tablet Take 1 tablet (25 mg total) by mouth 2 (two) times daily. 180 tablet 3   pantoprazole (PROTONIX) 40 MG tablet Take 1 tablet (40 mg  total) by mouth 2 (two) times daily. 180 tablet 3   No current facility-administered medications for this visit.    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 pylori infection    Headache    Hypercholesteremia    Hyperlipidemia    Hypertension    Hyponatremia 2013   Palpitations    event monior 04/11/05- SR with occ PAC   Personal history of colonic polyps 2 9 2007    Past Surgical History   Past Surgical History:  Procedure Laterality Date   BACK SURGERY     BIOPSY  04/17/2022   Procedure: BIOPSY;  Surgeon: Harvel Quale, MD;  [facility];  Service: Gastroenterology;;   CATARACT EXTRACTION W/PHACO Left 12/08/2012   Procedure: CATARACT EXTRACTION PHACO AND INTRAOCULAR LENS PLACEMENT ([place]);  Surgeon: Tonny Branch, MD;  Location: [facility];  Service: Ophthalmology;  Laterality: Left;  CDE:  23.19   CATARACT EXTRACTION W/PHACO Right 04/15/2014   Procedure: CATARACT EXTRACTION PHACO AND INTRAOCULAR LENS PLACEMENT RIGHT;  Surgeon: Tonny Branch, MD;  Location: [facility];  Service: Ophthalmology;  Laterality: Right;  CDE:10.32   COLONOSCOPY  12/06/2010   Procedure: COLONOSCOPY;  Surgeon: Rogene Houston, MD;  [facility];  Service: Endoscopy;  Laterality: N/A;   COLONOSCOPY N/A 05/23/2016   Procedure: COLONOSCOPY;  Surgeon: Rogene Houston, MD;  [facility]: [facility];  Service: Endoscopy;  Laterality: N/A;  930   COLONOSCOPY WITH PROPOFOL N/A 06/28/2021   Procedure: COLONOSCOPY WITH PROPOFOL;  Surgeon: Rogene Houston, MD;  [facility]: [facility];  Service: Endoscopy;  Laterality: N/A;  1045   CORONARY STENT INTERVENTION N/A 02/24/2018   Procedure: CORONARY STENT INTERVENTION;  Surgeon: Lorretta Harp, MD;  Location: [facility] [facility];  Service: Cardiovascular;  Laterality: N/A;    ESOPHAGOGASTRODUODENOSCOPY N/A 07/20/2015   Procedure: ESOPHAGOGASTRODUODENOSCOPY (EGD);  Surgeon: Rogene Houston, MD;  [facility];  Service: Endoscopy;  Laterality: N/A;  2:55 - moved to 1:55 - Ann notified pt   ESOPHAGOGASTRODUODENOSCOPY (EGD) WITH PROPOFOL N/A 04/17/2022   Procedure: ESOPHAGOGASTRODUODENOSCOPY (EGD) WITH PROPOFOL;  Surgeon: Harvel Quale, MD;  [facility];  Service: Gastroenterology;  Laterality: N/A;  215pm, asa 3   EYE SURGERY     LEFT HEART CATH AND CORONARY ANGIOGRAPHY N/A 02/24/2018   Procedure: LEFT HEART CATH AND CORONARY ANGIOGRAPHY;  Surgeon: Lorretta Harp, MD;  Location: [place] [facility];  Service: Cardiovascular;  Laterality: N/A;   LUMBAR DISC SURGERY Left 2007   "L4-5 ruptured discs"   POLYPECTOMY  05/23/2016   Procedure: POLYPECTOMY;  Surgeon: Rogene Houston, MD;  [facility];  Service: Endoscopy;;  colon   POLYPECTOMY  06/28/2021   Procedure: POLYPECTOMY;  Surgeon: Rogene Houston, MD;  [facility];  Service: Endoscopy;;  splenic flexure;hepatic  flexurex2; cecal polyp;transverse colon polyp;rectal polyp;    Past Family History   Family History  Problem Relation Age of Onset   Hypertension Father    Hyperlipidemia Father    CAD Father        H/O CABG    Past Social History   Social History   Socioeconomic History   Marital status: Married    Spouse name: Not on file   Number of children: 1   Years of education: Not on file   Highest education level: Not on file  Occupational History   Not on file  Tobacco Use   Smoking status: Never   Smokeless tobacco: Never  Vaping Use   Vaping Use: Never used  Substance and Sexual Activity   Alcohol use: No   Drug use: No   Sexual activity: Not on file  Other Topics Concern   Not on file  Social History Narrative   ** Merged History Encounter **       Social Determinants of Health   Financial Resource Strain: Low Risk  (02/25/2018)    Overall Financial Resource Strain (CARDIA)    Difficulty of Paying Living Expenses: Not hard at all  Food Insecurity: No Food Insecurity (03/29/2022)   Hunger Vital Sign    Worried About Running Out of Food in the Last Year: Never true    Ran Out of Food in the Last Year: Never true  Transportation Needs: No Transportation Needs (03/29/2022)   PRAPARE - Hydrologist (Medical): No    Lack of Transportation (Non-Medical): No  Physical Activity: Insufficiently Active (02/25/2018)   Exercise Vital Sign    Days of Exercise per Week: 3 days    Minutes of Exercise per Session: 10 min  Stress: Stress Concern Present (02/25/2018)   [place]    Feeling of Stress : Rather much  Social Connections: Not on file  Intimate Partner Violence: Not At Risk (03/29/2022)   Humiliation, Afraid, Rape, and Kick questionnaire    Fear of Current or Ex-Partner: No    Emotionally Abused: No    Physically Abused: No    Sexually Abused: No    Review of Systems   General: Negative for anorexia, weight loss, fever, chills, fatigue, weakness. ENT: Negative for hoarseness, difficulty swallowing , nasal congestion. CV: Negative for chest pain, angina, palpitations, dyspnea on exertion, peripheral edema.  Respiratory: Negative for dyspnea at rest, dyspnea on exertion, cough, sputum, wheezing.  GI: See history of present illness. GU:  Negative for dysuria, hematuria, urinary incontinence, urinary frequency, nocturnal urination.  Endo: Negative for unusual weight change.     Physical Exam   There were no vitals taken for this visit.   General: Well-nourished, well-developed in no acute distress.  Eyes: No icterus. Mouth: Oropharyngeal mucosa moist and pink , no lesions erythema or exudate. Lungs: Clear to auscultation bilaterally.  Heart: Regular rate and rhythm, no murmurs rubs or gallops.  Abdomen: Bowel sounds  are normal, nontender, nondistended, no hepatosplenomegaly or masses,  no abdominal bruits or hernia , no rebound or guarding.  Rectal: ***  Extremities: No lower extremity edema. No clubbing or deformities. Neuro: Alert and oriented x 4   Skin: Warm and dry, no jaundice.   Psych: Alert and cooperative, normal mood and affect.  Labs   Lab Results  Component Value Date   WBC 6.6 03/29/2022   HGB 13.2 03/29/2022   HCT 40.5 03/29/2022  MCV 92.5 03/29/2022   PLT 251 03/29/2022    Imaging Studies   No results found.  Assessment       PLAN   ***   Laureen Ochs. Bobby Rumpf, [place], [facility]

## 2022-06-05 NOTE — Telephone Encounter (Signed)
Wife called back to check on message. Appt was made with leslie tomorrow at 8 am. Patient aware to go to [address] location.

## 2022-06-05 NOTE — Telephone Encounter (Signed)
Noted  

## 2022-06-05 NOTE — Telephone Encounter (Signed)
Spoke with wife today, patient has presented diarrhea for the last 2 weeks, having 5-6 bowel movements per day.  He had some abdominal cramping that improved after he restarting Protonix today (he stopped it for the last 3 days but he did not have any improvement in his diarrhea).  Notably he has been on PPI twice daily chronically in the past without having diarrhea.  Given the acute onset of symptoms we will need to rule out infectious causes (C. difficile and GI pathogen panel) which will be ordered tomorrow when he sees [facility].  Wife understood and agreed.

## 2022-06-05 NOTE — Telephone Encounter (Signed)
Patient has appt on 4/22. He called yesterday asking for appt to be moved up but only wanted to see dr Jenetta Downer. He was added to cancellation list. He called back today and left message to call him back. I called back but he was unavailable and I spoke with his wife ( on dpr) and she states for past 2 weeks he has been having diarrhea every time he eats. He is not taking anything for diarrhea and having cramping in his stomach. He stopped pantoprazole because he thought that it was causing his symptoms. Symptoms did not improve after stopping med and he took a pantoprazole last night when stomach was cramping and wife states it helped.   [facility]  [phone]

## 2022-06-06 ENCOUNTER — Encounter: Payer: Self-pay | Admitting: Gastroenterology

## 2022-06-06 ENCOUNTER — Ambulatory Visit (INDEPENDENT_AMBULATORY_CARE_PROVIDER_SITE_OTHER): Payer: Medicare Other | Admitting: Gastroenterology

## 2022-06-06 VITALS — BP 150/68 | HR 64 | Temp 97.5°F | Ht 67.0 in | Wt 139.2 lb

## 2022-06-06 DIAGNOSIS — R1084 Generalized abdominal pain: Secondary | ICD-10-CM | POA: Diagnosis not present

## 2022-06-06 DIAGNOSIS — R197 Diarrhea, unspecified: Secondary | ICD-10-CM

## 2022-06-06 DIAGNOSIS — R109 Unspecified abdominal pain: Secondary | ICD-10-CM | POA: Insufficient documentation

## 2022-06-06 NOTE — Patient Instructions (Signed)
Complete stools and labs as soon as possible. We will be in touch with results as available. If you have more than 5 stools per day, take 1/2 imodium tablet.  Continue pantoprazole 40mg  twice a day before breakfast and evening meal. Try eating low fat/bland foods and avoid dairy until your diarrhea is improved.  Continue to monitor blood pressures, goal of 140/90 or less. If remains elevated reach out to your PCP.

## 2022-06-07 LAB — COMPREHENSIVE METABOLIC PANEL
AG Ratio: 1.4 (calc) (ref 1.0–2.5)
ALT: 19 U/L (ref 9–46)
AST: 16 U/L (ref 10–35)
Albumin: 4.3 g/dL (ref 3.6–5.1)
Alkaline phosphatase (APISO): 95 U/L (ref 35–144)
BUN: 14 mg/dL (ref 7–25)
CO2: 26 mmol/L (ref 20–32)
Calcium: 9.4 mg/dL (ref 8.6–10.3)
Chloride: 104 mmol/L (ref 98–110)
Creat: 0.8 mg/dL (ref 0.70–1.28)
Globulin: 3.1 g/dL (calc) (ref 1.9–3.7)
Glucose, Bld: 149 mg/dL — ABNORMAL HIGH (ref 65–139)
Potassium: 3.5 mmol/L (ref 3.5–5.3)
Sodium: 139 mmol/L (ref 135–146)
Total Bilirubin: 0.4 mg/dL (ref 0.2–1.2)
Total Protein: 7.4 g/dL (ref 6.1–8.1)

## 2022-06-07 LAB — CBC WITH DIFFERENTIAL/PLATELET
Absolute Monocytes: 766 cells/uL (ref 200–950)
Basophils Absolute: 59 cells/uL (ref 0–200)
Basophils Relative: 0.9 %
Eosinophils Absolute: 172 cells/uL (ref 15–500)
Eosinophils Relative: 2.6 %
HCT: 37 % — ABNORMAL LOW (ref 38.5–50.0)
Hemoglobin: 12.5 g/dL — ABNORMAL LOW (ref 13.2–17.1)
Lymphs Abs: 1142 cells/uL (ref 850–3900)
MCH: 29.8 pg (ref 27.0–33.0)
MCHC: 33.8 g/dL (ref 32.0–36.0)
MCV: 88.1 fL (ref 80.0–100.0)
MPV: 10.9 fL (ref 7.5–12.5)
Monocytes Relative: 11.6 %
Neutro Abs: 4462 cells/uL (ref 1500–7800)
Neutrophils Relative %: 67.6 %
Platelets: 236 10*3/uL (ref 140–400)
RBC: 4.2 10*6/uL (ref 4.20–5.80)
RDW: 12.5 % (ref 11.0–15.0)
Total Lymphocyte: 17.3 %
WBC: 6.6 10*3/uL (ref 3.8–10.8)

## 2022-06-07 LAB — LIPASE: Lipase: 164 U/L — ABNORMAL HIGH (ref 7–60)

## 2022-06-08 LAB — GASTROINTESTINAL PATHOGEN PNL
CampyloBacter Group: NOT DETECTED
Norovirus GI/GII: NOT DETECTED
Rotavirus A: NOT DETECTED
Salmonella species: NOT DETECTED
Shiga Toxin 1: NOT DETECTED
Shiga Toxin 2: NOT DETECTED
Shigella Species: NOT DETECTED
Vibrio Group: NOT DETECTED
Yersinia enterocolitica: NOT DETECTED

## 2022-06-08 LAB — C. DIFFICILE GDH AND TOXIN A/B
GDH ANTIGEN: NOT DETECTED
MICRO NUMBER:: 14722816
SPECIMEN QUALITY:: ADEQUATE
TOXIN A AND B: NOT DETECTED

## 2022-06-12 ENCOUNTER — Telehealth: Payer: Self-pay

## 2022-06-12 NOTE — Telephone Encounter (Signed)
Pt called wanting to know results to recent blood work.

## 2022-06-12 NOTE — Telephone Encounter (Signed)
See result note.  

## 2022-06-18 ENCOUNTER — Other Ambulatory Visit: Payer: Self-pay

## 2022-06-18 DIAGNOSIS — K589 Irritable bowel syndrome without diarrhea: Secondary | ICD-10-CM

## 2022-06-18 DIAGNOSIS — R1084 Generalized abdominal pain: Secondary | ICD-10-CM

## 2022-06-18 DIAGNOSIS — R1013 Epigastric pain: Secondary | ICD-10-CM

## 2022-06-26 ENCOUNTER — Encounter: Payer: Self-pay | Admitting: Internal Medicine

## 2022-06-26 ENCOUNTER — Ambulatory Visit (INDEPENDENT_AMBULATORY_CARE_PROVIDER_SITE_OTHER): Payer: Medicare Other | Admitting: Internal Medicine

## 2022-06-26 VITALS — BP 140/70 | HR 63 | Ht 68.0 in | Wt 137.6 lb

## 2022-06-26 DIAGNOSIS — F419 Anxiety disorder, unspecified: Secondary | ICD-10-CM

## 2022-06-26 DIAGNOSIS — Z0001 Encounter for general adult medical examination with abnormal findings: Secondary | ICD-10-CM

## 2022-06-26 DIAGNOSIS — I10 Essential (primary) hypertension: Secondary | ICD-10-CM

## 2022-06-26 DIAGNOSIS — E785 Hyperlipidemia, unspecified: Secondary | ICD-10-CM | POA: Diagnosis not present

## 2022-06-26 DIAGNOSIS — R109 Unspecified abdominal pain: Secondary | ICD-10-CM

## 2022-06-26 DIAGNOSIS — I25118 Atherosclerotic heart disease of native coronary artery with other forms of angina pectoris: Secondary | ICD-10-CM

## 2022-06-26 DIAGNOSIS — I480 Paroxysmal atrial fibrillation: Secondary | ICD-10-CM

## 2022-06-26 NOTE — Patient Instructions (Signed)
It was a pleasure to see you today.  Thank you for giving [name] the opportunity to be involved in your care.  Below is a brief recap of your visit and next steps.  We will plan to see you again in 4 months.  Summary You have established care today None of your medications have been changed We will request records from Dr. Sherril Croon and plan for follow up in 4 months

## 2022-06-26 NOTE — Progress Notes (Signed)
New Patient Office Visit  Subjective    Patient ID: [name], male    DOB: February 15, 1946  Age: 77 y.o. MRN: [id number]  CC:  Chief Complaint  Patient presents with   Establish Care   HPI [name] presents to establish care.  He is a 77 year old male with a past medical history significant for HTN, CAD s/p PCI, HLD, chronic abdominal pain, paroxysmal atrial fibrillation, and anxiety.  He was most recently followed by Dr. Sherril Croon with [facility]. [name] reports feeling well today.  He is asymptomatic and has no acute concerns to discuss.  He denies tobacco, alcohol, and illicit drug use.  His family medical history is significant for HTN, HLD, and CAD.  Chronic medical conditions and outstanding primary care items discussed today are individually addressed A/P below.  Outpatient Encounter Medications as of 06/26/2022  Medication Sig   acetaminophen (TYLENOL) 500 MG tablet Take 250 mg by mouth 2 (two) times daily as needed for moderate pain or headache.   ALPRAZolam (XANAX) 0.5 MG tablet Take 0.5 mg by mouth 3 (three) times daily as needed for anxiety.   amLODipine (NORVASC) 5 MG tablet Take 1 tablet (5 mg total) by mouth daily.   apixaban (ELIQUIS) 5 MG TABS tablet TAKE (1) TABLET TWICE DAILY. (Patient taking differently: Take 5 mg by mouth 2 (two) times daily.)   atorvastatin (LIPITOR) 80 MG tablet TAKE 1 TABLET BY MOUTH AT BEDTIME.   clopidogrel (PLAVIX) 75 MG tablet TAKE 1 TABLET BY MOUTH ONCE DAILY.   diltiazem (CARDIZEM CD) 120 MG 24 hr capsule TAKE 1 CAPSULE DAILY.   isosorbide mononitrate (IMDUR) 30 MG 24 hr tablet TAKE ONE TABLET BY MOUTH ONCE DAILY.   metoprolol tartrate (LOPRESSOR) 25 MG tablet Take 1 tablet (25 mg total) by mouth 2 (two) times daily.   pantoprazole (PROTONIX) 40 MG tablet Take 1 tablet (40 mg total) by mouth 2 (two) times daily.   No facility-administered encounter medications on file as of 06/26/2022.    Past Medical History:  Diagnosis Date   Anxiety     Atrial fibrillation    Cancer    testicular    Chest pain    myoview 07/09/12-normal, nl ef; echo 04/12/05-ef>55%, mild aortic sclerosis   GERD (gastroesophageal reflux disease)    H. pylori infection    Headache    Hypercholesteremia    Hyperlipidemia    Hypertension    Hyponatremia 2013   Palpitations    event monior 04/11/05- SR with occ PAC   Personal history of colonic polyps 2 9 2007    Past Surgical History:  Procedure Laterality Date   BACK SURGERY     BIOPSY  04/17/2022   Procedure: BIOPSY;  Surgeon: Dolores Frame, MD;  [facility]: [facility];  Service: Gastroenterology;;   CATARACT EXTRACTION W/PHACO Left 12/08/2012   Procedure: CATARACT EXTRACTION PHACO AND INTRAOCULAR LENS PLACEMENT (IOC);  Surgeon: Gemma Payor, MD;  Location: [facility];  Service: Ophthalmology;  Laterality: Left;  CDE:  23.19   CATARACT EXTRACTION W/PHACO Right 04/15/2014   Procedure: CATARACT EXTRACTION PHACO AND INTRAOCULAR LENS PLACEMENT RIGHT;  Surgeon: Gemma Payor, MD;  Location: [facility];  Service: Ophthalmology;  Laterality: Right;  CDE:10.32   COLONOSCOPY  12/06/2010   Procedure: COLONOSCOPY;  Surgeon: Malissa Hippo, MD;  [facility];  Service: Endoscopy;  Laterality: N/A;   COLONOSCOPY N/A 05/23/2016   Procedure: COLONOSCOPY;  Surgeon: Malissa Hippo, MD;  [facility];  Service: Endoscopy;  Laterality: N/A;  930   COLONOSCOPY WITH PROPOFOL N/A 06/28/2021   Procedure: COLONOSCOPY WITH PROPOFOL;  Surgeon: Malissa Hippoehman, Najeeb U, MD;  [facility]: [facility];  Service: Endoscopy;  Laterality: N/A;  1045   CORONARY STENT INTERVENTION N/A 02/24/2018   Procedure: CORONARY STENT INTERVENTION;  Surgeon: Runell GessBerry, Jonathan J, MD;  Location: [facility];  Service: Cardiovascular;  Laterality: N/A;   ESOPHAGOGASTRODUODENOSCOPY N/A 07/20/2015   Procedure: ESOPHAGOGASTRODUODENOSCOPY (EGD);  Surgeon: Malissa HippoNajeeb U Rehman, MD;  [facility]: [facility];  Service: Endoscopy;  Laterality: N/A;   2:55 - moved to 1:55 - Ann notified pt   ESOPHAGOGASTRODUODENOSCOPY (EGD) WITH PROPOFOL N/A 04/17/2022   Procedure: ESOPHAGOGASTRODUODENOSCOPY (EGD) WITH PROPOFOL;  Surgeon: Dolores Frameastaneda Mayorga, Daniel, MD;  [facility];  Service: Gastroenterology;  Laterality: N/A;  215pm, asa 3   EYE SURGERY     LEFT HEART CATH AND CORONARY ANGIOGRAPHY N/A 02/24/2018   Procedure: LEFT HEART CATH AND CORONARY ANGIOGRAPHY;  Surgeon: Runell GessBerry, Jonathan J, MD;  [facility];  Service: Cardiovascular;  Laterality: N/A;   LUMBAR DISC SURGERY Left 2007   "L4-5 ruptured discs"   POLYPECTOMY  05/23/2016   Procedure: POLYPECTOMY;  Surgeon: Malissa HippoNajeeb U Rehman, MD;  [facility];  Service: Endoscopy;;  colon   POLYPECTOMY  06/28/2021   Procedure: POLYPECTOMY;  Surgeon: Malissa Hippoehman, Najeeb U, MD;  [facility];  Service: Endoscopy;;  splenic flexure;hepatic flexurex2; cecal polyp;transverse colon polyp;rectal polyp;    Family History  Problem Relation Age of Onset   Hypertension Father    Hyperlipidemia Father    CAD Father        H/O CABG    Social History   Socioeconomic History   Marital status: Married    Spouse name: Not on file   Number of children: 1   Years of education: Not on file   Highest education level: Not on file  Occupational History   Not on file  Tobacco Use   Smoking status: Never   Smokeless tobacco: Never  Vaping Use   Vaping Use: Never used  Substance and Sexual Activity   Alcohol use: No   Drug use: No   Sexual activity: Not Currently  Other Topics Concern   Not on file  Social History Narrative   ** Merged History Encounter **       Social Determinants of Health   Financial Resource Strain: Low Risk  (02/25/2018)   Overall Financial Resource Strain (CARDIA)    Difficulty of Paying Living Expenses: Not hard at all  Food Insecurity: No Food Insecurity (03/29/2022)   Hunger Vital Sign    Worried About Running Out of Food in the Last Year:  Never true    Ran Out of Food in the Last Year: Never true  Transportation Needs: No Transportation Needs (03/29/2022)   PRAPARE - Administrator, Civil ServiceTransportation    Lack of Transportation (Medical): No    Lack of Transportation (Non-Medical): No  Physical Activity: Insufficiently Active (02/25/2018)   Exercise Vital Sign    Days of Exercise per Week: 3 days    Minutes of Exercise per Session: 10 min  Stress: Stress Concern Present (02/25/2018)   [employer] - Occupational Stress Questionnaire    Feeling of Stress : Rather much  Social Connections: Not on file  Intimate Partner Violence: Not At Risk (03/29/2022)   Humiliation, Afraid, Rape, and Kick questionnaire    Fear of Current or  Ex-Partner: No    Emotionally Abused: No    Physically Abused: No    Sexually Abused: No   Review of Systems  Constitutional:  Negative for chills and fever.  HENT:  Negative for sore throat.   Respiratory:  Negative for cough and shortness of breath.   Cardiovascular:  Negative for chest pain, palpitations and leg swelling.  Gastrointestinal:  Negative for abdominal pain, blood in stool, constipation, diarrhea, nausea and vomiting.  Genitourinary:  Negative for dysuria and hematuria.  Musculoskeletal:  Negative for myalgias.  Skin:  Negative for itching and rash.  Neurological:  Negative for dizziness and headaches.  Psychiatric/Behavioral:  Negative for depression and suicidal ideas.    Objective    BP (!) 140/70   Pulse 63   Ht 5\' 8"  (1.727 m)   Wt 137 lb 9.6 oz (62.4 kg)   SpO2 98%   BMI 20.92 kg/m   Physical Exam Vitals reviewed.  Constitutional:      General: He is not in acute distress.    Appearance: Normal appearance. He is not ill-appearing.  HENT:     Head: Normocephalic and atraumatic.     Right Ear: External ear normal.     Left Ear: External ear normal.     Nose: Nose normal. No congestion or rhinorrhea.     Mouth/Throat:     Mouth: Mucous membranes are  moist.     Pharynx: Oropharynx is clear.  Eyes:     General: No scleral icterus.    Extraocular Movements: Extraocular movements intact.     Conjunctiva/sclera: Conjunctivae normal.     Pupils: Pupils are equal, round, and reactive to light.  Cardiovascular:     Rate and Rhythm: Normal rate and regular rhythm.     Pulses: Normal pulses.     Heart sounds: Normal heart sounds. No murmur heard. Pulmonary:     Effort: Pulmonary effort is normal.     Breath sounds: Normal breath sounds. No wheezing, rhonchi or rales.  Abdominal:     General: Abdomen is flat. Bowel sounds are normal. There is no distension.     Palpations: Abdomen is soft.     Tenderness: There is no abdominal tenderness.  Musculoskeletal:        General: No swelling or deformity. Normal range of motion.     Cervical back: Normal range of motion.  Skin:    General: Skin is warm and dry.     Capillary Refill: Capillary refill takes less than 2 seconds.  Neurological:     General: No focal deficit present.     Mental Status: He is alert and oriented to person, place, and time.     Motor: No weakness.  Psychiatric:        Mood and Affect: Mood normal.        Behavior: Behavior normal.        Thought Content: Thought content normal.   Last CBC Lab Results  Component Value Date   WBC 6.6 06/06/2022   HGB 12.5 (L) 06/06/2022   HCT 37.0 (L) 06/06/2022   MCV 88.1 06/06/2022   MCH 29.8 06/06/2022   RDW 12.5 06/06/2022   PLT 236 06/06/2022   Last metabolic panel Lab Results  Component Value Date   GLUCOSE 149 (H) 06/06/2022   NA 139 06/06/2022   K 3.5 06/06/2022   CL 104 06/06/2022   CO2 26 06/06/2022   BUN 14 06/06/2022   CREATININE 0.80 06/06/2022   GFRNONAA >60 03/29/2022  CALCIUM 9.4 06/06/2022   PROT 7.4 06/06/2022   ALBUMIN 3.9 08/10/2020   BILITOT 0.4 06/06/2022   ALKPHOS 81 08/10/2020   AST 16 06/06/2022   ALT 19 06/06/2022   ANIONGAP 8 03/29/2022   Last lipids Lab Results  Component Value  Date   CHOL 174 08/27/2017   HDL 51 08/27/2017   LDLCALC 93 08/27/2017   TRIG 150 (H) 08/27/2017   CHOLHDL 3.4 08/27/2017   Last thyroid functions Lab Results  Component Value Date   TSH 3.047 06/29/2016   T4TOTAL 6.5 11/14/2015    Assessment & Plan:   Problem List Items Addressed This Visit       Essential hypertension    BP mildly elevated today, 142/75 initially and 140/70 on repeat.  He is currently prescribed metoprolol, diltiazem, Imdur, and amlodipine.  He endorses significant anxiety related to establishing care with a new provider. -No medication changes for now.  He has previously been well-controlled on this regimen.  Continue to monitor BP at subsequent appointments.      PAF (paroxysmal atrial fibrillation)    RRR on exam today.  He is prescribed Eliquis, diltiazem, and metoprolol. -No medication changes today.      CAD (coronary artery disease) - Primary    Followed by cardiology (Dr. Allyson Sabal).  S/p PCI to mid RCA in December 2019.  Denies recent chest pain.  Currently prescribed Plavix, atorvastatin, and metoprolol. -No medication changes today -Cardiology follow-up scheduled for next week      Anxiety    He endorses a history of anxiety and is prescribed Xanax 0.5 mg 3 times daily as needed.  PDMP reviewed and is appropriate.  Rx last filled in February. -No medication changes today      Dyslipidemia    Lipid panel updated in February.  Total cholesterol 150 and LDL 88.  Currently prescribed atorvastatin 80 mg daily. -No medication changes today      Abdominal pain    He endorses a history of chronic abdominal pain.  Recent history of candidal esophagitis.  Asymptomatic currently.  Followed by GI (Dr. Levon Hedger). -Continue PPI      Encounter for general adult medical examination with abnormal findings    Presenting today to establish care.  Available records and labs been reviewed. -Recent labs reviewed -Records from his previous PCP have been  requested -We will tentatively plan for follow-up in 4 months       Return in about 4 months (around 10/26/2022).   Billie Lade, MD

## 2022-07-02 DIAGNOSIS — R1013 Epigastric pain: Secondary | ICD-10-CM | POA: Diagnosis not present

## 2022-07-02 DIAGNOSIS — Z0001 Encounter for general adult medical examination with abnormal findings: Secondary | ICD-10-CM | POA: Insufficient documentation

## 2022-07-02 DIAGNOSIS — R1084 Generalized abdominal pain: Secondary | ICD-10-CM | POA: Diagnosis not present

## 2022-07-02 DIAGNOSIS — K589 Irritable bowel syndrome without diarrhea: Secondary | ICD-10-CM | POA: Diagnosis not present

## 2022-07-02 NOTE — Assessment & Plan Note (Addendum)
RRR on exam today.  He is prescribed Eliquis, diltiazem, and metoprolol. -No medication changes today.

## 2022-07-02 NOTE — Assessment & Plan Note (Signed)
Lipid panel updated in February.  Total cholesterol 150 and LDL 88.  Currently prescribed atorvastatin 80 mg daily. -No medication changes today

## 2022-07-02 NOTE — Assessment & Plan Note (Signed)
Followed by cardiology (Dr. Allyson Sabal).  S/p PCI to mid RCA in December 2019.  Denies recent chest pain.  Currently prescribed Plavix, atorvastatin, and metoprolol. -No medication changes today -Cardiology follow-up scheduled for next week

## 2022-07-02 NOTE — Assessment & Plan Note (Signed)
He endorses a history of chronic abdominal pain.  Recent history of candidal esophagitis.  Asymptomatic currently.  Followed by GI (Dr. Levon Hedger). -Continue PPI

## 2022-07-02 NOTE — Assessment & Plan Note (Signed)
Presenting today to establish care.  Available records and labs been reviewed. -Recent labs reviewed -Records from his previous PCP have been requested -We will tentatively plan for follow-up in 4 months

## 2022-07-02 NOTE — Assessment & Plan Note (Signed)
He endorses a history of anxiety and is prescribed Xanax 0.5 mg 3 times daily as needed.  PDMP reviewed and is appropriate.  Rx last filled in February. -No medication changes today

## 2022-07-02 NOTE — Assessment & Plan Note (Signed)
BP mildly elevated today, 142/75 initially and 140/70 on repeat.  He is currently prescribed metoprolol, diltiazem, Imdur, and amlodipine.  He endorses significant anxiety related to establishing care with a new provider. -No medication changes for now.  He has previously been well-controlled on this regimen.  Continue to monitor BP at subsequent appointments.

## 2022-07-03 LAB — CBC WITH DIFFERENTIAL/PLATELET
Absolute Monocytes: 790 cells/uL (ref 200–950)
Basophils Absolute: 91 cells/uL (ref 0–200)
Basophils Relative: 1.2 %
Eosinophils Absolute: 395 cells/uL (ref 15–500)
Eosinophils Relative: 5.2 %
HCT: 38.2 % — ABNORMAL LOW (ref 38.5–50.0)
Hemoglobin: 12.5 g/dL — ABNORMAL LOW (ref 13.2–17.1)
Lymphs Abs: 2158 cells/uL (ref 850–3900)
MCH: 29.6 pg (ref 27.0–33.0)
MCHC: 32.7 g/dL (ref 32.0–36.0)
MCV: 90.3 fL (ref 80.0–100.0)
MPV: 11 fL (ref 7.5–12.5)
Monocytes Relative: 10.4 %
Neutro Abs: 4165 cells/uL (ref 1500–7800)
Neutrophils Relative %: 54.8 %
Platelets: 252 10*3/uL (ref 140–400)
RBC: 4.23 10*6/uL (ref 4.20–5.80)
RDW: 12.8 % (ref 11.0–15.0)
Total Lymphocyte: 28.4 %
WBC: 7.6 10*3/uL (ref 3.8–10.8)

## 2022-07-03 LAB — IRON,TIBC AND FERRITIN PANEL
%SAT: 20 % (calc) (ref 20–48)
Ferritin: 33 ng/mL (ref 24–380)
Iron: 64 ug/dL (ref 50–180)
TIBC: 328 mcg/dL (calc) (ref 250–425)

## 2022-07-03 LAB — LIPASE: Lipase: 60 U/L (ref 7–60)

## 2022-07-04 ENCOUNTER — Ambulatory Visit: Payer: Medicare Other | Attending: Cardiovascular Disease | Admitting: Cardiovascular Disease

## 2022-07-04 ENCOUNTER — Encounter: Payer: Self-pay | Admitting: Cardiovascular Disease

## 2022-07-04 VITALS — BP 128/64 | HR 60 | Ht 68.0 in | Wt 138.6 lb

## 2022-07-04 DIAGNOSIS — I2510 Atherosclerotic heart disease of native coronary artery without angina pectoris: Secondary | ICD-10-CM | POA: Diagnosis not present

## 2022-07-04 DIAGNOSIS — E785 Hyperlipidemia, unspecified: Secondary | ICD-10-CM | POA: Diagnosis not present

## 2022-07-04 DIAGNOSIS — I10 Essential (primary) hypertension: Secondary | ICD-10-CM | POA: Diagnosis not present

## 2022-07-04 DIAGNOSIS — I480 Paroxysmal atrial fibrillation: Secondary | ICD-10-CM | POA: Diagnosis not present

## 2022-07-04 MED ORDER — ISOSORBIDE MONONITRATE ER 30 MG PO TB24: 30.0000 mg | ORAL_TABLET | Freq: Every day | ORAL | 3 refills | Status: DC

## 2022-07-04 MED ORDER — CLOPIDOGREL BISULFATE 75 MG PO TABS: 75.0000 mg | ORAL_TABLET | Freq: Every day | ORAL | 3 refills | Status: DC

## 2022-07-04 MED ORDER — DILTIAZEM HCL ER COATED BEADS 120 MG PO CP24: 120.0000 mg | ORAL_CAPSULE | Freq: Every day | ORAL | 3 refills | Status: DC

## 2022-07-04 MED ORDER — APIXABAN 5 MG PO TABS: 5.0000 mg | ORAL_TABLET | Freq: Two times a day (BID) | ORAL | 0 refills | Status: DC

## 2022-07-04 MED ORDER — AMLODIPINE BESYLATE 5 MG PO TABS: 5.0000 mg | ORAL_TABLET | Freq: Every day | ORAL | 3 refills | Status: DC

## 2022-07-04 MED ORDER — APIXABAN 5 MG PO TABS: 5.0000 mg | ORAL_TABLET | Freq: Two times a day (BID) | ORAL | 3 refills | Status: DC

## 2022-07-04 MED ORDER — METOPROLOL TARTRATE 25 MG PO TABS: 25.0000 mg | ORAL_TABLET | Freq: Two times a day (BID) | ORAL | 3 refills | Status: DC

## 2022-07-04 MED ORDER — ATORVASTATIN CALCIUM 80 MG PO TABS: 80.0000 mg | ORAL_TABLET | Freq: Every day | ORAL | 3 refills | Status: DC

## 2022-07-04 NOTE — Assessment & Plan Note (Signed)
History of essential hypertension blood pressure measured today at 128/64.  He is on amlodipine, diltiazem, and metoprolol.

## 2022-07-04 NOTE — Assessment & Plan Note (Signed)
History of CAD status post cardiac catheterization by myself 02/24/2018 revealing a high-grade mid RCA stenosis.  Unfortunately with initial balloon dilatation he had a spiral dissection requiring 4 overlapping Synergy drug-eluting stents.  Otherwise she did well.  He had a recent Myoview stress test 03/30/2022 which was low risk and nonischemic as well as a normal 2D echo.

## 2022-07-04 NOTE — Addendum Note (Signed)
Addended by: Bernita Buffy on: 07/04/2022 02:14 PM   Modules accepted: Orders

## 2022-07-04 NOTE — Addendum Note (Signed)
Addended by: Bernita Buffy on: 07/04/2022 03:21 PM   Modules accepted: Orders

## 2022-07-04 NOTE — Assessment & Plan Note (Signed)
History of PAF maintaining sinus rhythm on Eliquis oral anticoagulation. 

## 2022-07-04 NOTE — Assessment & Plan Note (Signed)
History of dyslipidemia on high-dose statin therapy with recent lipid profile performed 04/30/2022 revealing total cholesterol 150, LDL 88 and HDL of 50.

## 2022-07-04 NOTE — Progress Notes (Signed)
07/04/2022 [name]   02/17/1946  [id number]  Primary Physician Billie Lade, MD Primary Cardiologist: [name] MD FACP, [place], [place], [place]  HPI:  [name] is a 77 y.o.    well-appearing Caucasian male who I last  saw him in the office 08/08/2021. He has a history of hypertension and hyperlipidemia. He had atypical chest pain here tone with a negative Myoview 07/09/12. His pain improved with proton pump inhibition. Because of recurrent chest pain he saw Boyce Medici, PA-C in the office who did obtain a 2-D echo and Myoview perfusion study all normal. His pain has improved again on a PPI suggesting that it was related to GERD. Since I saw [name] in the office a year ago he has been well until this past Christmas when he was admitted to [facility] with A. fib with RVR. He converted to normal sinus rhythm with IV diltiazem and was converted to by mouth Cardizem. His troponins were negative and 2-D echo was normal. Because of his elevated CHA2DsVASC2 score of 2 he was begun on Eliquis oral anticoagulation. He saw Rudi Coco in the A. fib clinic 04/19/16 because of palpitations. He says he occasionally feels his heart skip or missed AB. She also had a more prolonged episode of PAF lasting 20 seconds. She gave him when necessary diltiazem 30 mg a day for persistently elevated heart rate. He was just seen in the emergency room 07/07/16 by Dr. Erma Heritage. He was complaining of palpitations and was noted to have PVCs.   He was admitted to the hospital and had cardiac cath by myself 02/24/2018 revealing a high-grade mid RCA stenosis.  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  His EF was preserved without inferior wall motion abnormality.  He was on "triple therapy" for 1 month after which aspirin was discontinued.  He remains on Plavix and Eliquis.     He did see Joni Reining, NP in the office  12/11/2018 who added a PPI and gave him some Xanax.  He was seen in the ER 05/16/2019 with epigastric pain.  His EKG showed no acute changes.  His troponins were negative x2.  He does get occasional episodes of epigastric pain on Protonix.     Since I saw him in the office a year ago he continues to do well.  He had a recent Myoview stress test 03/30/2022 which was low risk and nonischemic and 2D echo performed same time that showed normal LV systolic function without valvular abnormalities.  He is otherwise asymptomatic and denies chest pain or shortness of breath.  He walks his dog on a daily basis.   Current Meds  Medication Sig   acetaminophen (TYLENOL) 500 MG tablet Take 250 mg by mouth 2 (two) times daily as needed for moderate pain or headache.   ALPRAZolam (XANAX) 0.5 MG tablet Take 0.5 mg by mouth 3 (three) times daily as needed for anxiety.   amLODipine (NORVASC) 5 MG tablet Take 1 tablet (5 mg total) by mouth daily.   apixaban (ELIQUIS) 5 MG TABS tablet TAKE (1) TABLET TWICE DAILY. (Patient taking differently: Take 5 mg by mouth 2 (two) times daily.)   atorvastatin (LIPITOR) 80 MG tablet TAKE 1 TABLET BY MOUTH AT BEDTIME.   clopidogrel (PLAVIX) 75 MG tablet TAKE 1 TABLET BY MOUTH ONCE DAILY.   diltiazem (CARDIZEM CD) 120 MG 24 hr capsule TAKE 1 CAPSULE DAILY.  isosorbide mononitrate (IMDUR) 30 MG 24 hr tablet TAKE ONE TABLET BY MOUTH ONCE DAILY.   metoprolol tartrate (LOPRESSOR) 25 MG tablet Take 1 tablet (25 mg total) by mouth 2 (two) times daily.   pantoprazole (PROTONIX) 40 MG tablet Take 1 tablet (40 mg total) by mouth 2 (two) times daily.   predniSONE (DELTASONE) 5 MG tablet Take 5 mg by mouth daily with breakfast. As needed foe knee pain     Allergies  Allergen Reactions   Lisinopril Swelling    H/o of angioedema    Chlorthalidone Other (See Comments)    Causes severe hyponatremia   Flexeril [Cyclobenzaprine]     Didn't feel right   Hyoscyamine Swelling   Prevacid  [Lansoprazole] Swelling   Valium [Diazepam] Other (See Comments)    "makes me feel crazy"   Potassium-Containing Compounds Palpitations    Social History   Socioeconomic History   Marital status: Married    Spouse name: Not on file   Number of children: 1   Years of education: Not on file   Highest education level: Not on file  Occupational History   Not on file  Tobacco Use   Smoking status: Never   Smokeless tobacco: Never  Vaping Use   Vaping Use: Never used  Substance and Sexual Activity   Alcohol use: No   Drug use: No   Sexual activity: Not Currently  Other Topics Concern   Not on file  Social History Narrative   ** Merged History Encounter **       Social Determinants of Health   Financial Resource Strain: Low Risk  (02/25/2018)   Overall Financial Resource Strain (CARDIA)    Difficulty of Paying Living Expenses: Not hard at all  Food Insecurity: No Food Insecurity (03/29/2022)   Hunger Vital Sign    Worried About Running Out of Food in the Last Year: Never true    Ran Out of Food in the Last Year: Never true  Transportation Needs: No Transportation Needs (03/29/2022)   PRAPARE - Administrator, Civil Service (Medical): No    Lack of Transportation (Non-Medical): No  Physical Activity: Insufficiently Active (02/25/2018)   Exercise Vital Sign    Days of Exercise per Week: 3 days    Minutes of Exercise per Session: 10 min  Stress: Stress Concern Present (02/25/2018)   [employer] of Occupational [employer] - Occupational Stress Questionnaire    Feeling of Stress : Rather much  Social Connections: Not on file  Intimate Partner Violence: Not At Risk (03/29/2022)   Humiliation, Afraid, Rape, and Kick questionnaire    Fear of Current or Ex-Partner: No    Emotionally Abused: No    Physically Abused: No    Sexually Abused: No     Review of Systems: General: negative for chills, fever, night sweats or weight changes.  Cardiovascular: negative for  chest pain, dyspnea on exertion, edema, orthopnea, palpitations, paroxysmal nocturnal dyspnea or shortness of breath Dermatological: negative for rash Respiratory: negative for cough or wheezing Urologic: negative for hematuria Abdominal: negative for nausea, vomiting, diarrhea, bright red blood per rectum, melena, or hematemesis Neurologic: negative for visual changes, syncope, or dizziness All other systems reviewed and are otherwise negative except as noted above.    Blood pressure 128/64, pulse 60, height  (1.727 m), weight 138 lb 9.6 oz (62.9 kg), SpO2 98 %.  General appearance: alert and no distress Neck: no adenopathy, no JVD, supple, symmetrical, trachea midline, thyroid not enlarged, symmetric,  no tenderness/mass/nodules, and soft right carotid bruit Lungs: clear to auscultation bilaterally Heart: regular rate and rhythm, S1, S2 normal, no murmur, click, rub or gallop Extremities: extremities normal, atraumatic, no cyanosis or edema Pulses: 2+ and symmetric Skin: Skin color, texture, turgor normal. No rashes or lesions Neurologic: Grossly normal  EKG not performed today  ASSESSMENT AND PLAN:   Dyslipidemia History of dyslipidemia on high-dose statin therapy with recent lipid profile performed 04/30/2022 revealing total cholesterol 150, LDL 88 and HDL of 50.  Essential hypertension History of essential hypertension blood pressure measured today at 128/64.  He is on amlodipine, diltiazem, and metoprolol.  PAF (paroxysmal atrial fibrillation) (HCC) History of PAF maintaining sinus rhythm on Eliquis oral anticoagulation.  CAD (coronary artery disease) History of CAD status post cardiac catheterization by myself 02/24/2018 revealing a high-grade mid RCA stenosis.  Unfortunately with initial balloon dilatation he had a spiral dissection requiring 4 overlapping Synergy drug-eluting stents.  Otherwise she did well.  He had a recent Myoview stress test 03/30/2022 which was low  risk and nonischemic as well as a normal 2D echo.     [name] MD FACP,FACC,FAHA, [facility] 07/04/2022 1:54 PM

## 2022-07-04 NOTE — Patient Instructions (Signed)
Medication Instructions:  Your physician recommends that you continue on your current medications as directed. Please refer to the Current Medication list given to you today.  *If you need a refill on your cardiac medications before your next appointment, please call your pharmacy*   Follow-Up: At [facility], you and your health needs are our priority.  As part of our continuing mission to provide you with exceptional heart care, we have created designated Provider Care Teams.  These Care Teams include your primary Cardiologist (physician) and Advanced Practice Providers (APPs -  Physician Assistants and Nurse Practitioners) who all work together to provide you with the care you need, when you need it.  We recommend signing up for the patient portal called "MyChart".  Sign up information is provided on this After Visit Summary.  MyChart is used to connect with patients for Virtual Visits (Telemedicine).  Patients are able to view lab/test results, encounter notes, upcoming appointments, etc.  Non-urgent messages can be sent to your provider as well.   To learn more about what you can do with MyChart, go to [email or web address].    Your next appointment:   12 month(s)  Provider:   Jonathan Berry, MD    

## 2022-07-09 ENCOUNTER — Telehealth (INDEPENDENT_AMBULATORY_CARE_PROVIDER_SITE_OTHER): Payer: Self-pay | Admitting: Gastroenterology

## 2022-07-09 ENCOUNTER — Encounter (INDEPENDENT_AMBULATORY_CARE_PROVIDER_SITE_OTHER): Payer: Self-pay | Admitting: Gastroenterology

## 2022-07-09 ENCOUNTER — Ambulatory Visit (INDEPENDENT_AMBULATORY_CARE_PROVIDER_SITE_OTHER): Payer: Medicare Other | Admitting: Gastroenterology

## 2022-07-09 VITALS — BP 162/78 | HR 56 | Temp 97.4°F | Ht 68.0 in | Wt 137.1 lb

## 2022-07-09 DIAGNOSIS — R1013 Epigastric pain: Secondary | ICD-10-CM | POA: Diagnosis not present

## 2022-07-09 DIAGNOSIS — K529 Noninfective gastroenteritis and colitis, unspecified: Secondary | ICD-10-CM | POA: Diagnosis not present

## 2022-07-09 DIAGNOSIS — R634 Abnormal weight loss: Secondary | ICD-10-CM

## 2022-07-09 DIAGNOSIS — K589 Irritable bowel syndrome without diarrhea: Secondary | ICD-10-CM

## 2022-07-09 MED ORDER — PANTOPRAZOLE SODIUM 40 MG PO TBEC: 40.0000 mg | DELAYED_RELEASE_TABLET | Freq: Every day | ORAL | 0 refills | Status: DC

## 2022-07-09 NOTE — Patient Instructions (Addendum)
Schedule CT abdomen/pelvis with IV contrast Start daily probiotics If having more than 4 bowel movements per day or presenting significant urgency to have a bowel movement, can take Imodium as needed If recurrent abdominal pain, restart Protonix 40 mg qday The patient was found to have elevated blood pressure when vital signs were checked in the office. The blood pressure was rechecked by the nursing staff and it was found be persistently elevated >140/90 mmHg. I personally advised to the patient to follow up closely with PCP for hypertension control. Start daily protein shakes (Ensure or Boost)

## 2022-07-09 NOTE — Telephone Encounter (Signed)
CT ABD/PELVIS W/CONTRAST ordered. PA attempted through [employer]. Per [employer]-in progess anticipated determination date 07/19/22. Order [id number].

## 2022-07-09 NOTE — Progress Notes (Signed)
[name], M.D. Gastroenterology & Hepatology [facility] [address] [place], [place] [address]  Primary Care Physician: Billie Lade, MD [address] [place] [address]  I will communicate my assessment and recommendations to the referring MD via EMR.  Problems: Epigastric abdominal pain, resolved IBS Chronic diarrhea with weight loss Candida esophagitis  History of Present Illness: [name] is a 77 y.o. male with past medical history of anxiety, atrial fibrillation, testicular cancer, GERD, hyperlipidemia, hypertension, IBS, who presents for follow up of abdominal pain and diarrhea.  The patient was last seen on 06/06/2022. At that time, the patient had GI pathogen panel and C. difficile testing checked which came back negative.  CBC, CMP and lipase which showed hemoglobin of 12.5 with other cell lines within normal limits, lipase was mildly elevated at 164 but less than 3 times the upper limit of normal.  He was advised to continue pantoprazole 40 mg twice daily.  Patient reports that his abdominal pain has resolved. He is only taking pantoprazole as needed as he feels diarrhea is worse when he takes the medication. He will like to have refills for Protonix in case the pain recurs.  Patient reports that he is still having some urge to have a bowel movement. States his stools are better compared to prior, as he has only been been presenting some liquidy Bms early in the morning.Believes he has lost 5 lb in the last 3 months, which has been concerning for him.  The patient denies having any nausea, vomiting, fever, chills, hematochezia, melena, hematemesis, abdominal distention, abdominal pain, jaundice, pruritus.   Last EGD January 2024: -White nummular lesions in the esophageal mucosa, Candida esophagitis (treated with fluconazole) -Normal stomach status post biopsy, chronic gastritis, H. pylori negative -Normal  duodenum   Last Colonoscopy:06/28/2021 There was presence of a small polyp in the splenic flexure, 4 polyps in the rectum, transverse colon and a 12 mm serrated polyp in the hepatic flexure.  All polyps were removed, the largest polyp was removed with a hot snare, tattoo was placed next to this polypectomy..  Scar was noted in the mid sigmoid from previous polypectomy.   A. COLON, SPLENIC FLEXURE, HEPATIC FLEXURE, RECTUM, CECAL, TRANSVERSE,  POLYPECTOMY:  Tubular adenomas, a sessile serrated polyp without cytologic dysplasia,  hyperplastic polyp and a benign hamartomatous polyp with.  Negative for high-grade dysplasia.   B. COLON, HEPATIC FLEXURE, POLYPECTOMY:  Inflammatory pseudopolyp (polypoid granulation tissue with ulceration of  the overlying epithelium) with an associated hyperplastic polyp.  Negative for dysplasia and malignancy.    Recommended repeat colonoscopy in 5 years  Past Medical History: Past Medical History:  Diagnosis Date   Anxiety    Atrial fibrillation    Cancer    testicular    Chest pain    myoview 07/09/12-normal, nl ef; echo 04/12/05-ef>55%, mild aortic sclerosis   GERD (gastroesophageal reflux disease)    H. pylori infection    Headache    Hypercholesteremia    Hyperlipidemia    Hypertension    Hyponatremia 2013   Palpitations    event monior 04/11/05- SR with occ PAC   Personal history of colonic polyps 2 9 2007    Past Surgical History: Past Surgical History:  Procedure Laterality Date   BACK SURGERY     BIOPSY  04/17/2022   Procedure: BIOPSY;  Surgeon: Dolores Frame, MD;  [facility]: [facility];  Service: Gastroenterology;;   CATARACT EXTRACTION  W/PHACO Left 12/08/2012   Procedure: CATARACT EXTRACTION PHACO AND INTRAOCULAR LENS PLACEMENT (IOC);  Surgeon: Gemma Payor, MD;  Location: [facility];  Service: Ophthalmology;  Laterality: Left;  CDE:  23.19   CATARACT EXTRACTION W/PHACO Right 04/15/2014   Procedure: CATARACT EXTRACTION PHACO AND  INTRAOCULAR LENS PLACEMENT RIGHT;  Surgeon: Gemma Payor, MD;  Location: [facility];  Service: Ophthalmology;  Laterality: Right;  CDE:10.32   COLONOSCOPY  12/06/2010   Procedure: COLONOSCOPY;  Surgeon: Malissa Hippo, MD;  [facility]: [facility];  Service: Endoscopy;  Laterality: N/A;   COLONOSCOPY N/A 05/23/2016   Procedure: COLONOSCOPY;  Surgeon: Malissa Hippo, MD;  [facility];  Service: Endoscopy;  Laterality: N/A;  930   COLONOSCOPY WITH PROPOFOL N/A 06/28/2021   Procedure: COLONOSCOPY WITH PROPOFOL;  Surgeon: Malissa Hippo, MD;  [facility]: [facility];  Service: Endoscopy;  Laterality: N/A;  1045   CORONARY STENT INTERVENTION N/A 02/24/2018   Procedure: CORONARY STENT INTERVENTION;  Surgeon: [name], MD;  [facility];  Service: Cardiovascular;  Laterality: N/A;   ESOPHAGOGASTRODUODENOSCOPY N/A 07/20/2015   Procedure: ESOPHAGOGASTRODUODENOSCOPY (EGD);  Surgeon: Malissa Hippo, MD;  [facility];  Service: Endoscopy;  Laterality: N/A;  2:55 - moved to 1:55 - Ann notified pt   ESOPHAGOGASTRODUODENOSCOPY (EGD) WITH PROPOFOL N/A 04/17/2022   Procedure: ESOPHAGOGASTRODUODENOSCOPY (EGD) WITH PROPOFOL;  Surgeon: Dolores Frame, MD;  [facility];  Service: Gastroenterology;  Laterality: N/A;  215pm, asa 3   EYE SURGERY     LEFT HEART CATH AND CORONARY ANGIOGRAPHY N/A 02/24/2018   Procedure: LEFT HEART CATH AND CORONARY ANGIOGRAPHY;  Surgeon: [name], MD;  [facility];  Service: Cardiovascular;  Laterality: N/A;   LUMBAR DISC SURGERY Left 2007   "L4-5 ruptured discs"   POLYPECTOMY  05/23/2016   Procedure: POLYPECTOMY;  Surgeon: Malissa Hippo, MD;  [facility];  Service: Endoscopy;;  colon   POLYPECTOMY  06/28/2021   Procedure: POLYPECTOMY;  Surgeon: Malissa Hippo, MD;  [facility];  Service: Endoscopy;;  splenic flexure;hepatic flexurex2; cecal polyp;transverse colon polyp;rectal  polyp;    Family History: Family History  Problem Relation Age of Onset   Hypertension Father    Hyperlipidemia Father    CAD Father        H/O CABG    Social History: Social History   Tobacco Use  Smoking Status Never   Passive exposure: Past  Smokeless Tobacco Never   Social History   Substance and Sexual Activity  Alcohol Use No   Social History   Substance and Sexual Activity  Drug Use No    Allergies: Allergies  Allergen Reactions   Lisinopril Swelling    H/o of angioedema    Chlorthalidone Other (See Comments)    Causes severe hyponatremia   Flexeril [Cyclobenzaprine]     Didn't feel right   Hyoscyamine Swelling   Prevacid [Lansoprazole] Swelling   Valium [Diazepam] Other (See Comments)    "makes me feel crazy"   Potassium-Containing Compounds Palpitations    Medications: Current Outpatient Medications  Medication Sig Dispense Refill   acetaminophen (TYLENOL) 500 MG tablet Take 250 mg by mouth 2 (two) times daily as needed for moderate pain or headache.     ALPRAZolam (XANAX) 0.5 MG tablet Take 0.5 mg by mouth 3 (three) times daily as needed for anxiety.     amLODipine (NORVASC) 5 MG tablet Take 1 tablet (5  mg total) by mouth daily. 90 tablet 3   apixaban (ELIQUIS) 5 MG TABS tablet Take 1 tablet (5 mg total) by mouth 2 (two) times daily. 180 tablet 3   atorvastatin (LIPITOR) 80 MG tablet Take 1 tablet (80 mg total) by mouth at bedtime. 90 tablet 3   clopidogrel (PLAVIX) 75 MG tablet Take 1 tablet (75 mg total) by mouth daily. 90 tablet 3   diltiazem (CARDIZEM CD) 120 MG 24 hr capsule Take 1 capsule (120 mg total) by mouth daily. 90 capsule 3   isosorbide mononitrate (IMDUR) 30 MG 24 hr tablet Take 1 tablet (30 mg total) by mouth daily. 90 tablet 3   metoprolol tartrate (LOPRESSOR) 25 MG tablet Take 1 tablet (25 mg total) by mouth 2 (two) times daily. 180 tablet 3   pantoprazole (PROTONIX) 40 MG tablet Take 1 tablet (40 mg total) by mouth 2 (two) times  daily. 180 tablet 3   predniSONE (DELTASONE) 5 MG tablet Take 5 mg by mouth daily with breakfast. As needed foe knee pain     No current facility-administered medications for this visit.    Review of Systems: GENERAL: negative for malaise, night sweats HEENT: No changes in hearing or vision, no nose bleeds or other nasal problems. NECK: Negative for lumps, goiter, pain and significant neck swelling RESPIRATORY: Negative for cough, wheezing CARDIOVASCULAR: Negative for chest pain, leg swelling, palpitations, orthopnea GI: SEE HPI MUSCULOSKELETAL: Negative for joint pain or swelling, back pain, and muscle pain. SKIN: Negative for lesions, rash PSYCH: Negative for sleep disturbance, mood disorder and recent psychosocial stressors. HEMATOLOGY Negative for prolonged bleeding, bruising easily, and swollen nodes. ENDOCRINE: Negative for cold or heat intolerance, polyuria, polydipsia and goiter. NEURO: negative for tremor, gait imbalance, syncope and seizures. The remainder of the review of systems is noncontributory.   Physical Exam: BP (!) 173/81 (BP Location: Left Arm, Patient Position: Sitting, Cuff Size: Normal)   Pulse (!) 56   Temp (!) 97.4 F (36.3 C) (Oral)   Ht 5\' 8"  (1.727 m)   Wt 137 lb 1.6 oz (62.2 kg)   BMI 20.85 kg/m  GENERAL: The patient is AO x3, in no acute distress. HEENT: Head is normocephalic and atraumatic. EOMI are intact. Mouth is well hydrated and without lesions. NECK: Supple. No masses LUNGS: Clear to auscultation. No presence of rhonchi/wheezing/rales. Adequate chest expansion HEART: RRR, normal s1 and s2. ABDOMEN: Soft, nontender, no guarding, no peritoneal signs, and nondistended. BS +. No masses. EXTREMITIES: Without any cyanosis, clubbing, rash, lesions or edema. NEUROLOGIC: AOx3, no focal motor deficit. SKIN: no jaundice, no rashes  Imaging/Labs: as above  I personally reviewed and interpreted the available labs, imaging and endoscopic  files.  Impression and Plan: [name] is a 77 y.o. male with past medical history of anxiety, atrial fibrillation, testicular cancer, GERD, hyperlipidemia, hypertension, IBS, who presents for follow up of abdominal pain and diarrhea.  Patient has presented resolution of his episode of epigastric pain after finishing his course of fluconazole and taking PPI for short period of time.  He does have any abdominal complaints at the moment.  Given the fact that pantoprazole worsened his diarrhea, I would advise him to only take it as needed for now.  Patient has persisted with episodes of diarrhea, which fortunately have slowed down in severity.  However, he has presented significant weight loss which has been concerning.  Due to this, we will evaluate this further with a CT of the abdomen pelvis with IV  contrast, which he is agreeable to proceed with.  For now, he can take Imodium as needed if he presents significant diarrhea and he can try protein shakes to increase his muscle mass.  The patient was found to have elevated blood pressure when vital signs were checked in the office. The blood pressure was rechecked by the nursing staff and it was found be persistently elevated >140/90 mmHg. I personally advised to the patient to follow up closely with PCP for hypertension control.  -Schedule CT abdomen/pelvis with IV contrast - Start daily probiotics - If having more than 4 bowel movements per day or presenting significant urgency to have a bowel movement, can take Imodium as needed - If recurrent abdominal pain, restart Protonix 40 mg qday - Start daily protein shakes (Ensure or Boost)  All questions were answered.      [name], MD Gastroenterology and Hepatology [facility]

## 2022-07-17 NOTE — Telephone Encounter (Signed)
Pt wife called back and states she has rescheduled CT to 07/23/22 at 1 pm

## 2022-07-17 NOTE — Telephone Encounter (Signed)
Approval via [employer] number [id number] Approval valid through 07/09/22-08/07/22  Pt scheduled at [facility] for 08/01/22 @ 6 pm. Arrive 15 minutes early. [name] has no availability until June Contacted wife [name] to inform her of appt. Wife states she does not like to go out that late in the evening. Informed wife to contact [facility] Sched[facility] to change time. Gave pt wife number to [employer]

## 2022-07-23 ENCOUNTER — Ambulatory Visit (HOSPITAL_BASED_OUTPATIENT_CLINIC_OR_DEPARTMENT_OTHER)
Admission: RE | Admit: 2022-07-23 | Discharge: 2022-07-23 | Disposition: A | Discharge status: Home / Self Care | Payer: Medicare Other | Source: Ambulatory Visit | Attending: Gastroenterology | Admitting: Gastroenterology

## 2022-07-23 ENCOUNTER — Telehealth (INDEPENDENT_AMBULATORY_CARE_PROVIDER_SITE_OTHER): Payer: Self-pay | Admitting: Gastroenterology

## 2022-07-23 DIAGNOSIS — N289 Disorder of kidney and ureter, unspecified: Secondary | ICD-10-CM | POA: Diagnosis not present

## 2022-07-23 DIAGNOSIS — K529 Noninfective gastroenteritis and colitis, unspecified: Secondary | ICD-10-CM | POA: Diagnosis not present

## 2022-07-23 DIAGNOSIS — R634 Abnormal weight loss: Secondary | ICD-10-CM | POA: Diagnosis not present

## 2022-07-23 MED ORDER — IOHEXOL 300 MG/ML  SOLN
100.0000 mL | Freq: Once | INTRAMUSCULAR | Status: AC | PRN
  Administered 2022-07-23: 75 mL via INTRAVENOUS

## 2022-07-23 NOTE — Telephone Encounter (Signed)
Pt left voicemail in regards to being able to take medication before having CT done today at 1 pm. Attempted to reach pt but no answer; left message to return call

## 2022-07-24 ENCOUNTER — Telehealth: Payer: Self-pay | Admitting: Internal Medicine

## 2022-07-24 DIAGNOSIS — I2510 Atherosclerotic heart disease of native coronary artery without angina pectoris: Secondary | ICD-10-CM

## 2022-07-24 DIAGNOSIS — E785 Hyperlipidemia, unspecified: Secondary | ICD-10-CM

## 2022-07-24 MED ORDER — ROSUVASTATIN CALCIUM 40 MG PO TABS
40.0000 mg | ORAL_TABLET | Freq: Every day | ORAL | 3 refills | Status: DC
Start: 2022-07-24 — End: 2023-02-21

## 2022-07-24 NOTE — Telephone Encounter (Signed)
Patient advised.

## 2022-07-24 NOTE — Telephone Encounter (Signed)
Pt called and said he's been having headaches, and would like to try a different Cholesterol med.

## 2022-07-30 DIAGNOSIS — R7982 Elevated C-reactive protein (CRP): Secondary | ICD-10-CM | POA: Diagnosis not present

## 2022-07-30 DIAGNOSIS — M79671 Pain in right foot: Secondary | ICD-10-CM | POA: Diagnosis not present

## 2022-07-30 DIAGNOSIS — M1991 Primary osteoarthritis, unspecified site: Secondary | ICD-10-CM | POA: Diagnosis not present

## 2022-07-30 DIAGNOSIS — M2540 Effusion, unspecified joint: Secondary | ICD-10-CM | POA: Diagnosis not present

## 2022-08-01 ENCOUNTER — Telehealth: Payer: Self-pay | Admitting: Internal Medicine

## 2022-08-01 ENCOUNTER — Other Ambulatory Visit (HOSPITAL_BASED_OUTPATIENT_CLINIC_OR_DEPARTMENT_OTHER): Payer: Medicare Other

## 2022-08-01 MED ORDER — PREDNISONE 5 MG PO TABS: 5.0000 mg | ORAL_TABLET | Freq: Every day | ORAL | 0 refills | Status: DC | PRN

## 2022-08-01 NOTE — Telephone Encounter (Signed)
Patient wife aware

## 2022-08-01 NOTE — Telephone Encounter (Signed)
Pt called and requested refill. He stated he ran out, and his knee and back is hurting.    predniSONE (DELTASONE) 5 MG tablet [[id number]

## 2022-08-01 NOTE — Telephone Encounter (Signed)
Pt stated that the doctor does not control this medication anymore. The doctor that was controlling the prednisone pt no longer sees them. Please follow up.

## 2022-08-10 ENCOUNTER — Other Ambulatory Visit: Payer: Self-pay | Admitting: Cardiovascular Disease

## 2022-08-20 ENCOUNTER — Ambulatory Visit: Payer: Medicare Other | Admitting: Internal Medicine

## 2022-08-29 ENCOUNTER — Encounter (INDEPENDENT_AMBULATORY_CARE_PROVIDER_SITE_OTHER): Payer: Self-pay | Admitting: Gastroenterology

## 2022-09-25 ENCOUNTER — Emergency Department (HOSPITAL_COMMUNITY): Payer: Medicare Other

## 2022-09-25 ENCOUNTER — Emergency Department (HOSPITAL_COMMUNITY)
Admission: EM | Admit: 2022-09-25 | Discharge: 2022-09-25 | Disposition: A | Discharge status: Home / Self Care | Payer: Medicare Other | Source: Home / Self Care | Attending: Student | Admitting: Student

## 2022-09-25 ENCOUNTER — Other Ambulatory Visit: Payer: Self-pay

## 2022-09-25 ENCOUNTER — Encounter (HOSPITAL_COMMUNITY): Payer: Self-pay | Admitting: Emergency Medicine

## 2022-09-25 DIAGNOSIS — Z7901 Long term (current) use of anticoagulants: Secondary | ICD-10-CM | POA: Insufficient documentation

## 2022-09-25 DIAGNOSIS — I10 Essential (primary) hypertension: Secondary | ICD-10-CM | POA: Insufficient documentation

## 2022-09-25 DIAGNOSIS — R29818 Other symptoms and signs involving the nervous system: Secondary | ICD-10-CM | POA: Diagnosis not present

## 2022-09-25 DIAGNOSIS — Z79899 Other long term (current) drug therapy: Secondary | ICD-10-CM | POA: Insufficient documentation

## 2022-09-25 DIAGNOSIS — Z7902 Long term (current) use of antithrombotics/antiplatelets: Secondary | ICD-10-CM | POA: Diagnosis not present

## 2022-09-25 DIAGNOSIS — R42 Dizziness and giddiness: Secondary | ICD-10-CM | POA: Insufficient documentation

## 2022-09-25 DIAGNOSIS — R519 Headache, unspecified: Secondary | ICD-10-CM | POA: Insufficient documentation

## 2022-09-25 DIAGNOSIS — I6782 Cerebral ischemia: Secondary | ICD-10-CM | POA: Diagnosis not present

## 2022-09-25 LAB — URINALYSIS, ROUTINE W REFLEX MICROSCOPIC
Bacteria, UA: NONE SEEN
Bilirubin Urine: NEGATIVE
Glucose, UA: NEGATIVE mg/dL
Ketones, ur: NEGATIVE mg/dL
Leukocytes,Ua: NEGATIVE
Nitrite: NEGATIVE
Protein, ur: NEGATIVE mg/dL
Specific Gravity, Urine: 1.006 (ref 1.005–1.030)
pH: 6 (ref 5.0–8.0)

## 2022-09-25 LAB — CBC
HCT: 40 % (ref 39.0–52.0)
Hemoglobin: 13.2 g/dL (ref 13.0–17.0)
MCH: 29.8 pg (ref 26.0–34.0)
MCHC: 33 g/dL (ref 30.0–36.0)
MCV: 90.3 fL (ref 80.0–100.0)
Platelets: 281 10*3/uL (ref 150–400)
RBC: 4.43 MIL/uL (ref 4.22–5.81)
RDW: 13.2 % (ref 11.5–15.5)
WBC: 9 10*3/uL (ref 4.0–10.5)
nRBC: 0 % (ref 0.0–0.2)

## 2022-09-25 LAB — BASIC METABOLIC PANEL
Anion gap: 10 (ref 5–15)
BUN: 16 mg/dL (ref 8–23)
CO2: 24 mmol/L (ref 22–32)
Calcium: 9 mg/dL (ref 8.9–10.3)
Chloride: 99 mmol/L (ref 98–111)
Creatinine, Ser: 0.91 mg/dL (ref 0.61–1.24)
GFR, Estimated: >60 mL/min (ref >60)
Glucose, Bld: 128 mg/dL — ABNORMAL HIGH (ref 70–99)
Potassium: 3.9 mmol/L (ref 3.5–5.1)
Sodium: 133 mmol/L — ABNORMAL LOW (ref 135–145)

## 2022-09-25 LAB — CBG MONITORING, ED: Glucose-Capillary: 131 mg/dL — ABNORMAL HIGH (ref 70–99)

## 2022-09-25 LAB — PROTIME-INR
INR: 1.1 (ref 0.8–1.2)
Prothrombin Time: 14.5 seconds (ref 11.4–15.2)

## 2022-09-25 MED ORDER — ALPRAZOLAM 0.5 MG PO TABS
0.5000 mg | ORAL_TABLET | Freq: Once | ORAL | Status: AC
  Administered 2022-09-25: 0.5 mg via ORAL
  Filled 2022-09-25: qty 1

## 2022-09-25 NOTE — Discharge Instructions (Addendum)
It was a pleasure taking care of you today.  Your blood work was reassuring.  If you have no sign of UTI, your kidney function is normal, you are not anemic.  The CT scan and MRI of your brain were normal, showing no signs of stroke.  Your EKG did not show signs of heart attack.  I am not sure of the cause of your dizziness.  It is important for you to follow-up with your primary care doctor.
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
## 2022-09-25 NOTE — ED Triage Notes (Signed)
Pt via POV c/o "funny feeling" in his head x 2 weeks with episodes since Sunday where he has felt very dizzy with changes to equilibrium, even when seated. He notes that these episodes have also included some brief visual changes (flashing lights, etc) which resolved. Hx a fib; denies hx vertigo. No recent changes to medications or medical history. A/O x 4, no neuro deficit noted

## 2022-09-26 ENCOUNTER — Telehealth: Payer: Self-pay

## 2022-09-26 NOTE — Transitions of Care (Post Inpatient/ED Visit) (Unsigned)
   09/26/2022  Name: [name] MRN: [id number] DOB: 08-14-1945  Today's TOC FU Call Status: Today's TOC FU Call Status:: Unsuccessul Call (1st Attempt) Unsuccessful Call (1st Attempt) Date: 09/26/22  Attempted to reach the patient regarding the most recent Inpatient/ED visit.  Follow Up Plan: Additional outreach attempts will be made to reach the patient to complete the Transitions of Care (Post Inpatient/ED visit) call.   Signature   Woodfin Ganja LPN [facility] Nurse Health Advisor Direct Dial [phone]

## 2022-09-27 NOTE — Transitions of Care (Post Inpatient/ED Visit) (Signed)
09/27/2022  Name: [name] MRN: [id number] DOB: 11-18-1945  Today's TOC FU Call Status: Today's TOC FU Call Status:: Successful TOC FU Call Competed Unsuccessful Call (1st Attempt) Date: 09/26/22 [facility] FU Call Complete Date: 09/27/22  Transition Care Management Follow-up Telephone Call Date of Discharge: 09/25/22 [facility] Type of Discharge: Emergency Department Reason for ED Visit: Other: (dizziness) How have you been since you were released from the hospital?: Better Any questions or concerns?: No  Items Reviewed: Did you receive and understand the discharge instructions provided?: Yes Medications obtained,verified, and reconciled?: Yes (Medications Reviewed) Any new allergies since your discharge?: No Dietary orders reviewed?: Yes Do you have support at home?: Yes  Medications Reviewed Today: Medications Reviewed Today     Reviewed by Merleen Nicely, LPN (Licensed Practical Nurse) on 09/27/22 at 1052  Med List Status: <None>   Medication Order Taking? Sig Documenting Provider Last Dose Status Informant  acetaminophen (TYLENOL) 500 MG tablet [id number] Yes Take 250 mg by mouth 2 (two) times daily as needed for moderate pain or headache. [provider] Taking Active Self, Pharmacy Records, Spouse/Significant Other  ALPRAZolam (XANAX) 0.5 MG tablet [id number] Yes Take 0.5 mg by mouth 3 (three) times daily as needed for anxiety. [provider] Taking Active Self, Pharmacy Records, Spouse/Significant Other           Med Note Mayford Knife, Felton Clinton Mar 29, 2022  8:36 PM) Pt takes 1/2 tablet three times a day as needed for anxiety.  amLODipine (NORVASC) 5 MG tablet [id number] Yes Take 1 tablet (5 mg total) by mouth daily. [name], MD Taking Active   apixaban (ELIQUIS) 5 MG TABS tablet [id number] Yes Take 1 tablet (5 mg total) by mouth 2 (two) times daily. [name], MD Taking Active   atorvastatin (LIPITOR) 80 MG  tablet [id number] Yes TAKE 1 TABLET BY MOUTH AT BEDTIME. [name], MD Taking Active   clopidogrel (PLAVIX) 75 MG tablet [id number] Yes Take 1 tablet (75 mg total) by mouth daily. [name], MD Taking Active   diltiazem (CARDIZEM CD) 120 MG 24 hr capsule [id number] Yes Take 1 capsule (120 mg total) by mouth daily. [name], MD Taking Active   isosorbide mononitrate (IMDUR) 30 MG 24 hr tablet [id number] Yes Take 1 tablet (30 mg total) by mouth daily. [name], MD Taking Active   metoprolol tartrate (LOPRESSOR) 25 MG tablet [id number] Yes Take 1 tablet (25 mg total) by mouth 2 (two) times daily. [name], MD Taking Active   pantoprazole (PROTONIX) 40 MG tablet [id number] Yes Take 1 tablet (40 mg total) by mouth daily. Marguerita Merles, Reuel Boom, MD Taking Active   predniSONE (DELTASONE) 5 MG tablet [id number] Yes Take 1 tablet (5 mg total) by mouth daily as needed (as needed for knee pain). As needed for knee pain Billie Lade, MD Taking Active   rosuvastatin (CRESTOR) 40 MG tablet [id number] Yes Take 1 tablet (40 mg total) by mouth daily. Billie Lade, MD Taking Active             Home Care and Equipment/Supplies: Were Home Health Services Ordered?: NA Any new equipment or medical supplies ordered?: NA  Functional Questionnaire: Do you need assistance with bathing/showering or dressing?: No Do you need assistance with meal preparation?: No Do you need assistance with eating?: No Do you have difficulty maintaining continence: No Do you need assistance with getting out of bed/getting  out of a chair/moving?: No Do you have difficulty managing or taking your medications?: No  Follow up appointments reviewed: PCP Follow-up appointment confirmed?: No (declined) MD Provider Line Number:[phone] Given: Yes Specialist Hospital Follow-up appointment confirmed?: No Do you need transportation to your follow-up appointment?: No Do you understand care  options if your condition(s) worsen?: Yes-patient verbalized understanding    SIGNATURE  Woodfin Ganja LPN [facility] Nurse Health Advisor Direct Dial [phone]

## 2022-10-04 ENCOUNTER — Telehealth: Payer: Self-pay | Admitting: Cardiovascular Disease

## 2022-10-04 NOTE — Telephone Encounter (Signed)
Left voicemail message to call the office

## 2022-10-04 NOTE — Telephone Encounter (Signed)
Patient returned RN's call. 

## 2022-10-04 NOTE — Telephone Encounter (Signed)
STAT if patient feels like he/she is going to faint   Are you dizzy now? No  Do you feel faint or have you passed out? No  Do you have any other symptoms? No  Have you checked your HR and BP (record if available)? HR-60

## 2022-10-04 NOTE — Telephone Encounter (Signed)
Patient states he has had dizzy spells. States for approx  several weeks. Went to ED. Multiple test done  Advised BP log . Taking before meds and 2 hours after BP meds  7/18 this afternoon 160/75 HR 62  144/69  HR 59 No other readings consistent.  He ask cause of dizziness.  Advised would see based on ed visit does he need to make any changes prior to an appt. Or schedule and bring BP log in

## 2022-10-09 ENCOUNTER — Ambulatory Visit: Payer: Medicare Other | Admitting: Internal Medicine

## 2022-10-10 NOTE — Telephone Encounter (Signed)
Spoke with patient regarding message. Patient BP is running in the 130's or 140 at the highest.  HR in 60's. He discusses taking meds with food and trying to change his daily habits and routine.  Advised again to see PCP for F/U and he states understanding

## 2022-10-15 ENCOUNTER — Ambulatory Visit (INDEPENDENT_AMBULATORY_CARE_PROVIDER_SITE_OTHER): Payer: Medicare Other | Admitting: Gastroenterology

## 2022-10-15 ENCOUNTER — Encounter (INDEPENDENT_AMBULATORY_CARE_PROVIDER_SITE_OTHER): Payer: Self-pay

## 2022-10-26 ENCOUNTER — Encounter: Payer: Self-pay | Admitting: Internal Medicine

## 2022-10-26 ENCOUNTER — Ambulatory Visit (INDEPENDENT_AMBULATORY_CARE_PROVIDER_SITE_OTHER): Payer: Medicare Other | Admitting: Internal Medicine

## 2022-10-26 VITALS — BP 126/70 | HR 60 | Ht 68.0 in | Wt 140.4 lb

## 2022-10-26 DIAGNOSIS — Z125 Encounter for screening for malignant neoplasm of prostate: Secondary | ICD-10-CM | POA: Diagnosis not present

## 2022-10-26 DIAGNOSIS — Z1329 Encounter for screening for other suspected endocrine disorder: Secondary | ICD-10-CM | POA: Diagnosis not present

## 2022-10-26 DIAGNOSIS — I2510 Atherosclerotic heart disease of native coronary artery without angina pectoris: Secondary | ICD-10-CM

## 2022-10-26 DIAGNOSIS — F419 Anxiety disorder, unspecified: Secondary | ICD-10-CM

## 2022-10-26 DIAGNOSIS — I10 Essential (primary) hypertension: Secondary | ICD-10-CM

## 2022-10-26 DIAGNOSIS — Z131 Encounter for screening for diabetes mellitus: Secondary | ICD-10-CM

## 2022-10-26 DIAGNOSIS — M7918 Myalgia, other site: Secondary | ICD-10-CM

## 2022-10-26 DIAGNOSIS — K219 Gastro-esophageal reflux disease without esophagitis: Secondary | ICD-10-CM | POA: Diagnosis not present

## 2022-10-26 DIAGNOSIS — E785 Hyperlipidemia, unspecified: Secondary | ICD-10-CM | POA: Diagnosis not present

## 2022-10-26 DIAGNOSIS — Z1321 Encounter for screening for nutritional disorder: Secondary | ICD-10-CM

## 2022-10-26 DIAGNOSIS — G8929 Other chronic pain: Secondary | ICD-10-CM

## 2022-10-26 MED ORDER — ALPRAZOLAM 0.5 MG PO TABS
0.5000 mg | ORAL_TABLET | Freq: Three times a day (TID) | ORAL | 0 refills | Status: AC | PRN
Start: 2022-10-26 — End: ?

## 2022-10-26 MED ORDER — PREDNISONE 5 MG PO TABS: 5.0000 mg | ORAL_TABLET | Freq: Every day | ORAL | 0 refills | Status: DC | PRN

## 2022-10-26 NOTE — Patient Instructions (Signed)
It was a pleasure to see you today.  Thank you for giving [name] the opportunity to be involved in your care.  Below is a brief recap of your visit and next steps.  We will plan to see you again in 6 months.  Summary No medication changes today Prednisone and Xanax refilled Repeat labs ordered Follow up in 6 months

## 2022-10-26 NOTE — Progress Notes (Signed)
Established Patient Office Visit  Subjective   Patient ID: [name], male    DOB: 1945-03-24  Age: 77 y.o. MRN: [id number]  Chief Complaint  Patient presents with   Hypertension    Follow up    [name] returns to care today for routine follow-up.  He was last evaluated by me on 4/9 as a new patient presenting to establish care no medication changes were made at that time and 42-month follow-up was arranged.  In the interim, he has been evaluated by cardiology and gastroenterology for follow-up.  He also presented to the emergency department on 7/9 endorsing dizziness.  Workup was reassuring.  He was ultimately discharged home and recommended to follow-up with his PCP.  There have otherwise been no acute interval events.  Today [name] states that he feels well.  He is asymptomatic and has no acute concerns to discuss.  Dizziness has resolved.  Past Medical History:  Diagnosis Date   Anxiety    Atrial fibrillation (HCC)    Cancer (HCC)    testicular    Chest pain    myoview 07/09/12-normal, nl ef; echo 04/12/05-ef>55%, mild aortic sclerosis   GERD (gastroesophageal reflux disease)    H. pylori infection    Headache    Hypercholesteremia    Hyperlipidemia    Hypertension    Hyponatremia 2013   Palpitations    event monior 04/11/05- SR with occ PAC   Personal history of colonic polyps 2 9 2007   Past Surgical History:  Procedure Laterality Date   BACK SURGERY     BIOPSY  04/17/2022   Procedure: BIOPSY;  Surgeon: Dolores Frame, MD;  [facility];  Service: Gastroenterology;;   CATARACT EXTRACTION W/PHACO Left 12/08/2012   Procedure: CATARACT EXTRACTION PHACO AND INTRAOCULAR LENS PLACEMENT (IOC);  Surgeon: Gemma Payor, MD;  Location: [facility];  Service: Ophthalmology;  Laterality: Left;  CDE:  23.19   CATARACT EXTRACTION W/PHACO Right 04/15/2014   Procedure: CATARACT EXTRACTION PHACO AND INTRAOCULAR LENS PLACEMENT RIGHT;  Surgeon: Gemma Payor, MD;  [facility];  Service: Ophthalmology;  Laterality: Right;  CDE:10.32   COLONOSCOPY  12/06/2010   Procedure: COLONOSCOPY;  Surgeon: Malissa Hippo, MD;  [facility];  Service: Endoscopy;  Laterality: N/A;   COLONOSCOPY N/A 05/23/2016   Procedure: COLONOSCOPY;  Surgeon: Malissa Hippo, MD;  [facility];  Service: Endoscopy;  Laterality: N/A;  930   COLONOSCOPY WITH PROPOFOL N/A 06/28/2021   Procedure: COLONOSCOPY WITH PROPOFOL;  Surgeon: Malissa Hippo, MD;  [facility];  Service: Endoscopy;  Laterality: N/A;  1045   CORONARY STENT INTERVENTION N/A 02/24/2018   Procedure: CORONARY STENT INTERVENTION;  Surgeon: [name], MD;  [facility];  Service: Cardiovascular;  Laterality: N/A;   ESOPHAGOGASTRODUODENOSCOPY N/A 07/20/2015   Procedure: ESOPHAGOGASTRODUODENOSCOPY (EGD);  Surgeon: Malissa Hippo, MD;  [facility];  Service: Endoscopy;  Laterality: N/A;  2:55 - moved to 1:55 - Ann notified pt   ESOPHAGOGASTRODUODENOSCOPY (EGD) WITH PROPOFOL N/A 04/17/2022   Procedure: ESOPHAGOGASTRODUODENOSCOPY (EGD) WITH PROPOFOL;  Surgeon: Dolores Frame, MD;  [facility];  Service: Gastroenterology;  Laterality: N/A;  215pm, asa 3   EYE SURGERY     LEFT HEART CATH AND CORONARY ANGIOGRAPHY N/A 02/24/2018   Procedure: LEFT HEART CATH AND CORONARY ANGIOGRAPHY;  Surgeon: [name], MD;  [facility];  Service: Cardiovascular;  Laterality:  N/A;   LUMBAR DISC SURGERY Left 2007   "L4-5 ruptured discs"   POLYPECTOMY  05/23/2016   Procedure: POLYPECTOMY;  Surgeon: Malissa Hippo, MD;  [facility];  Service: Endoscopy;;  colon   POLYPECTOMY  06/28/2021   Procedure: POLYPECTOMY;  Surgeon: Malissa Hippo, MD;  [facility];  Service: Endoscopy;;  splenic flexure;hepatic flexurex2; cecal polyp;transverse colon polyp;rectal polyp;   Social History   Tobacco Use   Smoking status: Never     Passive exposure: Past   Smokeless tobacco: Never  Vaping Use   Vaping status: Never Used  Substance Use Topics   Alcohol use: No   Drug use: No   Family History  Problem Relation Age of Onset   Hypertension Father    Hyperlipidemia Father    CAD Father        H/O CABG   Allergies  Allergen Reactions   Lisinopril Swelling    H/o of angioedema    Chlorthalidone Other (See Comments)    Causes severe hyponatremia   Flexeril [Cyclobenzaprine]     Didn't feel right   Hyoscyamine Swelling   Prevacid [Lansoprazole] Swelling   Valium [Diazepam] Other (See Comments)    "makes me feel crazy"   Potassium-Containing Compounds Palpitations   Review of Systems  Constitutional:  Negative for chills and fever.  HENT:  Negative for sore throat.   Respiratory:  Negative for cough and shortness of breath.   Cardiovascular:  Negative for chest pain, palpitations and leg swelling.  Gastrointestinal:  Negative for abdominal pain, blood in stool, constipation, diarrhea, nausea and vomiting.  Genitourinary:  Negative for dysuria and hematuria.  Musculoskeletal:  Negative for myalgias.  Skin:  Negative for itching and rash.  Neurological:  Negative for dizziness and headaches.  Psychiatric/Behavioral:  Negative for depression and suicidal ideas.      Objective:     BP 126/70   Pulse 60   Ht 5\' 8"  (1.727 m)   Wt 140 lb 6.4 oz (63.7 kg)   SpO2 95%   BMI 21.35 kg/m  BP Readings from Last 3 Encounters:  10/26/22 126/70  09/25/22 (!) 165/70  07/09/22 (!) 162/78   Physical Exam Vitals reviewed.  Constitutional:      General: He is not in acute distress.    Appearance: Normal appearance. He is not ill-appearing.  HENT:     Head: Normocephalic and atraumatic.     Right Ear: External ear normal.     Left Ear: External ear normal.     Nose: Nose normal. No congestion or rhinorrhea.     Mouth/Throat:     Mouth: Mucous membranes are moist.     Pharynx: Oropharynx is clear.  Eyes:      General: No scleral icterus.    Extraocular Movements: Extraocular movements intact.     Conjunctiva/sclera: Conjunctivae normal.     Pupils: Pupils are equal, round, and reactive to light.  Cardiovascular:     Rate and Rhythm: Normal rate and regular rhythm.     Pulses: Normal pulses.     Heart sounds: Normal heart sounds. No murmur heard. Pulmonary:     Effort: Pulmonary effort is normal.     Breath sounds: Normal breath sounds. No wheezing, rhonchi or rales.  Abdominal:     General: Abdomen is flat. Bowel sounds are normal. There is no distension.     Palpations: Abdomen is soft.     Tenderness: There is no abdominal tenderness.  Musculoskeletal:  General: No swelling or deformity. Normal range of motion.     Cervical back: Normal range of motion.  Skin:    General: Skin is warm and dry.     Capillary Refill: Capillary refill takes less than 2 seconds.  Neurological:     General: No focal deficit present.     Mental Status: He is alert and oriented to person, place, and time.     Motor: No weakness.  Psychiatric:        Mood and Affect: Mood normal.        Behavior: Behavior normal.        Thought Content: Thought content normal.   Last CBC Lab Results  Component Value Date   WBC 7.4 10/26/2022   HGB 14.0 10/26/2022   HCT 42.6 10/26/2022   MCV 90 10/26/2022   MCH 29.7 10/26/2022   RDW 12.7 10/26/2022   PLT 302 10/26/2022   Last metabolic panel Lab Results  Component Value Date   GLUCOSE 106 (H) 10/26/2022   NA 138 10/26/2022   K 4.5 10/26/2022   CL 100 10/26/2022   CO2 22 10/26/2022   BUN 12 10/26/2022   CREATININE 0.89 10/26/2022   GFRNONAA >60 09/25/2022   CALCIUM 9.7 10/26/2022   PROT 7.5 10/26/2022   ALBUMIN 4.5 10/26/2022   LABGLOB 3.0 10/26/2022   BILITOT 0.5 10/26/2022   ALKPHOS 104 10/26/2022   AST 25 10/26/2022   ALT 20 10/26/2022   ANIONGAP 10 09/25/2022   Last lipids Lab Results  Component Value Date   CHOL 145 10/26/2022   HDL  52 10/26/2022   LDLCALC 77 10/26/2022   TRIG 83 10/26/2022   CHOLHDL 2.8 10/26/2022   Last thyroid functions Lab Results  Component Value Date   TSH 1.430 10/26/2022   T4TOTAL 6.5 11/14/2015     Assessment & Plan:   Problem List Items Addressed This Visit       Essential hypertension    Adequately controlled on current antihypertensive regimen consisting of amlodipine, diltiazem, Imdur, and metoprolol.  No medication changes are indicated today.      Anxiety - Primary    Remains well-controlled with as needed alprazolam.  Refill provided today.      Chronic musculoskeletal pain    He endorses a history of chronic back and knee pain in the setting of prior back surgery (2007).  Most recently, he has been managing his pain with as needed use of prednisone 5 mg.  Refill requested today.      Return in about 6 months (around 04/28/2023).   Billie Lade, MD

## 2022-11-05 DIAGNOSIS — M7918 Myalgia, other site: Secondary | ICD-10-CM | POA: Insufficient documentation

## 2022-11-05 DIAGNOSIS — G8929 Other chronic pain: Secondary | ICD-10-CM | POA: Insufficient documentation

## 2022-11-05 NOTE — Assessment & Plan Note (Signed)
Adequately controlled on current antihypertensive regimen consisting of amlodipine, diltiazem, Imdur, and metoprolol.  No medication changes are indicated today.

## 2022-11-05 NOTE — Assessment & Plan Note (Signed)
He endorses a history of chronic back and knee pain in the setting of prior back surgery (2007).  Most recently, he has been managing his pain with as needed use of prednisone 5 mg.  Refill requested today.

## 2022-11-05 NOTE — Assessment & Plan Note (Signed)
Remains well-controlled with as needed alprazolam.  Refill provided today.

## 2022-11-12 ENCOUNTER — Ambulatory Visit (INDEPENDENT_AMBULATORY_CARE_PROVIDER_SITE_OTHER): Payer: Medicare Other | Admitting: Gastroenterology

## 2022-11-12 ENCOUNTER — Encounter (INDEPENDENT_AMBULATORY_CARE_PROVIDER_SITE_OTHER): Payer: Self-pay | Admitting: Gastroenterology

## 2022-11-12 VITALS — BP 160/77 | HR 72 | Temp 98.0°F | Ht 68.0 in | Wt 144.8 lb

## 2022-11-12 DIAGNOSIS — K219 Gastro-esophageal reflux disease without esophagitis: Secondary | ICD-10-CM

## 2022-11-12 DIAGNOSIS — K589 Irritable bowel syndrome without diarrhea: Secondary | ICD-10-CM

## 2022-11-12 NOTE — Patient Instructions (Addendum)
Can take pantoprazole 40 mg as needed if recurrent heartburn Continue avoiding carbonated drinks/sodas or food that has tomatoes, spicy or greasy food.

## 2022-11-12 NOTE — Progress Notes (Signed)
[name], M.D. Gastroenterology & Hepatology [facility] [address] [place], [place] [address]  Primary Care Physician: Miguel Lade, MD [address] [place] [address]  I will communicate my assessment and recommendations to the referring MD via EMR.  Problems: Epigastric abdominal pain, resolved IBS Candida esophagitis - resolved   History of Present Illness: [name] is a 77 y.o. male with past medical history of anxiety, atrial fibrillation, testicular cancer, GERD, hyperlipidemia, hypertension, IBS, who presents for follow up of abdominal pain and diarrhea.    The patient was last seen on 07/09/2022. At that time, the patient was scheduled to undergo a CT of the pelvis with IV contrast which was performed on 07/23/2022.  CT scan was within normal limits.  He was started on daily probiotics and was advised to start daily protein shakes.  Patient reports that he has felt much better and denies having any complaints.  He is having regular bowel movements.  The patient denies having any nausea, vomiting, fever, chills, hematochezia, melena, hematemesis, abdominal distention, abdominal pain, diarrhea, jaundice, pruritus.  Weight has been stable, has been drinking protein shakes on a regular basis.  Patient has avoided tomato based foods so he does not have to take any medicines. He has not taken a PPI in 2 months.  Most recent blood workup from 10/26/2022 showed a normal CMP and CBC.  Last EGD January 2024: -White nummular lesions in the esophageal mucosa, Candida esophagitis (treated with fluconazole) -Normal stomach status post biopsy, chronic gastritis, H. pylori negative -Normal duodenum   Last Colonoscopy:06/28/2021 There was presence of a small polyp in the splenic flexure, 4 polyps in the rectum, transverse colon and a 12 mm serrated polyp in the hepatic flexure.  All polyps were removed, the largest polyp was  removed with a hot snare, tattoo was placed next to this polypectomy..  Scar was noted in the mid sigmoid from previous polypectomy.   A. COLON, SPLENIC FLEXURE, HEPATIC FLEXURE, RECTUM, CECAL, TRANSVERSE,  POLYPECTOMY:  Tubular adenomas, a sessile serrated polyp without cytologic dysplasia,  hyperplastic polyp and a benign hamartomatous polyp with.  Negative for high-grade dysplasia.   B. COLON, HEPATIC FLEXURE, POLYPECTOMY:  Inflammatory pseudopolyp (polypoid granulation tissue with ulceration of  the overlying epithelium) with an associated hyperplastic polyp.  Negative for dysplasia and malignancy.    Recommended repeat colonoscopy in 5 years  Past Medical History: Past Medical History:  Diagnosis Date   Anxiety    Atrial fibrillation (HCC)    Cancer (HCC)    testicular    Chest pain    myoview 07/09/12-normal, nl ef; echo 04/12/05-ef>55%, mild aortic sclerosis   GERD (gastroesophageal reflux disease)    H. pylori infection    Headache    Hypercholesteremia    Hyperlipidemia    Hypertension    Hyponatremia 2013   Palpitations    event monior 04/11/05- SR with occ PAC   Personal history of colonic polyps 2 9 2007    Past Surgical History: Past Surgical History:  Procedure Laterality Date   BACK SURGERY     BIOPSY  04/17/2022   Procedure: BIOPSY;  Surgeon: Dolores Frame, MD;  [facility];  Service: Gastroenterology;;   CATARACT EXTRACTION W/PHACO Left 12/08/2012   Procedure: CATARACT EXTRACTION PHACO AND INTRAOCULAR LENS PLACEMENT (IOC);  Surgeon: Gemma Payor, MD;  [facility];  Service: Ophthalmology;  Laterality: Left;  CDE:  23.19   CATARACT  EXTRACTION W/PHACO Right 04/15/2014   Procedure: CATARACT EXTRACTION PHACO AND INTRAOCULAR LENS PLACEMENT RIGHT;  Surgeon: Gemma Payor, MD;  [facility]: [facility];  Service: Ophthalmology;  Laterality: Right;  CDE:10.32   COLONOSCOPY  12/06/2010   Procedure: COLONOSCOPY;  Surgeon: Malissa Hippo, MD;  [facility];  Service: Endoscopy;  Laterality: N/A;   COLONOSCOPY N/A 05/23/2016   Procedure: COLONOSCOPY;  Surgeon: Malissa Hippo, MD;  [facility];  Service: Endoscopy;  Laterality: N/A;  930   COLONOSCOPY WITH PROPOFOL N/A 06/28/2021   Procedure: COLONOSCOPY WITH PROPOFOL;  Surgeon: Malissa Hippo, MD;  [facility];  Service: Endoscopy;  Laterality: N/A;  1045   CORONARY STENT INTERVENTION N/A 02/24/2018   Procedure: CORONARY STENT INTERVENTION;  Surgeon: [name], MD;  [facility];  Service: Cardiovascular;  Laterality: N/A;   ESOPHAGOGASTRODUODENOSCOPY N/A 07/20/2015   Procedure: ESOPHAGOGASTRODUODENOSCOPY (EGD);  Surgeon: Malissa Hippo, MD;  [facility];  Service: Endoscopy;  Laterality: N/A;  2:55 - moved to 1:55 - Ann notified pt   ESOPHAGOGASTRODUODENOSCOPY (EGD) WITH PROPOFOL N/A 04/17/2022   Procedure: ESOPHAGOGASTRODUODENOSCOPY (EGD) WITH PROPOFOL;  Surgeon: Dolores Frame, MD;  [facility];  Service: Gastroenterology;  Laterality: N/A;  215pm, asa 3   EYE SURGERY     LEFT HEART CATH AND CORONARY ANGIOGRAPHY N/A 02/24/2018   Procedure: LEFT HEART CATH AND CORONARY ANGIOGRAPHY;  Surgeon: [name], MD;  [facility];  Service: Cardiovascular;  Laterality: N/A;   LUMBAR DISC SURGERY Left 2007   "L4-5 ruptured discs"   POLYPECTOMY  05/23/2016   Procedure: POLYPECTOMY;  Surgeon: Malissa Hippo, MD;  [facility];  Service: Endoscopy;;  colon   POLYPECTOMY  06/28/2021   Procedure: POLYPECTOMY;  Surgeon: Malissa Hippo, MD;  [facility];  Service: Endoscopy;;  splenic flexure;hepatic flexurex2; cecal polyp;transverse colon polyp;rectal polyp;    Family History: Family History  Problem Relation Age of Onset   Hypertension Father    Hyperlipidemia Father    CAD Father        H/O CABG    Social History: Social History   Tobacco Use  Smoking Status  Never   Passive exposure: Past  Smokeless Tobacco Never   Social History   Substance and Sexual Activity  Alcohol Use No   Social History   Substance and Sexual Activity  Drug Use No    Allergies: Allergies  Allergen Reactions   Lisinopril Swelling    H/o of angioedema    Chlorthalidone Other (See Comments)    Causes severe hyponatremia   Flexeril [Cyclobenzaprine]     Didn't feel right   Hyoscyamine Swelling   Prevacid [Lansoprazole] Swelling   Valium [Diazepam] Other (See Comments)    "makes me feel crazy"   Potassium-Containing Compounds Palpitations    Medications: Current Outpatient Medications  Medication Sig Dispense Refill   acetaminophen (TYLENOL) 500 MG tablet Take 250 mg by mouth 2 (two) times daily as needed for moderate pain or headache.     ALPRAZolam (XANAX) 0.5 MG tablet Take 1 tablet (0.5 mg total) by mouth 3 (three) times daily as needed for anxiety. 30 tablet 0   amLODipine (NORVASC) 5 MG tablet Take 1 tablet (5 mg total) by mouth daily. 90 tablet 3   apixaban (ELIQUIS) 5 MG TABS tablet Take 1 tablet (5 mg total) by mouth 2 (two) times daily. 180 tablet 3  atorvastatin (LIPITOR) 80 MG tablet TAKE 1 TABLET BY MOUTH AT BEDTIME. 90 tablet 3   clopidogrel (PLAVIX) 75 MG tablet Take 1 tablet (75 mg total) by mouth daily. 90 tablet 3   diltiazem (CARDIZEM CD) 120 MG 24 hr capsule Take 1 capsule (120 mg total) by mouth daily. 90 capsule 3   isosorbide mononitrate (IMDUR) 30 MG 24 hr tablet Take 1 tablet (30 mg total) by mouth daily. 90 tablet 3   metoprolol tartrate (LOPRESSOR) 25 MG tablet Take 1 tablet (25 mg total) by mouth 2 (two) times daily. 180 tablet 3   pantoprazole (PROTONIX) 40 MG tablet Take 1 tablet (40 mg total) by mouth daily. 90 tablet 0   predniSONE (DELTASONE) 5 MG tablet Take 1 tablet (5 mg total) by mouth daily as needed (as needed for knee pain). As needed for knee pain 10 tablet 0   rosuvastatin (CRESTOR) 40 MG tablet Take 1 tablet (40  mg total) by mouth daily. 90 tablet 3   No current facility-administered medications for this visit.    Review of Systems: GENERAL: negative for malaise, night sweats HEENT: No changes in hearing or vision, no nose bleeds or other nasal problems. NECK: Negative for lumps, goiter, pain and significant neck swelling RESPIRATORY: Negative for cough, wheezing CARDIOVASCULAR: Negative for chest pain, leg swelling, palpitations, orthopnea GI: SEE HPI MUSCULOSKELETAL: Negative for joint pain or swelling, back pain, and muscle pain. SKIN: Negative for lesions, rash PSYCH: Negative for sleep disturbance, mood disorder and recent psychosocial stressors. HEMATOLOGY Negative for prolonged bleeding, bruising easily, and swollen nodes. ENDOCRINE: Negative for cold or heat intolerance, polyuria, polydipsia and goiter. NEURO: negative for tremor, gait imbalance, syncope and seizures. The remainder of the review of systems is noncontributory.   Physical Exam: BP (!) 160/77 (BP Location: Left Arm, Patient Position: Sitting, Cuff Size: Normal)   Pulse 72   Temp 98 F (36.7 C) (Temporal)   Ht 5\' 8"  (1.727 m)   Wt 144 lb 12.8 oz (65.7 kg)   BMI 22.02 kg/m  GENERAL: The patient is AO x3, in no acute distress. HEENT: Head is normocephalic and atraumatic. EOMI are intact. Mouth is well hydrated and without lesions. NECK: Supple. No masses LUNGS: Clear to auscultation. No presence of rhonchi/wheezing/rales. Adequate chest expansion HEART: RRR, normal s1 and s2. ABDOMEN: Soft, nontender, no guarding, no peritoneal signs, and nondistended. BS +. No masses. EXTREMITIES: Without any cyanosis, clubbing, rash, lesions or edema. NEUROLOGIC: AOx3, no focal motor deficit. SKIN: no jaundice, no rashes  Imaging/Labs: as above  I personally reviewed and interpreted the available labs, imaging and endoscopic files.  Impression and Plan: [name] is a 77 y.o. male with past medical history of anxiety,  atrial fibrillation, testicular cancer, GERD, hyperlipidemia, hypertension, IBS, who presents for follow up of abdominal pain and diarrhea.  The patient reports his symptoms have significantly improved and he denies any complaints.  He has actually control his GERD episodes with dietary modifications.  He will continue preventing this but can take PPI as needed if he presents recurrent episodes of heartburn.  - Can take pantoprazole 40 mg as needed if recurrent heartburn - Continue avoiding carbonated drinks/sodas or food that has tomatoes, spicy or greasy food.  All questions were answered.      Miguel Blazing, MD Gastroenterology and Hepatology [facility]

## 2022-11-15 ENCOUNTER — Encounter: Payer: Self-pay | Admitting: Internal Medicine

## 2022-11-15 ENCOUNTER — Ambulatory Visit: Payer: Medicare Other | Admitting: Internal Medicine

## 2022-11-15 ENCOUNTER — Telehealth: Payer: Self-pay | Admitting: Internal Medicine

## 2022-11-15 VITALS — BP 147/69 | HR 81 | Temp 98.1°F | Ht 68.0 in | Wt 144.6 lb

## 2022-11-15 DIAGNOSIS — S39011A Strain of muscle, fascia and tendon of abdomen, initial encounter: Secondary | ICD-10-CM | POA: Diagnosis not present

## 2022-11-15 NOTE — Telephone Encounter (Signed)
Patient called at 9:06 am  having severe left pain side saying around rib area he said he was told by Dr Durwin Nora if he has any problems last week to call and put on schedule. He only wants to see DR Durwin Nora. Sent message in teams asking nurse to speak to patient if he could actually work him into schedule or advise

## 2022-11-15 NOTE — Patient Instructions (Signed)
It was a pleasure to see you today.  Thank you for giving [name] the opportunity to be involved in your care.  Below is a brief recap of your visit and next steps.  We will plan to see you again in February.  Summary I believe that your pain is related to a muscle strain along your abdominal wall I recommend taking Tylenol as needed for pain relief If pain does not improve or gets worse over the weekend, I recommend going to the emergency department

## 2022-11-15 NOTE — Progress Notes (Signed)
   Acute Office Visit  Subjective:     Patient ID: [name], male    DOB: 07/29/1945, 77 y.o.   MRN: [id number]  Chief Complaint  Patient presents with   Flank Pain    Severe left side pain, states that it started on Tuesday. States that the intensity goes up and down and that it is a nagging pain. Rates pain at 6 to 7.   [name] presents today for an acute visit endorsing a 2-day history of left upper quadrant abdominal pain.  Pain began on Tuesday (8/27).  There was no inciting event or trauma at the onset of pain.  Pain is worse with twisting motions at the waist and also with sitting down.  He largely denies associated symptoms such as nausea/vomiting, diarrhea/constipation, hematochezia, melena, dysuria, and hematuria.  Pain does not radiate.  He has not tried any medication for pain relief.  He has not experienced this pain previously.  Review of Systems  Gastrointestinal:  Positive for abdominal pain (LUQ abdominal pain).  All other systems reviewed and are negative.     Objective:    BP (!) 147/69 (BP Location: Right Arm, Patient Position: Sitting, Cuff Size: Normal)   Pulse 81   Temp 98.1 F (36.7 C) (Oral)   Ht 5\' 8"  (1.727 m)   Wt 144 lb 9.6 oz (65.6 kg)   SpO2 97%   BMI 21.99 kg/m   Physical Exam Vitals reviewed.  Constitutional:      General: He is not in acute distress.    Appearance: Normal appearance.  Cardiovascular:     Rate and Rhythm: Normal rate and regular rhythm.  Pulmonary:     Effort: Pulmonary effort is normal.     Breath sounds: Normal breath sounds.  Abdominal:     General: Abdomen is flat. Bowel sounds are normal. There is no distension.     Palpations: Abdomen is soft. There is no mass.     Tenderness: There is abdominal tenderness (There is mild point tenderness to superficial palpation in the left upper quadrant.  There is no rebound tenderness or guarding). There is no right CVA tenderness, left CVA tenderness, guarding or rebound.   Neurological:     Mental Status: He is alert.       Assessment & Plan:   Problem List Items Addressed This Visit       Strain of abdominal muscle - Primary    Presenting today for an acute visit endorsing left upper quadrant abdominal pain.  On exam there is superficial point tenderness to palpation in the left upper quadrant.  No significant pain is elicited with deep palpation.  There is no rebound tenderness or guarding present.  No splenomegaly is appreciated.  There is no CVA tenderness.  Pain is worse with twisting motions at the waist and also with sitting down.  Overall, his symptoms and current exam findings seem most consistent with a strain of an abdominal wall muscle.  I am less concerned for acute intra-abdominal pathology. -Recommend supportive care measures for now for management of abdominal wall muscle strain.  He was instructed return to care if his symptoms worsen or fail to improve.  Would consider CT abdomen pelvis at that time.  Otherwise, he is scheduled for routine follow-up with me in February 2025      Return if symptoms worsen or fail to improve.  Billie Lade, MD

## 2022-11-16 ENCOUNTER — Ambulatory Visit: Payer: Medicare Other | Admitting: Internal Medicine

## 2022-11-20 ENCOUNTER — Encounter: Payer: Self-pay | Admitting: Internal Medicine

## 2022-11-20 ENCOUNTER — Other Ambulatory Visit: Payer: Self-pay

## 2022-11-20 ENCOUNTER — Telehealth: Payer: Self-pay | Admitting: Internal Medicine

## 2022-11-20 DIAGNOSIS — S39011A Strain of muscle, fascia and tendon of abdomen, initial encounter: Secondary | ICD-10-CM | POA: Insufficient documentation

## 2022-11-20 DIAGNOSIS — R1012 Left upper quadrant pain: Secondary | ICD-10-CM

## 2022-11-20 NOTE — Telephone Encounter (Signed)
Patient  came in office was seen for his visit last week was told to go to [facility] for a CT scan on Sunday by Dr Durwin Nora, but they were closed. Teams message sent asking nurse to speak to patient, he waited in lobby.

## 2022-11-20 NOTE — Assessment & Plan Note (Signed)
Presenting today for an acute visit endorsing left upper quadrant abdominal pain.  On exam there is superficial point tenderness to palpation in the left upper quadrant.  No significant pain is elicited with deep palpation.  There is no rebound tenderness or guarding present.  No splenomegaly is appreciated.  There is no CVA tenderness.  Pain is worse with twisting motions at the waist and also with sitting down.  Overall, his symptoms and current exam findings seem most consistent with a strain of an abdominal wall muscle.  I am less concerned for acute intra-abdominal pathology. -Recommend supportive care measures for now for management of abdominal wall muscle strain.  He was instructed return to care if his symptoms worsen or fail to improve.  Would consider CT abdomen pelvis at that time.  Otherwise, he is scheduled for routine follow-up with me in February 2025

## 2022-11-20 NOTE — Telephone Encounter (Signed)
Patient aware.

## 2022-12-01 ENCOUNTER — Other Ambulatory Visit (HOSPITAL_BASED_OUTPATIENT_CLINIC_OR_DEPARTMENT_OTHER): Payer: Medicare Other

## 2022-12-06 ENCOUNTER — Ambulatory Visit
Admission: RE | Admit: 2022-12-06 | Discharge: 2022-12-06 | Disposition: A | Discharge status: Home / Self Care | Payer: Medicare Other | Source: Ambulatory Visit | Attending: Internal Medicine | Admitting: Internal Medicine

## 2022-12-06 DIAGNOSIS — I2510 Atherosclerotic heart disease of native coronary artery without angina pectoris: Secondary | ICD-10-CM | POA: Diagnosis not present

## 2022-12-06 DIAGNOSIS — R1012 Left upper quadrant pain: Secondary | ICD-10-CM

## 2022-12-06 DIAGNOSIS — Z8547 Personal history of malignant neoplasm of testis: Secondary | ICD-10-CM | POA: Diagnosis not present

## 2022-12-06 DIAGNOSIS — I700 Atherosclerosis of aorta: Secondary | ICD-10-CM | POA: Diagnosis not present

## 2022-12-17 ENCOUNTER — Telehealth: Payer: Self-pay | Admitting: Internal Medicine

## 2022-12-17 NOTE — Telephone Encounter (Signed)
Spouse [name] called in on patient behalf, wants to have results from patients CT scan

## 2022-12-17 NOTE — Telephone Encounter (Signed)
Spoke to patient , we have not gotten the results but informed that we will call once we do

## 2023-01-08 ENCOUNTER — Encounter: Payer: Self-pay | Admitting: Internal Medicine

## 2023-01-08 ENCOUNTER — Ambulatory Visit: Payer: Medicare Other | Admitting: Internal Medicine

## 2023-01-08 VITALS — BP 147/71 | HR 62 | Ht 68.0 in | Wt 147.2 lb

## 2023-01-08 DIAGNOSIS — S8011XA Contusion of right lower leg, initial encounter: Secondary | ICD-10-CM

## 2023-01-08 NOTE — Progress Notes (Signed)
   Acute Office Visit  Subjective:     Patient ID: [name], male    DOB: December 29, 1945, 77 y.o.   MRN: [id number]  Chief Complaint  Patient presents with   Knee Pain    Right knee pain for four days   [name] presents today for an acute visit endorsing a 4-day history of medial right knee pain and bruising.  There was no trauma or inciting event 4 days ago when he first noticed the bruising.  He describes dull aching pain.  Mobility is not impaired.  Pain is relieved with as needed use of Tylenol.  He states that he would largely like reassurance that this is not a blood clot.  He is currently prescribed Eliquis 5 mg twice daily in the setting of paroxysmal atrial fibrillation.  Review of Systems  Musculoskeletal:  Positive for joint pain (Right medial knee pain).  All other systems reviewed and are negative.     Objective:    BP (!) 147/71 (BP Location: Right Arm, Patient Position: Sitting, Cuff Size: Normal)   Pulse 62   Ht 5\' 8"  (1.727 m)   Wt 147 lb 3.2 oz (66.8 kg)   SpO2 96%   BMI 22.38 kg/m   Physical Exam Musculoskeletal:        General: Swelling (Bruising with underlying hematoma appreciated on the medial aspect of the right knee.  There is no surrounding erythema or joint effusion.) present. Normal range of motion.       Assessment & Plan:   Problem List Items Addressed This Visit       Hematoma of leg, right, initial encounter    Presenting today for an acute visit endorsing a 4-day history of medial right knee pain as noted above.  On exam there is a hematoma present over the medial aspect of the right knee.  He denies inciting event or trauma 4 days ago.  There is no significant warmth, erythema, or effusion, or general swelling present.  Low concern for DVT given physical exam and that he is anticoagulated with Eliquis. -Treatment options were reviewed with [name].  I recommended as needed use of Tylenol for pain relief, application of a warm compress  when able, and general range of motion exercises.  He was instructed return to care if his symptoms worsen or fail to improve within 1-2 weeks.      Return if symptoms worsen or fail to improve.  Miguel Lade, MD

## 2023-01-08 NOTE — Patient Instructions (Signed)
It was a pleasure to see you today.  Thank you for giving [name] the opportunity to be involved in your care.  Below is a brief recap of your visit and next steps.  We will plan to see you again in February.  Summary You have a hematoma in your right leg. Continue as needed use of tylenol for pain relief Apply a warm compress when able This should resolve within 1-2 weeks. Follow up as scheduled in February.

## 2023-01-08 NOTE — Assessment & Plan Note (Signed)
Presenting today for an acute visit endorsing a 4-day history of medial right knee pain as noted above.  On exam there is a hematoma present over the medial aspect of the right knee.  He denies inciting event or trauma 4 days ago.  There is no significant warmth, erythema, or effusion, or general swelling present.  Low concern for DVT given physical exam and that he is anticoagulated with Eliquis. -Treatment options were reviewed with [name].  I recommended as needed use of Tylenol for pain relief, application of a warm compress when able, and general range of motion exercises.  He was instructed return to care if his symptoms worsen or fail to improve within 1-2 weeks.

## 2023-01-22 ENCOUNTER — Encounter: Payer: Self-pay | Admitting: Internal Medicine

## 2023-01-22 ENCOUNTER — Ambulatory Visit (INDEPENDENT_AMBULATORY_CARE_PROVIDER_SITE_OTHER): Payer: Medicare Other | Admitting: Internal Medicine

## 2023-01-22 VITALS — BP 146/70 | HR 62 | Ht 68.0 in | Wt 147.2 lb

## 2023-01-22 DIAGNOSIS — I10 Essential (primary) hypertension: Secondary | ICD-10-CM

## 2023-01-22 DIAGNOSIS — I480 Paroxysmal atrial fibrillation: Secondary | ICD-10-CM

## 2023-01-22 DIAGNOSIS — S0990XA Unspecified injury of head, initial encounter: Secondary | ICD-10-CM | POA: Diagnosis not present

## 2023-01-22 NOTE — Assessment & Plan Note (Signed)
Impact injury with glass wall In the setting of normal neurologic exam and absence of concerning symptoms, would avoid imaging -reassurance provided Tylenol as needed for headache - has helped with headaches so far And advised to get medical attention if any new symptoms such as worsening of headache, double vision, focal neurologic deficits such as numbness or weakness of UE or LE

## 2023-01-22 NOTE — Assessment & Plan Note (Signed)
BP Readings from Last 1 Encounters:  01/22/23 (!) 146/70   Uncontrolled with amlodipine 5 mg QD, diltiazem 120 mg QD, metoprolol 25 mg twice daily and Imdur 30 mg QD Increased dose of amlodipine to 10 mg once daily -advised to take 2 tablets of amlodipine 5 mg for now Counseled for compliance with the medications Advised DASH diet and moderate exercise/walking, at least 150 mins/week

## 2023-01-22 NOTE — Assessment & Plan Note (Signed)
Currently in sinus rhythm Rate controlled with diltiazem and metoprolol On Eliquis for [facility]

## 2023-01-22 NOTE — Progress Notes (Signed)
Acute Office Visit  Subjective:    Patient ID: [name], male    DOB: 07-29-1945, 77 y.o.   MRN: [id number]  Chief Complaint  Patient presents with   Head Injury    Patient ran into the wall a week ago, hit head    HPI Patient is in today for complaint of soreness in forehead area since the last week.  He was at a dealership office and hit glasswall on 01/15/23 while walking out of the office.  He did not lose consciousness.  Denies any prodromal symptoms such as chest pain, dyspnea or palpitations.  Denies any seizure-like activity.  Except for mild headache, he denies any visual disturbance, double vision, focal numbness or weakness of UE or LE.  He is currently on Eliquis for PAF and Plavix for CAD s/p stent placement.  His blood pressure has been at home, in 140s/70s even before the injury.  He has mild headache, but denies any chest pain, dyspnea or palpitations.   Past Medical History:  Diagnosis Date   Anxiety    Atrial fibrillation (HCC)    Cancer (HCC)    testicular    Chest pain    myoview 07/09/12-normal, nl ef; echo 04/12/05-ef>55%, mild aortic sclerosis   GERD (gastroesophageal reflux disease)    H. pylori infection    Headache    Hypercholesteremia    Hyperlipidemia    Hypertension    Hyponatremia 2013   Palpitations    event monior 04/11/05- SR with occ PAC   Personal history of colonic polyps 2 9 2007    Past Surgical History:  Procedure Laterality Date   BACK SURGERY     BIOPSY  04/17/2022   Procedure: BIOPSY;  Surgeon: Dolores Frame, MD;  [facility]: [facility];  Service: Gastroenterology;;   CATARACT EXTRACTION W/PHACO Left 12/08/2012   Procedure: CATARACT EXTRACTION PHACO AND INTRAOCULAR LENS PLACEMENT (IOC);  Surgeon: Gemma Payor, MD;  Location: [facility];  Service: Ophthalmology;  Laterality: Left;  CDE:  23.19   CATARACT EXTRACTION W/PHACO Right 04/15/2014   Procedure: CATARACT EXTRACTION PHACO AND INTRAOCULAR LENS PLACEMENT RIGHT;   Surgeon: Gemma Payor, MD;  Location: [facility];  Service: Ophthalmology;  Laterality: Right;  CDE:10.32   COLONOSCOPY  12/06/2010   Procedure: COLONOSCOPY;  Surgeon: Malissa Hippo, MD;  [facility]: [facility];  Service: Endoscopy;  Laterality: N/A;   COLONOSCOPY N/A 05/23/2016   Procedure: COLONOSCOPY;  Surgeon: Malissa Hippo, MD;  [facility];  Service: Endoscopy;  Laterality: N/A;  930   COLONOSCOPY WITH PROPOFOL N/A 06/28/2021   Procedure: COLONOSCOPY WITH PROPOFOL;  Surgeon: Malissa Hippo, MD;  [facility]: [facility];  Service: Endoscopy;  Laterality: N/A;  1045   CORONARY STENT INTERVENTION N/A 02/24/2018   Procedure: CORONARY STENT INTERVENTION;  Surgeon: [name], MD;  [facility]: [facility];  Service: Cardiovascular;  Laterality: N/A;   ESOPHAGOGASTRODUODENOSCOPY N/A 07/20/2015   Procedure: ESOPHAGOGASTRODUODENOSCOPY (EGD);  Surgeon: Malissa Hippo, MD;  [facility]: [facility];  Service: Endoscopy;  Laterality: N/A;  2:55 - moved to 1:55 - Ann notified pt   ESOPHAGOGASTRODUODENOSCOPY (EGD) WITH PROPOFOL N/A 04/17/2022   Procedure: ESOPHAGOGASTRODUODENOSCOPY (EGD) WITH PROPOFOL;  Surgeon: Dolores Frame, MD;  [facility];  Service: Gastroenterology;  Laterality: N/A;  215pm, asa 3   EYE SURGERY     LEFT HEART CATH AND CORONARY ANGIOGRAPHY N/A 02/24/2018   Procedure: LEFT HEART CATH AND CORONARY ANGIOGRAPHY;  Surgeon:  [name], MD;  [facility]: [facility];  Service: Cardiovascular;  Laterality: N/A;   LUMBAR DISC SURGERY Left 2007   "L4-5 ruptured discs"   POLYPECTOMY  05/23/2016   Procedure: POLYPECTOMY;  Surgeon: Malissa Hippo, MD;  [facility];  Service: Endoscopy;;  colon   POLYPECTOMY  06/28/2021   Procedure: POLYPECTOMY;  Surgeon: Malissa Hippo, MD;  [facility];  Service: Endoscopy;;  splenic flexure;hepatic flexurex2; cecal polyp;transverse colon polyp;rectal polyp;    Family History   Problem Relation Age of Onset   Hypertension Father    Hyperlipidemia Father    CAD Father        H/O CABG    Social History   Socioeconomic History   Marital status: Married    Spouse name: Not on file   Number of children: 1   Years of education: Not on file   Highest education level: Not on file  Occupational History   Not on file  Tobacco Use   Smoking status: Never    Passive exposure: Past   Smokeless tobacco: Never  Vaping Use   Vaping status: Never Used  Substance and Sexual Activity   Alcohol use: No   Drug use: No   Sexual activity: Not Currently  Other Topics Concern   Not on file  Social History Narrative   ** Merged History Encounter **       Social Determinants of Health   Financial Resource Strain: Low Risk  (02/25/2018)   Overall Financial Resource Strain (CARDIA)    Difficulty of Paying Living Expenses: Not hard at all  Food Insecurity: No Food Insecurity (03/29/2022)   Hunger Vital Sign    Worried About Running Out of Food in the Last Year: Never true    Ran Out of Food in the Last Year: Never true  Transportation Needs: No Transportation Needs (03/29/2022)   PRAPARE - Administrator, Civil Service (Medical): No    Lack of Transportation (Non-Medical): No  Physical Activity: Insufficiently Active (02/25/2018)   Exercise Vital Sign    Days of Exercise per Week: 3 days    Minutes of Exercise per Session: 10 min  Stress: Stress Concern Present (02/25/2018)   [employer] of Occupational Health - Occupational Stress Questionnaire    Feeling of Stress : Rather much  Social Connections: Not on file  Intimate Partner Violence: Not At Risk (03/29/2022)   Humiliation, Afraid, Rape, and Kick questionnaire    Fear of Current or Ex-Partner: No    Emotionally Abused: No    Physically Abused: No    Sexually Abused: No    Outpatient Medications Prior to Visit  Medication Sig Dispense Refill   acetaminophen (TYLENOL) 500 MG tablet Take  250 mg by mouth 2 (two) times daily as needed for moderate pain or headache.     ALPRAZolam (XANAX) 0.5 MG tablet Take 1 tablet (0.5 mg total) by mouth 3 (three) times daily as needed for anxiety. 30 tablet 0   amLODipine (NORVASC) 5 MG tablet Take 1 tablet (5 mg total) by mouth daily. 90 tablet 3   apixaban (ELIQUIS) 5 MG TABS tablet Take 1 tablet (5 mg total) by mouth 2 (two) times daily. 180 tablet 3   atorvastatin (LIPITOR) 80 MG tablet TAKE 1 TABLET BY MOUTH AT BEDTIME. 90 tablet 3   clopidogrel (PLAVIX) 75 MG tablet Take 1 tablet (75 mg total) by mouth daily. 90 tablet 3   diltiazem (CARDIZEM  CD) 120 MG 24 hr capsule Take 1 capsule (120 mg total) by mouth daily. 90 capsule 3   isosorbide mononitrate (IMDUR) 30 MG 24 hr tablet Take 1 tablet (30 mg total) by mouth daily. 90 tablet 3   metoprolol tartrate (LOPRESSOR) 25 MG tablet Take 1 tablet (25 mg total) by mouth 2 (two) times daily. 180 tablet 3   pantoprazole (PROTONIX) 40 MG tablet Take 1 tablet (40 mg total) by mouth daily. 90 tablet 0   predniSONE (DELTASONE) 5 MG tablet Take 1 tablet (5 mg total) by mouth daily as needed (as needed for knee pain). As needed for knee pain 10 tablet 0   rosuvastatin (CRESTOR) 40 MG tablet Take 1 tablet (40 mg total) by mouth daily. 90 tablet 3   No facility-administered medications prior to visit.    Allergies  Allergen Reactions   Lisinopril Swelling    H/o of angioedema    Chlorthalidone Other (See Comments)    Causes severe hyponatremia   Flexeril [Cyclobenzaprine]     Didn't feel right   Hyoscyamine Swelling   Prevacid [Lansoprazole] Swelling   Valium [Diazepam] Other (See Comments)    "makes me feel crazy"   Potassium-Containing Compounds Palpitations    Review of Systems  Constitutional:  Negative for chills and fever.  HENT:  Negative for congestion and sore throat.   Eyes:  Negative for pain and discharge.  Respiratory:  Negative for cough and shortness of breath.    Cardiovascular:  Negative for chest pain and palpitations.  Gastrointestinal:  Negative for diarrhea, nausea and vomiting.  Endocrine: Negative for polydipsia and polyuria.  Genitourinary:  Negative for dysuria and hematuria.  Musculoskeletal:  Negative for neck pain and neck stiffness.  Skin:  Negative for rash.  Neurological:  Negative for dizziness, weakness and numbness.       Soreness of forehead  Psychiatric/Behavioral:  Negative for agitation and behavioral problems.        Objective:    Physical Exam Vitals reviewed.  Constitutional:      General: He is not in acute distress.    Appearance: He is not diaphoretic.  HENT:     Head: Normocephalic.     Comments: Small bump over forehead, about 2 cm X 1 cm area, no periorbital hematoma changes    Nose: Nose normal.  Eyes:     General: No scleral icterus.    Extraocular Movements: Extraocular movements intact.  Cardiovascular:     Rate and Rhythm: Normal rate and regular rhythm.     Heart sounds: Normal heart sounds. No murmur heard. Pulmonary:     Breath sounds: Normal breath sounds. No wheezing or rales.  Musculoskeletal:     Cervical back: Neck supple. No tenderness.     Right lower leg: No edema.     Left lower leg: No edema.  Skin:    General: Skin is warm.     Findings: No rash.  Neurological:     General: No focal deficit present.     Mental Status: He is alert and oriented to person, place, and time.     Cranial Nerves: No cranial nerve deficit.     Sensory: No sensory deficit.     Motor: No weakness.     Coordination: Coordination normal.     Gait: Gait normal.  Psychiatric:        Mood and Affect: Mood normal.        Behavior: Behavior normal.     BP Marland Kitchen)  146/70 (BP Location: Left Arm)   Pulse 62   Ht 5\' 8"  (1.727 m)   Wt 147 lb 3.2 oz (66.8 kg)   SpO2 95%   BMI 22.38 kg/m  Wt Readings from Last 3 Encounters:  01/22/23 147 lb 3.2 oz (66.8 kg)  01/08/23 147 lb 3.2 oz (66.8 kg)  11/15/22 144 lb  9.6 oz (65.6 kg)        Assessment & Plan:   Head injury Impact injury with glass wall In the setting of normal neurologic exam and absence of concerning symptoms, would avoid imaging -reassurance provided Tylenol as needed for headache - has helped with headaches so far And advised to get medical attention if any new symptoms such as worsening of headache, double vision, focal neurologic deficits such as numbness or weakness of UE or LE  Essential hypertension BP Readings from Last 1 Encounters:  01/22/23 (!) 146/70   Uncontrolled with amlodipine 5 mg QD, diltiazem 120 mg QD, metoprolol 25 mg twice daily and Imdur 30 mg QD Increased dose of amlodipine to 10 mg once daily -advised to take 2 tablets of amlodipine 5 mg for now Counseled for compliance with the medications Advised DASH diet and moderate exercise/walking, at least 150 mins/week   PAF (paroxysmal atrial fibrillation) (HCC) Currently in sinus rhythm Rate controlled with diltiazem and metoprolol On Eliquis for AC   No orders of the defined types were placed in this encounter.    Anabel Halon, MD

## 2023-01-22 NOTE — Patient Instructions (Signed)
Please start taking Amlodipine 10 mg once daily - okay to take 2 tablets of 5 mg for now.  Please contact us if you start having any new symptoms such as dizziness, blurry vision/double vision or worsening of headache, numbness or tingling of upper or lower extremities.

## 2023-01-24 ENCOUNTER — Other Ambulatory Visit: Payer: Self-pay | Admitting: Internal Medicine

## 2023-01-24 ENCOUNTER — Ambulatory Visit: Payer: Self-pay | Admitting: Internal Medicine

## 2023-01-24 ENCOUNTER — Telehealth: Payer: Self-pay | Admitting: Internal Medicine

## 2023-01-24 DIAGNOSIS — S0990XA Unspecified injury of head, initial encounter: Secondary | ICD-10-CM

## 2023-01-24 NOTE — Telephone Encounter (Signed)
Left message for patient

## 2023-01-24 NOTE — Telephone Encounter (Signed)
Patient called in to call center , still complaining of head issues from running into wall. Wants to have CT orders placed .  Last appt with provider 01/22/23

## 2023-01-24 NOTE — Telephone Encounter (Signed)
Per pt requesting CT after head injury seen recently for injury in clinic, on blood thinners Chief Complaint: increased severity in headache since head injury, seen after injury was told he might need CT Frequency: intermittent Pertinent Negatives: Patient denies N/V, denies dizziness, denies vision change Disposition: [] ED /[] Urgent Care (no appt availability in office) / [] Appointment(In office/virtual)/ []  [facility]/ [] Home Care/ [] Refused Recommended Disposition /[] [facility]/ [x]  Follow-up with PCP Additional Notes: Spoke with clinic will fwd request to PCP. Pt advised to go to ED should he have vision changes, N/V, loss of coordination or worsening s/s.   Reason for Disposition  [1] MODERATE headache (e.g., interferes with normal activities) AND [2] present > 24 hours AND [3] unexplained  (Exceptions: analgesics not tried, typical migraine, or headache part of viral illness)  Answer Assessment - Initial Assessment Questions 1. LOCATION: "Where does it hurt?"      entire 2. ONSET: "When did the headache start?" (Minutes, hours or days)      Since hit head, 9 days ago 3. PATTERN: "Does the pain come and go, or has it been constant since it started?"     Headaches intermittent since injury, increasing in severity 4. SEVERITY: "How bad is the pain?" and "What does it keep you from doing?"  (e.g., Scale 1-10; mild, moderate, or severe)   - MILD (1-3): doesn't interfere with normal activities    - MODERATE (4-7): interferes with normal activities or awakens from sleep    - SEVERE (8-10): excruciating pain, unable to do any normal activities        7 hard to sleep 5. HEAD INJURY: "Has there been any recent injury to the head?"      yes  Protocols used: Headache-A-AH

## 2023-01-24 NOTE — Telephone Encounter (Signed)
Called patient back at number listed in chart: no answer: left genertic message to call back .

## 2023-01-29 ENCOUNTER — Ambulatory Visit (HOSPITAL_COMMUNITY)
Admission: RE | Admit: 2023-01-29 | Discharge: 2023-01-29 | Disposition: A | Discharge status: Home / Self Care | Payer: Medicare Other | Source: Ambulatory Visit | Attending: Internal Medicine | Admitting: Internal Medicine

## 2023-01-29 DIAGNOSIS — S0990XA Unspecified injury of head, initial encounter: Secondary | ICD-10-CM | POA: Diagnosis not present

## 2023-01-29 DIAGNOSIS — R519 Headache, unspecified: Secondary | ICD-10-CM | POA: Diagnosis not present

## 2023-02-04 ENCOUNTER — Telehealth: Payer: Self-pay

## 2023-02-04 NOTE — Telephone Encounter (Signed)
Wife advised. 

## 2023-02-04 NOTE — Telephone Encounter (Signed)
Copied from CRM [id number]. Topic: Clinical - Medication Question >> Feb 04, 2023  8:01 AM [name] wrote: Reason for CRM: Patient is calling for a change in his blood pressure medication from amLODipine (NORVASC) 5 MG tablet due to getting headaches. He would like to revert back to his previous medication.

## 2023-02-06 ENCOUNTER — Ambulatory Visit: Payer: Self-pay | Admitting: Internal Medicine

## 2023-02-12 ENCOUNTER — Ambulatory Visit (INDEPENDENT_AMBULATORY_CARE_PROVIDER_SITE_OTHER): Payer: Medicare Other | Admitting: Internal Medicine

## 2023-02-12 ENCOUNTER — Encounter: Payer: Self-pay | Admitting: Internal Medicine

## 2023-02-12 ENCOUNTER — Ambulatory Visit: Payer: Medicare Other | Admitting: Internal Medicine

## 2023-02-12 VITALS — BP 156/74 | HR 72 | Ht 68.0 in | Wt 148.2 lb

## 2023-02-12 DIAGNOSIS — I10 Essential (primary) hypertension: Secondary | ICD-10-CM | POA: Diagnosis not present

## 2023-02-12 DIAGNOSIS — I480 Paroxysmal atrial fibrillation: Secondary | ICD-10-CM

## 2023-02-12 DIAGNOSIS — F4321 Adjustment disorder with depressed mood: Secondary | ICD-10-CM | POA: Diagnosis not present

## 2023-02-12 DIAGNOSIS — S0990XD Unspecified injury of head, subsequent encounter: Secondary | ICD-10-CM | POA: Diagnosis not present

## 2023-02-12 MED ORDER — OLMESARTAN MEDOXOMIL 5 MG PO TABS
5.0000 mg | ORAL_TABLET | Freq: Every day | ORAL | 0 refills | Status: DC
Start: 2023-02-12 — End: 2023-02-21

## 2023-02-12 NOTE — Progress Notes (Unsigned)
Acute Office Visit  Subjective:    Patient ID: [name], male    DOB: 01/21/1946, 77 y.o.   MRN: [id number]  Chief Complaint  Patient presents with   Head Injury    Follow up     HPI Patient is in today for f/u of headache since head injury on 01/05/23. He complains of soreness in forehead area since then.  He was at a dealership office and hit glasswall on 01/15/23 while walking out of the office.  He did not lose consciousness.  Denies any prodromal symptoms such as chest pain, dyspnea or palpitations.  Denies any seizure-like activity.  Except for mild headache, he denies any visual disturbance, double vision, focal numbness or weakness of UE or LE.  He is currently on Eliquis for PAF and Plavix for CAD s/p stent placement. Due to persistent headache, he had CT of head done, but result is not available for review.  His blood pressure was elevated today, has been elevated at home, in 140s/70s.  He was advised to take amlodipine 10 mg instead of 5 mg, but he had worsening of headache (?) with it.  He has started taking amlodipine only 5 mg as advised by Dr. Durwin Nora.  He denies any chest pain, dyspnea or palpitations.  He reports being anxious due to her mother's death in the last week. He has lack of sleep as well. Denies delusion, hallucinations, SI or HI currently.   Past Medical History:  Diagnosis Date   Anxiety    Atrial fibrillation (HCC)    Cancer (HCC)    testicular    Chest pain    myoview 07/09/12-normal, nl ef; echo 04/12/05-ef>55%, mild aortic sclerosis   GERD (gastroesophageal reflux disease)    H. pylori infection    Headache    Hypercholesteremia    Hyperlipidemia    Hypertension    Hyponatremia 2013   Palpitations    event monior 04/11/05- SR with occ PAC   Personal history of colonic polyps 2 9 2007    Past Surgical History:  Procedure Laterality Date   BACK SURGERY     BIOPSY  04/17/2022   Procedure: BIOPSY;  Surgeon: Dolores Frame, MD;   Location: [facility];  Service: Gastroenterology;;   CATARACT EXTRACTION W/PHACO Left 12/08/2012   Procedure: CATARACT EXTRACTION PHACO AND INTRAOCULAR LENS PLACEMENT (IOC);  Surgeon: Gemma Payor, MD;  Location: [facility];  Service: Ophthalmology;  Laterality: Left;  CDE:  23.19   CATARACT EXTRACTION W/PHACO Right 04/15/2014   Procedure: CATARACT EXTRACTION PHACO AND INTRAOCULAR LENS PLACEMENT RIGHT;  Surgeon: Gemma Payor, MD;  Location: [facility];  Service: Ophthalmology;  Laterality: Right;  CDE:10.32   COLONOSCOPY  12/06/2010   Procedure: COLONOSCOPY;  Surgeon: Malissa Hippo, MD;  Location: [facility];  Service: Endoscopy;  Laterality: N/A;   COLONOSCOPY N/A 05/23/2016   Procedure: COLONOSCOPY;  Surgeon: Malissa Hippo, MD;  [facility]: [facility];  Service: Endoscopy;  Laterality: N/A;  930   COLONOSCOPY WITH PROPOFOL N/A 06/28/2021   Procedure: COLONOSCOPY WITH PROPOFOL;  Surgeon: Malissa Hippo, MD;  [facility]: [facility];  Service: Endoscopy;  Laterality: N/A;  1045   CORONARY STENT INTERVENTION N/A 02/24/2018   Procedure: CORONARY STENT INTERVENTION;  Surgeon: [name], MD;  [facility]: [facility];  Service: Cardiovascular;  Laterality: N/A;   ESOPHAGOGASTRODUODENOSCOPY N/A 07/20/2015   Procedure: ESOPHAGOGASTRODUODENOSCOPY (EGD);  Surgeon: Malissa Hippo, MD;  [facility]: [facility];  Service:  Endoscopy;  Laterality: N/A;  2:55 - moved to 1:55 - Ann notified pt   ESOPHAGOGASTRODUODENOSCOPY (EGD) WITH PROPOFOL N/A 04/17/2022   Procedure: ESOPHAGOGASTRODUODENOSCOPY (EGD) WITH PROPOFOL;  Surgeon: Dolores Frame, MD;  [facility]: [facility];  Service: Gastroenterology;  Laterality: N/A;  215pm, asa 3   EYE SURGERY     LEFT HEART CATH AND CORONARY ANGIOGRAPHY N/A 02/24/2018   Procedure: LEFT HEART CATH AND CORONARY ANGIOGRAPHY;  Surgeon: [name], MD;  [facility]: [facility];  Service: Cardiovascular;  Laterality: N/A;   LUMBAR DISC SURGERY Left 2007    "L4-5 ruptured discs"   POLYPECTOMY  05/23/2016   Procedure: POLYPECTOMY;  Surgeon: Malissa Hippo, MD;  [facility];  Service: Endoscopy;;  colon   POLYPECTOMY  06/28/2021   Procedure: POLYPECTOMY;  Surgeon: Malissa Hippo, MD;  [facility]: [facility];  Service: Endoscopy;;  splenic flexure;hepatic flexurex2; cecal polyp;transverse colon polyp;rectal polyp;    Family History  Problem Relation Age of Onset   Hypertension Father    Hyperlipidemia Father    CAD Father        H/O CABG    Social History   Socioeconomic History   Marital status: Married    Spouse name: Not on file   Number of children: 1   Years of education: Not on file   Highest education level: Not on file  Occupational History   Not on file  Tobacco Use   Smoking status: Never    Passive exposure: Past   Smokeless tobacco: Never  Vaping Use   Vaping status: Never Used  Substance and Sexual Activity   Alcohol use: No   Drug use: No   Sexual activity: Not Currently  Other Topics Concern   Not on file  Social History Narrative   ** Merged History Encounter **       Social Determinants of Health   Financial Resource Strain: Low Risk  (02/25/2018)   Overall Financial Resource Strain (CARDIA)    Difficulty of Paying Living Expenses: Not hard at all  Food Insecurity: No Food Insecurity (03/29/2022)   Hunger Vital Sign    Worried About Running Out of Food in the Last Year: Never true    Ran Out of Food in the Last Year: Never true  Transportation Needs: No Transportation Needs (03/29/2022)   PRAPARE - Administrator, Civil Service (Medical): No    Lack of Transportation (Non-Medical): No  Physical Activity: Insufficiently Active (02/25/2018)   Exercise Vital Sign    Days of Exercise per Week: 3 days    Minutes of Exercise per Session: 10 min  Stress: Stress Concern Present (02/25/2018)   [employer] of Occupational Health - Occupational Stress Questionnaire    Feeling of  Stress : Rather much  Social Connections: Not on file  Intimate Partner Violence: Not At Risk (03/29/2022)   Humiliation, Afraid, Rape, and Kick questionnaire    Fear of Current or Ex-Partner: No    Emotionally Abused: No    Physically Abused: No    Sexually Abused: No    Outpatient Medications Prior to Visit  Medication Sig Dispense Refill   acetaminophen (TYLENOL) 500 MG tablet Take 250 mg by mouth 2 (two) times daily as needed for moderate pain or headache.     ALPRAZolam (XANAX) 0.5 MG tablet Take 1 tablet (0.5 mg total) by mouth 3 (three) times daily as needed for anxiety. 30 tablet 0   apixaban (ELIQUIS) 5  MG TABS tablet Take 1 tablet (5 mg total) by mouth 2 (two) times daily. 180 tablet 3   atorvastatin (LIPITOR) 80 MG tablet TAKE 1 TABLET BY MOUTH AT BEDTIME. 90 tablet 3   clopidogrel (PLAVIX) 75 MG tablet Take 1 tablet (75 mg total) by mouth daily. 90 tablet 3   diltiazem (CARDIZEM CD) 120 MG 24 hr capsule Take 1 capsule (120 mg total) by mouth daily. 90 capsule 3   isosorbide mononitrate (IMDUR) 30 MG 24 hr tablet Take 1 tablet (30 mg total) by mouth daily. 90 tablet 3   metoprolol tartrate (LOPRESSOR) 25 MG tablet Take 1 tablet (25 mg total) by mouth 2 (two) times daily. 180 tablet 3   pantoprazole (PROTONIX) 40 MG tablet Take 1 tablet (40 mg total) by mouth daily. 90 tablet 0   predniSONE (DELTASONE) 5 MG tablet Take 1 tablet (5 mg total) by mouth daily as needed (as needed for knee pain). As needed for knee pain 10 tablet 0   rosuvastatin (CRESTOR) 40 MG tablet Take 1 tablet (40 mg total) by mouth daily. 90 tablet 3   amLODipine (NORVASC) 5 MG tablet Take 1 tablet (5 mg total) by mouth daily. 90 tablet 3   No facility-administered medications prior to visit.    Allergies  Allergen Reactions   Lisinopril Swelling    H/o of angioedema    Chlorthalidone Other (See Comments)    Causes severe hyponatremia   Flexeril [Cyclobenzaprine]     Didn't feel right   Hyoscyamine  Swelling   Prevacid [Lansoprazole] Swelling   Valium [Diazepam] Other (See Comments)    "makes me feel crazy"   Potassium-Containing Compounds Palpitations    Review of Systems  Constitutional:  Negative for chills and fever.  HENT:  Negative for congestion and sore throat.   Eyes:  Negative for pain and discharge.  Respiratory:  Negative for cough and shortness of breath.   Cardiovascular:  Negative for chest pain and palpitations.  Gastrointestinal:  Negative for diarrhea, nausea and vomiting.  Endocrine: Negative for polydipsia and polyuria.  Genitourinary:  Negative for dysuria and hematuria.  Musculoskeletal:  Negative for neck pain and neck stiffness.  Skin:  Negative for rash.  Neurological:  Negative for dizziness, weakness and numbness.       Soreness of forehead  Psychiatric/Behavioral:  Positive for sleep disturbance. Negative for agitation and behavioral problems. The patient is nervous/anxious.        Objective:    Physical Exam Vitals reviewed.  Constitutional:      General: He is not in acute distress.    Appearance: He is not diaphoretic.  HENT:     Head: Normocephalic.     Comments: Small bump over forehead, about 1 cm X 0.5 cm area, no periorbital hematoma changes    Nose: Nose normal.  Eyes:     General: No scleral icterus.    Extraocular Movements: Extraocular movements intact.  Cardiovascular:     Rate and Rhythm: Normal rate and regular rhythm.     Heart sounds: Normal heart sounds. No murmur heard. Pulmonary:     Breath sounds: Normal breath sounds. No wheezing or rales.  Musculoskeletal:     Cervical back: Neck supple. No tenderness.     Right lower leg: No edema.     Left lower leg: No edema.  Skin:    General: Skin is warm.     Findings: No rash.  Neurological:     General: No focal deficit present.  Mental Status: He is alert and oriented to person, place, and time.     Cranial Nerves: No cranial nerve deficit.     Sensory: No  sensory deficit.     Motor: No weakness.     Coordination: Coordination normal.     Gait: Gait normal.  Psychiatric:        Mood and Affect: Mood normal.        Behavior: Behavior normal.     BP (!) 156/74 (BP Location: Left Arm)   Pulse 72   Ht 5\' 8"  (1.727 m)   Wt 148 lb 3.2 oz (67.2 kg)   SpO2 96%   BMI 22.53 kg/m  Wt Readings from Last 3 Encounters:  02/12/23 148 lb 3.2 oz (67.2 kg)  01/22/23 147 lb 3.2 oz (66.8 kg)  01/08/23 147 lb 3.2 oz (66.8 kg)        Assessment & Plan:   Essential hypertension BP Readings from Last 1 Encounters:  02/12/23 (!) 156/74   Uncontrolled with amlodipine 5 mg QD, diltiazem 120 mg QD, metoprolol 25 mg twice daily and Imdur 30 mg QD DC Amlodipine as he already takes another CCB Started Olmesartan 5 mg once daily, check BMP after 2 weeks Counseled for compliance with the medications Advised DASH diet and moderate exercise/walking, at least 150 mins/week   PAF (paroxysmal atrial fibrillation) (HCC) Currently in sinus rhythm Rate controlled with diltiazem and metoprolol On Eliquis for [facility]  Head injury Impact injury with glass wall Awaiting CT head result, called [facility] for results, but still have not received the reading yet Normal neurologic exam and absence of concerning symptoms -reassurance provided Tylenol as needed for headache - has helped with headaches so far And advised to get medical attention if any new symptoms such as worsening of headache, double vision, focal neurologic deficits such as numbness or weakness of UE or LE  Grief reaction Recently lost his mother Has sleep disturbance, can be provoking headache - otherwise he reports that he feels it is manageable    Meds ordered this encounter  Medications   olmesartan (BENICAR) 5 MG tablet    Sig: Take 1 tablet (5 mg total) by mouth daily.    Dispense:  30 tablet    Refill:  0     Riane Rung Concha Se, MD

## 2023-02-12 NOTE — Patient Instructions (Signed)
Please start taking Olmesartan 5 mg once daily. Stop taking Amlodipine for now.  Please use Flonase for nasal congestion.  Please continue to take medications as prescribed.  Please continue to follow low salt diet and perform moderate exercise/walking at least 150 mins/week.  Please get blood tests done after 2 weeks.

## 2023-02-13 ENCOUNTER — Encounter: Payer: Self-pay | Admitting: Internal Medicine

## 2023-02-13 DIAGNOSIS — F4321 Adjustment disorder with depressed mood: Secondary | ICD-10-CM | POA: Insufficient documentation

## 2023-02-13 NOTE — Assessment & Plan Note (Signed)
Currently in sinus rhythm Rate controlled with diltiazem and metoprolol On Eliquis for [facility]

## 2023-02-13 NOTE — Assessment & Plan Note (Signed)
Impact injury with glass wall Awaiting CT head result, called [facility] for results, but still have not received the reading yet Normal neurologic exam and absence of concerning symptoms -reassurance provided Tylenol as needed for headache - has helped with headaches so far And advised to get medical attention if any new symptoms such as worsening of headache, double vision, focal neurologic deficits such as numbness or weakness of UE or LE

## 2023-02-13 NOTE — Assessment & Plan Note (Addendum)
BP Readings from Last 1 Encounters:  02/12/23 (!) 156/74   Uncontrolled with amlodipine 5 mg QD, diltiazem 120 mg QD, metoprolol 25 mg twice daily and Imdur 30 mg QD DC Amlodipine as he already takes another CCB Started Olmesartan 5 mg once daily, check BMP after 2 weeks Counseled for compliance with the medications Advised DASH diet and moderate exercise/walking, at least 150 mins/week

## 2023-02-13 NOTE — Assessment & Plan Note (Signed)
Recently lost his mother Has sleep disturbance, can be provoking headache - otherwise he reports that he feels it is manageable

## 2023-02-18 ENCOUNTER — Telehealth: Payer: Self-pay

## 2023-02-18 NOTE — Telephone Encounter (Signed)
Copied from CRM [id number]. Topic: Clinical - Lab/Test Results >> Feb 18, 2023  7:49 AM Conni Elliot wrote: Reason for CRM: pt's wife Joelle Branco) called in stating they would a call back [phone]) to go over cat scan results

## 2023-02-21 ENCOUNTER — Other Ambulatory Visit: Payer: Self-pay | Admitting: Internal Medicine

## 2023-02-21 ENCOUNTER — Telehealth: Payer: Self-pay

## 2023-02-21 ENCOUNTER — Ambulatory Visit (INDEPENDENT_AMBULATORY_CARE_PROVIDER_SITE_OTHER): Payer: Medicare Other

## 2023-02-21 DIAGNOSIS — Z23 Encounter for immunization: Secondary | ICD-10-CM

## 2023-02-21 NOTE — Progress Notes (Signed)
Pt received Injection w/o complications.

## 2023-02-21 NOTE — Addendum Note (Signed)
Addended by: Ricardo Jericho R on: 02/21/2023 02:30 PM   Modules accepted: Orders

## 2023-02-21 NOTE — Progress Notes (Signed)
Pt. Received injection w/ out complications.

## 2023-02-22 NOTE — Telephone Encounter (Signed)
Yes Sir. No worries.

## 2023-03-07 ENCOUNTER — Encounter: Payer: Self-pay | Admitting: Internal Medicine

## 2023-03-07 ENCOUNTER — Ambulatory Visit (INDEPENDENT_AMBULATORY_CARE_PROVIDER_SITE_OTHER): Payer: Self-pay | Admitting: Internal Medicine

## 2023-03-07 VITALS — BP 170/68 | HR 73 | Ht 68.0 in | Wt 148.0 lb

## 2023-03-07 DIAGNOSIS — I10 Essential (primary) hypertension: Secondary | ICD-10-CM

## 2023-03-07 DIAGNOSIS — H6123 Impacted cerumen, bilateral: Secondary | ICD-10-CM | POA: Diagnosis not present

## 2023-03-07 MED ORDER — OLMESARTAN MEDOXOMIL 20 MG PO TABS
10.0000 mg | ORAL_TABLET | Freq: Every day | ORAL | 0 refills | Status: DC
Start: 2023-03-07 — End: 2023-04-29

## 2023-03-07 NOTE — Patient Instructions (Signed)
It was a pleasure to see you today.  Thank you for giving [name] the opportunity to be involved in your care.  Below is a brief recap of your visit and next steps.  We will plan to see you again in February.  Summary Ear irrigation completed today Start olmesartan 10 mg daily Continue Imdur, diltiazem, and metoprolol Follow up as scheduled in February

## 2023-03-07 NOTE — Assessment & Plan Note (Signed)
His blood pressure remained significantly elevated today.  Most recently, losartan 5 mg daily was started and amlodipine was discontinued as he is already prescribed diltiazem.  Olmesartan not listed on current medication list.  He states he continues to take a 5 mg pill but cannot recall the name. -Antihypertensive regimen reviewed.  I recommended continuing diltiazem, metoprolol, and Imdur as currently prescribed.  Start olmesartan 10 mg daily.  He was instructed to stop taking amlodipine.

## 2023-03-07 NOTE — Progress Notes (Signed)
   Acute Office Visit  Subjective:     Patient ID: [name], male    DOB: 12-Nov-1945, 77 y.o.   MRN: [id number]  Chief Complaint  Patient presents with   Tinnitus    Patient complains of humming and "crickets" congestion in his R ear starting yesterday.    Mr. Marciante presents today for an acute visit endorsing right ear discomfort.  He describes a humming sensation and "crickets" in his right ear for the last day.  He states that he has experienced similar symptoms previously and symptoms have improved with use of over-the-counter eardrops but he does not have any currently.  Review of Systems  HENT:  Positive for ear pain (Right ear discomfort x 1 day).       Objective:    BP (!) 170/68   Pulse 73   Ht 5\' 8"  (1.727 m)   Wt 148 lb (67.1 kg)   SpO2 98%   BMI 22.50 kg/m   Physical Exam Vitals reviewed.  Constitutional:      Appearance: Normal appearance.  HENT:     Head: Normocephalic and atraumatic.     Right Ear: External ear normal. There is impacted cerumen.     Left Ear: External ear normal. There is impacted cerumen.  Neurological:     Mental Status: He is alert.       Assessment & Plan:   Problem List Items Addressed This Visit       Essential hypertension - Primary   His blood pressure remained significantly elevated today.  Most recently, losartan 5 mg daily was started and amlodipine was discontinued as he is already prescribed diltiazem.  Olmesartan not listed on current medication list.  He states he continues to take a 5 mg pill but cannot recall the name. -Antihypertensive regimen reviewed.  I recommended continuing diltiazem, metoprolol, and Imdur as currently prescribed.  Start olmesartan 10 mg daily.  He was instructed to stop taking amlodipine.      Bilateral impacted cerumen   Presenting today for an acute visit endorsing a 1 day history of right ear discomfort.  Bilateral cerumen impaction noted on exam today.  Both ears were successfully  irrigated and right ear symptoms improved.      Meds ordered this encounter  Medications   olmesartan (BENICAR) 20 MG tablet    Sig: Take 0.5 tablets (10 mg total) by mouth daily.    Dispense:  45 tablet    Refill:  0    Return if symptoms worsen or fail to improve.  Billie Lade, MD

## 2023-03-07 NOTE — Assessment & Plan Note (Signed)
Presenting today for an acute visit endorsing a 1 day history of right ear discomfort.  Bilateral cerumen impaction noted on exam today.  Both ears were successfully irrigated and right ear symptoms improved.

## 2023-03-28 DIAGNOSIS — K080 Exfoliation of teeth due to systemic causes: Secondary | ICD-10-CM | POA: Diagnosis not present

## 2023-04-29 ENCOUNTER — Ambulatory Visit: Payer: Medicare Other | Attending: Cardiovascular Disease | Admitting: Cardiovascular Disease

## 2023-04-29 ENCOUNTER — Encounter: Payer: Self-pay | Admitting: Cardiovascular Disease

## 2023-04-29 VITALS — BP 164/70 | HR 61 | Ht 68.0 in | Wt 148.8 lb

## 2023-04-29 DIAGNOSIS — R0989 Other specified symptoms and signs involving the circulatory and respiratory systems: Secondary | ICD-10-CM

## 2023-04-29 DIAGNOSIS — I2510 Atherosclerotic heart disease of native coronary artery without angina pectoris: Secondary | ICD-10-CM | POA: Diagnosis not present

## 2023-04-29 DIAGNOSIS — E785 Hyperlipidemia, unspecified: Secondary | ICD-10-CM | POA: Diagnosis not present

## 2023-04-29 DIAGNOSIS — I480 Paroxysmal atrial fibrillation: Secondary | ICD-10-CM

## 2023-04-29 DIAGNOSIS — I10 Essential (primary) hypertension: Secondary | ICD-10-CM

## 2023-04-29 MED ORDER — APIXABAN 5 MG PO TABS: 5.0000 mg | ORAL_TABLET | Freq: Two times a day (BID) | ORAL | 0 refills | Status: DC

## 2023-04-29 NOTE — Progress Notes (Signed)
 04/29/2023 [name]   1946-01-31  [id number]  Primary Physician Tobi Fortes, MD Primary Cardiologist: Avanell Leigh MD FACP, [place], [place], [place]  HPI:  [name] is a 78 y.o.   well-appearing Caucasian male who I last  saw him in the office 07/04/22. He has a history of hypertension and hyperlipidemia. He had atypical chest pain here tone with a negative Myoview  07/09/12. His pain improved with proton pump inhibition. Because of recurrent chest pain he saw Cricket Doll, PA-C in the office who did obtain a 2-D echo and Myoview  perfusion study all normal. His pain has improved again on a PPI suggesting that it was related to GERD. Since I saw [name] in the office a year ago he has been well until this past Christmas when he was admitted to [facility] with A. fib with RVR. He converted to normal sinus rhythm with IV diltiazem  and was converted to by mouth Cardizem . His troponins were negative and 2-D echo was normal. Because of his elevated CHA2DsVASC2 score of 2 he was begun on Eliquis  oral anticoagulation. He saw Tera Fellows in the A. fib clinic 04/19/16 because of palpitations. He says he occasionally feels his heart skip or missed AB. She also had a more prolonged episode of PAF lasting 20 seconds. She gave him when necessary diltiazem  30 mg a day for persistently elevated heart rate. He was just seen in the emergency room 07/07/16 by Dr. Ferdinand Houseman. He was complaining of palpitations and was noted to have PVCs.   He was admitted to the hospital and had cardiac cath by myself 02/24/2018 revealing a high-grade mid RCA stenosis.  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  His EF was preserved without inferior wall motion abnormality.  He was on "triple therapy" for 1 month after which aspirin  was discontinued.  He remains on Plavix  and Eliquis .     He did see Friddie Jetty, NP in the office  12/11/2018 who added a PPI and gave him some Xanax .  He was seen in the ER 05/16/2019 with epigastric pain.  His EKG showed no acute changes.  His troponins were negative x2.  He does get occasional episodes of epigastric pain on Protonix .     Since I saw him in the office a year ago he continues to do well.  He had a Myoview  stress test 03/30/2022 which was low risk and nonischemic and 2D echo performed same time that showed normal LV systolic function without valvular abnormalities.  He is otherwise asymptomatic and denies chest pain or shortness of breath.  He walks his dog on a daily basis.   Current Meds  Medication Sig   acetaminophen  (TYLENOL ) 500 MG tablet Take 250 mg by mouth 2 (two) times daily as needed for moderate pain or headache.   ALPRAZolam  (XANAX ) 0.5 MG tablet Take 1 tablet (0.5 mg total) by mouth 3 (three) times daily as needed for anxiety.   apixaban  (ELIQUIS ) 5 MG TABS tablet Take 1 tablet (5 mg total) by mouth 2 (two) times daily.   atorvastatin  (LIPITOR ) 80 MG tablet TAKE 1 TABLET BY MOUTH AT BEDTIME.   clopidogrel  (PLAVIX ) 75 MG tablet Take 1 tablet (75 mg total) by mouth daily.   diltiazem  (CARDIZEM  CD) 120 MG 24 hr capsule Take 1 capsule (120 mg total) by mouth daily.   isosorbide  mononitrate (IMDUR ) 30 MG 24 hr tablet Take 1 tablet (  30 mg total) by mouth daily.   metoprolol  tartrate (LOPRESSOR ) 25 MG tablet Take 1 tablet (25 mg total) by mouth 2 (two) times daily.   pantoprazole  (PROTONIX ) 40 MG tablet Take 1 tablet (40 mg total) by mouth daily.   predniSONE  (DELTASONE ) 5 MG tablet TAKE 1 TABLET BY MOUTH DAILY AS NEEDED FOR KNEE PAIN.     Allergies  Allergen Reactions   Lisinopril Swelling    H/o of angioedema    Chlorthalidone  Other (See Comments)    Causes severe hyponatremia   Flexeril [Cyclobenzaprine]     Didn't feel right   Hyoscyamine  Swelling   Prevacid [Lansoprazole] Swelling   Valium [Diazepam] Other (See Comments)    "makes me feel crazy"    Potassium-Containing Compounds Palpitations    Social History   Socioeconomic History   Marital status: Married    Spouse name: Not on file   Number of children: 1   Years of education: Not on file   Highest education level: Not on file  Occupational History   Not on file  Tobacco Use   Smoking status: Never    Passive exposure: Past   Smokeless tobacco: Never  Vaping Use   Vaping status: Never Used  Substance and Sexual Activity   Alcohol  use: No   Drug use: No   Sexual activity: Not Currently  Other Topics Concern   Not on file  Social History Narrative   ** Merged History Encounter **       Social Drivers of Health   Financial Resource Strain: Low Risk  (02/25/2018)   Overall Financial Resource Strain (CARDIA)    Difficulty of Paying Living Expenses: Not hard at all  Food Insecurity: No Food Insecurity (03/29/2022)   Hunger Vital Sign    Worried About Running Out of Food in the Last Year: Never true    Ran Out of Food in the Last Year: Never true  Transportation Needs: No Transportation Needs (03/29/2022)   PRAPARE - Administrator, Civil Service (Medical): No    Lack of Transportation (Non-Medical): No  Physical Activity: Insufficiently Active (02/25/2018)   Exercise Vital Sign    Days of Exercise per Week: 3 days    Minutes of Exercise per Session: 10 min  Stress: Stress Concern Present (02/25/2018)   [employer] of Occupational Health - Occupational Stress Questionnaire    Feeling of Stress : Rather much  Social Connections: Not on file  Intimate Partner Violence: Not At Risk (03/29/2022)   Humiliation, Afraid, Rape, and Kick questionnaire    Fear of Current or Ex-Partner: No    Emotionally Abused: No    Physically Abused: No    Sexually Abused: No     Review of Systems: General: negative for chills, fever, night sweats or weight changes.  Cardiovascular: negative for chest pain, dyspnea on exertion, edema, orthopnea, palpitations,  paroxysmal nocturnal dyspnea or shortness of breath Dermatological: negative for rash Respiratory: negative for cough or wheezing Urologic: negative for hematuria Abdominal: negative for nausea, vomiting, diarrhea, bright red blood per rectum, melena, or hematemesis Neurologic: negative for visual changes, syncope, or dizziness All other systems reviewed and are otherwise negative except as noted above.    Blood pressure (!) 164/70, pulse 61, height 5\' 8"  (1.727 m), weight 148 lb 12.8 oz (67.5 kg), SpO2 99%.  General appearance: alert and no distress Neck: no adenopathy, no carotid bruit, no JVD, supple, symmetrical, trachea midline, and thyroid  not enlarged, symmetric, no tenderness/mass/nodules Lungs: clear  to auscultation bilaterally Heart: regular rate and rhythm, S1, S2 normal, no murmur, click, rub or gallop Extremities: extremities normal, atraumatic, no cyanosis or edema Pulses: 2+ and symmetric Skin: Skin color, texture, turgor normal. No rashes or lesions Neurologic: Grossly normal  EKG EKG Interpretation Date/Time:  Monday April 29 2023 12:15:54 EST Ventricular Rate:  61 PR Interval:  200 QRS Duration:  118 QT Interval:  466 QTC Calculation: 469 R Axis:   56  Text Interpretation: Normal sinus rhythm Incomplete right bundle branch block When compared with ECG of 25-Sep-2022 13:49, PREVIOUS ECG IS PRESENT Confirmed by Lauro Portal [phone]) on 04/29/2023 12:31:38 PM    ASSESSMENT AND PLAN:   Dyslipidemia History of dyslipidemia on high-dose statin therapy with recent lipid profile performed by his PCP revealing total cholesterol 45, LDL 77 and HDL 52.  Essential hypertension History of essential hypertension blood pressure measured today 164/70.  He is on diltiazem , metoprolol  and olmesartan .  I reviewed his home blood pressure log which revealed pressures in the normal range.  PAF (paroxysmal atrial fibrillation) (HCC) History of PAF maintaining sinus rhythm on  Eliquis  oral anticoagulation.  CAD (coronary artery disease) History of CAD status post cath by myself 02/24/2018 revealing a high-grade mid RCA stenosis.  He had significant dissection with balloon angioplasty requiring 4 overlapping drug-eluting stents.  He had a normal Myoview  and 2D echo last year.  He denies chest pain or shortness of breath.     Avanell Leigh MD FACP,FACC,FAHA, [facility] 04/29/2023 12:38 PM

## 2023-04-29 NOTE — Patient Instructions (Signed)
 Medication Instructions:  Your physician recommends that you continue on your current medications as directed. Please refer to the Current Medication list given to you today.  *If you need a refill on your cardiac medications before your next appointment, please call your pharmacy*   Testing/Procedures: Your physician has requested that you have a carotid duplex. This test is an ultrasound of the carotid arteries in your neck. It looks at blood flow through these arteries that supply the brain with blood. Allow one hour for this exam. There are no restrictions or special instructions. This will take place at [address] [facility], [address].  Please note: We ask at that you not bring children with you during ultrasound (echo/ vascular) testing. Due to room size and safety concerns, children are not allowed in the ultrasound rooms during exams. Our front office staff cannot provide observation of children in our lobby area while testing is being conducted. An adult accompanying a patient to their appointment will only be allowed in the ultrasound room at the discretion of the ultrasound technician under special circumstances. We apologize for any inconvenience.    Follow-Up: At [facility], you and your health needs are our priority.  As part of our continuing mission to provide you with exceptional heart care, we have created designated Provider Care Teams.  These Care Teams include your primary Cardiologist (physician) and Advanced Practice Providers (APPs -  Physician Assistants and Nurse Practitioners) who all work together to provide you with the care you need, when you need it.  We recommend signing up for the patient portal called "MyChart".  Sign up information is provided on this After Visit Summary.  MyChart is used to connect with patients for Virtual Visits (Telemedicine).  Patients are able to view lab/test results, encounter notes, upcoming appointments, etc.  Non-urgent messages  can be sent to your provider as well.   To learn more about what you can do with MyChart, go to [email or web address].    Your next appointment:   12 month(s)  Provider:   Nanetta Batty, MD     Other Instructions

## 2023-04-29 NOTE — Assessment & Plan Note (Signed)
 History of essential hypertension blood pressure measured today 164/70.  He is on diltiazem , metoprolol  and olmesartan .  I reviewed his home blood pressure log which revealed pressures in the normal range.

## 2023-04-29 NOTE — Assessment & Plan Note (Signed)
 History of PAF maintaining sinus rhythm on Eliquis oral anticoagulation.

## 2023-04-29 NOTE — Assessment & Plan Note (Signed)
 History of dyslipidemia on high-dose statin therapy with recent lipid profile performed by his PCP revealing total cholesterol 45, LDL 77 and HDL 52.

## 2023-04-29 NOTE — Assessment & Plan Note (Signed)
 History of CAD status post cath by myself 02/24/2018 revealing a high-grade mid RCA stenosis.  He had significant dissection with balloon angioplasty requiring 4 overlapping drug-eluting stents.  He had a normal Myoview  and 2D echo last year.  He denies chest pain or shortness of breath.

## 2023-04-30 ENCOUNTER — Ambulatory Visit: Payer: Medicare Other | Admitting: Internal Medicine

## 2023-05-01 ENCOUNTER — Other Ambulatory Visit: Payer: Self-pay | Admitting: Internal Medicine

## 2023-05-06 ENCOUNTER — Ambulatory Visit: Payer: Medicare Other | Admitting: Cardiovascular Disease

## 2023-05-10 ENCOUNTER — Ambulatory Visit (HOSPITAL_COMMUNITY)
Admission: RE | Admit: 2023-05-10 | Discharge: 2023-05-10 | Disposition: A | Discharge status: Home / Self Care | Payer: Medicare Other | Source: Ambulatory Visit | Attending: Cardiovascular Disease | Admitting: Cardiovascular Disease

## 2023-05-10 DIAGNOSIS — R0989 Other specified symptoms and signs involving the circulatory and respiratory systems: Secondary | ICD-10-CM | POA: Insufficient documentation

## 2023-05-15 DIAGNOSIS — H35371 Puckering of macula, right eye: Secondary | ICD-10-CM | POA: Diagnosis not present

## 2023-05-29 ENCOUNTER — Encounter (HOSPITAL_COMMUNITY): Payer: Medicare Other

## 2023-06-03 ENCOUNTER — Other Ambulatory Visit (INDEPENDENT_AMBULATORY_CARE_PROVIDER_SITE_OTHER): Payer: Self-pay | Admitting: Gastroenterology

## 2023-06-03 DIAGNOSIS — K589 Irritable bowel syndrome without diarrhea: Secondary | ICD-10-CM

## 2023-06-03 DIAGNOSIS — R1013 Epigastric pain: Secondary | ICD-10-CM

## 2023-06-10 ENCOUNTER — Other Ambulatory Visit: Payer: Self-pay | Admitting: Pharmacist

## 2023-06-10 ENCOUNTER — Telehealth: Payer: Self-pay | Admitting: Internal Medicine

## 2023-06-10 MED ORDER — ATORVASTATIN CALCIUM 40 MG PO TABS: 80.0000 mg | ORAL_TABLET | Freq: Every day | ORAL | 3 refills | Status: DC

## 2023-06-10 NOTE — Telephone Encounter (Signed)
error 

## 2023-06-10 NOTE — Telephone Encounter (Signed)
 Patient having issues swallowing the 80mg  tablet. Said when he cuts them he gets a bitter taste. Requesting 2 40mg  tablets

## 2023-06-11 ENCOUNTER — Encounter: Payer: Self-pay | Admitting: Internal Medicine

## 2023-06-11 ENCOUNTER — Ambulatory Visit: Admitting: Internal Medicine

## 2023-06-11 VITALS — BP 154/72 | HR 93 | Ht 68.0 in | Wt 147.0 lb

## 2023-06-11 DIAGNOSIS — I10 Essential (primary) hypertension: Secondary | ICD-10-CM | POA: Diagnosis not present

## 2023-06-11 DIAGNOSIS — S0990XA Unspecified injury of head, initial encounter: Secondary | ICD-10-CM | POA: Diagnosis not present

## 2023-06-11 DIAGNOSIS — I480 Paroxysmal atrial fibrillation: Secondary | ICD-10-CM

## 2023-06-11 MED ORDER — PREDNISONE 5 MG PO TABS: 5.0000 mg | ORAL_TABLET | Freq: Every day | ORAL | 0 refills | Status: DC | PRN

## 2023-06-11 MED ORDER — OLMESARTAN MEDOXOMIL 20 MG PO TABS: 10.0000 mg | ORAL_TABLET | Freq: Every day | ORAL | 2 refills | Status: DC

## 2023-06-11 NOTE — Assessment & Plan Note (Signed)
 His most recently prescribed antihypertensive regimen includes diltiazem 120 mg daily, metoprolol tartrate 25 mg twice daily, and olmesartan 10 mg daily.  He recently stopped taking olmesartan and started taking amlodipine again because he thought olmesartan made his A-fib worse.  Amlodipine previously discontinued as he is already taking another CCB. -Treatment options reviewed.  I recommended resuming olmesartan as previously prescribed and stopping amlodipine.  Refill provided.  Continue diltiazem and metoprolol as currently prescribed.  He will return to care for follow-up in June.

## 2023-06-11 NOTE — Patient Instructions (Signed)
 It was a pleasure to see you today.  Thank you for giving [name] the opportunity to be involved in your care.  Below is a brief recap of your visit and next steps.  We will plan to see you again in June  Summary Blood pressure medications: Olmesartan 10 mg daily Diltiazem 120 mg daily Metoprolol 25 mg twice daily  Prednisone refilled  Follow up in June

## 2023-06-11 NOTE — Assessment & Plan Note (Signed)
 Presenting today for an acute visit in the setting of recent head injury.  As otherwise documented, his dog jumped and hit him in the forehead 10 days ago.  No LOC.  Neuroexam today is unremarkable.  He endorses a dull, intermittent frontal headache that is slowly resolving.  Currently prescribed Eliquis and Plavix, which is what prompted him to present today.  Reassurance provided today given normal neurologic exam and resolving headache.  He was instructed to return to care if symptoms worsen.

## 2023-06-11 NOTE — Progress Notes (Signed)
 Acute Office Visit  Subjective:     Patient ID: [name], male    DOB: [birth year], 78 y.o.   MRN: [id number]  Chief Complaint  Patient presents with   Care Management    Pt. States since dog hit him in the forehead he's been having headaches ever since been going on about a week.   Mr. Scoggin presents today for an acute visit in the setting of recent head injury.  He states that he was leaning over to pet his dog 10 days ago when the dog jumped and hit him in the forehead.  He did not lose consciousness.  Denies dizziness.  He has had a dull, intermittent frontal headache since the injury.  Today he states that his symptoms are gradually improving.  He is chiefly concerned because he is taking Eliquis and Plavix.  His additional concern is to review his current antihypertensive regimen.  Review of Systems  Neurological:  Positive for headaches.      Objective:    BP (!) 154/72 (BP Location: Left Arm, Patient Position: Sitting, Cuff Size: Normal)   Pulse 93   Ht 5\' 8"  (1.727 m)   Wt 147 lb (66.7 kg)   SpO2 98%   BMI 22.35 kg/m   Physical Exam Vitals reviewed.  Constitutional:      General: He is not in acute distress.    Appearance: Normal appearance. He is not ill-appearing.  HENT:     Head: Normocephalic and atraumatic.     Right Ear: External ear normal.     Left Ear: External ear normal.     Nose: Nose normal. No congestion or rhinorrhea.     Mouth/Throat:     Mouth: Mucous membranes are moist.     Pharynx: Oropharynx is clear.  Eyes:     General: No scleral icterus.    Extraocular Movements: Extraocular movements intact.     Conjunctiva/sclera: Conjunctivae normal.     Pupils: Pupils are equal, round, and reactive to light.  Cardiovascular:     Rate and Rhythm: Normal rate and regular rhythm.     Pulses: Normal pulses.     Heart sounds: Normal heart sounds. No murmur heard. Pulmonary:     Effort: Pulmonary effort is normal.     Breath sounds: Normal  breath sounds. No wheezing, rhonchi or rales.  Abdominal:     General: Abdomen is flat. Bowel sounds are normal. There is no distension.     Palpations: Abdomen is soft.     Tenderness: There is no abdominal tenderness.  Musculoskeletal:        General: No swelling or deformity. Normal range of motion.     Cervical back: Normal range of motion.  Skin:    General: Skin is warm and dry.     Capillary Refill: Capillary refill takes less than 2 seconds.  Neurological:     General: No focal deficit present.     Mental Status: He is alert and oriented to person, place, and time.     Motor: No weakness.  Psychiatric:        Mood and Affect: Mood normal.        Behavior: Behavior normal.        Thought Content: Thought content normal.       Assessment & Plan:   Problem List Items Addressed This Visit       Essential hypertension - Primary   His most recently prescribed antihypertensive regimen includes diltiazem 120  mg daily, metoprolol tartrate 25 mg twice daily, and olmesartan 10 mg daily.  He recently stopped taking olmesartan and started taking amlodipine again because he thought olmesartan made his A-fib worse.  Amlodipine previously discontinued as he is already taking another CCB. -Treatment options reviewed.  I recommended resuming olmesartan as previously prescribed and stopping amlodipine.  Refill provided.  Continue diltiazem and metoprolol as currently prescribed.  He will return to care for follow-up in June.      Head injury   Presenting today for an acute visit in the setting of recent head injury.  As otherwise documented, his dog jumped and hit him in the forehead 10 days ago.  No LOC.  Neuroexam today is unremarkable.  He endorses a dull, intermittent frontal headache that is slowly resolving.  Currently prescribed Eliquis and Plavix, which is what prompted him to present today.  Reassurance provided today given normal neurologic exam and resolving headache.  He was  instructed to return to care if symptoms worsen.      Meds ordered this encounter  Medications   predniSONE (DELTASONE) 5 MG tablet    Sig: Take 1 tablet (5 mg total) by mouth daily as needed.    Dispense:  10 tablet    Refill:  0   olmesartan (BENICAR) 20 MG tablet    Sig: Take 0.5 tablets (10 mg total) by mouth daily.    Dispense:  30 tablet    Refill:  2    Return if symptoms worsen or fail to improve.  Billie Lade, MD

## 2023-06-13 ENCOUNTER — Telehealth: Payer: Self-pay | Admitting: Pharmacy Technician

## 2023-06-13 ENCOUNTER — Other Ambulatory Visit (HOSPITAL_COMMUNITY): Payer: Self-pay

## 2023-06-13 NOTE — Telephone Encounter (Signed)
 Pharmacy Patient Advocate Encounter   Received notification from Fax that prior authorization for atorvastatin is required/requested.   Insurance verification completed.   The patient is insured through [employer] .   Per test claim: PA required; PA submitted to above mentioned insurance via CoverMyMeds Key/confirmation #/EOC  [id number] Status is pending

## 2023-06-14 NOTE — Telephone Encounter (Signed)
 Pharmacy Patient Advocate Encounter  Received notification from [facility] that Prior Authorization for atorvastatin has been DENIED.  Full denial letter will be uploaded to the media tab. See denial reason below.   PA #/Case ID/Reference #: [id number]

## 2023-06-17 ENCOUNTER — Other Ambulatory Visit: Payer: Self-pay

## 2023-06-17 ENCOUNTER — Encounter (HOSPITAL_COMMUNITY): Payer: Self-pay

## 2023-06-17 ENCOUNTER — Emergency Department (HOSPITAL_COMMUNITY)
Admission: EM | Admit: 2023-06-17 | Discharge: 2023-06-17 | Disposition: A | Discharge status: Home / Self Care | Attending: Emergency Medicine | Admitting: Emergency Medicine

## 2023-06-17 ENCOUNTER — Emergency Department (HOSPITAL_COMMUNITY)

## 2023-06-17 DIAGNOSIS — I6523 Occlusion and stenosis of bilateral carotid arteries: Secondary | ICD-10-CM | POA: Diagnosis not present

## 2023-06-17 DIAGNOSIS — Z7901 Long term (current) use of anticoagulants: Secondary | ICD-10-CM | POA: Insufficient documentation

## 2023-06-17 DIAGNOSIS — I700 Atherosclerosis of aorta: Secondary | ICD-10-CM | POA: Diagnosis not present

## 2023-06-17 DIAGNOSIS — S0990XA Unspecified injury of head, initial encounter: Secondary | ICD-10-CM | POA: Diagnosis not present

## 2023-06-17 DIAGNOSIS — I10 Essential (primary) hypertension: Secondary | ICD-10-CM | POA: Diagnosis not present

## 2023-06-17 DIAGNOSIS — I2510 Atherosclerotic heart disease of native coronary artery without angina pectoris: Secondary | ICD-10-CM | POA: Diagnosis not present

## 2023-06-17 DIAGNOSIS — J929 Pleural plaque without asbestos: Secondary | ICD-10-CM | POA: Diagnosis not present

## 2023-06-17 DIAGNOSIS — S060X0A Concussion without loss of consciousness, initial encounter: Secondary | ICD-10-CM | POA: Diagnosis not present

## 2023-06-17 DIAGNOSIS — W01198A Fall on same level from slipping, tripping and stumbling with subsequent striking against other object, initial encounter: Secondary | ICD-10-CM | POA: Insufficient documentation

## 2023-06-17 DIAGNOSIS — R079 Chest pain, unspecified: Secondary | ICD-10-CM | POA: Diagnosis not present

## 2023-06-17 DIAGNOSIS — R519 Headache, unspecified: Secondary | ICD-10-CM | POA: Diagnosis not present

## 2023-06-17 LAB — COMPREHENSIVE METABOLIC PANEL WITH GFR
ALT: 18 U/L (ref 0–44)
AST: 23 U/L (ref 15–41)
Albumin: 3.5 g/dL (ref 3.5–5.0)
Alkaline Phosphatase: 82 U/L (ref 38–126)
Anion gap: 7 (ref 5–15)
BUN: 11 mg/dL (ref 8–23)
CO2: 25 mmol/L (ref 22–32)
Calcium: 8.7 mg/dL — ABNORMAL LOW (ref 8.9–10.3)
Chloride: 105 mmol/L (ref 98–111)
Creatinine, Ser: 0.93 mg/dL (ref 0.61–1.24)
GFR, Estimated: >60 mL/min (ref >60)
Glucose, Bld: 149 mg/dL — ABNORMAL HIGH (ref 70–99)
Potassium: 3.6 mmol/L (ref 3.5–5.1)
Sodium: 137 mmol/L (ref 135–145)
Total Bilirubin: 0.4 mg/dL (ref 0.0–1.2)
Total Protein: 6.8 g/dL (ref 6.5–8.1)

## 2023-06-17 LAB — CBC WITH DIFFERENTIAL/PLATELET
Abs Immature Granulocytes: 0.01 10*3/uL (ref 0.00–0.07)
Basophils Absolute: 0.1 10*3/uL (ref 0.0–0.1)
Basophils Relative: 1 %
Eosinophils Absolute: 0.3 10*3/uL (ref 0.0–0.5)
Eosinophils Relative: 5 %
HCT: 34.7 % — ABNORMAL LOW (ref 39.0–52.0)
Hemoglobin: 11.5 g/dL — ABNORMAL LOW (ref 13.0–17.0)
Immature Granulocytes: 0 %
Lymphocytes Relative: 22 %
Lymphs Abs: 1.4 10*3/uL (ref 0.7–4.0)
MCH: 30.4 pg (ref 26.0–34.0)
MCHC: 33.1 g/dL (ref 30.0–36.0)
MCV: 91.8 fL (ref 80.0–100.0)
Monocytes Absolute: 0.6 10*3/uL (ref 0.1–1.0)
Monocytes Relative: 10 %
Neutro Abs: 4.1 10*3/uL (ref 1.7–7.7)
Neutrophils Relative %: 62 %
Platelets: 222 10*3/uL (ref 150–400)
RBC: 3.78 MIL/uL — ABNORMAL LOW (ref 4.22–5.81)
RDW: 13.1 % (ref 11.5–15.5)
WBC: 6.6 10*3/uL (ref 4.0–10.5)
nRBC: 0 % (ref 0.0–0.2)

## 2023-06-17 LAB — TYPE AND SCREEN
ABO/RH(D): O NEG
Antibody Screen: NEGATIVE

## 2023-06-17 LAB — TROPONIN I (HIGH SENSITIVITY)
Troponin I (High Sensitivity): 6 ng/L (ref <18)
Troponin I (High Sensitivity): 7 ng/L (ref <18)

## 2023-06-17 MED ORDER — AMLODIPINE BESYLATE 5 MG PO TABS: 5.0000 mg | ORAL_TABLET | Freq: Every day | ORAL | 0 refills | Status: DC

## 2023-06-17 MED ORDER — ACETAMINOPHEN 500 MG PO TABS
1000.0000 mg | ORAL_TABLET | Freq: Once | ORAL | Status: AC
  Administered 2023-06-17: 1000 mg via ORAL
  Filled 2023-06-17: qty 2

## 2023-06-17 MED ORDER — OLMESARTAN MEDOXOMIL 20 MG PO TABS: 20.0000 mg | ORAL_TABLET | Freq: Every day | ORAL | 0 refills | Status: DC

## 2023-06-17 NOTE — ED Triage Notes (Signed)
 Pt arrived via POV c/o hypertension, headache and mild chest discomfort. Pt reports taking medications as prescribed.

## 2023-06-17 NOTE — Discharge Instructions (Addendum)
 We evaluated you for your headache.  Your testing in the emergency department is reassuring.  Your CT scan did not show any bleeding in your brain.  We discussed resuming your amlodipine.  I reviewed your medication list and you are already taking diltiazem, which is a very similar medicine to amlodipine.  I would not recommend taking amlodipine.  Diltiazem should have the same effect on your blood pressure.  I did increase your Benicar dose to 20 mg.  Please follow-up closely with your primary doctor so that they can monitor your blood pressure.  Please return for any new or worsening symptoms such as severe headaches.

## 2023-06-17 NOTE — ED Provider Notes (Signed)
 [place] EMERGENCY DEPARTMENT AT [facility] Provider Note  CSN: [id number] Arrival date & time: 06/17/23 1341  Chief Complaint(s) Hypertension  HPI [name] is a 78 y.o. male history of hypertension, hyperlipidemia coronary artery disease, atrial fibrillation on Eliquis presenting with headache.  Patient reports that he has had on and off headaches for the past 2 weeks, after falling and hitting his head.  Reports that he had a bruise to his forehead.  Saw his primary doctor, who told him he did not need further testing.  He noticed today that his blood pressure was high, and he was concerned given his headache and decided to come to the emergency department.  He also reports that he had some vague chest pain that started this morning which is currently resolved.  No radiation of the pain.  No shortness of breath.  No numbness or tingling, trouble swallowing, weakness, trouble walking, vision changes, fevers or chills, neck pain, or any other new symptoms.   Past Medical History Past Medical History:  Diagnosis Date   Anxiety    Atrial fibrillation (HCC)    Cancer (HCC)    testicular    Chest pain    myoview 07/09/12-normal, nl ef; echo 04/12/05-ef>55%, mild aortic sclerosis   GERD (gastroesophageal reflux disease)    H. pylori infection    Headache    Hypercholesteremia    Hyperlipidemia    Hypertension    Hyponatremia 2013   Palpitations    event monior 04/11/05- SR with occ PAC   Personal history of colonic polyps 2 9 2007   Patient Active Problem List   Diagnosis Date Noted   Grief reaction 02/13/2023   Head injury 01/22/2023   Hematoma of leg, right, initial encounter 01/08/2023   Strain of abdominal muscle 11/20/2022   Chronic musculoskeletal pain 11/05/2022   Loss of weight 07/09/2022   Encounter for general adult medical examination with abnormal findings 07/02/2022   Abdominal pain 06/06/2022   Chest pain 03/29/2022   Fracture of distal phalanx of  left thumb 09/13/2021   Bilateral hearing loss 01/06/2021   Bilateral impacted cerumen 01/06/2021   Neck swelling 01/06/2021   Bloating 05/12/2019   Non-ST elevation (NSTEMI) myocardial infarction [facility])    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    CAD (coronary artery disease) 02/24/2018   Family history of coronary artery disease in father 02/19/2018   Chronic anticoagulation 02/18/2018   Throat pain in adult 01/19/2017   PAF (paroxysmal atrial fibrillation) (HCC) 03/13/2016   History of colonic polyps 12/15/2015   Palpitations 02/25/2015   Lumbago 01/01/2013   Unstable angina (HCC) 08/08/2012   Essential hypertension 08/08/2012   Chronic diarrhea 10/23/2010   Epigastric pain 10/23/2010   Anxiety    Dyslipidemia    GERD (gastroesophageal reflux disease)    COLONIC POLYPS 11/23/2008   IBS 11/23/2008   LOSS OF APPETITE 11/23/2008   EPIGASTRIC PAIN 11/23/2008   HEARTBURN, HX OF 11/23/2008   Home Medication(s) Prior to Admission medications   Medication Sig Start Date End Date Taking? Authorizing Provider  acetaminophen (TYLENOL) 500 MG tablet Take 250 mg by mouth 2 (two) times daily as needed for moderate pain or headache.    [provider]  ALPRAZolam Prudy Feeler) 0.5 MG tablet Take 1 tablet (0.5 mg total) by mouth 3 (three) times daily as needed for anxiety. 10/26/22   Billie Lade, MD  apixaban (ELIQUIS) 5 MG TABS tablet Take 1 tablet (5 mg  total) by mouth 2 (two) times daily. 04/29/23   [name], MD  atorvastatin (LIPITOR) 40 MG tablet Take 2 tablets (80 mg total) by mouth daily. 06/10/23 09/08/23  [name], MD  clopidogrel (PLAVIX) 75 MG tablet Take 1 tablet (75 mg total) by mouth daily. 07/04/22   [name], MD  diltiazem (CARDIZEM CD) 120 MG 24 hr capsule Take 1 capsule (120 mg total) by mouth daily. 07/04/22   [name], MD  isosorbide mononitrate (IMDUR) 30 MG 24 hr tablet Take 1 tablet (30 mg  total) by mouth daily. 07/04/22   [name], MD  metoprolol tartrate (LOPRESSOR) 25 MG tablet Take 1 tablet (25 mg total) by mouth 2 (two) times daily. 07/04/22   [name], MD  olmesartan (BENICAR) 20 MG tablet Take 1 tablet (20 mg total) by mouth daily. 06/17/23   Lonell Grandchild, MD  pantoprazole (PROTONIX) 40 MG tablet TAKE ONE TABLET BY MOUTH ONCE DAILY. 06/03/23   Carlan, Chelsea L, NP  predniSONE (DELTASONE) 5 MG tablet Take 1 tablet (5 mg total) by mouth daily as needed. 06/11/23   Billie Lade, MD                                                                                                                                    Past Surgical History Past Surgical History:  Procedure Laterality Date   BACK SURGERY     BIOPSY  04/17/2022   Procedure: BIOPSY;  Surgeon: Dolores Frame, MD;  Location: [facility];  Service: Gastroenterology;;   CATARACT EXTRACTION W/PHACO Left 12/08/2012   Procedure: CATARACT EXTRACTION PHACO AND INTRAOCULAR LENS PLACEMENT (IOC);  Surgeon: Gemma Payor, MD;  Location: [facility] OR[facility];  Service: Ophthalmology;  Laterality: Left;  CDE:  23.19   CATARACT EXTRACTION W/PHACO Right 04/15/2014   Procedure: CATARACT EXTRACTION PHACO AND INTRAOCULAR LENS PLACEMENT RIGHT;  Surgeon: Gemma Payor, MD;  Location: [facility];  Service: Ophthalmology;  Laterality: Right;  CDE:10.32   COLONOSCOPY  12/06/2010   Procedure: COLONOSCOPY;  Surgeon: Malissa Hippo, MD;  Location: [facility];  Service: Endoscopy;  Laterality: N/A;   COLONOSCOPY N/A 05/23/2016   Procedure: COLONOSCOPY;  Surgeon: Malissa Hippo, MD;  Location: [facility];  Service: Endoscopy;  Laterality: N/A;  930   COLONOSCOPY WITH PROPOFOL N/A 06/28/2021   Procedure: COLONOSCOPY WITH PROPOFOL;  Surgeon: Malissa Hippo, MD;  Location: [facility];  Service: Endoscopy;  Laterality: N/A;  1045   CORONARY STENT INTERVENTION N/A 02/24/2018   Procedure: CORONARY STENT INTERVENTION;  Surgeon: [name], MD;  Location: [facility];  Service: Cardiovascular;  Laterality: N/A;   ESOPHAGOGASTRODUODENOSCOPY N/A 07/20/2015   Procedure: ESOPHAGOGASTRODUODENOSCOPY (EGD);  Surgeon: Malissa Hippo, MD;  Location: [facility];  Service: Endoscopy;  Laterality: N/A;  2:55 - moved to 1:55 - Ann notified pt  ESOPHAGOGASTRODUODENOSCOPY (EGD) WITH PROPOFOL N/A 04/17/2022   Procedure: ESOPHAGOGASTRODUODENOSCOPY (EGD) WITH PROPOFOL;  Surgeon: Dolores Frame, MD;  [facility]: [facility];  Service: Gastroenterology;  Laterality: N/A;  215pm, asa 3   EYE SURGERY     LEFT HEART CATH AND CORONARY ANGIOGRAPHY N/A 02/24/2018   Procedure: LEFT HEART CATH AND CORONARY ANGIOGRAPHY;  Surgeon: [name], MD;  Location: [facility];  Service: Cardiovascular;  Laterality: N/A;   LUMBAR DISC SURGERY Left 2007   "L4-5 ruptured discs"   POLYPECTOMY  05/23/2016   Procedure: POLYPECTOMY;  Surgeon: Malissa Hippo, MD;  [facility]: [facility];  Service: Endoscopy;;  colon   POLYPECTOMY  06/28/2021   Procedure: POLYPECTOMY;  Surgeon: Malissa Hippo, MD;  Location: [facility];  Service: Endoscopy;;  splenic flexure;hepatic flexurex2; cecal polyp;transverse colon polyp;rectal polyp;   Family History Family History  Problem Relation Age of Onset   Hypertension Father    Hyperlipidemia Father    CAD Father        H/O CABG    Social History Social History   Tobacco Use   Smoking status: Never    Passive exposure: Past   Smokeless tobacco: Never  Vaping Use   Vaping status: Never Used  Substance Use Topics   Alcohol use: No   Drug use: No   Allergies Lisinopril, Chlorthalidone, Flexeril [cyclobenzaprine], Hyoscyamine, Prevacid [lansoprazole], Valium [diazepam], and Potassium-containing compounds  Review of Systems Review of Systems  All other systems reviewed and are negative.   Physical Exam Vital Signs  I have reviewed the triage vital signs BP (!) 174/78    Pulse 60   Temp 98.2 F (36.8 C) (Oral)   Resp 18   Ht 5\' 8"  (1.727 m)   Wt 66.7 kg   SpO2 96%   BMI 22.35 kg/m  Physical Exam Vitals and nursing note reviewed.  Constitutional:      General: He is not in acute distress.    Appearance: Normal appearance.  HENT:     Head: Atraumatic.     Mouth/Throat:     Mouth: Mucous membranes are moist.  Eyes:     Conjunctiva/sclera: Conjunctivae normal.  Cardiovascular:     Rate and Rhythm: Normal rate and regular rhythm.  Pulmonary:     Effort: Pulmonary effort is normal. No respiratory distress.     Breath sounds: Normal breath sounds.  Abdominal:     General: Abdomen is flat.     Palpations: Abdomen is soft.     Tenderness: There is no abdominal tenderness.  Musculoskeletal:     Right lower leg: No edema.     Left lower leg: No edema.  Skin:    General: Skin is warm and dry.     Capillary Refill: Capillary refill takes less than 2 seconds.  Neurological:     Mental Status: He is alert and oriented to person, place, and time. Mental status is at baseline.     Comments: Cranial nerves II through XII intact, strength 5 out of 5 in the bilateral upper and lower extremities, no sensory deficit to light touch, no dysmetria on finger-nose-finger testing, ambulatory with steady gait.  Psychiatric:        Mood and Affect: Mood normal.        Behavior: Behavior normal.     ED Results and Treatments Labs (all labs ordered are listed, but only abnormal results are displayed) Labs Reviewed  CBC WITH DIFFERENTIAL/PLATELET - Abnormal; Notable for the  following components:      Result Value   RBC 3.78 (*)    Hemoglobin 11.5 (*)    HCT 34.7 (*)    All other components within normal limits  COMPREHENSIVE METABOLIC PANEL WITH GFR - Abnormal; Notable for the following components:   Glucose, Bld 149 (*)    Calcium 8.7 (*)    All other components within normal limits  TYPE AND SCREEN  TROPONIN I (HIGH SENSITIVITY)  TROPONIN I (HIGH  SENSITIVITY)                                                                                                                          Radiology DG Chest Portable 1 View Result Date: 06/17/2023 CLINICAL DATA:  Chest pain, headache. EXAM: PORTABLE CHEST 1 VIEW COMPARISON:  03/29/2022 and CT chest 09/25/2018. FINDINGS: Trachea is midline. Heart size within normal limits. Right coronary artery calcification. Thoracic aorta is calcified. Biapical pleural thickening. Lungs are clear. No pleural fluid. IMPRESSION: No acute findings. Electronically Signed   By: Leanna Battles M.D.   On: 06/17/2023 18:18   CT Head Wo Contrast Result Date: 06/17/2023 CLINICAL DATA:  Head trauma EXAM: CT HEAD WITHOUT CONTRAST TECHNIQUE: Contiguous axial images were obtained from the base of the skull through the vertex without intravenous contrast. RADIATION DOSE REDUCTION: This exam was performed according to the departmental dose-optimization program which includes automated exposure control, adjustment of the mA and/or kV according to patient size and/or use of iterative reconstruction technique. COMPARISON:  Head CT 01/29/2023 FINDINGS: Brain: No evidence of acute infarction, hemorrhage, hydrocephalus, extra-axial collection or mass lesion/mass effect. Vascular: Atherosclerotic calcifications are present within the cavernous internal carotid arteries. Skull: Normal. Negative for fracture or focal lesion. Sinuses/Orbits: No acute finding. Other: None. IMPRESSION: No acute intracranial abnormality. Electronically Signed   By: Darliss Cheney M.D.   On: 06/17/2023 18:01    Pertinent labs & imaging results that were available during my care of the patient were reviewed by me and considered in my medical decision making (see MDM for details).  Medications Ordered in ED Medications  acetaminophen (TYLENOL) tablet 1,000 mg (1,000 mg Oral Given 06/17/23 1805)                                                                                                                                      Procedures Procedures  (including critical care  time)  Medical Decision Making / ED Course   MDM:  78 year old presenting to the emergency department with headache.  Patient well-appearing, physical examination with normal neurologic exam.  Vitals notable for mild hypertension.  Suspect most likely cause of his headache is postconcussive given recent head injury and headaches have been persistent since then.  He is on Eliquis and Plavix a differential also includes intracranial bleeding, subdural hematoma.  Will obtain CT head.  Head injury was fairly distantly denies any neck pain so do not think he needs CT neck.  He also reports some mild vague chest pain.  Currently this is resolved.  He denies any other associated symptoms which be concerning like shortness of breath, vomiting, diaphoresis.  His EKG is reassuring and his troponin is negative.  Low concern for dangerous process.  Did not have chest x-ray obtained so we will obtain x-ray to evaluate for pneumothorax or pneumonia, mediastinal widening, but very low concern for any dangerous process.  If his testing is negative, anticipate discharge.  Patient is concerned about his blood pressure.  Reports he is taking his medication as prescribed.  Reports he was formally on amlodipine but stopped this.  Can probably increase his Benicar dosage  Clinical Course as of 06/17/23 1913  Mon Jun 17, 2023  1912 CT head, chest x-ray reassuring.  No intracranial bleeding.  Patient is worried about his high blood pressure.  Was previously on amlodipine and reports it was discontinued by his primary doctor.  Reviewed this, initially plan to refill this however patient is already on diltiazem.  Will instead increase his Benicar dose.  Advised that he should follow-up closely with his primary doctor for blood pressure management and keep a log of his blood pressure at home.  Instructed him not to  take or pick up prescription for amlodipine. Will discharge patient to home. All questions answered. Patient comfortable with plan of discharge. Return precautions discussed with patient and specified on the after visit summary.  [WS]    Clinical Course User Index [WS] Lonell Grandchild, MD     Additional history obtained: -Additional history obtained from spouse -External records from outside source obtained and reviewed including: Chart review including previous notes, labs, imaging, consultation notes including PMD notes    Lab Tests: -I ordered, reviewed, and interpreted labs.   The pertinent results include:   Labs Reviewed  CBC WITH DIFFERENTIAL/PLATELET - Abnormal; Notable for the following components:      Result Value   RBC 3.78 (*)    Hemoglobin 11.5 (*)    HCT 34.7 (*)    All other components within normal limits  COMPREHENSIVE METABOLIC PANEL WITH GFR - Abnormal; Notable for the following components:   Glucose, Bld 149 (*)    Calcium 8.7 (*)    All other components within normal limits  TYPE AND SCREEN  TROPONIN I (HIGH SENSITIVITY)  TROPONIN I (HIGH SENSITIVITY)    Notable for mild anemia   EKG   EKG Interpretation Date/Time:  Monday June 17 2023 13:49:58 EDT Ventricular Rate:  62 PR Interval:  190 QRS Duration:  118 QT Interval:  470 QTC Calculation: 477 R Axis:   23  Text Interpretation: Normal sinus rhythm Incomplete right bundle branch block Nonspecific T wave abnormality Prolonged QT Abnormal ECG When compared with ECG of 29-Apr-2023 12:15, No significant change was found Confirmed by Alvino Blood ([id number]) on 06/17/2023 5:43:31 PM         Imaging Studies ordered: I ordered  imaging studies including CT head CXR On my interpretation imaging demonstrates no acute process I independently visualized and interpreted imaging. I agree with the radiologist interpretation   Medicines ordered and prescription drug management: Meds ordered this  encounter  Medications   acetaminophen (TYLENOL) tablet 1,000 mg   DISCONTD: amLODipine (NORVASC) 5 MG tablet    Sig: Take 1 tablet (5 mg total) by mouth daily.    Dispense:  30 tablet    Refill:  0   olmesartan (BENICAR) 20 MG tablet    Sig: Take 1 tablet (20 mg total) by mouth daily.    Dispense:  30 tablet    Refill:  0    -I have reviewed the patients home medicines and have made adjustments as needed    Reevaluation: After the interventions noted above, I reevaluated the patient and found that their symptoms have improved  Co morbidities that complicate the patient evaluation  Past Medical History:  Diagnosis Date   Anxiety    Atrial fibrillation (HCC)    Cancer (HCC)    testicular    Chest pain    myoview 07/09/12-normal, nl ef; echo 04/12/05-ef>55%, mild aortic sclerosis   GERD (gastroesophageal reflux disease)    H. pylori infection    Headache    Hypercholesteremia    Hyperlipidemia    Hypertension    Hyponatremia 2013   Palpitations    event monior 04/11/05- SR with occ PAC   Personal history of colonic polyps 2 9 2007      Dispostion: Disposition decision including need for hospitalization was considered, and patient discharged from emergency department.    Final Clinical Impression(s) / ED Diagnoses Final diagnoses:  Concussion without loss of consciousness, initial encounter     This chart was dictated using voice recognition software.  Despite best efforts to proofread,  errors can occur which can change the documentation meaning.    Lonell Grandchild, MD 06/17/23 [phone]

## 2023-06-17 NOTE — ED Notes (Signed)
 Pt states he "bumped his head" a week or so ago and that is when he developed a headache. Then he recently had appt with PCP and told them of this, and his PCP just changed up some of his BP medications, as opposed to getting a CT or any other type of imaging done.

## 2023-06-18 ENCOUNTER — Ambulatory Visit: Payer: Medicare Other

## 2023-06-18 ENCOUNTER — Telehealth: Payer: Self-pay | Admitting: Cardiovascular Disease

## 2023-06-18 NOTE — Telephone Encounter (Signed)
 Pt c/o medication issue:  1. Name of Medication:   Medication changes  2. How are you currently taking this medication (dosage and times per day)?   3. Are you having a reaction (difficulty breathing--STAT)?   4. What is your medication issue?   Wife [name]) stated patient was in the ED yesterday for high BP readings and had medication changed.  Wife wants a call back to discuss medication changes.

## 2023-06-18 NOTE — Telephone Encounter (Signed)
 Patient identification verified by 2 forms. Shade Flood, RN     Called and spoke to patient  Patient states:  - PCP d/c AMLODIPINE and prescribed patient 10 mg olmesartan. Olmesartan was increased to 20 mg during recent ER visit.   - patient experiencing headaches since change and high blood pressure 188/  Patient denies:  - chest pain, SOB, dizziness, blurred vision, one- sided weakness, difficulty with speech at time of call.              Interventions/Plan:  - PATIENT WOULD LIKE TO MAKE SURE IT IS OKAY TO CONTINUE HOLDING AMLODIPINE AND TAKING OLMESARTAN.   - Encounter forwarded to primary cardiologist for review.    Reviewed ED warning signs/precautions  Patient agrees with plan, no questions at this time

## 2023-06-18 NOTE — Telephone Encounter (Signed)
 Patient identification verified by 2 forms. Shade Flood, RN     Tried calling, no answer. LVMTCB

## 2023-06-18 NOTE — Telephone Encounter (Signed)
 Pt spouse returning call, she asked that you call number below   [phone]

## 2023-06-19 ENCOUNTER — Emergency Department (HOSPITAL_COMMUNITY)
Admission: EM | Admit: 2023-06-19 | Discharge: 2023-06-19 | Disposition: A | Discharge status: Home / Self Care | Attending: Emergency Medicine | Admitting: Emergency Medicine

## 2023-06-19 ENCOUNTER — Emergency Department (HOSPITAL_COMMUNITY)

## 2023-06-19 ENCOUNTER — Other Ambulatory Visit: Payer: Self-pay

## 2023-06-19 ENCOUNTER — Encounter (HOSPITAL_COMMUNITY): Payer: Self-pay

## 2023-06-19 ENCOUNTER — Ambulatory Visit: Payer: Self-pay | Admitting: *Deleted

## 2023-06-19 DIAGNOSIS — Z7902 Long term (current) use of antithrombotics/antiplatelets: Secondary | ICD-10-CM | POA: Insufficient documentation

## 2023-06-19 DIAGNOSIS — I10 Essential (primary) hypertension: Secondary | ICD-10-CM | POA: Insufficient documentation

## 2023-06-19 DIAGNOSIS — Z7901 Long term (current) use of anticoagulants: Secondary | ICD-10-CM | POA: Diagnosis not present

## 2023-06-19 DIAGNOSIS — R079 Chest pain, unspecified: Secondary | ICD-10-CM | POA: Diagnosis not present

## 2023-06-19 DIAGNOSIS — Z79899 Other long term (current) drug therapy: Secondary | ICD-10-CM | POA: Diagnosis not present

## 2023-06-19 LAB — CBC
HCT: 41.2 % (ref 39.0–52.0)
Hemoglobin: 13.4 g/dL (ref 13.0–17.0)
MCH: 29.6 pg (ref 26.0–34.0)
MCHC: 32.5 g/dL (ref 30.0–36.0)
MCV: 90.9 fL (ref 80.0–100.0)
Platelets: 235 10*3/uL (ref 150–400)
RBC: 4.53 MIL/uL (ref 4.22–5.81)
RDW: 13 % (ref 11.5–15.5)
WBC: 8.4 10*3/uL (ref 4.0–10.5)
nRBC: 0 % (ref 0.0–0.2)

## 2023-06-19 LAB — BASIC METABOLIC PANEL WITH GFR
Anion gap: 11 (ref 5–15)
BUN: 10 mg/dL (ref 8–23)
CO2: 27 mmol/L (ref 22–32)
Calcium: 9.3 mg/dL (ref 8.9–10.3)
Chloride: 101 mmol/L (ref 98–111)
Creatinine, Ser: 0.76 mg/dL (ref 0.61–1.24)
GFR, Estimated: >60 mL/min (ref >60)
Glucose, Bld: 114 mg/dL — ABNORMAL HIGH (ref 70–99)
Potassium: 3.9 mmol/L (ref 3.5–5.1)
Sodium: 139 mmol/L (ref 135–145)

## 2023-06-19 LAB — TROPONIN I (HIGH SENSITIVITY): Troponin I (High Sensitivity): 6 ng/L (ref <18)

## 2023-06-19 MED ORDER — IRBESARTAN 150 MG PO TABS
150.0000 mg | ORAL_TABLET | Freq: Every day | ORAL | Status: DC
Start: 2023-06-19 — End: 2023-06-19
  Administered 2023-06-19: 150 mg via ORAL
  Filled 2023-06-19: qty 1

## 2023-06-19 NOTE — Telephone Encounter (Signed)
   Chief Complaint: Elevated BP- was seen at ED 06/17/23 Symptoms: headache, elevated BP Frequency: 06/11/23- going up and down- fluctuating  Pertinent Negatives: Patient denies  blurred vision, chest pain, difficulty breathing, weakness  Disposition: [] ED /[] Urgent Care (no appt availability in office) / [] Appointment(In office/virtual)/ []   Virtual Care/ [] Home Care/ [] Refused Recommended Disposition /[]  Mobile Bus/ [x]  Follow-up with PCP Additional Notes: Patient is calling requesting to see Dr Allena Katz - no open appointment- will send message to provider to see if patient can be seen. Recent adjustmnet to BP medications at ED - not seeming to help.  Copied from CRM [id number]. Topic: Clinical - Red Word Triage >> Jun 19, 2023 10:48 AM Franchot Heidelberg wrote: Red Word that prompted transfer to Nurse Triage: High blood pressure 178/80 Reason for Disposition  Systolic BP  >= 160 OR Diastolic >= 100  Answer Assessment - Initial Assessment Questions 1. BLOOD PRESSURE: "What is the blood pressure?" "Did you take at least two measurements 5 minutes apart?"     151/73- early am, 175/80- 10:45 2. ONSET: "When did you take your blood pressure?"     today 3. HOW: "How did you take your blood pressure?" (e.g., automatic home BP monitor, visiting nurse)     Automatic cuff- arm 4. HISTORY: "Do you have a history of high blood pressure?"     yes 5. MEDICINES: "Are you taking any medicines for blood pressure?" "Have you missed any doses recently?"     Yes- recent change in Rx- stopped amlodipine, doubled benicar 6. OTHER SYMPTOMS: "Do you have any symptoms?" (e.g., blurred vision, chest pain, difficulty breathing, headache, weakness)     headaches  Protocols used: Blood Pressure - High-A-AH

## 2023-06-19 NOTE — ED Provider Notes (Signed)
 [place] [facility]ERGENCY DEPARTMENT AT [facility] Provider Note   CSN: [id number] Arrival date & time: 06/19/23  1219     History  Chief Complaint  Patient presents with   Hypertension    [name] is a 78 y.o. male.  Patient states his blood pressure has been elevated.  He had a recent change in blood pressure medicine  The history is provided by the patient and medical records. No language interpreter was used.  Hypertension This is a new problem. The current episode started more than 2 days ago. The problem occurs constantly. The problem has not changed since onset.Pertinent negatives include no chest pain, no abdominal pain and no headaches. Nothing aggravates the symptoms. Nothing relieves the symptoms.       Home Medications Prior to Admission medications   Medication Sig Start Date End Date Taking? Authorizing Provider  acetaminophen (TYLENOL) 500 MG tablet Take 250 mg by mouth 2 (two) times daily as needed for moderate pain or headache.    [provider]  ALPRAZolam Prudy Feeler) 0.5 MG tablet Take 1 tablet (0.5 mg total) by mouth 3 (three) times daily as needed for anxiety. 10/26/22   Billie Lade, MD  apixaban (ELIQUIS) 5 MG TABS tablet Take 1 tablet (5 mg total) by mouth 2 (two) times daily. 04/29/23   [name], MD  atorvastatin (LIPITOR) 40 MG tablet Take 2 tablets (80 mg total) by mouth daily. 06/10/23 09/08/23  [name], MD  clopidogrel (PLAVIX) 75 MG tablet Take 1 tablet (75 mg total) by mouth daily. 07/04/22   [name], MD  diltiazem (CARDIZEM CD) 120 MG 24 hr capsule Take 1 capsule (120 mg total) by mouth daily. 07/04/22   [name], MD  isosorbide mononitrate (IMDUR) 30 MG 24 hr tablet Take 1 tablet (30 mg total) by mouth daily. 07/04/22   [name], MD  metoprolol tartrate (LOPRESSOR) 25 MG tablet Take 1 tablet (25 mg total) by mouth 2 (two) times daily. 07/04/22   [name], MD  olmesartan (BENICAR)  20 MG tablet Take 1 tablet (20 mg total) by mouth daily. 06/17/23   Lonell Grandchild, MD  pantoprazole (PROTONIX) 40 MG tablet TAKE ONE TABLET BY MOUTH ONCE DAILY. 06/03/23   Carlan, Chelsea L, NP  predniSONE (DELTASONE) 5 MG tablet Take 1 tablet (5 mg total) by mouth daily as needed. 06/11/23   Billie Lade, MD      Allergies    Lisinopril, Chlorthalidone, Flexeril [cyclobenzaprine], Hyoscyamine, Prevacid [lansoprazole], Valium [diazepam], and Potassium-containing compounds    Review of Systems   Review of Systems  Constitutional:  Negative for appetite change and fatigue.  HENT:  Negative for congestion, ear discharge and sinus pressure.   Eyes:  Negative for discharge.  Respiratory:  Negative for cough.   Cardiovascular:  Negative for chest pain.  Gastrointestinal:  Negative for abdominal pain and diarrhea.  Genitourinary:  Negative for frequency and hematuria.  Musculoskeletal:  Negative for back pain.  Skin:  Negative for rash.  Neurological:  Negative for seizures and headaches.  Psychiatric/Behavioral:  Negative for hallucinations.     Physical Exam Updated Vital Signs BP (!) 182/81   Pulse (!) 55   Temp 97.7 F (36.5 C) (Oral)   Resp 16   Ht 5\' 8"  (1.727 m)   Wt 66.7 kg   SpO2 98%   BMI 22.35 kg/m  Physical Exam Vitals and nursing note reviewed.  Constitutional:  Appearance: He is well-developed.  HENT:     Head: Normocephalic.     Nose: Nose normal.  Eyes:     General: No scleral icterus.    Conjunctiva/sclera: Conjunctivae normal.  Neck:     Thyroid: No thyromegaly.  Cardiovascular:     Rate and Rhythm: Normal rate and regular rhythm.     Heart sounds: No murmur heard.    No friction rub. No gallop.  Pulmonary:     Breath sounds: No stridor. No wheezing or rales.  Chest:     Chest wall: No tenderness.  Abdominal:     General: There is no distension.     Tenderness: There is no abdominal tenderness. There is no rebound.  Musculoskeletal:         General: Normal range of motion.     Cervical back: Neck supple.  Lymphadenopathy:     Cervical: No cervical adenopathy.  Skin:    Findings: No erythema or rash.  Neurological:     Mental Status: He is alert and oriented to person, place, and time.     Motor: No abnormal muscle tone.     Coordination: Coordination normal.  Psychiatric:        Behavior: Behavior normal.     ED Results / Procedures / Treatments   Labs (all labs ordered are listed, but only abnormal results are displayed) Labs Reviewed  BASIC METABOLIC PANEL WITH GFR - Abnormal; Notable for the following components:      Result Value   Glucose, Bld 114 (*)    All other components within normal limits  CBC  TROPONIN I (HIGH SENSITIVITY)  TROPONIN I (HIGH SENSITIVITY)    EKG None  Radiology DG Chest Port 1 View Result Date: 06/19/2023 CLINICAL DATA:  Chest pain. EXAM: PORTABLE CHEST 1 VIEW COMPARISON:  06/17/2023. FINDINGS: Bilateral lung fields are clear. Bilateral costophrenic angles are clear. Normal cardio-mediastinal silhouette. No acute osseous abnormalities. The soft tissues are within normal limits. IMPRESSION: No active disease. Electronically Signed   By: Jules Schick M.D.   On: 06/19/2023 15:00   DG Chest Portable 1 View Result Date: 06/17/2023 CLINICAL DATA:  Chest pain, headache. EXAM: PORTABLE CHEST 1 VIEW COMPARISON:  03/29/2022 and CT chest 09/25/2018. FINDINGS: Trachea is midline. Heart size within normal limits. Right coronary artery calcification. Thoracic aorta is calcified. Biapical pleural thickening. Lungs are clear. No pleural fluid. IMPRESSION: No acute findings. Electronically Signed   By: Leanna Battles M.D.   On: 06/17/2023 18:18   CT Head Wo Contrast Result Date: 06/17/2023 CLINICAL DATA:  Head trauma EXAM: CT HEAD WITHOUT CONTRAST TECHNIQUE: Contiguous axial images were obtained from the base of the skull through the vertex without intravenous contrast. RADIATION DOSE REDUCTION:  This exam was performed according to the departmental dose-optimization program which includes automated exposure control, adjustment of the mA and/or kV according to patient size and/or use of iterative reconstruction technique. COMPARISON:  Head CT 01/29/2023 FINDINGS: Brain: No evidence of acute infarction, hemorrhage, hydrocephalus, extra-axial collection or mass lesion/mass effect. Vascular: Atherosclerotic calcifications are present within the cavernous internal carotid arteries. Skull: Normal. Negative for fracture or focal lesion. Sinuses/Orbits: No acute finding. Other: None. IMPRESSION: No acute intracranial abnormality. Electronically Signed   By: Darliss Cheney M.D.   On: 06/17/2023 18:01    Procedures Procedures    Medications Ordered in ED Medications  irbesartan (AVAPRO) tablet 150 mg (150 mg Oral Given 06/19/23 1459)    ED Course/ Medical Decision Making/ A&P  Medical Decision Making Risk Prescription drug management.   Poorly controlled hypertension.  Patient will increase his Benicar        Final Clinical Impression(s) / ED Diagnoses Final diagnoses:  Primary hypertension    Rx / DC Orders ED Discharge Orders     None         Bethann Berkshire, MD 06/23/23 1112

## 2023-06-19 NOTE — Discharge Instructions (Signed)
 Increase your Benicar so you are taking 40 mg a day.  You have 20 mg tablets so you can take 2 of those once in the morning.  record your blood pressure once a day at the same time and bring those blood pressure recordings to your family doctor next week so he can see what your blood pressure is doing

## 2023-06-19 NOTE — Telephone Encounter (Signed)
  Chief Complaint: patient wife requesting appt for f/u for elevated BP and medication management. Symptoms: patient in ED now due to elevated BP 208/? Headache chest pain Frequency: today  Pertinent Negatives: Patient denies na Disposition: [x] ED /[] Urgent Care (no appt availability in office) / [] Appointment(In office/virtual)/ []  [place] [facility]/ [] Home Care/ [] Refused Recommended Disposition /[] [place] [facility]/ []  Follow-up with PCP Additional Notes:    Patient wife requesting appt with Dr. Allena Katz to establish care with different provider since PCP will be leaving soon. Scheduled appt with PCP  06/21/23 to review management of medications and reported to patient wife another provider will be assigned after PCP gone. Please advise.       Copied from CRM [id number]. Topic: Clinical - Red Word Triage >> Jun 19, 2023  3:24 PM Antwanette L wrote: Red Word that prompted transfer to Nurse Triage: Patient blood pressure is reading 208 and heart rate is 48 Reason for Disposition  [1] Systolic BP  >= 160 OR Diastolic >= 100 AND [2] cardiac (e.g., breathing difficulty, chest pain) or neurologic symptoms (e.g., new-onset blurred or double vision, unsteady gait)  Answer Assessment - Initial Assessment Questions 1. BLOOD PRESSURE: "What is the blood pressure?" "Did you take at least two measurements 5 minutes apart?"     Per patient wife patient is in ED now due to elevated BP and low HR 2. ONSET: "When did you take your blood pressure?"     Na  3. HOW: "How did you take your blood pressure?" (e.g., automatic home BP monitor, visiting nurse)     Na  4. HISTORY: "Do you have a history of high blood pressure?"     Yes 5. MEDICINES: "Are you taking any medicines for blood pressure?" "Have you missed any doses recently?"     na 6. OTHER SYMPTOMS: "Do you have any symptoms?" (e.g., blurred vision, chest pain, difficulty breathing, headache, weakness)   Chest pain , elevated BP , heart rate  48 . Headache  7. PREGNANCY: "Is there any chance you are pregnant?" "When was your last menstrual period?"     na  Protocols used: Blood Pressure - High-A-AH

## 2023-06-19 NOTE — Telephone Encounter (Signed)
 Spouse called to advise that pt was at ED and she would a cb from Dr Hazle Coca nurse

## 2023-06-19 NOTE — Telephone Encounter (Signed)
 Pt currently at ER for Hypertension

## 2023-06-19 NOTE — Telephone Encounter (Signed)
 Patient identification verified by 2 forms. Miguel Flood, RN     Called and spoke to patient's wife [name]  [name] states:  - Patient being seen at ED again today because blood pressure was 208/ SYS at home with HR of 46.  - Patient also experiencing increased urinary output since start BENICAR.  - Patient would like to go back to amlodipine.  - Patient BP at time of call was 177/69. He was given IRBESARTAN 150 mg in ER but had not taken medication before this BP reading.  - Patient to increase BENICAR to 40mg  daily per ER AVS.  - Patient scheduled to see PCP on fridya 4/4 regarding BP and medication changes.  [name] unsure if patient will keep the appointment as he did not have a good experience at the last visit.   Patient denies:  - chest pain, SOB, blurred vision, dizziness, slurred speech, one-sided weakness at time of call.              Interventions/Plan: - Patient already scheduled with Dr. Allyson Sabal on 4/29. NO APP appts available through June.  - Encouraged patient to keep appointment with PCP on 4/4 and follow up with [name] Friday/Monday to update [name] on any further changes by PCP.  - Patient will begin 40 mg of BENICAR tomorrow and continue monitoring BP.    Reviewed ED warning signs/precautions  [name]/Patient agrees with plan, no questions at this time

## 2023-06-19 NOTE — ED Triage Notes (Signed)
 Pt arrived via [facility] from home c/o hypertension. Pt seen here recently for same complaint. Pt reports trying to see his PCP but was unable to make an appointment. Pt endorses mild headache and chest discomfort.

## 2023-06-20 ENCOUNTER — Other Ambulatory Visit: Payer: Self-pay | Admitting: Cardiovascular Disease

## 2023-06-21 ENCOUNTER — Ambulatory Visit: Payer: Self-pay | Admitting: Internal Medicine

## 2023-06-21 DIAGNOSIS — I10 Essential (primary) hypertension: Secondary | ICD-10-CM | POA: Diagnosis not present

## 2023-06-21 DIAGNOSIS — I480 Paroxysmal atrial fibrillation: Secondary | ICD-10-CM | POA: Diagnosis not present

## 2023-06-21 DIAGNOSIS — Z299 Encounter for prophylactic measures, unspecified: Secondary | ICD-10-CM | POA: Diagnosis not present

## 2023-06-21 DIAGNOSIS — D692 Other nonthrombocytopenic purpura: Secondary | ICD-10-CM | POA: Diagnosis not present

## 2023-06-21 DIAGNOSIS — I25118 Atherosclerotic heart disease of native coronary artery with other forms of angina pectoris: Secondary | ICD-10-CM | POA: Diagnosis not present

## 2023-06-21 DIAGNOSIS — I779 Disorder of arteries and arterioles, unspecified: Secondary | ICD-10-CM | POA: Diagnosis not present

## 2023-06-27 ENCOUNTER — Other Ambulatory Visit: Payer: Self-pay | Admitting: Cardiovascular Disease

## 2023-06-28 ENCOUNTER — Other Ambulatory Visit: Payer: Self-pay | Admitting: Internal Medicine

## 2023-06-30 ENCOUNTER — Other Ambulatory Visit: Payer: Self-pay | Admitting: Cardiovascular Disease

## 2023-07-02 NOTE — Telephone Encounter (Signed)
 Dr. Arlester Ladd Pt. Please address.

## 2023-07-08 ENCOUNTER — Ambulatory Visit: Payer: Medicare Other | Admitting: Cardiovascular Disease

## 2023-07-09 ENCOUNTER — Other Ambulatory Visit: Payer: Self-pay | Admitting: Cardiovascular Disease

## 2023-07-10 DIAGNOSIS — Z299 Encounter for prophylactic measures, unspecified: Secondary | ICD-10-CM | POA: Diagnosis not present

## 2023-07-10 DIAGNOSIS — I700 Atherosclerosis of aorta: Secondary | ICD-10-CM | POA: Diagnosis not present

## 2023-07-10 DIAGNOSIS — Z0000 Encounter for general adult medical examination without abnormal findings: Secondary | ICD-10-CM | POA: Diagnosis not present

## 2023-07-10 DIAGNOSIS — I25118 Atherosclerotic heart disease of native coronary artery with other forms of angina pectoris: Secondary | ICD-10-CM | POA: Diagnosis not present

## 2023-07-10 DIAGNOSIS — Z1331 Encounter for screening for depression: Secondary | ICD-10-CM | POA: Diagnosis not present

## 2023-07-10 DIAGNOSIS — Z7189 Other specified counseling: Secondary | ICD-10-CM | POA: Diagnosis not present

## 2023-07-10 DIAGNOSIS — I779 Disorder of arteries and arterioles, unspecified: Secondary | ICD-10-CM | POA: Diagnosis not present

## 2023-07-10 DIAGNOSIS — Z1339 Encounter for screening examination for other mental health and behavioral disorders: Secondary | ICD-10-CM | POA: Diagnosis not present

## 2023-07-10 DIAGNOSIS — I10 Essential (primary) hypertension: Secondary | ICD-10-CM | POA: Diagnosis not present

## 2023-07-11 NOTE — Telephone Encounter (Signed)
 Pharmacy says that insurance has an issue with this. Please advise.

## 2023-07-12 ENCOUNTER — Other Ambulatory Visit: Payer: Self-pay | Admitting: Cardiovascular Disease

## 2023-07-16 ENCOUNTER — Encounter: Payer: Self-pay | Admitting: Cardiovascular Disease

## 2023-07-16 ENCOUNTER — Ambulatory Visit: Attending: Cardiovascular Disease | Admitting: Cardiovascular Disease

## 2023-07-16 VITALS — BP 150/72 | HR 59 | Ht 68.0 in | Wt 144.4 lb

## 2023-07-16 DIAGNOSIS — E785 Hyperlipidemia, unspecified: Secondary | ICD-10-CM

## 2023-07-16 DIAGNOSIS — I480 Paroxysmal atrial fibrillation: Secondary | ICD-10-CM

## 2023-07-16 DIAGNOSIS — I2510 Atherosclerotic heart disease of native coronary artery without angina pectoris: Secondary | ICD-10-CM | POA: Diagnosis not present

## 2023-07-16 DIAGNOSIS — I10 Essential (primary) hypertension: Secondary | ICD-10-CM | POA: Diagnosis not present

## 2023-07-16 MED ORDER — ASPIRIN 81 MG PO TBEC: 81.0000 mg | DELAYED_RELEASE_TABLET | Freq: Every day | ORAL | Status: AC

## 2023-07-16 MED ORDER — APIXABAN 5 MG PO TABS: 5.0000 mg | ORAL_TABLET | Freq: Two times a day (BID) | ORAL | 0 refills | Status: DC

## 2023-07-16 MED ORDER — APIXABAN 5 MG PO TABS: 5.0000 mg | ORAL_TABLET | Freq: Two times a day (BID) | ORAL | 3 refills | Status: DC

## 2023-07-16 MED ORDER — ISOSORBIDE MONONITRATE ER 30 MG PO TB24: 30.0000 mg | ORAL_TABLET | Freq: Every day | ORAL | 3 refills | Status: DC

## 2023-07-16 MED ORDER — AMLODIPINE BESYLATE 10 MG PO TABS: 10.0000 mg | ORAL_TABLET | Freq: Every day | ORAL | 3 refills | Status: DC

## 2023-07-16 MED ORDER — SPIRONOLACTONE 25 MG PO TABS: 12.5000 mg | ORAL_TABLET | Freq: Every day | ORAL | 3 refills | Status: DC

## 2023-07-16 NOTE — Assessment & Plan Note (Signed)
 History of essential hypertension blood pressure measured today at 150/72.  He was on Benicar  which he has stopped taking.  He is on amlodipine , diltiazem , metoprolol .  His blood pressure has been elevated recently in the ER.  I am going to stop the diltiazem , increase his amlodipine  from 5 to 10 mg a day.  I am going to add hydrochlorothiazide 12.5 mg and we will check a basic metabolic panel in 2 weeks.  Going to have me keep a 30 blood pressure log and see a Pharm.D. in 2 months to review and titrate his meds.  He may be a candidate for another ARB such as losartan.

## 2023-07-16 NOTE — Assessment & Plan Note (Signed)
 History of CAD status post cath by myself 02/24/2018 revealing a high-grade mid RCA stenosis.  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  He has had no recurrent symptoms.  Myoview  stress test performed 03/30/2022 was low risk and nonischemic.

## 2023-07-16 NOTE — Patient Instructions (Signed)
 Medication Instructions:   -Stop taking diltiazem  (Cardizem  CD).  -Stop taking clopidogrel  (plavix ).  -Start taking Aspirin  81mg  EC once daily.  -Start spironolactone (aldactone) 12.5mg  (half a tablet) once daily.  -Increase amlodipine  (norvasc ) to 10mg  once daily.  *If you need a refill on your cardiac medications before your next appointment, please call your pharmacy*  Lab Work: Your physician recommends that you return for lab work in: 2 weeks for BMET  If you have labs (blood work) drawn today and your tests are completely normal, you will receive your results only by: MyChart Message (if you have MyChart) OR A paper copy in the mail If you have any lab test that is abnormal or we need to change your treatment, we will call you to review the results.   Follow-Up: At [facility], you and your health needs are our priority.  As part of our continuing mission to provide you with exceptional heart care, our providers are all part of one team.  This team includes your primary Cardiologist (physician) and Advanced Practice Providers or APPs (Physician Assistants and Nurse Practitioners) who all work together to provide you with the care you need, when you need it.  Your next appointment:   6 month(s)  Provider:   Marcie Sever, PA-C, Palmer Bobo, NP, Hao Meng, PA-C, Marlana Silvan, NP, or Katlyn West, NP         Then, Lauro Portal, MD will plan to see you again in 12 month(s).     We recommend signing up for the patient portal called "MyChart".  Sign up information is provided on this After Visit Summary.  MyChart is used to connect with patients for Virtual Visits (Telemedicine).  Patients are able to view lab/test results, encounter notes, upcoming appointments, etc.  Non-urgent messages can be sent to your provider as well.   To learn more about what you can do with MyChart, go to [email or web address].   Other Instructions Dr. Katheryne Pane has requested that you  schedule an appointment with one of our clinical pharmacists for a blood pressure check appointment within the next 8 weeks.  If you monitor your blood pressure (BP) at home, please bring your BP cuff and your BP readings with you to this appointment  HOW TO TAKE YOUR BLOOD PRESSURE: Rest 5 minutes before taking your blood pressure. Don't smoke or drink caffeinated beverages for at least 30 minutes before. Take your blood pressure before (not after) you eat. Sit comfortably with your back supported and both feet on the floor (don't cross your legs). Elevate your arm to heart level on a table or a desk. Use the proper sized cuff. It should fit smoothly and snugly around your bare upper arm. There should be enough room to slip a fingertip under the cuff. The bottom edge of the cuff should be 1 inch above the crease of the elbow. Ideally, take 3 measurements at one sitting and record the average.

## 2023-07-16 NOTE — Assessment & Plan Note (Signed)
 History of dyslipidemia on high-dose statin therapy with lipid profile performed 10/26/2022 revealing total cholesterol 145, LDL 77 and HDL 52.

## 2023-07-16 NOTE — Assessment & Plan Note (Signed)
 History of PAF maintaining sinus rhythm on Eliquis  oral anticoagulation.  I am going to stop his clopidogrel  and add a baby aspirin .

## 2023-07-16 NOTE — Progress Notes (Signed)
 07/16/2023 [name]   11/12/1945  [id number]  Primary Physician Orlena Bitters, MD Primary Cardiologist: Avanell Leigh MD FACP, [place], [place], [place]  HPI:  [name] is a 78 y.o. thin appearing married Caucasian male who I last  saw him in the office 04/29/2023.  He is accompanied by his wife [name] today.  He has a history of hypertension and hyperlipidemia. He had atypical chest pain here tone with a negative Myoview  07/09/12. His pain improved with proton pump inhibition. Because of recurrent chest pain he saw Cricket Doll, PA-C in the office who did obtain a 2-D echo and Myoview  perfusion study all normal. His pain has improved again on a PPI suggesting that it was related to GERD. Since I saw [name] in the office a year ago he has been well until this past Christmas when he was admitted to [facility] with A. fib with RVR. He converted to normal sinus rhythm with IV diltiazem  and was converted to by mouth Cardizem . His troponins were negative and 2-D echo was normal. Because of his elevated CHA2DsVASC2 score of 2 he was begun on Eliquis  oral anticoagulation. He saw Tera Fellows in the A. fib clinic 04/19/16 because of palpitations. He says he occasionally feels his heart skip or missed AB. She also had a more prolonged episode of PAF lasting 20 seconds. She gave him when necessary diltiazem  30 mg a day for persistently elevated heart rate. He was just seen in the emergency room 07/07/16 by Dr. Ferdinand Houseman. He was complaining of palpitations and was noted to have PVCs.   He was admitted to the hospital and had cardiac cath by myself 02/24/2018 revealing a high-grade mid RCA stenosis.  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  His EF was preserved without inferior wall motion abnormality.  He was on "triple therapy" for 1 month after which aspirin  was discontinued.  He remains on Plavix  and Eliquis .     He  did see Friddie Jetty, NP in the office 12/11/2018 who added a PPI and gave him some Xanax .  He was seen in the ER 05/16/2019 with epigastric pain.  His EKG showed no acute changes.  His troponins were negative x2.  He does get occasional episodes of epigastric pain on Protonix .     Since I saw him in the office 2 months ago he was seen in the ER for hypertension.  He has been experiencing headaches.  His primary care provider stopped his ARB.  Otherwise he stable and denies chest pain or shortness of breath.   Current Meds  Medication Sig   acetaminophen  (TYLENOL ) 500 MG tablet Take 250 mg by mouth 2 (two) times daily as needed for moderate pain or headache.   ALPRAZolam  (XANAX ) 0.5 MG tablet Take 1 tablet (0.5 mg total) by mouth 3 (three) times daily as needed for anxiety.   amLODipine  (NORVASC ) 5 MG tablet Take 5 mg by mouth daily.   apixaban  (ELIQUIS ) 5 MG TABS tablet Take 1 tablet (5 mg total) by mouth 2 (two) times daily.   atorvastatin  (LIPITOR ) 80 MG tablet Take 1 tablet (80 mg total) by mouth daily.   clopidogrel  (PLAVIX ) 75 MG tablet Take 1 tablet (75 mg total) by mouth daily.   isosorbide  mononitrate (IMDUR ) 30 MG 24 hr tablet TAKE ONE TABLET BY MOUTH ONCE DAILY.   metoprolol  tartrate (LOPRESSOR ) 25 MG tablet TAKE (1) TABLET BY  MOUTH TWICE DAILY.   pantoprazole  (PROTONIX ) 40 MG tablet TAKE ONE TABLET BY MOUTH ONCE DAILY.   predniSONE  (DELTASONE ) 5 MG tablet Take 1 tablet (5 mg total) by mouth daily as needed.   [DISCONTINUED] diltiazem  (CARDIZEM  CD) 120 MG 24 hr capsule TAKE ONE CAPSULE BY MOUTH ONCE DAILY.     Allergies  Allergen Reactions   Lisinopril Swelling    H/o of angioedema    Chlorthalidone  Other (See Comments)    Causes severe hyponatremia   Flexeril [Cyclobenzaprine]     Didn't feel right   Hyoscyamine  Swelling   Prevacid [Lansoprazole] Swelling   Valium [Diazepam] Other (See Comments)    "makes me feel crazy"   Potassium-Containing Compounds Palpitations     Social History   Socioeconomic History   Marital status: Married    Spouse name: Not on file   Number of children: 1   Years of education: Not on file   Highest education level: Not on file  Occupational History   Not on file  Tobacco Use   Smoking status: Never    Passive exposure: Past   Smokeless tobacco: Never  Vaping Use   Vaping status: Never Used  Substance and Sexual Activity   Alcohol use: No   Drug use: No   Sexual activity: Not Currently  Other Topics Concern   Not on file  Social History Narrative   ** Merged History Encounter **       Social Drivers of Health   Financial Resource Strain: Low Risk  (02/25/2018)   Overall Financial Resource Strain (CARDIA)    Difficulty of Paying Living Expenses: Not hard at all  Food Insecurity: No Food Insecurity (03/29/2022)   Hunger Vital Sign    Worried About Running Out of Food in the Last Year: Never true    Ran Out of Food in the Last Year: Never true  Transportation Needs: No Transportation Needs (03/29/2022)   PRAPARE - Administrator, Civil Service (Medical): No    Lack of Transportation (Non-Medical): No  Physical Activity: Insufficiently Active (02/25/2018)   Exercise Vital Sign    Days of Exercise per Week: 3 days    Minutes of Exercise per Session: 10 min  Stress: Stress Concern Present (02/25/2018)   [employer] of Occupational Health - Occupational Stress Questionnaire    Feeling of Stress : Rather much  Social Connections: Not on file  Intimate Partner Violence: Not At Risk (03/29/2022)   Humiliation, Afraid, Rape, and Kick questionnaire    Fear of Current or Ex-Partner: No    Emotionally Abused: No    Physically Abused: No    Sexually Abused: No     Review of Systems: General: negative for chills, fever, night sweats or weight changes.  Cardiovascular: negative for chest pain, dyspnea on exertion, edema, orthopnea, palpitations, paroxysmal nocturnal dyspnea or shortness of  breath Dermatological: negative for rash Respiratory: negative for cough or wheezing Urologic: negative for hematuria Abdominal: negative for nausea, vomiting, diarrhea, bright red blood per rectum, melena, or hematemesis Neurologic: negative for visual changes, syncope, or dizziness All other systems reviewed and are otherwise negative except as noted above.    Blood pressure (!) 150/72, pulse (!) 59, height 5\' 8"  (1.727 m), weight 144 lb 6.4 oz (65.5 kg), SpO2 97%.  General appearance: alert and no distress Neck: no adenopathy, no carotid bruit, no JVD, supple, symmetrical, trachea midline, and thyroid  not enlarged, symmetric, no tenderness/mass/nodules Lungs: clear to auscultation bilaterally Heart: regular rate  and rhythm, S1, S2 normal, no murmur, click, rub or gallop Extremities: extremities normal, atraumatic, no cyanosis or edema Pulses: 2+ and symmetric Skin: Skin color, texture, turgor normal. No rashes or lesions Neurologic: Grossly normal  EKG not performed today      ASSESSMENT AND PLAN:   Dyslipidemia History of dyslipidemia on high-dose statin therapy with lipid profile performed 10/26/2022 revealing total cholesterol 145, LDL 77 and HDL 52.  Essential hypertension History of essential hypertension blood pressure measured today at 150/72.  He was on Benicar  which he has stopped taking.  He is on amlodipine , diltiazem , metoprolol .  His blood pressure has been elevated recently in the ER.  I am going to stop the diltiazem , increase his amlodipine  from 5 to 10 mg a day.  I am going to add hydrochlorothiazide 12.5 mg and we will check a basic metabolic panel in 2 weeks.  Going to have me keep a 30 blood pressure log and see a Pharm.D. in 2 months to review and titrate his meds.  He may be a candidate for another ARB such as losartan.  PAF (paroxysmal atrial fibrillation) (HCC) History of PAF maintaining sinus rhythm on Eliquis  oral anticoagulation.  I am going to stop his  clopidogrel  and add a baby aspirin .  CAD (coronary artery disease) History of CAD status post cath by myself 02/24/2018 revealing a high-grade mid RCA stenosis.  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  He has had no recurrent symptoms.  Myoview  stress test performed 03/30/2022 was low risk and nonischemic.     Avanell Leigh MD FACP,FACC,FAHA, [facility] 07/16/2023 10:14 AM

## 2023-07-17 DIAGNOSIS — Z299 Encounter for prophylactic measures, unspecified: Secondary | ICD-10-CM | POA: Diagnosis not present

## 2023-07-17 DIAGNOSIS — E261 Secondary hyperaldosteronism: Secondary | ICD-10-CM | POA: Diagnosis not present

## 2023-07-17 DIAGNOSIS — I10 Essential (primary) hypertension: Secondary | ICD-10-CM | POA: Diagnosis not present

## 2023-07-18 ENCOUNTER — Telehealth: Payer: Self-pay | Admitting: Cardiovascular Disease

## 2023-07-18 ENCOUNTER — Other Ambulatory Visit: Payer: Self-pay | Admitting: Cardiovascular Disease

## 2023-07-18 MED ORDER — HYDRALAZINE HCL 25 MG PO TABS: 25.0000 mg | ORAL_TABLET | Freq: Three times a day (TID) | ORAL | 1 refills | Status: DC

## 2023-07-18 NOTE — Addendum Note (Signed)
 Addended by: Arva Lathe on: 07/18/2023 12:14 PM   Modules accepted: Orders

## 2023-07-18 NOTE — Telephone Encounter (Signed)
.  Pt c/o medication issue:  1. Name of Medication: Spironolactone   2. How are you currently taking this medication (dosage and times per day)?   3. Are you having a reaction (difficulty breathing--STAT)?   4. What is your medication issue? Started on this medicine yesterday and had irregular heart beat, beating really rapid

## 2023-07-18 NOTE — Telephone Encounter (Signed)
 Patient identification verified by 2 forms. [name], RN    Called and spoke to patient and his wife [name]  Patient and [name] states:   -patient started spironolactone  yesterday   -last night patient developed irregular hear rate   -he fell asleep at 9pm, woke up at 4am with heart racing   -did not check to know what his heart rate was, but it felt very fast   -hear rate did not feel irregular/was not skipping   -in past patient was on Rx that removed fluid and it also caused irregular heart rate   -this morning heart rate was 70   -typically his heart rate is 50 Patient denies:   -Chest pain   -SOB  Informed patient:  -unsure if symptoms related to spironolactone , not a listed side effect  -message sent to Dr. Katheryne Pane for input  Patient verbalized understanding, no questions at this time

## 2023-07-18 NOTE — Telephone Encounter (Addendum)
 Spoke with pt and advised per Dr Katheryne Pane stop Spironlactone and start Hydralazine  25mg  - 1 tablet by mouth three times daily.  Pt verbalizes understanding and agrees with current plan.

## 2023-07-19 ENCOUNTER — Telehealth: Payer: Self-pay

## 2023-07-19 NOTE — Telephone Encounter (Signed)
 Copied from CRM [id number]. Topic: Clinical - Prescription Issue >> Jul 19, 2023  8:06 AM Zipporah Him wrote: Reason for CRM: Patient is waiting on a refill of predniSONE  (DELTASONE ) 5 MG tablet. States that he was seeing dr Kermit Ped, but dr patel took him on. They are wondering if that caused a delay. They are needing this over to the [facility], they state the pharmacy has already faxed over for a refill but not gotten a response. Please call with any issues.

## 2023-07-31 LAB — BASIC METABOLIC PANEL WITH GFR
BUN/Creatinine Ratio: 15 (ref 10–24)
BUN: 13 mg/dL (ref 8–27)
CO2: 22 mmol/L (ref 20–29)
Calcium: 9.2 mg/dL (ref 8.6–10.2)
Chloride: 101 mmol/L (ref 96–106)
Creatinine, Ser: 0.84 mg/dL (ref 0.76–1.27)
Glucose: 137 mg/dL — ABNORMAL HIGH (ref 70–99)
Potassium: 4.4 mmol/L (ref 3.5–5.2)
Sodium: 136 mmol/L (ref 134–144)
eGFR: 90 mL/min/{1.73_m2} (ref >59)

## 2023-08-01 ENCOUNTER — Ambulatory Visit: Payer: Self-pay | Admitting: Cardiovascular Disease

## 2023-08-02 NOTE — Telephone Encounter (Signed)
 Erroneous encounter

## 2023-08-14 ENCOUNTER — Encounter: Payer: Self-pay | Admitting: Family Medicine

## 2023-08-14 ENCOUNTER — Ambulatory Visit (INDEPENDENT_AMBULATORY_CARE_PROVIDER_SITE_OTHER): Payer: Self-pay | Admitting: Family Medicine

## 2023-08-14 VITALS — BP 178/80 | HR 64 | Temp 98.4°F | Resp 16 | Ht 68.0 in | Wt 139.1 lb

## 2023-08-14 DIAGNOSIS — J069 Acute upper respiratory infection, unspecified: Secondary | ICD-10-CM | POA: Insufficient documentation

## 2023-08-14 MED ORDER — BENZONATATE 200 MG PO CAPS: 200.0000 mg | ORAL_CAPSULE | Freq: Two times a day (BID) | ORAL | 1 refills | Status: DC | PRN

## 2023-08-14 MED ORDER — AZITHROMYCIN 250 MG PO TABS: ORAL_TABLET | ORAL | 0 refills | Status: DC

## 2023-08-14 MED ORDER — LIDOCAINE VISCOUS HCL 2 % MT SOLN: 15.0000 mL | OROMUCOSAL | 0 refills | Status: AC | PRN

## 2023-08-14 NOTE — Patient Instructions (Signed)

## 2023-08-14 NOTE — Progress Notes (Signed)
 Established Patient Office Visit   Subjective  Patient ID: [name], male    DOB: 1945/05/31  Age: 78 y.o. MRN: [id number]  Chief Complaint  Patient presents with   Sore Throat    X  3 weeks. Sore throat, coughing, cold sweats at night. States he has blisters/sores in the back of his throat. Has tried mucinex and OTC cough syrup. Can feel mucus in his chest and when it comes up, its whitish in color. Covid test negative    He  has a past medical history of Anxiety, Atrial fibrillation (HCC), Cancer (HCC), Chest pain, GERD (gastroesophageal reflux disease), H. pylori infection, Headache, Hypercholesteremia, Hyperlipidemia, Hypertension, Hyponatremia (2013), Palpitations, and Personal history of colonic polyps (2 9 2007).  Patient complains of persistent cough. Patient describes symptoms of shortness of breath at rest, chills without rigors, cough, fatigue, headache, malaise, sore throat, sputum production, and wheezing. Symptoms began several weeks ago and are gradually worsening since that time. Patient denies chest pain or nausea and vomiting. Treatment thus far includes OTC analgesics/antipyretics: not very effective and anti-tussive: not very effective Past pulmonary history is significant for no history of pneumonia or bronchitis     Review of Systems  Constitutional:  Positive for chills and malaise/fatigue.  HENT:  Positive for congestion and sore throat.   Eyes:  Negative for blurred vision.  Respiratory:  Positive for cough, sputum production, shortness of breath and wheezing.   Cardiovascular:  Negative for chest pain.  Neurological:  Negative for dizziness.      Objective:     BP (!) 178/80   Pulse 64   Temp 98.4 F (36.9 C) (Oral)   Resp 16   Ht 5\' 8"  (1.727 m)   Wt 139 lb 1.9 oz (63.1 kg)   SpO2 97%   BMI 21.15 kg/m  BP Readings from Last 3 Encounters:  08/14/23 (!) 178/80  07/16/23 (!) 150/72  06/19/23 (!) 182/81      Physical Exam Vitals reviewed.   Constitutional:      General: He is not in acute distress.    Appearance: Normal appearance. He is not ill-appearing, toxic-appearing or diaphoretic.  HENT:     Head: Normocephalic.     Mouth/Throat:     Pharynx: Oropharyngeal exudate and posterior oropharyngeal erythema present.  Eyes:     General:        Right eye: No discharge.        Left eye: No discharge.     Conjunctiva/sclera: Conjunctivae normal.  Cardiovascular:     Rate and Rhythm: Normal rate.     Pulses: Normal pulses.     Heart sounds: Normal heart sounds.  Pulmonary:     Effort: Pulmonary effort is normal. No respiratory distress.     Breath sounds: Wheezing present.  Skin:    General: Skin is warm and dry.     Capillary Refill: Capillary refill takes less than 2 seconds.  Neurological:     Mental Status: He is alert.  Psychiatric:        Mood and Affect: Mood normal.        Behavior: Behavior normal.      No results found for any visits on 08/14/23.  The ASCVD Risk score (Arnett DK, et al., 2019) failed to calculate for the following reasons:   Risk score cannot be calculated because patient has a medical history suggesting prior/existing ASCVD    Assessment & Plan:  Upper respiratory tract infection, unspecified type Assessment &  Plan: Azithromycin 250 mg twice daily x 5 days,   Benzonatate 200 mg PRN Lidocaine  viscous solution PRN. Advise patient to rest to support your body's recovery. Stay hydrated by drinking water , tea, or broth. Using a humidifier can help soothe throat irritation and ease nasal congestion. For fever or pain, acetaminophen  (Tylenol ) is recommended. To relieve other symptoms, try saline nasal sprays, throat lozenges, or gargling with saltwater. Focus on eating light, healthy meals like fruits and vegetables to keep your strength up. Practice good hygiene by washing your hands frequently and covering your mouth when coughing or sneezing.Follow-up for worsening or persistent symptoms.  Patient verbalizes understanding regarding plan of care and all questions answered     Orders: -     Azithromycin; Take 2 tablets on day 1, then 1 tablet daily on days 2 through 5  Dispense: 6 tablet; Refill: 0  Other orders -     Lidocaine  Viscous HCl; Use as directed 15 mLs in the mouth or throat as needed.  Dispense: 100 mL; Refill: 0 -     Benzonatate; Take 1 capsule (200 mg total) by mouth 2 (two) times daily as needed for cough.  Dispense: 20 capsule; Refill: 1    Return if symptoms worsen or fail to improve.   Avelino Lek Amber Bail, FNP

## 2023-08-14 NOTE — Assessment & Plan Note (Signed)
 Azithromycin 250 mg twice daily x 5 days,   Benzonatate 200 mg PRN Lidocaine  viscous solution PRN. Advise patient to rest to support your body's recovery. Stay hydrated by drinking water , tea, or broth. Using a humidifier can help soothe throat irritation and ease nasal congestion. For fever or pain, acetaminophen  (Tylenol ) is recommended. To relieve other symptoms, try saline nasal sprays, throat lozenges, or gargling with saltwater. Focus on eating light, healthy meals like fruits and vegetables to keep your strength up. Practice good hygiene by washing your hands frequently and covering your mouth when coughing or sneezing.Follow-up for worsening or persistent symptoms. Patient verbalizes understanding regarding plan of care and all questions answered

## 2023-08-15 ENCOUNTER — Emergency Department (HOSPITAL_COMMUNITY)

## 2023-08-15 ENCOUNTER — Encounter (HOSPITAL_COMMUNITY): Payer: Self-pay

## 2023-08-15 ENCOUNTER — Other Ambulatory Visit: Payer: Self-pay

## 2023-08-15 ENCOUNTER — Emergency Department (HOSPITAL_COMMUNITY)
Admission: EM | Admit: 2023-08-15 | Discharge: 2023-08-15 | Disposition: A | Discharge status: Home / Self Care | Attending: Emergency Medicine | Admitting: Emergency Medicine

## 2023-08-15 DIAGNOSIS — J069 Acute upper respiratory infection, unspecified: Secondary | ICD-10-CM | POA: Diagnosis not present

## 2023-08-15 DIAGNOSIS — R059 Cough, unspecified: Secondary | ICD-10-CM | POA: Diagnosis present

## 2023-08-15 DIAGNOSIS — Z7901 Long term (current) use of anticoagulants: Secondary | ICD-10-CM | POA: Insufficient documentation

## 2023-08-15 DIAGNOSIS — Z79899 Other long term (current) drug therapy: Secondary | ICD-10-CM | POA: Diagnosis not present

## 2023-08-15 DIAGNOSIS — Z7982 Long term (current) use of aspirin: Secondary | ICD-10-CM | POA: Insufficient documentation

## 2023-08-15 DIAGNOSIS — I10 Essential (primary) hypertension: Secondary | ICD-10-CM | POA: Diagnosis not present

## 2023-08-15 LAB — RESP PANEL BY RT-PCR (RSV, FLU A&B, COVID)  RVPGX2
Influenza A by PCR: NEGATIVE
Influenza B by PCR: NEGATIVE
Resp Syncytial Virus by PCR: NEGATIVE
SARS Coronavirus 2 by RT PCR: NEGATIVE

## 2023-08-15 LAB — GROUP A STREP BY PCR: Group A Strep by PCR: NOT DETECTED

## 2023-08-15 NOTE — ED Provider Notes (Signed)
 [place] EMERGENCY DEPARTMENT AT [facility] Provider Note   CSN: [id number] Arrival date & time: 08/15/23  1253     History  Chief Complaint  Patient presents with   Sore Throat    [name] is a 78 y.o. male.  78 year old male with a history of MI, atrial fibrillation on Eliquis , hypertension, and hyperlipidemia who presents to the emergency department with cough.  Patient reports that 3 to 4 weeks ago he was at a concert when he was knocked someone who was coughing.  Says that there was a church related event and most of the people in the stretcher come down with COVID.  3 days after the concert started experiencing a nonproductive cough and a tickle in his throat.  Did have some fevers then but none recently.  Went to his primary care yesterday who treated empirically with azithromycin and lidocaine  but reports that his cough has persisted so comes in today.       Home Medications Prior to Admission medications   Medication Sig Start Date End Date Taking? Authorizing Provider  acetaminophen  (TYLENOL ) 500 MG tablet Take 250 mg by mouth 2 (two) times daily as needed for moderate pain or headache.    [provider]  ALPRAZolam  (XANAX ) 0.5 MG tablet Take 1 tablet (0.5 mg total) by mouth 3 (three) times daily as needed for anxiety. 10/26/22   Tobi Fortes, MD  amLODipine  (NORVASC ) 10 MG tablet Take 1 tablet (10 mg total) by mouth daily. 07/16/23   Avanell Leigh, MD  apixaban  (ELIQUIS ) 5 MG TABS tablet Take 1 tablet (5 mg total) by mouth 2 (two) times daily. 07/16/23   Avanell Leigh, MD  aspirin  EC 81 MG tablet Take 1 tablet (81 mg total) by mouth daily. Swallow whole. 07/16/23   Avanell Leigh, MD  atorvastatin  (LIPITOR ) 80 MG tablet Take 1 tablet (80 mg total) by mouth daily. 07/11/23   Avanell Leigh, MD  azithromycin (ZITHROMAX) 250 MG tablet Take 2 tablets on day 1, then 1 tablet daily on days 2 through 5 08/14/23   Del Orbe Polanco, [place], FNP   benzonatate (TESSALON) 200 MG capsule Take 1 capsule (200 mg total) by mouth 2 (two) times daily as needed for cough. 08/14/23   Del Orbe Polanco, Iliana, FNP  hydrALAZINE  (APRESOLINE ) 25 MG tablet Take 1 tablet (25 mg total) by mouth 3 (three) times daily. 07/18/23   Avanell Leigh, MD  isosorbide  mononitrate (IMDUR ) 30 MG 24 hr tablet Take 1 tablet (30 mg total) by mouth daily. 07/16/23   Avanell Leigh, MD  lidocaine  (XYLOCAINE ) 2 % solution Use as directed 15 mLs in the mouth or throat as needed. 08/14/23   Del Abron Abt, FNP  metoprolol  tartrate (LOPRESSOR ) 25 MG tablet TAKE (1) TABLET BY MOUTH TWICE DAILY. 06/27/23   Avanell Leigh, MD  pantoprazole  (PROTONIX ) 40 MG tablet TAKE ONE TABLET BY MOUTH ONCE DAILY. 06/03/23   Carlan, Chelsea L, NP  predniSONE  (DELTASONE ) 5 MG tablet Take 1 tablet (5 mg total) by mouth daily as needed. Patient not taking: Reported on 08/14/2023 06/28/23   Tobi Fortes, MD      Allergies    Lisinopril, Chlorthalidone , Flexeril [cyclobenzaprine], Hyoscyamine , Prevacid [lansoprazole], Valium [diazepam], and Potassium-containing compounds    Review of Systems   Review of Systems  Physical Exam Updated Vital Signs BP (!) 140/70 (BP Location: Right Arm)   Pulse 60   Temp 98 F (  36.7 C) (Oral)   Resp 16   Ht 5\' 8"  (1.727 m)   Wt 63.1 kg   SpO2 98%   BMI 21.15 kg/m  Physical Exam Vitals and nursing note reviewed.  Constitutional:      General: He is not in acute distress.    Appearance: He is well-developed.  HENT:     Head: Normocephalic and atraumatic.     Right Ear: External ear normal.     Left Ear: External ear normal.     Nose: Nose normal.     Mouth/Throat:     Mouth: Mucous membranes are moist.     Pharynx: Posterior oropharyngeal erythema present. No oropharyngeal exudate.     Comments: Uvula midline Eyes:     Extraocular Movements: Extraocular movements intact.     Conjunctiva/sclera: Conjunctivae normal.     Pupils:  Pupils are equal, round, and reactive to light.  Cardiovascular:     Rate and Rhythm: Normal rate and regular rhythm.     Heart sounds: Normal heart sounds.  Pulmonary:     Effort: Pulmonary effort is normal. No respiratory distress.     Breath sounds: Normal breath sounds.  Musculoskeletal:     Cervical back: Normal range of motion and neck supple.     Right lower leg: No edema.     Left lower leg: No edema.  Skin:    General: Skin is warm and dry.  Neurological:     Mental Status: He is alert. Mental status is at baseline.  Psychiatric:        Mood and Affect: Mood normal.        Behavior: Behavior normal.     ED Results / Procedures / Treatments   Labs (all labs ordered are listed, but only abnormal results are displayed) Labs Reviewed  RESP PANEL BY RT-PCR (RSV, FLU A&B, COVID)  [id number]  GROUP A STREP BY PCR    EKG None  Radiology DG Chest 2 View Result Date: 08/15/2023 CLINICAL DATA:  Cough and sore throat. EXAM: CHEST - 2 VIEW COMPARISON:  06/19/2023. FINDINGS: The heart size and mediastinal contours are unchanged. No focal consolidation, pleural effusion, or pneumothorax. Remote healed right-sided rib fracture. No acute osseous abnormality. IMPRESSION: No acute cardiopulmonary findings. Electronically Signed   By: Mannie Seek M.D.   On: 08/15/2023 15:38    Procedures Procedures    Medications Ordered in ED Medications - No data to display  ED Course/ Medical Decision Making/ A&P                                 Medical Decision Making Amount and/or Complexity of Data Reviewed Radiology: ordered.   [name] is a 78 y.o. male with comorbidities that complicate the patient evaluation including history of MI, atrial fibrillation on Eliquis , hypertension, and hyperlipidemia who presents to the emergency department with cough.  Initial Ddx:  URI, sinusitis, pneumonia, malignancy, strep throat, viral pharyngitis  MDM:  Feel the patient likely has a  URI based on their symptoms.  They are overall well-appearing but with his age and duration of symptoms we will obtain a chest x-ray to rule out pneumonia or mass.  Throat erythematous but appears clear otherwise no signs of abscess or deeper infection.  Based on timeline and severity of symptoms feel that treatment for sinusitis is not warranted at this time.  Plan:  COVID/flu Chest x-ray  ED Summary/Re-evaluation:  Patient reevaluated in the emergency department and was stable.  Chest x-ray unremarkable.  Feel that his symptoms are from a URI.  Already has some codeine at home to take for his cough.  Feel that they are suitable for outpatient workup at this time so we will have them follow-up with their primary doctor in 2 to 3 days.  This patient presents to the ED for concern of complaints listed in HPI, this involves an extensive number of treatment options, and is a complaint that carries with it a high risk of complications and morbidity. Disposition including potential need for admission considered.   Dispo: DC Home. Return precautions discussed including, but not limited to, those listed in the AVS. Allowed pt time to ask questions which were answered fully prior to dc.  Additional history obtained from spouse Records reviewed Outpatient Clinic Notes I independently reviewed the following imaging with scope of interpretation limited to determining acute life threatening conditions related to emergency care: Chest x-ray and agree with the radiologist interpretation with the following exceptions: none I have reviewed the patients home medications and made adjustments as needed Social Determinants of health:  Geriatric  Portions of this note were generated with Scientist, clinical (histocompatibility and immunogenetics). Dictation errors may occur despite best attempts at proofreading.     Final Clinical Impression(s) / ED Diagnoses Final diagnoses:  Viral URI with cough    Rx / DC Orders ED Discharge Orders      None         Ninetta Basket, MD 08/15/23 1600

## 2023-08-15 NOTE — ED Triage Notes (Signed)
 Pt arrived via POV c/o cough and sore throat X 3 weeks. Pt reports being around recent people at church with Covid. Pt reports OTC medications have not helped. Pt reports seeing PCP yesterday and being started on azithromycin.

## 2023-08-15 NOTE — Discharge Instructions (Signed)
 You were seen for your upper respiratory tract infection in the emergency department.   At home, please use Tylenol  for muscle aches and fevers.  Please use over-the-counter cough medication or tea with honey for your cough. You may also take your codeine that you were prescribed.   Follow-up with your primary doctor in 2-3 days regarding your visit.  This may be over the phone.  Return immediately to the emergency department if you experience any of the following: Difficulty breathing, or any other concerning symptoms.    Thank you for visiting our Emergency Department. It was a pleasure taking care of you today.

## 2023-08-30 ENCOUNTER — Inpatient Hospital Stay: Payer: Self-pay | Admitting: Family Medicine

## 2023-09-10 ENCOUNTER — Encounter: Payer: Self-pay | Admitting: Pharmacist

## 2023-09-10 ENCOUNTER — Ambulatory Visit: Attending: Cardiovascular Disease | Admitting: Pharmacist

## 2023-09-10 ENCOUNTER — Encounter: Admitting: Internal Medicine

## 2023-09-10 VITALS — BP 151/67 | HR 56

## 2023-09-10 DIAGNOSIS — I2510 Atherosclerotic heart disease of native coronary artery without angina pectoris: Secondary | ICD-10-CM | POA: Diagnosis not present

## 2023-09-10 DIAGNOSIS — E785 Hyperlipidemia, unspecified: Secondary | ICD-10-CM | POA: Diagnosis not present

## 2023-09-10 NOTE — Progress Notes (Signed)
 Patient ID: [name]                 DOB: 1945-03-21                      MRN: [id number]     HPI: [name] is a 78 y.o. male referred by Dr. Court to HTN clinic. PMH is significant for CAD, NSTEMI, PAF, HTN, and anxiety.  Patient seen by Dr Court on 07/16/23. Hypertensive in room. Amlodipine  was increased to 10mg  daily and patient was instructed to take home readings and follow up.  Patient presents today with wife. Nervous about new building and heights. Has to walk against the wall, away from the windows due to fear of heights.   Tolerating increase in amlodipine . Home readings are better controlled and he reports no adverse effects. Denies chest pain, SOB.  Home readings: 6/24 131/51 6/23: 134/60, 130/58 6/22: 133/62, 130/60 6/16: 136/67, 132/65  Current HTN meds:  Amlodipine  10mg  daily Imdur  30mg  daily Metoprolol  25mg  BID  BP goal: <130/80  Wt Readings from Last 3 Encounters:  08/15/23 139 lb 1.8 oz (63.1 kg)  08/14/23 139 lb 1.9 oz (63.1 kg)  07/16/23 144 lb 6.4 oz (65.5 kg)   BP Readings from Last 3 Encounters:  09/10/23 (!) 151/67  08/15/23 (!) 140/70  08/14/23 (!) 178/80   Pulse Readings from Last 3 Encounters:  09/10/23 (!) 56  08/15/23 60  08/14/23 64    Renal function: CrCl cannot be calculated (Patient's most recent lab result is older than the maximum 21 days allowed.).  Past Medical History:  Diagnosis Date   Anxiety    Atrial fibrillation (HCC)    Cancer (HCC)    testicular    Chest pain    myoview  07/09/12-normal, nl ef; echo 04/12/05-ef>55%, mild aortic sclerosis   GERD (gastroesophageal reflux disease)    H. pylori infection    Headache    Hypercholesteremia    Hyperlipidemia    Hypertension    Hyponatremia 2013   Palpitations    event monior 04/11/05- SR with occ PAC   Personal history of colonic polyps 2 9 2007    Current Outpatient Medications on File Prior to Visit  Medication Sig Dispense Refill   acetaminophen  (TYLENOL )  500 MG tablet Take 250 mg by mouth 2 (two) times daily as needed for moderate pain or headache.     ALPRAZolam  (XANAX ) 0.5 MG tablet Take 1 tablet (0.5 mg total) by mouth 3 (three) times daily as needed for anxiety. 30 tablet 0   amLODipine  (NORVASC ) 10 MG tablet Take 1 tablet (10 mg total) by mouth daily. 90 tablet 3   apixaban  (ELIQUIS ) 5 MG TABS tablet Take 1 tablet (5 mg total) by mouth 2 (two) times daily. 42 tablet 0   aspirin  EC 81 MG tablet Take 1 tablet (81 mg total) by mouth daily. Swallow whole.     atorvastatin  (LIPITOR ) 80 MG tablet Take 1 tablet (80 mg total) by mouth daily. 100 tablet 3   benzonatate  (TESSALON ) 200 MG capsule Take 1 capsule (200 mg total) by mouth 2 (two) times daily as needed for cough. 20 capsule 1   isosorbide  mononitrate (IMDUR ) 30 MG 24 hr tablet Take 1 tablet (30 mg total) by mouth daily. 90 tablet 3   lidocaine  (XYLOCAINE ) 2 % solution Use as directed 15 mLs in the mouth or throat as needed. 100 mL 0   metoprolol  tartrate (LOPRESSOR ) 25 MG tablet  TAKE (1) TABLET BY MOUTH TWICE DAILY. 180 tablet 3   pantoprazole  (PROTONIX ) 40 MG tablet TAKE ONE TABLET BY MOUTH ONCE DAILY. 90 tablet 0   No current facility-administered medications on file prior to visit.    Allergies  Allergen Reactions   Lisinopril Swelling    H/o of angioedema    Chlorthalidone  Other (See Comments)    Causes severe hyponatremia   Flexeril [Cyclobenzaprine]     Didn't feel right   Hyoscyamine  Swelling   Prevacid [Lansoprazole] Swelling   Valium [Diazepam] Other (See Comments)    makes me feel crazy   Potassium-Containing Compounds Palpitations     Assessment/Plan:  1. Hypertension -  HYPERTENSION CONTROL Vitals:   09/10/23 1009 09/10/23 1023  BP: (!) 152/72 (!) 151/67    The patient's blood pressure is elevated above target today.  In order to address the patient's elevated BP: The blood pressure is usually elevated in clinic.  Blood pressures monitored at home have  been optimal.    Patient BP in room 151/67 which is above goal of <130/80 and similar to reading at last visit. [place] HTN and anxiety likely playing a role. Home readings slightly above goal but much better controlled than clinic readings.  Gave extensive diet education as well. Encouraged decreasing sodium and sugar intake. Concentrate on vegetables, proteins, healthy fats and whole grains.   Will continue current meds and have patient continue to monitor. Contact clinic if readings trend upwards.  Continue: Amlodipine  10mg  daily Imdur  30mg  daily Metoprolol  25mg  BID Continue to monitor at home  Chris Vasilia Dise, PharmD, [place], CDCES, CPP [facility] [address] [place], [place] [address] Phone: [phone]; Fax: [phone] 09/10/2023 12:54 PM

## 2023-09-10 NOTE — Patient Instructions (Addendum)
 Nice meeting you two today  We would like your blood pressure to be less than 130/80  Your blood pressure readings at home are much improved. Please continue checking your blood pressure at home  Please continue: Amlodipine  10mg  daily Isosorbide  30mg  daily Metoprolol  25mg  twice a day  Try to limit the amounts of sugar and salt you are eating  Please let us  know if your blood pressure readings trend upwards  Medford Bolk, PharmD, BCACP, CDCES, CPP [facility] [address], [place], [place] [address] Phone: [phone]; Fax: [phone] 09/10/2023 10:29 AM

## 2023-09-12 ENCOUNTER — Ambulatory Visit (INDEPENDENT_AMBULATORY_CARE_PROVIDER_SITE_OTHER): Admitting: Gastroenterology

## 2023-09-12 ENCOUNTER — Encounter (INDEPENDENT_AMBULATORY_CARE_PROVIDER_SITE_OTHER): Payer: Self-pay | Admitting: Gastroenterology

## 2023-09-12 VITALS — BP 130/69 | HR 60 | Temp 97.8°F | Ht 68.0 in | Wt 142.0 lb

## 2023-09-12 DIAGNOSIS — K589 Irritable bowel syndrome without diarrhea: Secondary | ICD-10-CM | POA: Diagnosis not present

## 2023-09-12 DIAGNOSIS — R12 Heartburn: Secondary | ICD-10-CM | POA: Diagnosis not present

## 2023-09-12 DIAGNOSIS — R1013 Epigastric pain: Secondary | ICD-10-CM

## 2023-09-12 DIAGNOSIS — K219 Gastro-esophageal reflux disease without esophagitis: Secondary | ICD-10-CM

## 2023-09-12 MED ORDER — PANTOPRAZOLE SODIUM 40 MG PO TBEC: 40.0000 mg | DELAYED_RELEASE_TABLET | Freq: Two times a day (BID) | ORAL | 1 refills | Status: DC

## 2023-09-12 NOTE — Patient Instructions (Signed)
 Continue pantoprazole  40 mg twice a day for 3 months, then decrease to once a day

## 2023-09-12 NOTE — Progress Notes (Signed)
 [name], M.D. Gastroenterology & Hepatology [facility] Gastroenterology [address] [place], [place] [address]  Primary Care Physician: Rosamond Leta NOVAK, MD [address] [place] [address]  I will communicate my assessment and recommendations to the referring MD via EMR.  Problems: Recurrent heartburn, possibly related to GERD IBS Candida esophagitis - resolved   History of Present Illness: [name] is a 78 y.o. male with past medical history of anxiety, atrial fibrillation, testicular cancer, GERD, hyperlipidemia, hypertension, IBS, who presents for follow up of recurrent heartburn.  The patient was last seen on 11/12/2022. At that time, the patient was advised to take omeprazole as needed if he has recurrent episodes of heartburn as he was having very seldom symptoms.  Patient states that close to 7 weeks ago he developed new onset of cough and mouth sores in the back part of his palatine area. Reports that he felt significant discomfort with this. He also felt significant sore throat all the time described as a burning sensation. No odynophagia or dysphagia. He decided to go back on Protonix . He saw his PCP, who prescribed lidocaine  and benzotate. Due to persistent symptoms, he restarted the pantoprazole .  The patient saw an ENT on 09/03/2023 Oneil Pry, [name]).  Patient had presented burning in throat for 1 month and went back to Protonix  40 mg once a day.  Underwent indirect mirror exam which showed normal base of tongue, vocal cord with good mobility with mild to moderate post glottic edema.  He was advised to increase Protonix  to twice a day for possible LPR. Since he made this change, he has felt better.  The patient denies having any nausea, vomiting, fever, chills, hematochezia, melena, hematemesis, abdominal distention, abdominal pain, diarrhea, jaundice, pruritus or weight loss.  Last EGD January 2024: -White nummular lesions in the  esophageal mucosa, Candida esophagitis (treated with fluconazole ) -Normal stomach status post biopsy, chronic gastritis, H. pylori negative -Normal duodenum   Last Colonoscopy:06/28/2021 There was presence of a small polyp in the splenic flexure, 4 polyps in the rectum, transverse colon and a 12 mm serrated polyp in the hepatic flexure.  All polyps were removed, the largest polyp was removed with a hot snare, tattoo was placed next to this polypectomy..  Scar was noted in the mid sigmoid from previous polypectomy.   A. COLON, SPLENIC FLEXURE, HEPATIC FLEXURE, RECTUM, CECAL, TRANSVERSE,  POLYPECTOMY:  Tubular adenomas, a sessile serrated polyp without cytologic dysplasia,  hyperplastic polyp and a benign hamartomatous polyp with.  Negative for high-grade dysplasia.   B. COLON, HEPATIC FLEXURE, POLYPECTOMY:  Inflammatory pseudopolyp (polypoid granulation tissue with ulceration of  the overlying epithelium) with an associated hyperplastic polyp.  Negative for dysplasia and malignancy.    Recommended repeat colonoscopy in 5 years  Past Medical History: Past Medical History:  Diagnosis Date   Anxiety    Atrial fibrillation (HCC)    Cancer (HCC)    testicular    Chest pain    myoview  07/09/12-normal, nl ef; echo 04/12/05-ef>55%, mild aortic sclerosis   GERD (gastroesophageal reflux disease)    H. pylori infection    Headache    Hypercholesteremia    Hyperlipidemia    Hypertension    Hyponatremia 2013   Palpitations    event monior 04/11/05- SR with occ PAC   Personal history of colonic polyps 2 9 2007    Past Surgical History: Past Surgical History:  Procedure Laterality Date   BACK SURGERY     BIOPSY  04/17/2022   Procedure: BIOPSY;  Surgeon: Eartha Angelia Sieving, MD;  [facility];  Service: Gastroenterology;;   CATARACT EXTRACTION W/PHACO Left 12/08/2012   Procedure: CATARACT EXTRACTION PHACO AND INTRAOCULAR LENS PLACEMENT (IOC);  Surgeon: Cherene Mania, MD;   Location: [facility];  Service: Ophthalmology;  Laterality: Left;  CDE:  23.19   CATARACT EXTRACTION W/PHACO Right 04/15/2014   Procedure: CATARACT EXTRACTION PHACO AND INTRAOCULAR LENS PLACEMENT RIGHT;  Surgeon: Cherene Mania, MD;  Location: [facility];  Service: Ophthalmology;  Laterality: Right;  CDE:10.32   COLONOSCOPY  12/06/2010   Procedure: COLONOSCOPY;  Surgeon: Claudis RAYMOND Rivet, MD;  [facility];  Service: Endoscopy;  Laterality: N/A;   COLONOSCOPY N/A 05/23/2016   Procedure: COLONOSCOPY;  Surgeon: Claudis RAYMOND Rivet, MD;  [facility];  Service: Endoscopy;  Laterality: N/A;  930   COLONOSCOPY WITH PROPOFOL  N/A 06/28/2021   Procedure: COLONOSCOPY WITH PROPOFOL ;  Surgeon: Rivet Claudis RAYMOND, MD;  [facility];  Service: Endoscopy;  Laterality: N/A;  1045   CORONARY STENT INTERVENTION N/A 02/24/2018   Procedure: CORONARY STENT INTERVENTION;  Surgeon: Court Dorn PARAS, MD;  Location: [facility];  Service: Cardiovascular;  Laterality: N/A;   ESOPHAGOGASTRODUODENOSCOPY N/A 07/20/2015   Procedure: ESOPHAGOGASTRODUODENOSCOPY (EGD);  Surgeon: Claudis RAYMOND Rivet, MD;  [facility];  Service: Endoscopy;  Laterality: N/A;  2:55 - moved to 1:55 - Ann notified pt   ESOPHAGOGASTRODUODENOSCOPY (EGD) WITH PROPOFOL  N/A 04/17/2022   Procedure: ESOPHAGOGASTRODUODENOSCOPY (EGD) WITH PROPOFOL ;  Surgeon: Eartha Angelia Sieving, MD;  [facility];  Service: Gastroenterology;  Laterality: N/A;  215pm, asa 3   EYE SURGERY     LEFT HEART CATH AND CORONARY ANGIOGRAPHY N/A 02/24/2018   Procedure: LEFT HEART CATH AND CORONARY ANGIOGRAPHY;  Surgeon: Court Dorn PARAS, MD;  Location: [facility];  Service: Cardiovascular;  Laterality: N/A;   LUMBAR DISC SURGERY Left 2007   L4-5 ruptured discs   POLYPECTOMY  05/23/2016   Procedure: POLYPECTOMY;  Surgeon: Claudis RAYMOND Rivet, MD;  [facility];  Service: Endoscopy;;  colon   POLYPECTOMY  06/28/2021   Procedure:  POLYPECTOMY;  Surgeon: Rivet Claudis RAYMOND, MD;  [facility];  Service: Endoscopy;;  splenic flexure;hepatic flexurex2; cecal polyp;transverse colon polyp;rectal polyp;    Family History: Family History  Problem Relation Age of Onset   Hypertension Father    Hyperlipidemia Father    CAD Father        H/O CABG    Social History: Social History   Tobacco Use  Smoking Status Never   Passive exposure: Past  Smokeless Tobacco Never   Social History   Substance and Sexual Activity  Alcohol  Use No   Social History   Substance and Sexual Activity  Drug Use No    Allergies: Allergies  Allergen Reactions   Lisinopril Swelling    H/o of angioedema    Chlorthalidone  Other (See Comments)    Causes severe hyponatremia   Flexeril [Cyclobenzaprine]     Didn't feel right   Hyoscyamine  Swelling   Prevacid [Lansoprazole] Swelling   Valium [Diazepam] Other (See Comments)    makes me feel crazy   Potassium-Containing Compounds Palpitations    Medications: Current Outpatient Medications  Medication Sig Dispense Refill   acetaminophen  (TYLENOL ) 500 MG tablet Take 250 mg by mouth 2 (two) times daily as needed for moderate pain or headache.     ALPRAZolam  (XANAX ) 0.5 MG tablet Take 1 tablet (  0.5 mg total) by mouth 3 (three) times daily as needed for anxiety. 30 tablet 0   amLODipine  (NORVASC ) 10 MG tablet Take 1 tablet (10 mg total) by mouth daily. 90 tablet 3   apixaban  (ELIQUIS ) 5 MG TABS tablet Take 1 tablet (5 mg total) by mouth 2 (two) times daily. 42 tablet 0   aspirin  EC 81 MG tablet Take 1 tablet (81 mg total) by mouth daily. Swallow whole.     atorvastatin  (LIPITOR ) 80 MG tablet Take 1 tablet (80 mg total) by mouth daily. 100 tablet 3   isosorbide  mononitrate (IMDUR ) 30 MG 24 hr tablet Take 1 tablet (30 mg total) by mouth daily. 90 tablet 3   metoprolol  tartrate (LOPRESSOR ) 25 MG tablet TAKE (1) TABLET BY MOUTH TWICE DAILY. 180 tablet 3   pantoprazole  (PROTONIX )  40 MG tablet TAKE ONE TABLET BY MOUTH ONCE DAILY. (Patient taking differently: Take 40 mg by mouth 2 (two) times daily.) 90 tablet 0   lidocaine  (XYLOCAINE ) 2 % solution Use as directed 15 mLs in the mouth or throat as needed. (Patient not taking: Reported on 09/12/2023) 100 mL 0   No current facility-administered medications for this visit.    Review of Systems: GENERAL: negative for malaise, night sweats HEENT: No changes in hearing or vision, no nose bleeds or other nasal problems. NECK: Negative for lumps, goiter, pain and significant neck swelling RESPIRATORY: Negative for cough, wheezing CARDIOVASCULAR: Negative for chest pain, leg swelling, palpitations, orthopnea GI: SEE HPI MUSCULOSKELETAL: Negative for joint pain or swelling, back pain, and muscle pain. SKIN: Negative for lesions, rash PSYCH: Negative for sleep disturbance, mood disorder and recent psychosocial stressors. HEMATOLOGY Negative for prolonged bleeding, bruising easily, and swollen nodes. ENDOCRINE: Negative for cold or heat intolerance, polyuria, polydipsia and goiter. NEURO: negative for tremor, gait imbalance, syncope and seizures. The remainder of the review of systems is noncontributory.   Physical Exam: BP 130/69 (BP Location: Left Arm, Patient Position: Sitting, Cuff Size: Normal)   Pulse 60   Temp 97.8 F (36.6 C) (Temporal)   Ht 5' 8 (1.727 m)   Wt 142 lb (64.4 kg)   BMI 21.59 kg/m  GENERAL: The patient is AO x3, in no acute distress. HEENT: Head is normocephalic and atraumatic. EOMI are intact. Mouth is well hydrated and without lesions. NECK: Supple. No masses LUNGS: Clear to auscultation. No presence of rhonchi/wheezing/rales. Adequate chest expansion HEART: RRR, normal s1 and s2. ABDOMEN: Soft, nontender, no guarding, no peritoneal signs, and nondistended. BS +. No masses. EXTREMITIES: Without any cyanosis, clubbing, rash, lesions or edema. NEUROLOGIC: AOx3, no focal motor deficit. SKIN: no  jaundice, no rashes  Imaging/Labs: as above  I personally reviewed and interpreted the available labs, imaging and endoscopic files.  Impression and Plan: [name] is a 78 y.o. male with past medical history of anxiety, atrial fibrillation, testicular cancer, GERD, hyperlipidemia, hypertension, IBS, who presents for follow up of recurrent heartburn. Patient reports that he has presented recurrent issues with heartburn and mouth sores, which actually improved when he came back to PPI BID. She has not presented any red flag signs and has actually improved recently. Had relative endoscopic evaluations that have been negative for other abnormalities. We discussed the possibility of continuing his PPI for a total for 3 months at a BID interval and then decreasing to once a day dosing.  -Continue pantoprazole  40 mg twice a day for 3 months, then decrease to once a day  All questions were answered.  Miguel Fortune, MD Gastroenterology and Hepatology [facility] Gastroenterology

## 2023-10-08 ENCOUNTER — Telehealth: Payer: Self-pay | Admitting: Pharmacist

## 2023-10-08 NOTE — Telephone Encounter (Signed)
 Patient and wife called in to clinic. BP 126/67 this morning after medications.  PCP discontinued amlodipine  due to headaches and started on olmesartan  10mg  daily. Advised to continue to monitor at home and let us  know if it home readings increase.

## 2023-10-16 ENCOUNTER — Telehealth: Payer: Self-pay | Admitting: Pharmacist

## 2023-10-16 DIAGNOSIS — I10 Essential (primary) hypertension: Secondary | ICD-10-CM

## 2023-10-16 NOTE — Telephone Encounter (Signed)
 Patient called to report his blood pressure this morning was in the 150s before medication. Went for a 30 minute walk and systolic reading dropped to 130s. Having headaches as well. Will increase olmesartan  to 20mg  once daily and recommend he hold Imdur  for now to see if headaches go away.  Patient will call back at end of the week with an update.

## 2023-10-18 NOTE — Telephone Encounter (Signed)
 Patient called back to give update. Still has headache although not as severe. BP was elevated this morning in 150s despite taking olmesartan  20mg . PCP had previously d/c amlodipine  due to headaches but he has not been taking and headaches are persisting. Will have pt restart amlodipine  5mg  in the evening and patient will call next week with update.

## 2023-10-21 MED ORDER — OLMESARTAN MEDOXOMIL 40 MG PO TABS: 40.0000 mg | ORAL_TABLET | Freq: Every day | ORAL | 5 refills | Status: DC

## 2023-10-21 NOTE — Telephone Encounter (Signed)
 Spoke with patient. Blood pressure remained elevated over the weekend despite restarting amlodipine  5mg  ion the evening. Blood pressure readings in 160s/80s and patient had headache. Will increase olmesartan  to 40mg  once daily in the morning and pt will call back with update,

## 2023-10-21 NOTE — Addendum Note (Signed)
 Addended by: DARRELL BRUCKNER on: 10/21/2023 07:53 AM   Modules accepted: Orders

## 2023-10-29 NOTE — Telephone Encounter (Signed)
 Patient called. BP has improved since restarting amlodipine  5mg . Midday readings now at goal. Patient will continue olmesartan  40 in the morning and amlodipine  5 in the evening and call back in 4 days with update

## 2023-10-29 NOTE — Addendum Note (Signed)
 Addended by: DARRELL BRUCKNER on: 10/29/2023 12:47 PM   Modules accepted: Orders

## 2023-11-01 NOTE — Telephone Encounter (Signed)
 Patient called to report home BP readings: Readings have remained in 120s/70s until yesterday when evening BP was 147/71 and this morning 146/75. Patient had slight headache last night but also consumed more salt than usual during the day. Do not recommend any med changes at this time. Patient will call with update again late next week

## 2023-11-08 ENCOUNTER — Telehealth: Payer: Self-pay | Admitting: Pharmacist

## 2023-11-08 NOTE — Telephone Encounter (Signed)
 Patient called to report BP readings for past week.  Morning readings better controlled. Afternoon readings more elevated. Encouraged patient to continue to limit salt intake.  Morning readings:134/69, 131/69, 128/67 Afternoon readings: 148/74, 142/67  Pulse rate consistently in upper 50s bpm.  Has occasional afternoon headache which is relieved by Tylenol . Patient will continue to monitor and call back with update.

## 2023-11-13 ENCOUNTER — Telehealth: Payer: Self-pay | Admitting: Pharmacist

## 2023-11-13 NOTE — Telephone Encounter (Signed)
 Patient called to give BP update.  8/24: 137/65, 143/65 8/25: 137/72, 135/69 8/26: 126/68, 128/61 8/27: 147/70 (dog is having surgery today and he is worried and did not sleep well)  BP continuing to trend down but limited by headaches. Unclear if from amlodipine  since they do not occur daily. Patient will call back if BP consistently >140

## 2023-11-28 ENCOUNTER — Telehealth: Payer: Self-pay | Admitting: Pharmacist

## 2023-11-28 NOTE — Telephone Encounter (Addendum)
 Patient called to report he is still having persistant headaches. Contacted PCP who is considering a neuro referral. May also be due to amlodipine  (patient previously had frequent headaches on isosorbide ). Will hold amlodipine  to see if symptoms go away and patient will call back to update

## 2023-12-02 ENCOUNTER — Telehealth: Payer: Self-pay | Admitting: Cardiovascular Disease

## 2023-12-02 ENCOUNTER — Emergency Department (HOSPITAL_COMMUNITY)

## 2023-12-02 ENCOUNTER — Emergency Department (HOSPITAL_COMMUNITY)
Admission: EM | Admit: 2023-12-02 | Discharge: 2023-12-03 | Disposition: A | Discharge status: Home / Self Care | Attending: Emergency Medicine | Admitting: Emergency Medicine

## 2023-12-02 DIAGNOSIS — Z7901 Long term (current) use of anticoagulants: Secondary | ICD-10-CM | POA: Diagnosis not present

## 2023-12-02 DIAGNOSIS — M5412 Radiculopathy, cervical region: Secondary | ICD-10-CM

## 2023-12-02 DIAGNOSIS — R519 Headache, unspecified: Secondary | ICD-10-CM

## 2023-12-02 DIAGNOSIS — Z955 Presence of coronary angioplasty implant and graft: Secondary | ICD-10-CM | POA: Diagnosis not present

## 2023-12-02 DIAGNOSIS — I2510 Atherosclerotic heart disease of native coronary artery without angina pectoris: Secondary | ICD-10-CM | POA: Insufficient documentation

## 2023-12-02 DIAGNOSIS — I4891 Unspecified atrial fibrillation: Secondary | ICD-10-CM | POA: Insufficient documentation

## 2023-12-02 DIAGNOSIS — I10 Essential (primary) hypertension: Secondary | ICD-10-CM | POA: Insufficient documentation

## 2023-12-02 DIAGNOSIS — I4510 Unspecified right bundle-branch block: Secondary | ICD-10-CM | POA: Insufficient documentation

## 2023-12-02 DIAGNOSIS — R200 Anesthesia of skin: Secondary | ICD-10-CM | POA: Diagnosis present

## 2023-12-02 DIAGNOSIS — I700 Atherosclerosis of aorta: Secondary | ICD-10-CM | POA: Diagnosis not present

## 2023-12-02 LAB — CBC
HCT: 39.1 % (ref 39.0–52.0)
Hemoglobin: 12.9 g/dL — ABNORMAL LOW (ref 13.0–17.0)
MCH: 30.1 pg (ref 26.0–34.0)
MCHC: 33 g/dL (ref 30.0–36.0)
MCV: 91.1 fL (ref 80.0–100.0)
Platelets: 260 K/uL (ref 150–400)
RBC: 4.29 MIL/uL (ref 4.22–5.81)
RDW: 12.8 % (ref 11.5–15.5)
WBC: 9.7 K/uL (ref 4.0–10.5)
nRBC: 0.2 % (ref 0.0–0.2)

## 2023-12-02 LAB — DIFFERENTIAL
Abs Immature Granulocytes: 0.02 K/uL (ref 0.00–0.07)
Basophils Absolute: 0.1 K/uL (ref 0.0–0.1)
Basophils Relative: 1 %
Eosinophils Absolute: 0.5 K/uL (ref 0.0–0.5)
Eosinophils Relative: 5 %
Immature Granulocytes: 0 %
Lymphocytes Relative: 26 %
Lymphs Abs: 2.5 K/uL (ref 0.7–4.0)
Monocytes Absolute: 1 K/uL (ref 0.1–1.0)
Monocytes Relative: 11 %
Neutro Abs: 5.6 K/uL (ref 1.7–7.7)
Neutrophils Relative %: 57 %

## 2023-12-02 LAB — APTT: aPTT: 36 s (ref 24–36)

## 2023-12-02 LAB — PROTIME-INR
INR: 1.1 (ref 0.8–1.2)
Prothrombin Time: 15 s (ref 11.4–15.2)

## 2023-12-02 LAB — CBG MONITORING, ED: Glucose-Capillary: 125 mg/dL — ABNORMAL HIGH (ref 70–99)

## 2023-12-02 NOTE — ED Notes (Signed)
PA evaluating patient in triage.

## 2023-12-02 NOTE — Telephone Encounter (Signed)
 Pt c/o BP issue: STAT if pt c/o blurred vision, one-sided weakness or slurred speech.  STAT if BP is GREATER than 180/120 TODAY.  STAT if BP is LESS than 90/60 and SYMPTOMATIC TODAY  1. What is your BP concern? Pt concerned his bp is high   2. Have you taken any BP medication today? Yes   3. What are your last 5 BP readings?  157/77 - last one he took  He states the top number has been around 150-160   4. Are you having any other symptoms (ex. Dizziness, headache, blurred vision, passed out)? Headaches at night, arm feels tingling, anxious, denies CP

## 2023-12-02 NOTE — ED Triage Notes (Signed)
 Patient reports he is having left arm numbness that is off and on and chest pain. States the symptoms began yesterday. States he last notices the symptoms about 4 hours ago. He says the numbness goes away for a little while then starts again. Patient also reports that he hit his head 1 month ago. Patient takes Eliquis  for Afib and previous stents.

## 2023-12-02 NOTE — Telephone Encounter (Signed)
 Returned patient call, no answer. Left message advising patient to call our office back, gave office #.

## 2023-12-02 NOTE — Telephone Encounter (Signed)
 Patient called back and left message on machine asking for a nurse call back regarding left arm weakness

## 2023-12-02 NOTE — Telephone Encounter (Signed)
 Patient held amlodipine  over the weekend to see if headache symptoms went away but they did not. Will restart amlodipine  since BP increased while off therapy. Will place referral to neurology regarding frequent headaches.

## 2023-12-02 NOTE — ED Notes (Addendum)
 Called Charge to make her aware. She is sending a provider to triage.

## 2023-12-03 ENCOUNTER — Emergency Department (HOSPITAL_COMMUNITY)

## 2023-12-03 LAB — BASIC METABOLIC PANEL WITH GFR
Anion gap: 12 (ref 5–15)
BUN: 11 mg/dL (ref 8–23)
CO2: 24 mmol/L (ref 22–32)
Calcium: 9.4 mg/dL (ref 8.9–10.3)
Chloride: 101 mmol/L (ref 98–111)
Creatinine, Ser: 0.94 mg/dL (ref 0.61–1.24)
GFR, Estimated: >60 mL/min (ref >60)
Glucose, Bld: 117 mg/dL — ABNORMAL HIGH (ref 70–99)
Potassium: 3.8 mmol/L (ref 3.5–5.1)
Sodium: 137 mmol/L (ref 135–145)

## 2023-12-03 LAB — TROPONIN I (HIGH SENSITIVITY)
Troponin I (High Sensitivity): 8 ng/L (ref <18)
Troponin I (High Sensitivity): 8 ng/L (ref <18)

## 2023-12-03 LAB — ETHANOL: Alcohol, Ethyl (B): <15 mg/dL (ref <15)

## 2023-12-03 NOTE — ED Notes (Signed)
 Pt called for vitals x3, no response.

## 2023-12-03 NOTE — ED Notes (Addendum)
 Assumed care at this time. Received pt with multiple other critical pts. Moved from w/r after 8.75 hrs. Labs, EKG, VS,hx, allergies, meds and imaging reviewed. EDP speaking with pt at this time. Pt alert, NAD, calm, interactive.

## 2023-12-03 NOTE — Discharge Instructions (Addendum)
 Thank you, [name], for allowing us  to provide your care today. Today we discussed . . .  > Numbness of left arm       - We discussed the numbness in your left arm at today's visit.  It is most likely due to a pinched nerve at either your neck or your elbow.  As the symptoms have already gone away and there is not much more to do about it at this point.  We did rule out a heart attack and a stroke.  It is likely that the cause of your numbness is nonemergent at this point.  If you continue to have symptoms you could talk to your PCP or an orthopedist. > Postconcussion headache       - Your headache should continue to get better over time but may take several months to resolve.  I have ordered the following labs for you:   Lab Orders         Basic metabolic panel         CBC         Protime-INR         APTT         Differential         Ethanol         I-stat chem 8, ED         CBG monitoring, ED      Follow up: Follow-up as previously scheduled with your PCP       Melvenia Morrison, [place]

## 2023-12-03 NOTE — ED Provider Notes (Signed)
 [place] [facility]GENCY DEPARTMENT AT [facility] Provider Note   CSN: [id number] Arrival date & time: 12/02/23  2255     Patient presents with: Numbness   [name] is a 78 y.o. male with PMH of a-fib on eliquis , CAD S/P cardiac stents, HTN, and HLD who presents with 2 days of transient, episodic numbness in the medial aspect of his right upper extremity extending from his elbow to his pinky. His symptoms would last about 1 to 2 hours before resolving.  Since being in the ER he has had no arm pain.  He did endorse some chest pain that lasted about 10 to 15 seconds that was somewhat concurrent with the arm numbness which prompted him to present to the ER due to concern for an MI given his history.  His symptoms are exacerbated by driving a car and he has had some of these episodes before.  He has no associated chest pain now, shortness of breath, new headaches, nausea, vomiting.  He does have a headache that has been somewhat chronic since he hit a glass wall with his head and experienced what sounds like a concussion.  Home medications: Eliquis  5mg  BID Metoprolol  25mg  BID Olmesartan  40mg  Lipitor  40mg  Amlodipine  5mg    Allergies: Lisinopril, Chlorthalidone , Flexeril [cyclobenzaprine], Hyoscyamine , Prevacid [lansoprazole], Valium [diazepam], and Potassium-containing compounds    Review of Systems  Updated Vital Signs BP (!) 178/77 (BP Location: Right Arm)   Pulse 60   Temp 97.7 F (36.5 C) (Oral)   Resp 17   Ht 5' 8 (1.727 m)   Wt 65.8 kg   SpO2 100%   BMI 22.05 kg/m   Physical Exam Constitutional:      General: He is not in acute distress.    Appearance: He is not ill-appearing.  Eyes:     Extraocular Movements: Extraocular movements intact.     Pupils: Pupils are equal, round, and reactive to light.  Cardiovascular:     Rate and Rhythm: Normal rate and regular rhythm.  Pulmonary:     Effort: No respiratory distress.     Breath sounds: Normal breath sounds.   Musculoskeletal:        General: No swelling or tenderness.     Cervical back: Normal range of motion.  Neurological:     Mental Status: He is alert and oriented to person, place, and time.     Cranial Nerves: No cranial nerve deficit.     Sensory: Sensory deficit present.     Motor: No weakness.     Comments: Mild numbness on the medial aspect of the left pinky compared to the lateral aspect.  Negative Tinel's over the elbow and wrist.  Normal sensation proximal to the wrist.     (all labs ordered are listed, but only abnormal results are displayed) Labs Reviewed  BASIC METABOLIC PANEL WITH GFR - Abnormal; Notable for the following components:      Result Value   Glucose, Bld 117 (*)    All other components within normal limits  CBC - Abnormal; Notable for the following components:   Hemoglobin 12.9 (*)    All other components within normal limits  CBG MONITORING, ED - Abnormal; Notable for the following components:   Glucose-Capillary 125 (*)    All other components within normal limits  PROTIME-INR  APTT  DIFFERENTIAL  ETHANOL  I-STAT CHEM 8, ED  TROPONIN I (HIGH SENSITIVITY)  TROPONIN I (HIGH SENSITIVITY)    EKG: EKG Interpretation Date/Time:  Monday  December 02 2023 23:12:34 EDT Ventricular Rate:  61 PR Interval:  182 QRS Duration:  118 QT Interval:  476 QTC Calculation: 479 R Axis:   63  Text Interpretation: Normal sinus rhythm Incomplete right bundle branch block Nonspecific ST abnormality Abnormal ECG When compared with ECG of 19-Jun-2023 12:32, No significant change was found Confirmed by Raford Lenis ([id number]) on 12/03/2023 1:28:10 AM  Radiology: CT Head Wo Contrast Result Date: 12/03/2023 CLINICAL DATA:  TIA symptoms EXAM: CT HEAD WITHOUT CONTRAST TECHNIQUE: Contiguous axial images were obtained from the base of the skull through the vertex without intravenous contrast. RADIATION DOSE REDUCTION: This exam was performed according to the departmental  dose-optimization program which includes automated exposure control, adjustment of the mA and/or kV according to patient size and/or use of iterative reconstruction technique. COMPARISON:  06/17/2023 FINDINGS: Brain: No evidence of acute infarction, hemorrhage, hydrocephalus, extra-axial collection or mass lesion/mass effect. Vascular: No hyperdense vessel or unexpected calcification. Skull: Normal. Negative for fracture or focal lesion. Sinuses/Orbits: No acute finding. Other: None. IMPRESSION: No acute intracranial abnormality noted. Electronically Signed   By: Oneil Devonshire M.D.   On: 12/03/2023 01:29   DG Chest 2 View Result Date: 12/02/2023 CLINICAL DATA:  Encounter for chest pain. EXAM: CHEST - 2 VIEW COMPARISON:  PA Lat chest 08/15/2023 FINDINGS: The heart size and mediastinal contours are within normal limits. There is a stent in the right coronary artery. There is aortic atherosclerosis. Both lungs are clear. The visualized skeletal structures are intact with osteopenia and thoracic spondylosis. Compare: Stable chest. IMPRESSION: No evidence of acute chest disease. Aortic atherosclerosis. Right coronary artery stent. Electronically Signed   By: Francis Quam M.D.   On: 12/02/2023 23:31   Procedures   Medications Ordered in the ED - No data to display                                Medical Decision Making Amount and/or Complexity of Data Reviewed Labs: ordered. Radiology: ordered.   This patient presents to the ED for concern of left arm numbness and chest pain, this involves an extensive number of treatment options, and is a complaint that carries with it a high risk of complications and morbidity.  The differential diagnosis includes MI, stroke, cervical radiculopathy, cubital tunnel syndrome, TIA.  His symptoms have now resolved.  Based on his history of exacerbation with driving and numbness worse on the medial side of the pinky compared to the lateral his symptoms are most likely this  is some sort of peripheral neuropathy and either a cervical radiculopathy or a cubital tunnel syndrome.  CT head showed no acute abnormality.  EKG was not acute change since previous EKG.  His troponins were negative.  There are no signs for emergent causes of his numbness and tingling.  We will refer him to neurosurgery for further evaluation in the outpatient setting.   Co morbidities that complicate the patient evaluation  Anxiety, hypertension, CAD     Lab Tests:  I Ordered, and personally interpreted labs.  The pertinent results include:  Troponin within normal limits. Mildly above normal glucose, non-fasting. Rest of BMP within normal limits. Mildly decreased hemoglobin.   Imaging Studies ordered:  I ordered imaging studies including CT head  I independently visualized and interpreted imaging which showed no acute findings. I agree with the radiologist interpretation   Cardiac Monitoring: / EKG:  EKG showed incomplete right bundle branch block  with normal sinus rhythm.   Test / Admission - Considered:  Patient did not require admission.  His vital signs were stable his hypertension decreased throughout his stay.  His labs and imaging did not show any emergent causes for his arm numbness.  He can follow-up with neurosurgery outpatient for this problem.   Final diagnoses:  None    ED Discharge Orders     None          Napoleon Limes, MD 12/03/23 [id number]    Doretha Folks, MD 12/03/23 1326

## 2023-12-03 NOTE — ED Notes (Signed)
 Pt remains in lobby, vitals updated, pt did not hear name for vitals re-check.

## 2024-01-06 ENCOUNTER — Other Ambulatory Visit (HOSPITAL_COMMUNITY): Payer: Self-pay | Admitting: Internal Medicine

## 2024-01-06 DIAGNOSIS — G43009 Migraine without aura, not intractable, without status migrainosus: Secondary | ICD-10-CM

## 2024-01-11 ENCOUNTER — Ambulatory Visit (HOSPITAL_COMMUNITY)
Admission: RE | Admit: 2024-01-11 | Discharge: 2024-01-11 | Disposition: A | Discharge status: Home / Self Care | Source: Ambulatory Visit | Attending: Internal Medicine | Admitting: Internal Medicine

## 2024-01-11 DIAGNOSIS — G43009 Migraine without aura, not intractable, without status migrainosus: Secondary | ICD-10-CM | POA: Diagnosis present

## 2024-01-22 ENCOUNTER — Encounter (INDEPENDENT_AMBULATORY_CARE_PROVIDER_SITE_OTHER): Payer: Self-pay | Admitting: Gastroenterology

## 2024-02-10 ENCOUNTER — Encounter (INDEPENDENT_AMBULATORY_CARE_PROVIDER_SITE_OTHER): Payer: Self-pay | Admitting: Gastroenterology

## 2024-03-16 ENCOUNTER — Telehealth: Payer: Self-pay | Admitting: Cardiovascular Disease

## 2024-03-16 NOTE — Telephone Encounter (Signed)
 Spoke with pt who is extremely nervous (as stated himself) because he can feel his heart skipping beats.  Reports it started about 2 or 3 days ago and did not get much sleep last night because of it.  He reports taking medications as prescribed except for Amlodipine  d/t side effect of headache.  Denies missing any recent doses of Eliquis .  Has not checked HR or BP.  HX: At Fib. Unable to check HR at this time because he is in the car driving to the office to schedule an appointment.  Pt asking same questions repeatedly and apologizes for being nervous.  Advised pt we will need to know his HR and BP before further instructions r/t palpitation and possible At Fib can be given.  Advised if any new s/s occur such as chest pain, shortness of breath etc he should report to his closest ED for further evaluation.  He reports he is currently going back home and will check his HR/BP.  Also reports he will take his Alprazolam .  Advised if HR is consistently above 100 bpm to report to closest ED for eval and treatment.  Advised to avoid all stimulants such as caffeine, nicotine and alcohol .  Pt states understanding.   He will call back prior to appt tomorrow at 1:20 pm if further questions or concerns.

## 2024-03-16 NOTE — Telephone Encounter (Signed)
 Patient c/o Palpitations: STAT if patient c/o lightheadedness, shortness of breath, or chest pain  How long have you had palpitations/irregular HR/ Afib? Are you having the symptoms now? 2 days   Are you currently experiencing lightheadedness, SOB or CP? no  Do you have a history of afib (atrial fibrillation) or irregular heart rhythm? no  Have you checked your BP or HR? (document readings if available):  BP-140-160's    Are you experiencing any other symptoms?  No  Pt requesting to see Dr. Court sooner.

## 2024-03-17 ENCOUNTER — Encounter: Payer: Self-pay | Admitting: Cardiovascular Disease

## 2024-03-17 ENCOUNTER — Ambulatory Visit: Attending: Cardiovascular Disease | Admitting: Cardiovascular Disease

## 2024-03-17 VITALS — BP 110/60 | HR 62 | Ht 68.0 in | Wt 147.0 lb

## 2024-03-17 DIAGNOSIS — I491 Atrial premature depolarization: Secondary | ICD-10-CM

## 2024-03-17 DIAGNOSIS — I10 Essential (primary) hypertension: Secondary | ICD-10-CM | POA: Diagnosis not present

## 2024-03-17 DIAGNOSIS — I480 Paroxysmal atrial fibrillation: Secondary | ICD-10-CM

## 2024-03-17 DIAGNOSIS — R7303 Prediabetes: Secondary | ICD-10-CM

## 2024-03-17 DIAGNOSIS — D6869 Other thrombophilia: Secondary | ICD-10-CM | POA: Diagnosis not present

## 2024-03-17 DIAGNOSIS — I2510 Atherosclerotic heart disease of native coronary artery without angina pectoris: Secondary | ICD-10-CM | POA: Diagnosis not present

## 2024-03-17 DIAGNOSIS — E785 Hyperlipidemia, unspecified: Secondary | ICD-10-CM

## 2024-03-17 DIAGNOSIS — F419 Anxiety disorder, unspecified: Secondary | ICD-10-CM | POA: Diagnosis not present

## 2024-03-17 MED ORDER — METOPROLOL TARTRATE 25 MG PO TABS: ORAL_TABLET | ORAL | 3 refills | Status: AC

## 2024-03-17 MED ORDER — APIXABAN 5 MG PO TABS: 5.0000 mg | ORAL_TABLET | Freq: Two times a day (BID) | ORAL | 0 refills | Status: AC

## 2024-03-17 NOTE — Telephone Encounter (Signed)
 Pt was seen today in office by DOD - Dr Francyne.  Please see that note for further documentation.

## 2024-03-17 NOTE — Patient Instructions (Addendum)
 Medication Instructions:  May take an extra Metoprolol  Tartrate- 1/2 tab to 1 tablet up to 2 times daily as needed for palpitations *If you need a refill on your cardiac medications before your next appointment, please call your pharmacy*  Lab Work: Fasting Lipid panel If you have labs (blood work) drawn today and your tests are completely normal, you will receive your results only by: MyChart Message (if you have MyChart) OR A paper copy in the mail If you have any lab test that is abnormal or we need to change your treatment, we will call you to review the results.  Testing/Procedures: None ordered  Follow-Up: At [facility], you and your health needs are our priority.  As part of our continuing mission to provide you with exceptional heart care, our providers are all part of one team.  This team includes your primary Cardiologist (physician) and Advanced Practice Providers or APPs (Physician Assistants and Nurse Practitioners) who all work together to provide you with the care you need, when you need it.  Your next appointment:    Will need to schedule appointment with Dr Parker- due in April (can reach out in Feb)   Provider:   Dorn Court, MD    We recommend signing up for the patient portal called MyChart.  Sign up information is provided on this After Visit Summary.  MyChart is used to connect with patients for Virtual Visits (Telemedicine).  Patients are able to view lab/test results, encounter notes, upcoming appointments, etc.  Non-urgent messages can be sent to your provider as well.   To learn more about what you can do with MyChart, go to [email or web address].

## 2024-03-18 ENCOUNTER — Other Ambulatory Visit (INDEPENDENT_AMBULATORY_CARE_PROVIDER_SITE_OTHER): Payer: Self-pay | Admitting: Gastroenterology

## 2024-03-18 DIAGNOSIS — R1013 Epigastric pain: Secondary | ICD-10-CM

## 2024-03-18 DIAGNOSIS — K589 Irritable bowel syndrome without diarrhea: Secondary | ICD-10-CM

## 2024-03-21 NOTE — Progress Notes (Addendum)
 " Cardiology Office Note   Date:  03/21/2024  ID:  Montavius, Subramaniam [birth year], MRN [id number] PCP: Rosamond Leta NOVAK, MD  [facility] Providers Cardiologist:  Dorn Lesches, MD     History of Present Illness [name] is a 79 y.o. male with a history of CAD (mid RCA overlapping Synergy stents x4 in 2019), paroxysmal atrial fibrillation, hypertension and frequent PVCs, anxiety.  He has been having increased frequency of palpitations and asked to be seen today.  His electrocardiogram shows frequent premature atrial contractions  He denies exertional chest pain or dyspnea and his palpitations are not provoked by physical activity.  He has not had dizziness or syncope.  He is very anxious and worries about a lot of problems including his own health and his wife's health.  He has not had problems with lower extremity edema, orthopnea, PND.  He denies any falls, injuries or serious bleeding problems and is compliant with Eliquis .  He has a smart watch capable of ECG monitoring.  It has not shown atrial fibrillation.  He works consulting civil engineer stress through walking and maintaining a healthy lifestyle.  He is on Lipitor , 80 mg daily, for cholesterol management, increased from 40 mg due to elevated cholesterol levels. He plans to have his cholesterol rechecked before his next annual visit with his primary care physician.  He reports occasional chest pain that occurs at rest and resolves quickly and does not resemble the pain experienced prior to his stent placement. He remains concerned about the possibility of atrial fibrillation, especially when traveling.  He had a low risk nuclear stress test and a normal echocardiogram 03/30/2022 when he was briefly hospitalized at [facility]  Studies Reviewed EKG Interpretation Date/Time:  Tuesday March 17 2024 13:33:15 EST Ventricular Rate:  62 PR Interval:  190 QRS Duration:  114 QT Interval:  462 QTC Calculation: 468 R  Axis:   53  Text Interpretation: Sinus rhythm with Premature atrial contractions Incomplete right bundle branch block When compared with ECG of 02-Dec-2023 23:12, Premature atrial contractions are new Confirmed by Noelia Lenart [phone]) on 03/17/2024 1:44:29 PM     Risk Assessment/Calculations  CHA2DS2-VASc Score = 4   This indicates a 4.8% annual risk of stroke. The patient's score is based upon: CHF History: 0 HTN History: 1 Diabetes History: 0 Stroke History: 0 Vascular Disease History: 1 Age Score: 2 Gender Score: 0         Physical Exam VS:  BP 110/60   Pulse 62   Ht 5' 8 (1.727 m)   Wt 147 lb (66.7 kg)   SpO2 92%   BMI 22.35 kg/m        Wt Readings from Last 3 Encounters:  03/17/24 147 lb (66.7 kg)  12/02/23 145 lb (65.8 kg)  09/12/23 142 lb (64.4 kg)    GEN: Well nourished, well developed in no acute distress NECK: No JVD; No carotid bruits CARDIAC: RRR with a rare ectopic beats, no murmurs, rubs, gallops RESPIRATORY:  Clear to auscultation without rales, wheezing or rhonchi  ABDOMEN: Soft, non-tender, non-distended EXTREMITIES:  No edema; No deformity   ASSESSMENT AND PLAN AFib: Seems to have a very low prevalence of arrhythmia.  He is on appropriate anticoagulation.  Anticoagulation:  Well-tolerated without bleeding complications. PACs:  these seem to be responsible for his palpitations, which are brief and not associated with angina, dyspnea or presyncope/syncope.  He can take additional doses of metoprolol  tartrate 12.5-25 mg once or twice  a day if the palpitations are bothering him a lot  (would not increase the scheduled dose of metoprolol  due to his relatively slow resting heart rate around 60 bpm). CAD: No events since placement of a stent in 2019.  Low risk nuclear stress test in January 2024, normal left ventricular systolic function by echo January 2024. HLP: Most recent LDL cholesterol was 86 and his dose of atorvastatin  was adjusted.  Need to  recheck roughly 3 months after the dose change. PreDM: Most recent hemoglobin A1c on file was 6.3%.  He is not on any medicines for glucose lowering. HTN: Well-controlled on current medications.  Continue olmesartan  and amlodipine  and low-dose metoprolol . Anxiety: Potentiates the symptoms from his PVCs       Dispo: May take additional doses of metoprolol  tartrate 12.5 or 25 mg once or twice daily as needed for palpitations.  Check fasting lipid profile after the adjustment in atorvastatin  dose.  Follow-up with Dr. Court  Signed, Jerel Balding, MD   "

## 2024-03-23 DIAGNOSIS — R399 Unspecified symptoms and signs involving the genitourinary system: Secondary | ICD-10-CM

## 2024-03-23 LAB — URINALYSIS, ROUTINE W REFLEX MICROSCOPIC
Bilirubin, UA: NEGATIVE
Glucose, UA: NEGATIVE
Ketones, UA: NEGATIVE
Leukocytes,UA: NEGATIVE
Nitrite, UA: NEGATIVE
Protein,UA: NEGATIVE
Specific Gravity, UA: <1.005 — ABNORMAL LOW (ref 1.005–1.030)
Urobilinogen, Ur: 0.2 mg/dL (ref 0.2–1.0)
pH, UA: 6.5 (ref 5.0–7.5)

## 2024-03-23 LAB — MICROSCOPIC EXAMINATION
Bacteria, UA: NONE SEEN
WBC, UA: NONE SEEN /HPF (ref 0–5)

## 2024-03-23 NOTE — Telephone Encounter (Signed)
" °  Urologic History:  Any Recent Urologic Surgeries or Procedures:no Recurrent UTI's:no Cystitis: no  Prostatitis:no Kidney or Bladder Stones: a long time ago Plan: Walk-in Clinic: no Appointment w/Physician: [no Lab visit scheduled for urine drop off: Yes Advice given:  Do you take on daily medications for UTI suppression No    "

## 2024-03-23 NOTE — Telephone Encounter (Signed)
 Dysuria  Patient called with c/o dysuria x 3-5 days days.  Pain: burning  Severity:8/10  Associated Signs and Symptoms:  Fever: none Chills: no Hematuria: no Urgency: yes Frequency: no Hesitancy:no Incontinence: no Nausea: no Vomiting: no  Message sent to clinic staff to return call to patient to advise of plan.

## 2024-03-24 ENCOUNTER — Ambulatory Visit: Payer: Self-pay

## 2024-03-24 NOTE — Telephone Encounter (Signed)
 Called patient to give him MD Dahlstedt recommendation patient voiced his understanding

## 2024-03-24 NOTE — Telephone Encounter (Signed)
-----   Message from Garnette Shack, MD sent at 03/24/2024  8:11 AM EST ----- Let patient know that his urinalysis looked fine.  It does not look like he has an infection.  He can take AZO if he has burning.

## 2024-03-24 NOTE — Telephone Encounter (Signed)
 Patient presents today with complaints of  Burning .  UA and Culture done today.  Dr. Nieves  reviewed results and No treatment  .  Patient aware of MD recommendations and that we will reach out with culture results.      [email or web address], CMA

## 2024-03-25 LAB — URINE CULTURE: Organism ID, Bacteria: NO GROWTH

## 2024-03-31 ENCOUNTER — Other Ambulatory Visit: Payer: Self-pay | Admitting: Pharmacist

## 2024-03-31 DIAGNOSIS — I10 Essential (primary) hypertension: Secondary | ICD-10-CM

## 2024-03-31 MED ORDER — AMLODIPINE BESYLATE 5 MG PO TABS: 5.0000 mg | ORAL_TABLET | Freq: Every day | ORAL | 3 refills | Status: AC

## 2024-03-31 NOTE — Telephone Encounter (Signed)
 Patient called to request amlodipine  refill

## 2024-04-02 ENCOUNTER — Other Ambulatory Visit: Payer: Self-pay | Admitting: Cardiovascular Disease

## 2024-04-02 ENCOUNTER — Ambulatory Visit: Payer: Self-pay | Admitting: Cardiovascular Disease

## 2024-04-02 ENCOUNTER — Other Ambulatory Visit (HOSPITAL_COMMUNITY)
Admission: RE | Admit: 2024-04-02 | Discharge: 2024-04-02 | Disposition: A | Discharge status: Home / Self Care | Source: Ambulatory Visit | Attending: Cardiovascular Disease | Admitting: Cardiovascular Disease

## 2024-04-02 DIAGNOSIS — E785 Hyperlipidemia, unspecified: Secondary | ICD-10-CM | POA: Insufficient documentation

## 2024-04-02 DIAGNOSIS — I10 Essential (primary) hypertension: Secondary | ICD-10-CM

## 2024-04-02 LAB — LIPID PANEL
Cholesterol: 123 mg/dL (ref 0–200)
HDL: 48 mg/dL
LDL Cholesterol: 59 mg/dL (ref 0–99)
Total CHOL/HDL Ratio: 2.6 ratio
Triglycerides: 82 mg/dL
VLDL: 16 mg/dL (ref 0–40)

## 2024-04-03 MED ORDER — OLMESARTAN MEDOXOMIL 40 MG PO TABS: 40.0000 mg | ORAL_TABLET | Freq: Every day | ORAL | 3 refills | Status: AC

## 2024-04-07 ENCOUNTER — Ambulatory Visit: Admitting: Urology

## 2024-04-07 VITALS — BP 162/66 | HR 61

## 2024-04-07 DIAGNOSIS — R300 Dysuria: Secondary | ICD-10-CM | POA: Diagnosis not present

## 2024-04-07 DIAGNOSIS — R399 Unspecified symptoms and signs involving the genitourinary system: Secondary | ICD-10-CM

## 2024-04-07 LAB — MICROSCOPIC EXAMINATION
Bacteria, UA: NONE SEEN
Epithelial Cells (non renal): NONE SEEN /HPF (ref 0–10)
WBC, UA: NONE SEEN /HPF (ref 0–5)

## 2024-04-07 LAB — URINALYSIS, ROUTINE W REFLEX MICROSCOPIC
Bilirubin, UA: NEGATIVE
Glucose, UA: NEGATIVE
Ketones, UA: NEGATIVE
Leukocytes,UA: NEGATIVE
Nitrite, UA: NEGATIVE
Protein,UA: NEGATIVE
Specific Gravity, UA: <1.005 — ABNORMAL LOW (ref 1.005–1.030)
Urobilinogen, Ur: 0.2 mg/dL (ref 0.2–1.0)
pH, UA: 6.5 (ref 5.0–7.5)

## 2024-04-07 NOTE — Progress Notes (Signed)
 Patient blood pressure value first attempt is high. A repeat blood pressure check completed.  Does patient have a PCP? Yes Has the patient taken then blood pressure medicine today? Yes  Discussed with patient the importance of reviewing their blood pressure with PCP and adhering to their blood medication is the patient is prescribed any. Patient voiced understanding.

## 2024-04-07 NOTE — Progress Notes (Signed)
 "     HPI: [name] was last seen about 2-1/2 years ago.  He was here for prostate check.  He is worried about prostate cancer.  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  Last PSA in the summer 2024 was 0.3.  Comes in today with recent dysuria.  No other lower urinary tract symptoms noted, but 2 to 3 weeks ago noted dysuria.  Perhaps getting some better.  Has been on AZO.  He denies cloudy or foul-smelling urine.   PMH: Past Medical History:  Diagnosis Date   Anxiety    Atrial fibrillation (HCC)    Cancer (HCC)    testicular    Chest pain    myoview  07/09/12-normal, nl ef; echo 04/12/05-ef>55%, mild aortic sclerosis   GERD (gastroesophageal reflux disease)    H. pylori infection    Headache    Hypercholesteremia    Hyperlipidemia    Hypertension    Hyponatremia 2013   Palpitations    event monior 04/11/05- SR with occ PAC   Personal history of colonic polyps 2 9 2007    Surgical History: Past Surgical History:  Procedure Laterality Date   BACK SURGERY     BIOPSY  04/17/2022   Procedure: BIOPSY;  Surgeon: Eartha Angelia Sieving, MD;  Location: [facility];  Service: Gastroenterology;;   CATARACT EXTRACTION W/PHACO Left 12/08/2012   Procedure: CATARACT EXTRACTION PHACO AND INTRAOCULAR LENS PLACEMENT (IOC);  Surgeon: Cherene Mania, MD;  Location: [facility];  Service: Ophthalmology;  Laterality: Left;  CDE:  23.19   CATARACT EXTRACTION W/PHACO Right 04/15/2014   Procedure: CATARACT EXTRACTION PHACO AND INTRAOCULAR LENS PLACEMENT RIGHT;  Surgeon: Cherene Mania, MD;  Location: [facility];  Service: Ophthalmology;  Laterality: Right;  CDE:10.32   COLONOSCOPY  12/06/2010   Procedure: COLONOSCOPY;  Surgeon: Claudis RAYMOND Rivet, MD;  Location: [facility];  Service: Endoscopy;  Laterality: N/A;   COLONOSCOPY N/A 05/23/2016   Procedure: COLONOSCOPY;  Surgeon: Claudis RAYMOND Rivet, MD;  Location: [facility];  Service: Endoscopy;  Laterality: N/A;  930   COLONOSCOPY WITH PROPOFOL  N/A 06/28/2021    Procedure: COLONOSCOPY WITH PROPOFOL ;  Surgeon: Rivet Claudis RAYMOND, MD;  Location: [facility];  Service: Endoscopy;  Laterality: N/A;  1045   CORONARY STENT INTERVENTION N/A 02/24/2018   Procedure: CORONARY STENT INTERVENTION;  Surgeon: Court Dorn PARAS, MD;  Location: [facility];  Service: Cardiovascular;  Laterality: N/A;   ESOPHAGOGASTRODUODENOSCOPY N/A 07/20/2015   Procedure: ESOPHAGOGASTRODUODENOSCOPY (EGD);  Surgeon: Claudis RAYMOND Rivet, MD;  Location: [facility];  Service: Endoscopy;  Laterality: N/A;  2:55 - moved to 1:55 - Ann notified pt   ESOPHAGOGASTRODUODENOSCOPY (EGD) WITH PROPOFOL  N/A 04/17/2022   Procedure: ESOPHAGOGASTRODUODENOSCOPY (EGD) WITH PROPOFOL ;  Surgeon: Eartha Angelia Sieving, MD;  Location: [facility];  Service: Gastroenterology;  Laterality: N/A;  215pm, asa 3   EYE SURGERY     LEFT HEART CATH AND CORONARY ANGIOGRAPHY N/A 02/24/2018   Procedure: LEFT HEART CATH AND CORONARY ANGIOGRAPHY;  Surgeon: Court Dorn PARAS, MD;  Location: [facility];  Service: Cardiovascular;  Laterality: N/A;   LUMBAR DISC SURGERY Left 2007   L4-5 ruptured discs   POLYPECTOMY  05/23/2016   Procedure: POLYPECTOMY;  Surgeon: Claudis RAYMOND Rivet, MD;  Location: [facility];  Service: Endoscopy;;  colon   POLYPECTOMY  06/28/2021   Procedure: POLYPECTOMY;  Surgeon: Rivet Claudis RAYMOND, MD;  Location: [facility];  Service: Endoscopy;;  splenic flexure;hepatic flexurex2; cecal polyp;transverse colon  polyp;rectal polyp;    Home Medications:  Allergies as of 04/07/2024       Reactions   Lisinopril Swelling   H/o of angioedema   Chlorthalidone  Other (See Comments)   Causes severe hyponatremia   Flexeril [cyclobenzaprine]    Didn't feel right   Hyoscyamine  Swelling   Prevacid [lansoprazole] Swelling   Valium [diazepam] Other (See Comments)   makes me feel crazy   Potassium-containing Compounds Palpitations        Medication List        Accurate as of April 07, 2024   8:29 AM. If you have any questions, ask your nurse or doctor.          acetaminophen  500 MG tablet Commonly known as: TYLENOL  Take 250 mg by mouth 2 (two) times daily as needed for moderate pain or headache.   ALPRAZolam  0.5 MG tablet Commonly known as: XANAX  Take 1 tablet (0.5 mg total) by mouth 3 (three) times daily as needed for anxiety.   amLODipine  5 MG tablet Commonly known as: NORVASC  Take 1 tablet (5 mg total) by mouth daily.   apixaban  5 MG Tabs tablet Commonly known as: ELIQUIS  Take 1 tablet (5 mg total) by mouth 2 (two) times daily.   aspirin  EC 81 MG tablet Take 1 tablet (81 mg total) by mouth daily. Swallow whole.   atorvastatin  80 MG tablet Commonly known as: LIPITOR  Take 1 tablet (80 mg total) by mouth daily.   lidocaine  2 % solution Commonly known as: XYLOCAINE  Use as directed 15 mLs in the mouth or throat as needed.   metoprolol  tartrate 25 MG tablet Commonly known as: LOPRESSOR  Take one tablet by mouth twice daily. You may take an extra 1/2 tablet to 1 tablet up to 2 times a day as needed for palpitations.   olmesartan  40 MG tablet Commonly known as: BENICAR  Take 1 tablet (40 mg total) by mouth daily.   pantoprazole  40 MG tablet Commonly known as: PROTONIX  Take 1 tablet (40 mg total) by mouth daily.        Allergies: Allergies[1]  Family History: Family History  Problem Relation Age of Onset   Hypertension Father    Hyperlipidemia Father    CAD Father        H/O CABG    Social History:  reports that he has never smoked. He has been exposed to tobacco smoke. He has never used smokeless tobacco. He reports that he does not drink alcohol  and does not use drugs.  ROS: All other review of systems were reviewed and are negative except what is noted above in HPI  Physical Exam: There were no vitals taken for this visit.  Constitutional:  Alert and oriented, No acute distress. HEENT: [place] AT, moist mucus membranes.  Trachea midline, no  masses. Cardiovascular: No clubbing, cyanosis, or edema. Respiratory: Normal respiratory effort, no increased work of breathing. GI: No inguinal hernias GU: Phallus is circumcised.  No lesions.  Meatus is normal.  Right testicle atrophic, left absent.  No inguinal hernias.  Normal anal sphincter tone.  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  I was unable to palpable the prostate.  There were no rectal masses noted. Lymph: No cervical or inguinal lymphadenopathy. Skin: No rashes, bruises or suspicious lesions. Neurologic: Grossly intact, no focal deficits, moving all 4 extremities. Psychiatric: Normal mood and affect.  Laboratory Data: Lab Results  Component Value Date   WBC 9.7 12/02/2023   HGB 12.9 (L) 12/02/2023   HCT 39.1 12/02/2023  MCV 91.1 12/02/2023   PLT 260 12/02/2023    Lab Results  Component Value Date   CREATININE 0.94 12/02/2023    No results found for: PSA  No results found for: TESTOSTERONE  Lab Results  Component Value Date   HGBA1C 6.3 (H) 10/26/2022   Urinalysis is clear  IPSS 9  Prior records reviewed  PSA reviewed   Pertinent Imaging: CT abdomen pelvis from October 2024 reviewed.  Results: No acute findings in the abdomen or pelvis.   Coronary artery disease, aortic atherosclerosis.  Assessment:  -Dysuria.  Relatively new onset.  Exam/urinalysis clear  - Screening for prostate cancer-79 year old male with last PSA a year and a half ago being 0.3.  No further screening necessary at this point  Plan:   -Patient reassured about his exam as well as his urinalysis  - I will send urine for culture  - Return as needed       [1]  Allergies Allergen Reactions   Lisinopril Swelling    H/o of angioedema    Chlorthalidone  Other (See Comments)    Causes severe hyponatremia   Flexeril [Cyclobenzaprine]     Didn't feel right   Hyoscyamine  Swelling   Prevacid [Lansoprazole] Swelling   Valium [Diazepam] Other (See Comments)    makes me  feel crazy   Potassium-Containing Compounds Palpitations   "

## 2024-04-09 LAB — URINE CULTURE: Organism ID, Bacteria: NO GROWTH

## 2024-04-15 ENCOUNTER — Ambulatory Visit: Payer: Self-pay

## 2024-04-15 NOTE — Telephone Encounter (Signed)
 Called pt and made him aware.  voiced understanding

## 2024-04-15 NOTE — Telephone Encounter (Signed)
-----   Message from Garnette Shack, MD sent at 04/13/2024  9:54 AM EST ----- Call pt--no UTI

## 2024-04-20 ENCOUNTER — Ambulatory Visit: Admitting: Urology

## 2024-05-26 ENCOUNTER — Ambulatory Visit: Admitting: Diagnostic Neuroimaging
